# Patient Record
Sex: Female | Born: 1945 | Race: White | Hispanic: No | Smoking: Never smoker
Health system: Southern US, Community
[De-identification: ages and names within clinical notes are randomized; demographics above are authoritative.]

## PROBLEM LIST (undated history)

## (undated) DIAGNOSIS — F419 Anxiety disorder, unspecified: Secondary | ICD-10-CM

## (undated) DIAGNOSIS — L989 Disorder of the skin and subcutaneous tissue, unspecified: Secondary | ICD-10-CM

## (undated) DIAGNOSIS — E785 Hyperlipidemia, unspecified: Secondary | ICD-10-CM

## (undated) DIAGNOSIS — I10 Essential (primary) hypertension: Secondary | ICD-10-CM

## (undated) DIAGNOSIS — Z803 Family history of malignant neoplasm of breast: Secondary | ICD-10-CM

## (undated) DIAGNOSIS — H269 Unspecified cataract: Secondary | ICD-10-CM

## (undated) DIAGNOSIS — T7840XA Allergy, unspecified, initial encounter: Secondary | ICD-10-CM

## (undated) DIAGNOSIS — M1990 Unspecified osteoarthritis, unspecified site: Secondary | ICD-10-CM

## (undated) DIAGNOSIS — Z8481 Family history of carrier of genetic disease: Secondary | ICD-10-CM

## (undated) DIAGNOSIS — M5134 Other intervertebral disc degeneration, thoracic region: Secondary | ICD-10-CM

## (undated) DIAGNOSIS — K219 Gastro-esophageal reflux disease without esophagitis: Secondary | ICD-10-CM

## (undated) DIAGNOSIS — E039 Hypothyroidism, unspecified: Secondary | ICD-10-CM

## (undated) DIAGNOSIS — J683 Other acute and subacute respiratory conditions due to chemicals, gases, fumes and vapors: Secondary | ICD-10-CM

## (undated) HISTORY — DX: Disorder of the skin and subcutaneous tissue, unspecified: L98.9

## (undated) HISTORY — DX: Other acute and subacute respiratory conditions due to chemicals, gases, fumes and vapors: J68.3

## (undated) HISTORY — DX: Unspecified osteoarthritis, unspecified site: M19.90

## (undated) HISTORY — PX: BREAST BIOPSY: SHX20

## (undated) HISTORY — DX: Anxiety disorder, unspecified: F41.9

## (undated) HISTORY — DX: Allergy, unspecified, initial encounter: T78.40XA

## (undated) HISTORY — DX: Hypothyroidism, unspecified: E03.9

## (undated) HISTORY — PX: OTHER SURGICAL HISTORY: SHX169

## (undated) HISTORY — DX: Family history of malignant neoplasm of breast: Z80.3

## (undated) HISTORY — PX: ABDOMINAL HYSTERECTOMY: SHX81

## (undated) HISTORY — DX: Gastro-esophageal reflux disease without esophagitis: K21.9

## (undated) HISTORY — DX: Family history of carrier of genetic disease: Z84.81

## (undated) HISTORY — DX: Unspecified cataract: H26.9

## (undated) HISTORY — DX: Essential (primary) hypertension: I10

## (undated) HISTORY — DX: Other intervertebral disc degeneration, thoracic region: M51.34

## (undated) HISTORY — DX: Hyperlipidemia, unspecified: E78.5

## (undated) HISTORY — PX: TONSILLECTOMY: SUR1361

---

## 1998-01-02 ENCOUNTER — Other Ambulatory Visit: Admission: RE | Admit: 1998-01-02 | Discharge: 1998-01-02 | Payer: Self-pay | Admitting: Obstetrics and Gynecology

## 1998-01-20 ENCOUNTER — Encounter: Payer: Self-pay | Admitting: Obstetrics and Gynecology

## 1998-01-22 ENCOUNTER — Observation Stay (HOSPITAL_COMMUNITY): Admission: RE | Admit: 1998-01-22 | Discharge: 1998-01-23 | Payer: Self-pay | Admitting: Obstetrics and Gynecology

## 1999-01-31 ENCOUNTER — Other Ambulatory Visit: Admission: RE | Admit: 1999-01-31 | Discharge: 1999-01-31 | Payer: Self-pay | Admitting: Obstetrics and Gynecology

## 2000-02-23 ENCOUNTER — Other Ambulatory Visit: Admission: RE | Admit: 2000-02-23 | Discharge: 2000-02-23 | Payer: Self-pay | Admitting: Obstetrics and Gynecology

## 2001-04-24 ENCOUNTER — Other Ambulatory Visit: Admission: RE | Admit: 2001-04-24 | Discharge: 2001-04-24 | Payer: Self-pay | Admitting: Internal Medicine

## 2003-05-18 ENCOUNTER — Encounter: Admission: RE | Admit: 2003-05-18 | Discharge: 2003-05-18 | Payer: Self-pay | Admitting: Obstetrics and Gynecology

## 2003-07-13 ENCOUNTER — Other Ambulatory Visit: Admission: RE | Admit: 2003-07-13 | Discharge: 2003-07-13 | Payer: Self-pay | Admitting: Obstetrics and Gynecology

## 2003-12-07 ENCOUNTER — Ambulatory Visit: Payer: Self-pay | Admitting: Family Medicine

## 2003-12-21 ENCOUNTER — Ambulatory Visit: Payer: Self-pay | Admitting: Family Medicine

## 2004-07-14 ENCOUNTER — Ambulatory Visit: Payer: Self-pay | Admitting: Family Medicine

## 2004-08-04 ENCOUNTER — Encounter: Admission: RE | Admit: 2004-08-04 | Discharge: 2004-08-04 | Payer: Self-pay | Admitting: Family Medicine

## 2004-08-16 ENCOUNTER — Ambulatory Visit: Payer: Self-pay | Admitting: Family Medicine

## 2004-11-16 ENCOUNTER — Ambulatory Visit: Payer: Self-pay | Admitting: Family Medicine

## 2004-12-13 ENCOUNTER — Ambulatory Visit: Payer: Self-pay | Admitting: Family Medicine

## 2005-03-14 ENCOUNTER — Ambulatory Visit: Payer: Self-pay | Admitting: Family Medicine

## 2005-05-12 ENCOUNTER — Ambulatory Visit: Payer: Self-pay | Admitting: Family Medicine

## 2005-05-26 ENCOUNTER — Ambulatory Visit: Payer: Self-pay | Admitting: Family Medicine

## 2005-11-23 ENCOUNTER — Ambulatory Visit: Payer: Self-pay | Admitting: Family Medicine

## 2005-12-05 ENCOUNTER — Encounter: Admission: RE | Admit: 2005-12-05 | Discharge: 2005-12-05 | Payer: Self-pay | Admitting: Obstetrics and Gynecology

## 2006-04-13 ENCOUNTER — Ambulatory Visit: Payer: Self-pay | Admitting: Family Medicine

## 2006-11-30 ENCOUNTER — Ambulatory Visit: Payer: Self-pay | Admitting: Family Medicine

## 2006-12-18 ENCOUNTER — Encounter: Admission: RE | Admit: 2006-12-18 | Discharge: 2006-12-18 | Payer: Self-pay | Admitting: Obstetrics and Gynecology

## 2007-02-08 ENCOUNTER — Encounter: Payer: Self-pay | Admitting: Family Medicine

## 2007-07-16 ENCOUNTER — Encounter (INDEPENDENT_AMBULATORY_CARE_PROVIDER_SITE_OTHER): Payer: Self-pay | Admitting: *Deleted

## 2007-10-10 ENCOUNTER — Telehealth (INDEPENDENT_AMBULATORY_CARE_PROVIDER_SITE_OTHER): Payer: Self-pay | Admitting: *Deleted

## 2007-11-11 ENCOUNTER — Telehealth: Payer: Self-pay | Admitting: Family Medicine

## 2007-12-19 ENCOUNTER — Encounter: Admission: RE | Admit: 2007-12-19 | Discharge: 2007-12-19 | Payer: Self-pay | Admitting: Obstetrics and Gynecology

## 2007-12-31 ENCOUNTER — Encounter: Admission: RE | Admit: 2007-12-31 | Discharge: 2007-12-31 | Payer: Self-pay | Admitting: Obstetrics and Gynecology

## 2008-01-07 ENCOUNTER — Ambulatory Visit: Payer: Self-pay | Admitting: Family Medicine

## 2008-01-07 DIAGNOSIS — I10 Essential (primary) hypertension: Secondary | ICD-10-CM | POA: Insufficient documentation

## 2008-01-07 DIAGNOSIS — E039 Hypothyroidism, unspecified: Secondary | ICD-10-CM

## 2008-01-07 DIAGNOSIS — E669 Obesity, unspecified: Secondary | ICD-10-CM

## 2008-01-07 DIAGNOSIS — M159 Polyosteoarthritis, unspecified: Secondary | ICD-10-CM

## 2008-01-08 LAB — CONVERTED CEMR LAB
AST: 19 units/L (ref 0–37)
Albumin: 3.7 g/dL (ref 3.5–5.2)
Alkaline Phosphatase: 99 units/L (ref 39–117)
Calcium: 9.6 mg/dL (ref 8.4–10.5)
Chloride: 103 meq/L (ref 96–112)
GFR calc Af Amer: 93 mL/min
HDL: 38.7 mg/dL — ABNORMAL LOW (ref >39.0)
LDL Cholesterol: 113 mg/dL — ABNORMAL HIGH (ref 0–99)
Phosphorus: 4 mg/dL (ref 2.3–4.6)
Total Bilirubin: 0.7 mg/dL (ref 0.3–1.2)
Total CHOL/HDL Ratio: 4.8
VLDL: 35 mg/dL (ref 0–40)

## 2008-02-13 ENCOUNTER — Telehealth: Payer: Self-pay | Admitting: Family Medicine

## 2008-02-25 ENCOUNTER — Encounter: Payer: Self-pay | Admitting: Family Medicine

## 2008-04-13 ENCOUNTER — Ambulatory Visit: Payer: Self-pay | Admitting: Family Medicine

## 2008-04-13 DIAGNOSIS — E785 Hyperlipidemia, unspecified: Secondary | ICD-10-CM | POA: Insufficient documentation

## 2008-07-30 ENCOUNTER — Encounter: Payer: Self-pay | Admitting: Family Medicine

## 2008-10-28 ENCOUNTER — Ambulatory Visit: Payer: Self-pay | Admitting: Family Medicine

## 2008-11-06 ENCOUNTER — Telehealth: Payer: Self-pay | Admitting: Family Medicine

## 2008-11-30 ENCOUNTER — Ambulatory Visit: Payer: Self-pay | Admitting: Family Medicine

## 2008-11-30 DIAGNOSIS — F411 Generalized anxiety disorder: Secondary | ICD-10-CM | POA: Insufficient documentation

## 2009-01-04 ENCOUNTER — Ambulatory Visit: Payer: Self-pay | Admitting: Family Medicine

## 2009-01-05 ENCOUNTER — Telehealth: Payer: Self-pay | Admitting: Family Medicine

## 2009-01-05 LAB — CONVERTED CEMR LAB
ALT: 24 U/L
AST: 20 U/L
Albumin: 3.5 g/dL
BUN: 14 mg/dL
CO2: 32 meq/L
Calcium: 9.3 mg/dL
Chloride: 101 meq/L
Cholesterol: 176 mg/dL
Creatinine, Ser: 0.9 mg/dL
GFR calc non Af Amer: 67 mL/min
Glucose, Bld: 107 mg/dL — ABNORMAL HIGH
HDL: 39.2 mg/dL
LDL Cholesterol: 105 mg/dL — ABNORMAL HIGH
Phosphorus: 2.9 mg/dL
Potassium: 3.2 meq/L — ABNORMAL LOW
Sodium: 140 meq/L
Total CHOL/HDL Ratio: 4
Triglycerides: 158 mg/dL — ABNORMAL HIGH
VLDL: 31.6 mg/dL

## 2009-01-11 ENCOUNTER — Telehealth: Payer: Self-pay | Admitting: Family Medicine

## 2009-01-25 ENCOUNTER — Ambulatory Visit: Payer: Self-pay | Admitting: Family Medicine

## 2009-01-25 LAB — CONVERTED CEMR LAB: Potassium: 3.9 meq/L (ref 3.5–5.1)

## 2009-02-04 ENCOUNTER — Telehealth: Payer: Self-pay | Admitting: Family Medicine

## 2009-02-26 ENCOUNTER — Encounter: Payer: Self-pay | Admitting: Family Medicine

## 2009-03-17 ENCOUNTER — Ambulatory Visit: Payer: Self-pay | Admitting: Family Medicine

## 2009-05-03 ENCOUNTER — Telehealth: Payer: Self-pay | Admitting: Family Medicine

## 2009-05-10 ENCOUNTER — Telehealth (INDEPENDENT_AMBULATORY_CARE_PROVIDER_SITE_OTHER): Payer: Self-pay

## 2009-05-10 ENCOUNTER — Ambulatory Visit: Payer: Self-pay | Admitting: Orthopedic Surgery

## 2009-05-11 ENCOUNTER — Ambulatory Visit: Payer: Self-pay | Admitting: Orthopedic Surgery

## 2009-05-16 HISTORY — PX: JOINT REPLACEMENT: SHX530

## 2009-05-18 ENCOUNTER — Inpatient Hospital Stay: Payer: Self-pay | Admitting: Orthopedic Surgery

## 2009-06-02 ENCOUNTER — Telehealth: Payer: Self-pay | Admitting: Family Medicine

## 2009-06-28 ENCOUNTER — Telehealth: Payer: Self-pay | Admitting: Family Medicine

## 2009-07-13 ENCOUNTER — Ambulatory Visit: Payer: Self-pay | Admitting: Family Medicine

## 2009-07-16 LAB — CONVERTED CEMR LAB
Albumin: 3.9 g/dL (ref 3.5–5.2)
BUN: 21 mg/dL (ref 6–23)
Calcium: 9.3 mg/dL (ref 8.4–10.5)
Chloride: 106 meq/L (ref 96–112)
Glucose, Bld: 160 mg/dL — ABNORMAL HIGH (ref 70–99)
Sodium: 142 meq/L (ref 135–145)

## 2009-07-29 ENCOUNTER — Telehealth: Payer: Self-pay | Admitting: Family Medicine

## 2009-07-30 ENCOUNTER — Ambulatory Visit: Payer: Self-pay | Admitting: Family Medicine

## 2009-07-30 DIAGNOSIS — R739 Hyperglycemia, unspecified: Secondary | ICD-10-CM

## 2009-08-02 LAB — CONVERTED CEMR LAB: Hgb A1c MFr Bld: 5.8 % (ref 4.6–6.5)

## 2009-09-06 ENCOUNTER — Encounter: Admission: RE | Admit: 2009-09-06 | Discharge: 2009-09-06 | Payer: Self-pay | Admitting: Obstetrics and Gynecology

## 2009-10-27 ENCOUNTER — Ambulatory Visit: Payer: Self-pay | Admitting: Family Medicine

## 2010-01-28 ENCOUNTER — Telehealth: Payer: Self-pay | Admitting: Family Medicine

## 2010-01-28 DIAGNOSIS — J309 Allergic rhinitis, unspecified: Secondary | ICD-10-CM | POA: Insufficient documentation

## 2010-02-15 NOTE — Assessment & Plan Note (Signed)
Summary: SURG CLEARANCE... CYD   Vital Signs:  Patient profile:   65 year old female Height:      67.75 inches Weight:      241.50 pounds BMI:     37.13 Temp:     97.6 degrees F oral Pulse rate:   92 / minute Pulse rhythm:   regular BP sitting:   130 / 82  (left arm) Cuff size:   large  Vitals Entered By: Delilah Shan CMA Duncan Dull) (March 17, 2009 11:12 AM) CC: Surgical clearance   History of Present Illness: here to get medical surgical clearance   getting ready for total knee repl in may  is worn out with arthritis overall   has had mult surge in past  no anethesia problems  no medicine allergies that she knows of   bp is stable - at 130/82 - no change in medicines   has hx of reactive airways -- as a child had asthma that resolved for the most part  now she only wheezes when she is sick  very rarely uses albuterol inhaler   no heart problems that she knows of   no bleeding problems or clotting disorders  gave blood in january -- no problems with that   ? if will get lab work at the hospital   no uri in past few weeks   recent thyroid check in jan was all ok - no changes      Allergies: No Known Drug Allergies  Past History:  Past Medical History: Last updated: 04/13/2008 hypothyroid (nodules and goiter) hyperlipidemia HTN OA GERD all rhinitis reactive airways  anxiety squamous cell skin lesions basal cell skin lesions mild hyperlipidemia   [facility]N [facility] - [facility]  GI- [facility]- [facility]   Past Surgical History: Last updated: 01/07/2008 tsa 1990s hyst with oophrectomy for B9 lesion/ cyst and prolapse recocele/cystocele repain 06US thyriod- multinodular goiter 7/06 colonosc neg (re check 5 year recommended) 06 heel dexa screen nl   Family History: Last updated: 01/07/2008 father - heart problem (not CAD) mother CVA, alz, HTN, depression sister HTN   Social History: Last updated: 01/07/2008 widowed (lost  husband to esophageal cancer) 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 Eyes:  Denies blurring and eye pain. CV:  Denies chest pain or discomfort and palpitations. Resp:  Denies cough and wheezing. GI:  Denies abdominal pain, bloody stools, and change in bowel habits. GU:  Denies dysuria and urinary frequency. MS:  Complains of joint pain and stiffness; denies joint redness, joint swelling, cramps, and muscle weakness. Derm:  Denies lesion(s), poor wound healing, and rash. Neuro:  Denies numbness and tingling. Psych:  Denies anxiety and depression.  Physical Exam  General:  overweight but generally well appearing  Head:  normocephalic and atraumatic.   Eyes:  vision grossly intact, pupils equal, pupils round, and pupils reactive to light.  no conjunctival pallor, injection or icterus  Mouth:  pharynx pink and moist.   Neck:  supple with full rom and no masses or thyromegally, no JVD or carotid bruit  Chest Wall:  No deformities, masses, or tenderness noted. Lungs:  Normal respiratory effort, chest expands symmetrically. Lungs are clear to auscultation, no crackles or wheezes. Heart:  Normal rate and regular rhythm. S1 and S2 normal without gallop, murmur, click, rub or other extra sounds. no wheeze even on forced  expiration  Abdomen:  Bowel sounds positive,abdomen soft and non-tender without masses, organomegaly or hernias noted. no renal bruits  Msk:  poor rom R knee with limp  no other acute joint changes  nl rom spine gait is unassisted  Pulses:  R and L carotid,radial,femoral,dorsalis pedis and posterior tibial pulses are full and equal bilaterally Extremities:  No clubbing, cyanosis, edema, or deformity noted with normal full range of motion of all joints.   Neurologic:  sensation intact to light touch, gait normal, and DTRs symmetrical and normal.   Skin:  Intact without  suspicious lesions or rashes Cervical Nodes:  No lymphadenopathy noted Psych:  normal affect, talkative and pleasant    Impression & Recommendations:  Problem # 1:  PRE-OPERATIVE CARDIOVASCULAR EXAMINATION (ICD-V72.81) Assessment New  no restrictions anticipated for surgery HTN is well controlled and no CV problems  remote hx of reactive airways - now only with illness (has albuterol mdi needs infrequ) no problems with anethesia or clotting and no med allergies  no acute change on EKG   Orders: EKG w/ Interpretation (93000)  Complete Medication List: 1)  Ny-tannic 9-25 Mg Tabs (Chlorphen tan-phenyleph tan) .... One by mouth two times a day 2)  Nexium 40 Mg Cpdr (Esomeprazole magnesium) .... One by mouth daily 3)  Triamterene-hctz 37.5-25 Mg Tabs (Triamterene-hctz) .... 2 tabs by mouth daily 4)  Tylenol Extra Strength 500 Mg Tabs (Acetaminophen) .... As needed 5)  Albuterol 90 Mcg/act Aers (Albuterol) .... 2 puffs q 4 hrs prn 6)  Flunisolide 0.025 % Soln (Flunisolide) .... One spray each nostril 1-2 times a day 7)  Calan Sr 240 Mg Tbcr (Verapamil hcl) .... Take one by mouth twice a day 8)  Synthroid 88 Mcg Tabs (Levothyroxine sodium) .... One tab by mouth daily 9)  Vivelle Dot Patch  .... Change patch 2 times weekly 10)  Glucosamine-chondroitin 750-600 Mg Tabs (Glucosamine-chondroitin) .... One by mouth two times a day 11)  Celebrex 200 Mg Caps (Celecoxib) .Marland Kitchen.. 1 by mouth once daily with food as needed pain 12)  Soma 350 Mg Tabs (Carisoprodol) .Marland Kitchen.. 1 by mouth three times a day as needed for muscle spasm  Other Orders: Prescription Created Electronically [phone])  Patient Instructions: 1)  no restrictions for upcoming surgery 2)  I will send notes to Dr Kennith Center  3)  good luck with everything  Prescriptions: FLUNISOLIDE 0.025 %  SOLN (FLUNISOLIDE) one spray each nostril 1-2 times a day  #25 [place] x 12   Entered by:   Delilah Shan CMA (AAMA)   Authorized by:   Judith Part  MD   Signed by:   Delilah Shan CMA (AAMA) on 03/17/2009   Method used:   [facility]        [facility] [facility]ail)       [address]       [place], [place]  [address]       Ph: [phone] or [phone]       Fax: [phone]   RxID:   [id number] ALBUTEROL 90 MCG/ACT  AERS (ALBUTEROL) 2 puffs q 4 hrs prn  #17 Gram x 1   Entered by:   Delilah Shan CMA (AAMA)   Authorized by:   Judith Part MD   Signed by:   Delilah Shan CMA (AAMA) on 03/17/2009   Method used:   Electronically [facility]        [facility] [phone] (retail)       [phone]       [place], [place]  [address]       Ph: [phone] or [phone]       Fax: [phone]   RxID:   [id number]   Current Allergies (reviewed today): No known allergies    EKG  Procedure date:  03/17/2009  Findings:      NSR with rate of 61 and no acute changes   computer read as poss anterior infarct-- I do not see this   Appended Document: SURG CLEARANCE... CYD Office visit note and EKG faxed to [facility] at [phone], attention Joni.

## 2010-02-15 NOTE — Progress Notes (Signed)
Summary: Rx R-Tanna  Phone Note Refill Request Call back at [phone] Message from:  [facility] on February 04, 2009 3:09 PM  Refills Requested: Medication #1:  NY-TANNIC 9-25 MG TABS one by mouth two times a day Received faxed refill request, please advise   Method Requested: Electronic Initial call taken by: Linde Gillis CMA Duncan Dull),  February 04, 2009 3:10 PM  Follow-up for Phone Call        px written on EMR for call in  Follow-up by: Judith Part MD,  February 04, 2009 3:15 PM  Additional Follow-up for Phone Call Additional follow up Details #1::        [facility] Additional Follow-up by: Lowella Petties CMA,  February 04, 2009 3:27 PM    New/Updated Medications: NY-TANNIC 9-25 MG TABS (CHLORPHEN TAN-PHENYLEPH TAN) one by mouth two times a day Prescriptions: NY-TANNIC 9-25 MG TABS (CHLORPHEN TAN-PHENYLEPH TAN) one by mouth two times a day  #60 x 5   Entered and Authorized by:   Judith Part MD   Signed by:   Judith Part MD on 02/04/2009   Method used:   Telephoned to ...       [facility] (retail)       [address]       [place], [place]  [address]       Ph: [phone] or [phone]       Fax: [phone]   RxID:   [id number]

## 2010-02-15 NOTE — Assessment & Plan Note (Signed)
Summary: FLU SHOT/CLE  Nurse Visit   Allergies: 1)  ! * Sporins(Neoporin, and Polysporin) 2)  ! * ? Tape (Not Sure What Type)  Orders Added: 1)  Admin 1st Vaccine [90471] 2)  Flu Vaccine 54yrs + [84132]  Flu Vaccine Consent Questions     Do you have a history of severe allergic reactions to this vaccine? no    Any prior history of allergic reactions to egg and/or gelatin? no    Do you have a sensitivity to the preservative Thimersol? no    Do you have a past history of Guillan-Barre Syndrome? no    Do you currently have an acute febrile illness? no    Have you ever had a severe reaction to latex? no    Vaccine information given and explained to patient? yes    Are you currently pregnant? no    Lot Number:[id number]   Exp Date:07/16/2010   Site Given  Left Deltoid IM

## 2010-02-15 NOTE — Progress Notes (Signed)
Summary: potassium   Phone Note Call from Patient Call back at Home Phone [phone]   Caller: Patient Call For: Judith Part MD Summary of Call: Patient says that she was given potassium suppliment after having her knee surgery. Dr. Kennith Center prescribed it to her, but will not refill it. He told her that she would have to go to PCP for refills. She is asking for a rx for potassium be sent [name]. She has an appt at end of June to see you, but does not want to wait until then, she sayst that she has leg cramps when she doesn't take it. Please advise.  Initial call taken by: Melody Comas,  Jun 02, 2009 1:23 PM  Follow-up for Phone Call        that is no problem- but I need to know name of med and dose so I can px   can give her #90 with 1 refil thanks  Follow-up by: Judith Part MD,  Jun 02, 2009 1:45 PM  Additional Follow-up for Phone Call Additional follow up Details #1::        Patient notified as instructed by telephone. Pt takes Klor Con M33meq taking one tablet daily. Pt wants sent to pharmacy today because she is completely out.Lewanda Rife LPN  Jun 02, 2009 4:47 PM   px written on EMR for call in  Additional Follow-up by: Judith Part MD,  Jun 02, 2009 5:05 PM    Additional Follow-up for Phone Call Additional follow up Details #2::    Patient notified as instructed by telephone. [facility] as instructed. Lewanda Rife LPN  Jun 02, 2009 5:15 PM   New/Updated Medications: KLOR-CON M20 20 MEQ CR-TABS (POTASSIUM CHLORIDE CRYS CR) 1 by mouth once daily Prescriptions: KLOR-CON M20 20 MEQ CR-TABS (POTASSIUM CHLORIDE CRYS CR) 1 by mouth once daily  #90 x 1   Entered and Authorized by:   Judith Part MD   Signed by:   Lewanda Rife LPN on 66/44/0347   Method used:   Telephoned to ...       [facility] (retail)       [address]       [place], [place]  [address]       Ph: [phone] or [phone]       Fax:  [phone]   RxID:   [id number]

## 2010-02-15 NOTE — Progress Notes (Signed)
Summary: Needs clearance  Phone Note From Other Clinic Call back at [phone]   Caller: Joni//Dr. Kennith Center' office Call For: Dr. Milinda Antis Summary of Call: Needs medical clearance faxed to their office fax # is [phone] Initial call taken by: Sydell Axon LPN,  May 10, 2009 2:38 PM  Follow-up for Phone Call        Spoke with Delaney Meigs at [facility] and she has Dr Royden Purl office note for surgical clearance.Lewanda Rife LPN  May 10, 2009 2:58 PM

## 2010-02-15 NOTE — Letter (Signed)
Summary: CMN for [facility]  CMN for [facility]   Imported By: Lanelle Bal 08/04/2009 13:26:16  _____________________________________________________________________  External Attachment:    Type:   Image     Comment:   External Document

## 2010-02-15 NOTE — Progress Notes (Signed)
Summary: Celebrex  Phone Note Refill Request Message from:  [facility] on June 28, 2009 12:17 PM  Refills Requested: Medication #1:  CELEBREX 200 MG CAPS 1 by mouth once daily with food as needed pain [facility]   Last Fill Date:  No date sent   Pharmacy Phone:  [phone]   Method Requested: Electronic Initial call taken by: Delilah Shan CMA Duncan Dull),  June 28, 2009 12:17 PM  Follow-up for Phone Call        px written on EMR for call in  Follow-up by: Judith Part MD,  June 28, 2009 1:19 PM    Prescriptions: CELEBREX 200 MG CAPS (CELECOXIB) 1 by mouth once daily with food as needed pain  #30 x 11   Entered by:   Delilah Shan CMA (AAMA)   Authorized by:   Judith Part MD   Signed by:   Delilah Shan CMA (AAMA) on 06/28/2009   Method used:   Electronically to        [facility] (retail)       [address]       [place], [place]  [address]       Ph: [phone] or [phone]       Fax: [phone]   RxID:   [id number]

## 2010-02-15 NOTE — Assessment & Plan Note (Signed)
Summary: JAN FOLLOW UP/[facility]   Vital Signs:  Patient profile:   65 year old female Weight:      241 pounds Temp:     97.8 degrees F oral Pulse rate:   76 / minute BP supine:   150 / 82 BP sitting:   128 / 80  Serial Vital Signs/Assessments:  Time      Position  BP       Pulse  Resp  Temp     By                     128/80                         Judith Part MD   History of Present Illness: here for f/u of mult health problems finally got back pain better  hip hurts now-- had injection -- needs another one for bursitis   is still walking -- putting off knee repl until abs necessary   lipids good wit trig 158/ HDL 39 and LDL 105 in dec  K was low at 3.2  started on kcl daily and this needs re check -- decided to start eating bananas   anxiety-- was better -- took buspar for a while and not now -- keeps it as needed   bp is up today 150/82-- does not check it at home  thinks eating out too much and too much salt  too much christmas eating   Allergies: No Known Drug Allergies  Past History:  Past Medical History: Last updated: 04/13/2008 hypothyroid (nodules and goiter) hyperlipidemia HTN OA GERD all rhinitis reactive airways  anxiety squamous cell skin lesions basal cell skin lesions mild hyperlipidemia   [facility]N [facility] - [facility]  GI- [facility]- [facility]   Past Surgical History: Last updated: 01/07/2008 tsa 1990s hyst with oophrectomy for B9 lesion/ cyst and prolapse recocele/cystocele repain 06US thyriod- multinodular goiter 7/06 colonosc neg (re check 5 year recommended) 06 heel dexa screen nl   Family History: Last updated: 01/07/2008 father - heart problem (not CAD) mother CVA, alz, HTN, depression sister HTN   Social History: Last updated: 01/07/2008 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 Eyes:  Denies blurring and eye pain. CV:  Denies chest pain or discomfort, lightheadness, palpitations, and shortness of breath with exertion. Resp:  Denies cough and wheezing. GI:  Denies abdominal pain, bloody stools, and change in bowel habits. MS:  Complains of joint pain and stiffness; denies muscle aches. Derm:  Denies itching, lesion(s), poor wound healing, and rash. Neuro:  Denies numbness and tingling. Psych:  mood is overall better- less anxioius. Endo:  Denies cold intolerance, excessive thirst, excessive urination, and heat intolerance. Heme:  Denies abnormal bruising and bleeding.  Physical Exam  General:  overweight but generally well appearing  Head:  normocephalic, atraumatic, and no abnormalities observed.   Eyes:  vision grossly intact, pupils equal, pupils round, and pupils reactive to light.  no conjunctival pallor, injection or icterus  Mouth:  pharynx pink and moist.   Neck:  No deformities, masses, or tenderness noted. nl rom- no bony tenderness some bilat trap tenderness  Lungs:  Normal respiratory effort, chest expands symmetrically. Lungs are clear to auscultation, no crackles or wheezes. Heart:  Normal rate  and regular rhythm. S1 and S2 normal without gallop, murmur, click, rub or other extra sounds. Msk:  No deformity or scoliosis noted of thoracic or lumbar spine.   Pulses:  R and L carotid,radial,femoral,dorsalis pedis and posterior tibial pulses are full and equal bilaterally Extremities:  No clubbing, cyanosis, edema, or deformity noted with normal full range of motion of all joints.   Neurologic:  sensation intact to light touch, gait normal, and DTRs symmetrical and normal.   Skin:  Intact without suspicious lesions or rashes Cervical Nodes:  No lymphadenopathy noted Psych:  normal affect, talkative and pleasant    Impression & Recommendations:  Problem # 1:  HYPOKALEMIA (ICD-276.8) Assessment New pt eating more K in  diet -- will check this and update with whether she will need px  no cramps or other symptoms Orders: Venipuncture (40981) TLB-Potassium (K+) (84132-K)  Problem # 2:  HYPERTENSION, BENIGN (ICD-401.1) Assessment: Unchanged  bp much better on second check with large cuff today disc lifestyle change in detail -- will work on wt loss  lab today f/u 6 mo  Her updated medication list for this problem includes:    Triamterene-hctz 37.5-25 Mg Tabs (Triamterene-hctz) .Marland Kitchen... 2 tabs by mouth daily    Calan Sr 240 Mg Tbcr (Verapamil hcl) .Marland Kitchen... Take one by mouth twice a day  BP today: 128/80 Prior BP: 142/86 (11/30/2008)  Labs Reviewed: K+: 3.2 (01/04/2009) Creat: : 0.9 (01/04/2009)   Chol: 176 (01/04/2009)   HDL: 39.20 (01/04/2009)   LDL: 105 (01/04/2009)   TG: 158.0 (01/04/2009)  Problem # 3:  HYPERLIPIDEMIA (ICD-272.4) Assessment: Unchanged fairly well controlled with diet - will continue to monitor  Labs Reviewed: SGOT: 20 (01/04/2009)   SGPT: 24 (01/04/2009)   HDL:39.20 (01/04/2009), 38.7 (01/07/2008)  LDL:105 (01/04/2009), 113 (01/07/2008)  Chol:176 (01/04/2009), 187 (01/07/2008)  Trig:158.0 (01/04/2009), 175 (01/07/2008)  Complete Medication List: 1)  Ny-tannic 9-25 Mg Tabs (Chlorphen tan-phenyleph tan) .... One by mouth two times a day 2)  Nexium 40 Mg Cpdr (Esomeprazole magnesium) .... One by mouth daily 3)  Triamterene-hctz 37.5-25 Mg Tabs (Triamterene-hctz) .... 2 tabs by mouth daily 4)  Tylenol Extra Strength 500 Mg Tabs (Acetaminophen) .... As needed 5)  Albuterol 90 Mcg/act Aers (Albuterol) .... 2 puffs q 4 hrs prn 6)  Flunisolide 0.025 % Soln (Flunisolide) .... One spray each nostril 1-2 times a day 7)  Calan Sr 240 Mg Tbcr (Verapamil hcl) .... Take one by mouth twice a day 8)  Synthroid 88 Mcg Tabs (Levothyroxine sodium) .... One tab by mouth daily 9)  Vivelle Dot Patch  .... Change patch 2 times weekly 10)  Glucosamine-chondroitin 750-600 Mg Tabs (Glucosamine-chondroitin)  .... One by mouth two times a day 11)  Celebrex 200 Mg Caps (Celecoxib) .Marland Kitchen.. 1 by mouth once daily with food as needed pain 12)  Buspirone Hcl 15 Mg Tabs (Buspirone hcl) .... 1/2 by mouth up to two times a day for anxiety 13)  Soma 350 Mg Tabs (Carisoprodol) .Marland Kitchen.. 1 by mouth three times a day as needed for muscle spasm  Patient Instructions: 1)  checking potassium today -- will advise you whether you need to start px potassium or not 2)  really work on healthy diet and exercise for weight loss  3)  watch salt in diet as well  4)  schedule follow up in  6months   Prevention & Chronic Care Immunizations   Influenza vaccine: Fluvax 3+  (10/28/2008)    Tetanus booster: Not documented  Pneumococcal vaccine: Not documented    H. zoster vaccine: Not documented  Colorectal Screening   Hemoccult: Not documented    Colonoscopy: Not documented  Other Screening   Pap smear: Not documented    Mammogram: Not documented    DXA bone density scan: Not documented   Smoking status: Not documented  Lipids   Total Cholesterol: 176  (01/04/2009)   LDL: 105  (01/04/2009)   LDL Direct: Not documented   HDL: 39.20  (01/04/2009)   Triglycerides: 158.0  (01/04/2009)    SGOT (AST): 20  (01/04/2009)   SGPT (ALT): 24  (01/04/2009)   Alkaline phosphatase: 99  (01/07/2008)   Total bilirubin: 0.7  (01/07/2008)  Hypertension   Last Blood Pressure: 128 / 80  (01/25/2009)   Serum creatinine: 0.9  (01/04/2009)   Serum potassium 3.2  (01/04/2009)  Self-Management Support :    Hypertension self-management support: Not documented    Lipid self-management support: Not documented

## 2010-02-15 NOTE — Progress Notes (Signed)
Summary: Celebrex  Phone Note Refill Request Message from:  [facility] on May 03, 2009 9:20 AM  Refills Requested: Medication #1:  CELEBREX 200 MG CAPS 1 by mouth once daily with food as needed pain [facility] [facility]   Last Fill Date:  No date sent   Pharmacy Phone:  [phone]  Patient was in for surgical clearance recently.......? RF.   Method Requested: Electronic Initial call taken by: Delilah Shan CMA Duncan Dull),  May 03, 2009 9:21 AM    Prescriptions: CELEBREX 200 MG CAPS (CELECOXIB) 1 by mouth once daily with food as needed pain  #30 x 0   Entered and Authorized by:   Shaune Leeks MD   Signed by:   Shaune Leeks MD on 05/03/2009   Method used:   Electronically to        [facility]  [place] (retail)       [address]       [place], [place]  [address]       Ph: [phone] or [phone]       Fax: [phone]   RxID:   [id number]

## 2010-02-15 NOTE — Progress Notes (Signed)
Summary: [facility] written confirmation verbal order for walker  Phone Note Other Incoming Call back at fax # [phone]   Caller: [facility] Summary of Call: [facility] sent written confirmation form for verbal orders for walker with wheels. Form is on your shelf in the in box. Initial call taken by: Lewanda Rife LPN,  July 29, 2009 2:48 PM  Follow-up for Phone Call        form done and in nurse in box  Follow-up by: Judith Part MD,  July 30, 2009 8:10 AM  Additional Follow-up for Phone Call Additional follow up Details #1::        completed form faxed to [phone] as instructed and then sent form to be scanned.Lewanda Rife LPN  July 30, 2009 8:19 AM

## 2010-02-17 NOTE — Progress Notes (Signed)
Summary: prior Berkley Harvey is needed for R-tanna/ denied  Phone Note From Pharmacy   Caller: [facility] [place] [facility] Summary of Call: Prior Berkley Harvey is needed for R-tanna, form is on your shelf. Initial call taken by: Lowella Petties CMA, AAMA,  January 28, 2010 9:09 AM  Follow-up for Phone Call        form done and in nurse in box  Follow-up by: Judith Part MD,  January 31, 2010 8:17 AM  Additional Follow-up for Phone Call Additional follow up Details #1::        Form faxed.           Lowella Petties CMA, AAMA  January 31, 2010 9:19 AM  Prior auth denied for [employer], letter is on your shelf.  please let pt know that her R tanna was denied for coverage since it is not "part D eligable" does she want to pay out of pocket and have me keep px it ?  she can check pricing with her pharmacy-thanks --MT Additional Follow-up by: Lowella Petties CMA, AAMA,  January 31, 2010 4:55 PM  New Problems: ALLERGIC RHINITIS (ICD-477.9)   Additional Follow-up for Phone Call Additional follow up Details #2::    Patient says she has had to pay for it out of pocket. She doesn't want to get back to where she was before she started it because it has done a great job. She asked if there was any way to get it approved and I advised that since it was not on her formulary-probably not. She said she may wait until her follow up with you and then try the prior auth again. I advised that it may or may not work. We would just have to see. She verbalized understanding. For now she asks that it still be prescribed.  ok- thanks -- she has refils until june I think Follow-up by: Janee Morn CMA Duncan Dull),  February 01, 2010 9:10 AM  New Problems: ALLERGIC RHINITIS (ICD-477.9)

## 2010-02-17 NOTE — Assessment & Plan Note (Signed)
Summary: 6 MONTH FOLLOWUPR/BH   Vital Signs:  Patient profile:   65 year old female Height:      67.75 inches Weight:      228.75 pounds BMI:     35.17 Temp:     98.4 degrees F oral Pulse rate:   84 / minute Pulse rhythm:   regular BP sitting:   138 / 82  (left arm) Cuff size:   large  Vitals Entered By: Lewanda Rife LPN (July 13, 2009 9:27 AM)  Serial Vital Signs/Assessments:  Time      Position  BP       Pulse  Resp  Temp     By                     132/80                         Judith Part MD  CC: six month f/u   History of Present Illness: here for f/u of HTN and lipids and hypothyroid   wt isdown 13 lb  got so sick she could not eat much -- now appetite coming back  gets full faster and eating less  also eating out less   has not been fully released to exercise- but doing some walking and finished therapy - after her knee replacement in may   needs K check  was having some cramps  was on K in the hospital -- and in pre op   bp 138/82 first check today  Last Lipid ProfileCholesterol: 176 (01/04/2009 9:28:19 AM)HDL:  39.20 (01/04/2009 9:28:19 AM)LDL:  105 (01/04/2009 9:28:19 AM)Triglycerides:  Last Liver profileSGOT:  20 (01/04/2009 9:28:19 AM)SPGT:  24 (01/04/2009 9:28:19 AM)T. Bili:  0.7 (01/07/2008 11:22:00 AM)Alk Phos:  99 (01/07/2008 11:22:00 AM)  that was with diet alone   Allergies (verified): 1)  ! * Sporins(Neoporin, and Polysporin) 2)  ! * ? Tape (Not Sure What Type)  Past History:  Past Medical History: Last updated: 06/03/2009 hypothyroid (nodules and goiter) hyperlipidemia HTN OA-- knee replacement  GERD all rhinitis reactive airways  anxiety squamous cell skin lesions basal cell skin lesions mild hyperlipidemia   [facility]N [facility] - [facility]  GI- [facility]- [facility]   Past Surgical History: Last updated: 06/03/2009 tsa 1990s hyst with oophrectomy for B9 lesion/ cyst and prolapse recocele/cystocele  repain 06US thyriod- multinodular goiter 7/06 colonosc neg (re check 5 year recommended) 06 heel dexa screen nl  5/11 L total knee replacement  Family History: Last updated: 01/07/2008 father - heart problem (not CAD) mother CVA, alz, HTN, depression sister HTN   Social History: Last updated: 01/07/2008 widowed (lost husband to esophageal cancer) 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 Eyes:  Denies blurring and eye irritation. CV:  Denies chest pain or discomfort, lightheadness, and palpitations. Resp:  Denies cough and wheezing. GI:  Denies abdominal pain, change in bowel habits, and indigestion. MS:  Complains of joint pain and stiffness; denies joint redness, joint swelling, and muscle aches. Derm:  Denies itching, lesion(s), poor wound healing, and rash. Neuro:  Denies numbness and tingling. Psych:  Denies anxiety and depression. Endo:  Denies excessive thirst and excessive urination. Heme:  Denies abnormal bruising and bleeding.  Physical Exam  General:  overweight but generally well appearing wt loss noted  Head:  normocephalic,  atraumatic, and no abnormalities observed.   Eyes:  vision grossly intact, pupils equal, pupils round, and pupils reactive to light.  no conjunctival pallor, injection or icterus  Mouth:  pharynx pink and moist.   Neck:  supple with full rom and no masses or thyromegally, no JVD or carotid bruit  Lungs:  Normal respiratory effort, chest expands symmetrically. Lungs are clear to auscultation, no crackles or wheezes. Heart:  Normal rate and regular rhythm. S1 and S2 normal without gallop, murmur, click, rub or other extra sounds. no wheeze even on forced expiration  Abdomen:  no renal  bruits  Msk:  healing incision L knee- with good rom   Pulses:  R and L carotid,radial,femoral,dorsalis pedis and posterior tibial pulses are full and equal  bilaterally Extremities:  No clubbing, cyanosis, edema, or deformity noted with normal full range of motion of all joints.   Neurologic:  sensation intact to light touch, gait normal, and DTRs symmetrical and normal.   Skin:  Intact without suspicious lesions or rashes Cervical Nodes:  No lymphadenopathy noted Psych:  normal affect, talkative and pleasant    Impression & Recommendations:  Problem # 1:  HYPERTENSION, BENIGN (ICD-401.1) Assessment Improved  good control - better with wt loss no change in meds meds refilled lab today f/u 6 mo  enc further wt loss  Her updated medication list for this problem includes:    Triamterene-hctz 37.5-25 Mg Tabs (Triamterene-hctz) .Marland Kitchen... 2 tabs by mouth daily    Calan Sr 240 Mg Tbcr (Verapamil hcl) .Marland Kitchen... Take one by mouth twice a day  Orders: Venipuncture ([address]) TLB-Renal Function Panel (80069-RENAL)  BP today: 138/82-- re check 130/80 Prior BP: 130/82 (03/17/2009)  Labs Reviewed: K+: 3.9 (01/25/2009) Creat: : 0.9 (01/04/2009)   Chol: 176 (01/04/2009)   HDL: 39.20 (01/04/2009)   LDL: 105 (01/04/2009)   TG: 158.0 (01/04/2009)  Problem # 2:  HYPOTHYROIDISM (ICD-244.9) Assessment: Unchanged per pt stable at endo this winter - no clinical changes  Her updated medication list for this problem includes:    Synthroid 88 Mcg Tabs (Levothyroxine sodium) ..... One tab by mouth daily  Complete Medication List: 1)  Ny-tannic 9-25 Mg Tabs (Chlorphen tan-phenyleph tan) .... One by mouth two times a day 2)  Nexium 40 Mg Cpdr (Esomeprazole magnesium) .... One by mouth daily 3)  Triamterene-hctz 37.5-25 Mg Tabs (Triamterene-hctz) .... 2 tabs by mouth daily 4)  Tylenol Extra Strength 500 Mg Tabs (Acetaminophen) .... As needed 5)  Albuterol 90 Mcg/act Aers (Albuterol) .... 2 puffs every 4 hrs as needed 6)  Calan Sr 240 Mg Tbcr (Verapamil hcl) .... Take one by mouth twice a day 7)  Synthroid 88 Mcg Tabs (Levothyroxine sodium) .... One tab by mouth  daily 8)  Vivelle Dot Patch  .... Change patch 2 times weekly 9)  Glucosamine-chondroitin 750-600 Mg Tabs (Glucosamine-chondroitin) .... One by mouth two times a day 10)  Celebrex 200 Mg Caps (Celecoxib) .Marland Kitchen.. 1 by mouth once daily with food as needed pain 11)  Soma 350 Mg Tabs (Carisoprodol) .Marland Kitchen.. 1 by mouth three times a day as needed for muscle spasm 12)  Klor-con M20 20 Meq Cr-tabs (Potassium chloride crys cr) .Marland Kitchen.. 1 by mouth once daily 13)  Fluticasone Propionate 50 Mcg/act Susp (Fluticasone propionate) .... One spray each nostril twice a day 14)  Multivitamins Tabs (Multiple vitamin) .... Take 1 tablet by mouth once a day  Patient Instructions: 1)  blood pressure is good  2)  work on continued weight  loss  3)  labs today  Prescriptions: NY-TANNIC 9-25 MG TABS (CHLORPHEN TAN-PHENYLEPH TAN) one by mouth two times a day  #60 x 11   Entered and Authorized by:   Judith Part MD   Signed by:   Judith Part MD on 07/13/2009   Method used:   [facility]        [facility]       [address]       [place], [place]  [address]       Ph: [phone] or [phone]       Fax: [phone]   RxID:   [id number] CALAN SR 240 MG  TBCR (VERAPAMIL HCL) take one by mouth twice a day  #60 x 11   Entered and Authorized by:   Judith Part MD   Signed by:   Judith Part MD on 07/13/2009   Method used:   [facility]        [facility] (retail)       [address]       [place], [place]  [address]       Ph: [phone] or [phone]       Fax: [phone]   RxID:   [id number] SOMA 350 MG TABS (CARISOPRODOL) 1 by mouth three times a day as needed for muscle spasm  #30 x 1   Entered and Authorized by:   Judith Part MD   Signed by:   Judith Part MD on 07/13/2009   Method used:   Print then Give to Patient   RxID:   [id number]   Current Allergies (reviewed today): ! * SPORINS(NEOPORIN, AND POLYSPORIN) ! *  ? TAPE (NOT SURE WHAT TYPE)

## 2010-03-11 ENCOUNTER — Encounter: Payer: Self-pay | Admitting: Family Medicine

## 2010-03-21 ENCOUNTER — Telehealth: Payer: Self-pay | Admitting: Family Medicine

## 2010-03-24 NOTE — Letter (Signed)
Summary: Dr.Sam Morayati Office Note  Dr.Sam Morayati Office Note   Imported By: Beau Fanny 03/15/2010 11:07:47  _____________________________________________________________________  External Attachment:    Type:   Image     Comment:   External Document

## 2010-03-29 NOTE — Progress Notes (Signed)
Summary: Fluticasone prop spray  Phone Note Refill Request Call back at [phone] Message from:  [name] on March 21, 2010 11:51 AM  Refills Requested: Medication #1:  FLUTICASONE PROPIONATE 50 MCG/ACT SUSP one spray each nostril twice a day   Last Refilled: 02/19/2010 [name] request refill on Fluticasone Prop spray Use 1 spary in each nostril 1/2 times daily Last refilled 02/19/10 but according to med list cannot find where Dr Milinda Antis has filled.Please advise.    Method Requested: Electronic Initial call taken by: Lewanda Rife LPN,  March 21, 2010 11:52 AM  Follow-up for Phone Call        px written on EMR for call in  Follow-up by: Judith Part MD,  March 21, 2010 12:43 PM  Additional Follow-up for Phone Call Additional follow up Details #1::        Med sent [facility] to [facility] as instructed.Lewanda Rife LPN  March 20, 1189 3:59 PM     Prescriptions: FLUTICASONE PROPIONATE 50 MCG/ACT SUSP (FLUTICASONE PROPIONATE) one spray each nostril twice a day  #1 mdi x 11   Entered by:   Lewanda Rife LPN   Authorized by:   Judith Part MD   Signed by:   Lewanda Rife LPN on 47/82/9562   Method used:   [facility]ronically to        [facility] (retail)       [address]       [place], [place]  [address]       Ph: [phone] or [phone]       Fax: [phone]   RxID:   [id number] FLUTICASONE PROPIONATE 50 MCG/ACT SUSP (FLUTICASONE PROPIONATE) one spray each nostril twice a day  #1 mdi x 11   Entered and Authorized by:   Judith Part MD   Signed by:   Judith Part MD on 03/21/2010   Method used:   Telephoned to ...       [facility] (retail)       [address]       [place], [place]  [address]       Ph: [phone] or [phone]       Fax: [phone]   RxID:   [id number]

## 2010-05-18 ENCOUNTER — Other Ambulatory Visit: Payer: Self-pay | Admitting: *Deleted

## 2010-05-18 MED ORDER — ESOMEPRAZOLE MAGNESIUM 40 MG PO CPDR: 40.0000 mg | DELAYED_RELEASE_CAPSULE | Freq: Every day | ORAL | Status: DC

## 2010-05-30 ENCOUNTER — Other Ambulatory Visit: Payer: Self-pay | Admitting: *Deleted

## 2010-05-30 MED ORDER — POTASSIUM CHLORIDE CRYS ER 20 MEQ PO TBCR: 20.0000 meq | EXTENDED_RELEASE_TABLET | Freq: Every day | ORAL | Status: DC

## 2010-06-17 ENCOUNTER — Other Ambulatory Visit: Payer: Self-pay

## 2010-06-17 MED ORDER — TRIAMTERENE-HCTZ 37.5-25 MG PO CAPS: 2.0000 | ORAL_CAPSULE | Freq: Every day | ORAL | Status: DC

## 2010-06-17 NOTE — Telephone Encounter (Signed)
Received fax request from [facility] for Celebrex 200mg . Pt last seen 07/13/09.Please advise.

## 2010-06-19 NOTE — Telephone Encounter (Signed)
See prev note

## 2010-06-19 NOTE — Telephone Encounter (Signed)
Please ask her if she is still taking this --- daily or prn?  If she is can have 30 with 5 ref  thanks

## 2010-06-20 ENCOUNTER — Other Ambulatory Visit: Payer: Self-pay | Admitting: *Deleted

## 2010-06-20 MED ORDER — CELECOXIB 200 MG PO CAPS: ORAL_CAPSULE | ORAL | Status: DC

## 2010-06-20 MED ORDER — CELECOXIB 200 MG PO CAPS: 200.0000 mg | ORAL_CAPSULE | Freq: Every day | ORAL | Status: DC | PRN

## 2010-06-20 MED ORDER — TRIAMTERENE-HCTZ 37.5-25 MG PO CAPS: 2.0000 | ORAL_CAPSULE | Freq: Every day | ORAL | Status: DC

## 2010-06-20 NOTE — Telephone Encounter (Signed)
Pt stated she is taking daily for pain. Dr Milinda Antis said can refill with #30 and 5 add' refills sent to [name] as instructed.

## 2010-06-20 NOTE — Telephone Encounter (Signed)
She does need appt with me if she does not already have one  Px written for call in

## 2010-06-24 ENCOUNTER — Telehealth: Payer: Self-pay | Admitting: *Deleted

## 2010-06-24 NOTE — Telephone Encounter (Signed)
Please let her know those are options for her

## 2010-06-24 NOTE — Telephone Encounter (Signed)
Patient notified as instructed by telephone. 

## 2010-06-24 NOTE — Telephone Encounter (Signed)
Pharmacist said OTC allegra,claritin,zyrtec or hydroxyzine.

## 2010-06-24 NOTE — Telephone Encounter (Signed)
Ask pharmacist what they recommend that they stock-thanks

## 2010-06-24 NOTE — Telephone Encounter (Signed)
Pharmacy is asking if there is something comparible to R-tanna that you can prescribe.  R-tanna is no longer covered by pt's insurance.

## 2010-06-27 ENCOUNTER — Encounter: Payer: Self-pay | Admitting: Family Medicine

## 2010-06-28 ENCOUNTER — Encounter: Payer: Self-pay | Admitting: Family Medicine

## 2010-06-28 ENCOUNTER — Ambulatory Visit (INDEPENDENT_AMBULATORY_CARE_PROVIDER_SITE_OTHER): Payer: No Typology Code available for payment source | Admitting: Family Medicine

## 2010-06-28 DIAGNOSIS — I10 Essential (primary) hypertension: Secondary | ICD-10-CM

## 2010-06-28 DIAGNOSIS — H9209 Otalgia, unspecified ear: Secondary | ICD-10-CM

## 2010-06-28 DIAGNOSIS — D649 Anemia, unspecified: Secondary | ICD-10-CM

## 2010-06-28 DIAGNOSIS — E039 Hypothyroidism, unspecified: Secondary | ICD-10-CM

## 2010-06-28 MED ORDER — TRIAMTERENE-HCTZ 37.5-25 MG PO CAPS: 2.0000 | ORAL_CAPSULE | Freq: Every day | ORAL | Status: DC

## 2010-06-28 NOTE — Assessment & Plan Note (Signed)
R ear discomfort and also throbbing sound - ongoing-- nl exam  ? tinnitis Ref to ENT

## 2010-06-28 NOTE — Assessment & Plan Note (Signed)
Per pt - endo f/u -- all is ok and no changes or symptoms

## 2010-06-28 NOTE — Assessment & Plan Note (Signed)
Good control refil maxzide Disc lifestyle  Did cut her K  Lab today

## 2010-06-28 NOTE — Assessment & Plan Note (Signed)
Found last few times don blood Check today Pt will sched own colonosc - is due for for fam hx

## 2010-06-28 NOTE — Patient Instructions (Signed)
Go ahead and make your colonoscopy appt since you are anemic  Keep tick bite clean with antibacterial soap and water  If increased redness or joint pain or fever/rash/ sore throat - call me asap  We will do ENT referral at check out  Labs today

## 2010-06-28 NOTE — Progress Notes (Signed)
Subjective:    Patient ID: Amber Miles, female    DOB: September 29, 1945, 65 y.o.   MRN: [id number]  HPI Foot was itching on sat night  R Was tick in between 3rd/ 4th toes  A little red- mostly skin colored   (brought tick - looks to be size of dog tick) ? If got it all out  Left a knot on top of foot - base of toe has gone down a bit  Pain over top of foot for a while  Is sore to touch  Also her R hip hurts  No fever  No rash  No sore throat  No headache  No other tick bites   Her dog got lyme disease 3-4 y ago   Still has throbbing sound in her R ear - wants ENT ref  ? Tinnitus or other    Cut her K in 1/2 -- this week - needs level checked Feels fine No cramps   Needs refil of maxzide - bp is good  No cramping No cp or ha or palpitations  Denied blood donation due to anemia 11.5, 11.7 -- usually gives twice per year  Last donation was 7/11 Is due for colonoscopy for fam hx- will call [facility] office when she gets home  Patient Active Problem List  Diagnoses  . HYPOTHYROIDISM  . HYPERLIPIDEMIA  . OBESITY  . ANXIETY  . HYPERTENSION, BENIGN  . OSTEOARTHRITIS, GENERALIZED  . HYPERGLYCEMIA  . ALLERGIC RHINITIS  . Ear pain  . Anemia   Past Medical History  Diagnosis Date  . Hypothyroid   . Hyperlipidemia   . Hypertension   . Arthritis     OA knee replacement  . GERD (gastroesophageal reflux disease)   . Allergy     allergic rhinitis  . Reactive airways dysfunction syndrome   . Anxiety   . Skin lesions, generalized     squamous cell skin lesions and basal cell skin lesions   Past Surgical History  Procedure Date  . Tsa   . Abdominal hysterectomy 1990s    hyst with oophrectomy for B9lesion/cyst and prolapse  . Recocele     and cystocele repair  . Joint replacement 05/2009    left total knee replacement   History  Substance Use Topics  . Smoking status: Never Smoker   . Smokeless tobacco: Not on file  . Alcohol Use: No   Family History  Problem  Relation Age of Onset  . Hypertension Mother   . Stroke Mother   . Depression Mother   . Alzheimer's disease Mother   . Heart disease Father   . Hypertension Sister    Allergies  Allergen Reactions  . Neosporin (Neomycin-Polymyx-Gramicid) Other (See Comments)    blisters   Current Outpatient Prescriptions on File Prior to Visit  Medication Sig Dispense Refill  . celecoxib (CELEBREX) 200 MG capsule Take one capsule by mouth every day as needed for pain with food.  30 capsule  1  . esomeprazole (NEXIUM) 40 MG capsule Take 1 capsule (40 mg total) by mouth daily before breakfast.  30 capsule  3  . Estradiol (VIVELLE-DOT TD) Change patch 2 times weekly       . fluticasone (FLONASE) 50 MCG/ACT nasal spray 2 sprays each nostril in AM and 1 spray each nostril in PM      . Glucosamine-Chondroitin 750-600 MG TABS Take 1 tablet by mouth 2 (two) times daily.        Marland Kitchen levothyroxine (SYNTHROID, LEVOTHROID)  88 MCG tablet Take 88 mcg by mouth daily.        . Multiple Vitamin (MULTIVITAMIN) tablet Take 1 tablet by mouth daily.        . verapamil (CALAN-SR) 240 MG CR tablet Take 240 mg by mouth 2 (two) times daily.        Marland Kitchen acetaminophen (TYLENOL) 500 MG tablet Take 500 mg by mouth every 6 (six) hours as needed.        Marland Kitchen albuterol (PROVENTIL,VENTOLIN) 90 MCG/ACT inhaler Inhale 2 puffs into the lungs every 4 (four) hours as needed.        . carisoprodol (SOMA) 350 MG tablet Take 350 mg by mouth 3 (three) times daily as needed.        . chlorpheniramine-phenylephrine (NY-TANNIC) 9-25 MG TABS Take 1 tablet by mouth 2 (two) times daily.                 Review of Systems Review of Systems  Constitutional: Negative for fever, appetite change, fatigue and unexpected weight change.  Eyes: Negative for pain and visual disturbance.  Respiratory: Negative for cough and shortness of breath.   Cardiovascular: Negative. For cp / palp  Gastrointestinal: Negative for nausea, diarrhea and constipation. and abd  pain and blood in stool Genitourinary: Negative for urgency and frequency.  Skin: Negative for pallor. pos for insect bite and itching  Neurological: Negative for weakness, light-headedness, numbness and headaches.  Hematological: Negative for adenopathy. Does not bruise/bleed easily.  Psychiatric/Behavioral: Negative for dysphoric mood. The patient is not nervous/anxious.          Objective:   Physical Exam  Constitutional: She appears well-developed and well-nourished. No distress.       Obese and well appearing   HENT:  Head: Normocephalic and atraumatic.  Right Ear: External ear normal.  Left Ear: External ear normal.  Nose: Nose normal.  Mouth/Throat: Oropharynx is clear and moist.  Eyes: Conjunctivae and EOM are normal. Pupils are equal, round, and reactive to light.  Neck: Normal range of motion. Neck supple. No JVD present. Carotid bruit is not present. No thyromegaly present.  Cardiovascular: Normal rate, regular rhythm and normal heart sounds.   Pulmonary/Chest: Effort normal and breath sounds normal. No respiratory distress. She has no wheezes.  Abdominal: Soft. Bowel sounds are normal. She exhibits no distension, no abdominal bruit and no mass. There is no tenderness.  Musculoskeletal: Normal range of motion. She exhibits no tenderness.       Mild edema on top of R foot - nt  Lymphadenopathy:    She has no cervical adenopathy.  Neurological: She is alert. She has normal reflexes. Coordination normal.  Skin: Skin is warm and dry. There is erythema. No pallor.       R foot - small 3 mm blister between 3rd and 4th toes Sterilely drained with scalpel/ dressed with bactroban No tick parts No redness No joint swelling  Mild soft tissue swelling on top of foot   Psychiatric: She has a normal mood and affect.          Assessment & Plan:

## 2010-06-29 LAB — COMPREHENSIVE METABOLIC PANEL
AST: 21 U/L (ref 0–37)
BUN: 25 mg/dL — ABNORMAL HIGH (ref 6–23)
Calcium: 9.4 mg/dL (ref 8.4–10.5)
Chloride: 105 mEq/L (ref 96–112)
GFR: 48.78 mL/min — ABNORMAL LOW (ref >60.00)

## 2010-06-29 LAB — CBC WITH DIFFERENTIAL/PLATELET
Basophils Absolute: 0.1 10*3/uL (ref 0.0–0.1)
Eosinophils Absolute: 0.3 10*3/uL (ref 0.0–0.7)
Eosinophils Relative: 4.2 % (ref 0.0–5.0)
Hemoglobin: 11.5 g/dL — ABNORMAL LOW (ref 12.0–15.0)
Lymphocytes Relative: 28.8 % (ref 12.0–46.0)
Monocytes Relative: 10.9 % (ref 3.0–12.0)
Neutro Abs: 4.5 10*3/uL (ref 1.4–7.7)
WBC: 8.3 10*3/uL (ref 4.5–10.5)

## 2010-06-30 ENCOUNTER — Telehealth: Payer: Self-pay

## 2010-06-30 NOTE — Telephone Encounter (Signed)
Dr Milinda Antis is this documentation satisfactory for you? Pt would not accept help getting colonoscopy appt and would not call back the date and time of appt after pt talks with Dr Kinnie Scales. Pt did say she would have Dr Kinnie Scales send results when colonoscopy done.Please advise.

## 2010-06-30 NOTE — Telephone Encounter (Signed)
That is fine with me-thanks  Please send his office copy of labs-thanks

## 2010-06-30 NOTE — Telephone Encounter (Signed)
Copy of labs and this note faxed to Dr Kinnie Scales [phone] as instructed.

## 2010-06-30 NOTE — Telephone Encounter (Signed)
Patient notified as instructed by telephone. Pt said she will contact Dr [facility] office and set up colonoscopy appt. I asked pt if she needed any help making appt and she said no she would do it. I asked pt if she would call me back to let Dr Milinda Antis know when the colonoscopy was scheduled. Pt said she would ask Dr Kinnie Scales to send results of colonscopy to Dr Milinda Antis.

## 2010-06-30 NOTE — Telephone Encounter (Signed)
Message copied by Patience Musca on Thu Jun 30, 2010  2:28 PM ------      Message from: Roxy Manns A      Created: Thu Jun 30, 2010 11:49 AM       Very mild anemia as expected with hb 11.5       Sugar slt high      Watch sugar in diet and work on wt loss       Make appt for your colonosc to eval the anemia -- if she needs ref please call

## 2010-07-05 ENCOUNTER — Ambulatory Visit: Payer: Self-pay | Admitting: Otolaryngology

## 2010-07-14 ENCOUNTER — Other Ambulatory Visit: Payer: Self-pay | Admitting: Family Medicine

## 2010-07-15 ENCOUNTER — Other Ambulatory Visit: Payer: Self-pay | Admitting: *Deleted

## 2010-07-25 ENCOUNTER — Ambulatory Visit: Payer: Self-pay | Admitting: Otolaryngology

## 2010-08-10 ENCOUNTER — Other Ambulatory Visit: Payer: Self-pay | Admitting: Family Medicine

## 2010-08-10 NOTE — Telephone Encounter (Signed)
[name] request refill on Celebrex 200mg . Please advise.

## 2010-08-10 NOTE — Telephone Encounter (Signed)
Will send electronically.

## 2010-08-13 ENCOUNTER — Other Ambulatory Visit: Payer: Self-pay | Admitting: Family Medicine

## 2010-08-16 ENCOUNTER — Other Ambulatory Visit: Payer: Self-pay | Admitting: Family Medicine

## 2010-08-19 ENCOUNTER — Ambulatory Visit: Payer: Self-pay | Admitting: Otolaryngology

## 2010-09-08 ENCOUNTER — Other Ambulatory Visit: Payer: Self-pay | Admitting: *Deleted

## 2010-09-08 MED ORDER — ESOMEPRAZOLE MAGNESIUM 40 MG PO CPDR: 40.0000 mg | DELAYED_RELEASE_CAPSULE | Freq: Every day | ORAL | Status: DC

## 2010-09-11 ENCOUNTER — Other Ambulatory Visit: Payer: Self-pay | Admitting: Family Medicine

## 2010-09-20 ENCOUNTER — Other Ambulatory Visit: Payer: Self-pay | Admitting: Obstetrics and Gynecology

## 2010-09-20 DIAGNOSIS — Z1231 Encounter for screening mammogram for malignant neoplasm of breast: Secondary | ICD-10-CM

## 2010-09-28 ENCOUNTER — Ambulatory Visit: Payer: Medicare Other

## 2010-10-05 ENCOUNTER — Ambulatory Visit (INDEPENDENT_AMBULATORY_CARE_PROVIDER_SITE_OTHER): Payer: No Typology Code available for payment source | Admitting: *Deleted

## 2010-10-05 DIAGNOSIS — Z23 Encounter for immunization: Secondary | ICD-10-CM

## 2010-10-05 DIAGNOSIS — Z2911 Encounter for prophylactic immunotherapy for respiratory syncytial virus (RSV): Secondary | ICD-10-CM

## 2010-10-06 ENCOUNTER — Other Ambulatory Visit: Payer: Self-pay | Admitting: Family Medicine

## 2010-10-06 MED ORDER — ESOMEPRAZOLE MAGNESIUM 40 MG PO CPDR: 40.0000 mg | DELAYED_RELEASE_CAPSULE | Freq: Every day | ORAL | Status: DC

## 2010-10-18 ENCOUNTER — Ambulatory Visit (INDEPENDENT_AMBULATORY_CARE_PROVIDER_SITE_OTHER): Payer: No Typology Code available for payment source | Admitting: Family Medicine

## 2010-10-18 ENCOUNTER — Encounter: Payer: Self-pay | Admitting: Family Medicine

## 2010-10-18 VITALS — BP 140/84 | HR 84 | Temp 98.0°F | Ht 67.75 in | Wt 238.8 lb

## 2010-10-18 DIAGNOSIS — H93A9 Pulsatile tinnitus, unspecified ear: Secondary | ICD-10-CM | POA: Insufficient documentation

## 2010-10-18 DIAGNOSIS — E039 Hypothyroidism, unspecified: Secondary | ICD-10-CM

## 2010-10-18 DIAGNOSIS — I10 Essential (primary) hypertension: Secondary | ICD-10-CM

## 2010-10-18 DIAGNOSIS — E785 Hyperlipidemia, unspecified: Secondary | ICD-10-CM

## 2010-10-18 DIAGNOSIS — Z23 Encounter for immunization: Secondary | ICD-10-CM

## 2010-10-18 DIAGNOSIS — H9319 Tinnitus, unspecified ear: Secondary | ICD-10-CM

## 2010-10-18 DIAGNOSIS — D649 Anemia, unspecified: Secondary | ICD-10-CM

## 2010-10-18 DIAGNOSIS — R7309 Other abnormal glucose: Secondary | ICD-10-CM

## 2010-10-18 LAB — CBC WITH DIFFERENTIAL/PLATELET
Basophils Relative: 0.9 % (ref 0.0–3.0)
Eosinophils Absolute: 0.2 10*3/uL (ref 0.0–0.7)
Eosinophils Relative: 2.9 % (ref 0.0–5.0)
Lymphocytes Relative: 30 % (ref 12.0–46.0)
Neutrophils Relative %: 57.8 % (ref 43.0–77.0)
Platelets: 338 10*3/uL (ref 150.0–400.0)
RBC: 5.02 Mil/uL (ref 3.87–5.11)
WBC: 5.3 10*3/uL (ref 4.5–10.5)

## 2010-10-18 LAB — COMPREHENSIVE METABOLIC PANEL
ALT: 23 U/L (ref 0–35)
AST: 26 U/L (ref 0–37)
Albumin: 3.9 g/dL (ref 3.5–5.2)
Alkaline Phosphatase: 83 U/L (ref 39–117)
Potassium: 4 mEq/L (ref 3.5–5.1)
Sodium: 142 mEq/L (ref 135–145)
Total Protein: 7.5 g/dL (ref 6.0–8.3)

## 2010-10-18 LAB — LIPID PANEL
HDL: 41.5 mg/dL (ref >39.00)
LDL Cholesterol: 99 mg/dL (ref 0–99)
VLDL: 37 mg/dL (ref 0.0–40.0)

## 2010-10-18 NOTE — Patient Instructions (Signed)
Labs today  Keep working on healthy diet and exercise  Flu shot today  Avoid red meat/ fried foods/ egg yolks/ fatty breakfast meats/ butter, cheese and high fat dairy/ and shellfish   Try to exercise 5 days per week  Schedule 30 minute check up in 6 months with labs prior

## 2010-10-18 NOTE — Assessment & Plan Note (Signed)
Check lipids today Disc low sat fat diet- was worse recently on vacation Disc goals for LDL - under 100  Exercise is good

## 2010-10-18 NOTE — Assessment & Plan Note (Signed)
Per pt has had full w/u from ENt and vascular- cannot find a cause Feels ok

## 2010-10-18 NOTE — Assessment & Plan Note (Signed)
Disc need for low glycemic diet and wt loss Did well until vacation and getting back on track now a1c today and adv F/u in 6 mo for check up

## 2010-10-18 NOTE — Assessment & Plan Note (Signed)
No med today- bp up a bit  Has been well controlled at home Some edema from her meds- tolerates that - suggested supp hose when traveling  Flu shot today Lab today F/u 6 mo

## 2010-10-18 NOTE — Progress Notes (Signed)
Subjective:    Patient ID: Amber Miles, female    DOB: 11-Jan-1946, 65 y.o.   MRN: [id number]  HPI Here for f/u of HTN and hypothyroid and lipids and hyperglycemia   HTN 140/84 today on dyazide and calan (has not taken any med this am )  No ha or palpitatoins or cp  Wt is down 2 lb with bmi of 36 Diet- was good but just on vacation (had prev lost about 6 lbs)   Is walking for exercise and also walking stairs   Hyperglycemia - last gluc 111 fasting in June Does not check this  Diet poor on vacation but finally getting back to normal   Hypothyroid-- no problems  No change in hair or skin   Last lipids in dec were well controlled   ? About flu shot  ? All to latex   Still hears heartbeat in her ears  Dr Andee Poles - found a meningioma in head - and watching that  Is driving her crazy - and not sure when she is following up with him again -- everything is back  Also saw vascular doc with carotids - normal   Patient Active Problem List  Diagnoses  . HYPOTHYROIDISM  . HYPERLIPIDEMIA  . OBESITY  . ANXIETY  . HYPERTENSION, BENIGN  . OSTEOARTHRITIS, GENERALIZED  . HYPERGLYCEMIA  . ALLERGIC RHINITIS  . Ear pain  . Anemia  . Pulsatile tinnitus   Past Medical History  Diagnosis Date  . Hypothyroid   . Hyperlipidemia   . Hypertension   . Arthritis     OA knee replacement  . GERD (gastroesophageal reflux disease)   . Allergy     allergic rhinitis  . Reactive airways dysfunction syndrome   . Anxiety   . Skin lesions, generalized     squamous cell skin lesions and basal cell skin lesions   Past Surgical History  Procedure Date  . Tsa   . Abdominal hysterectomy 1990s    hyst with oophrectomy for B9lesion/cyst and prolapse  . Recocele     and cystocele repair  . Joint replacement 05/2009    left total knee replacement   History  Substance Use Topics  . Smoking status: Never Smoker   . Smokeless tobacco: Not on file  . Alcohol Use: No   Family History  Problem  Relation Age of Onset  . Hypertension Mother   . Stroke Mother   . Depression Mother   . Alzheimer's disease Mother   . Heart disease Father   . Hypertension Sister    Allergies  Allergen Reactions  . Neosporin (Neomycin-Polymyx-Gramicid) Other (See Comments)    blisters  . Tape Swelling    Itching and redness Pt said severe reaction to surgical tape.? latex   Current Outpatient Prescriptions on File Prior to Visit  Medication Sig Dispense Refill  . CELEBREX 200 MG capsule TAKE ONE CAPSULE BY MOUTH EVERY DAY AS NEEDED FOR PAIN WITH FOOD.  30 capsule  5  . esomeprazole (NEXIUM) 40 MG capsule Take 1 capsule (40 mg total) by mouth daily before breakfast.  30 capsule  0  . Estradiol (VIVELLE-DOT TD) Change patch 2 times weekly       . fluticasone (FLONASE) 50 MCG/ACT nasal spray 2 sprays each nostril in AM and 1 spray each nostril in PM      . Glucosamine-Chondroitin 750-600 MG TABS Take 1 tablet by mouth 2 (two) times daily.        Marland Kitchen KLOR-CON M20  20 MEQ tablet TAKE 1 TABLET (20 MEQ TOTAL) BY MOUTH DAILY.  30 tablet  0  . levothyroxine (SYNTHROID, LEVOTHROID) 88 MCG tablet Take 88 mcg by mouth daily.        . Multiple Vitamin (MULTIVITAMIN) tablet Take 1 tablet by mouth daily.        . potassium chloride SA (K-DUR,KLOR-CON) 20 MEQ tablet Take 10 mEq by mouth daily.        Marland Kitchen triamterene-hydrochlorothiazide (DYAZIDE) 37.5-25 MG per capsule Take 2 capsules by mouth daily.  60 capsule  11  . verapamil (CALAN-SR) 240 MG CR tablet TAKE ONE BY MOUTH TWICE A DAY  90 tablet  0  . acetaminophen (TYLENOL) 500 MG tablet Take 500 mg by mouth every 6 (six) hours as needed.        Marland Kitchen albuterol (PROVENTIL,VENTOLIN) 90 MCG/ACT inhaler Inhale 2 puffs into the lungs every 4 (four) hours as needed.        . carisoprodol (SOMA) 350 MG tablet Take 350 mg by mouth 3 (three) times daily as needed.        . chlorpheniramine-phenylephrine (NY-TANNIC) 9-25 MG TABS Take 1 tablet by mouth 2 (two) times daily.                Review of Systems Review of Systems  Constitutional: Negative for fever, appetite change, fatigue and unexpected weight change.  Eyes: Negative for pain and visual disturbance.  ENT pos for tinnitus  Respiratory: Negative for cough and shortness of breath.   Cardiovascular: Negative for cp or palpitations    Gastrointestinal: Negative for nausea, diarrhea and constipation.  Genitourinary: Negative for urgency and frequency.  Skin: Negative for pallor or rash   Neurological: Negative for weakness, light-headedness, numbness and headaches.  Hematological: Negative for adenopathy. Does not bruise/bleed easily.  Psychiatric/Behavioral: Negative for dysphoric mood. The patient is not nervous/anxious.          Objective:   Physical Exam  Constitutional: She appears well-developed and well-nourished. No distress.       overwt and well appearing   HENT:  Head: Normocephalic and atraumatic.  Right Ear: External ear normal.  Left Ear: External ear normal.  Eyes: Conjunctivae and EOM are normal. Pupils are equal, round, and reactive to light.  Neck: Normal range of motion. Neck supple. No JVD present. Carotid bruit is not present. No thyromegaly present.  Cardiovascular: Normal rate, regular rhythm, normal heart sounds and intact distal pulses.  Exam reveals no gallop.   Pulmonary/Chest: Effort normal and breath sounds normal. No respiratory distress. She has no wheezes. She exhibits no tenderness.  Abdominal: Soft. Bowel sounds are normal. She exhibits no distension, no abdominal bruit and no mass. There is no tenderness.  Musculoskeletal: Normal range of motion. She exhibits no edema and no tenderness.  Lymphadenopathy:    She has no cervical adenopathy.  Neurological: She is alert. She has normal reflexes. No cranial nerve deficit. Coordination normal.  Skin: Skin is warm and dry. No rash noted. No erythema. No pallor.  Psychiatric: She has a normal mood and affect.           Assessment & Plan:

## 2010-10-18 NOTE — Assessment & Plan Note (Signed)
With goiter- cared for by Dr Ferdinand Lango Per pt doing very well

## 2010-10-18 NOTE — Assessment & Plan Note (Signed)
In past from blood donation Has had colonosc- reassuring  Cbc today

## 2010-10-20 ENCOUNTER — Ambulatory Visit: Payer: Medicare Other

## 2010-10-20 ENCOUNTER — Ambulatory Visit
Admission: RE | Admit: 2010-10-20 | Discharge: 2010-10-20 | Disposition: A | Discharge status: Home / Self Care | Payer: Medicare Other | Source: Ambulatory Visit | Attending: Obstetrics and Gynecology | Admitting: Obstetrics and Gynecology

## 2010-10-20 DIAGNOSIS — Z1231 Encounter for screening mammogram for malignant neoplasm of breast: Secondary | ICD-10-CM

## 2010-10-27 ENCOUNTER — Other Ambulatory Visit: Payer: Self-pay

## 2010-10-27 MED ORDER — CARISOPRODOL 350 MG PO TABS: 350.0000 mg | ORAL_TABLET | Freq: Three times a day (TID) | ORAL | Status: DC | PRN

## 2010-10-27 NOTE — Telephone Encounter (Signed)
Px written for call in   

## 2010-10-27 NOTE — Telephone Encounter (Signed)
Medication phoned to [facility] pharmacy as instructed.Patient notified as instructed by telephone.

## 2010-10-27 NOTE — Telephone Encounter (Signed)
[name] faxed refill request Carisoprodol 350 mg. Last filled 02/14/10. I spoke with pt and she said she could wait until Mon to get rx because she likes to have them on hand in case she needs them and pt will be going out of town on [place]. Pt will ck with pharmacy late Monday for refill and if rx not there she will call back on Tues morning as a reminder. Thank you.

## 2010-11-08 ENCOUNTER — Other Ambulatory Visit: Payer: Self-pay | Admitting: Family Medicine

## 2010-11-08 MED ORDER — POTASSIUM CHLORIDE CRYS ER 20 MEQ PO TBCR: EXTENDED_RELEASE_TABLET | ORAL | Status: DC

## 2010-11-08 MED ORDER — ESOMEPRAZOLE MAGNESIUM 40 MG PO CPDR: 40.0000 mg | DELAYED_RELEASE_CAPSULE | Freq: Every day | ORAL | Status: DC

## 2010-11-08 NOTE — Telephone Encounter (Signed)
[name] request refill on Maxzide 37.5-25 mg. Already refilled 06/2010

## 2010-11-10 ENCOUNTER — Other Ambulatory Visit: Payer: Self-pay | Admitting: Family Medicine

## 2010-11-11 ENCOUNTER — Other Ambulatory Visit: Payer: Self-pay | Admitting: *Deleted

## 2010-12-12 ENCOUNTER — Other Ambulatory Visit: Payer: Self-pay | Admitting: Family Medicine

## 2010-12-12 NOTE — Telephone Encounter (Signed)
Px written for call in  F/u if not improved  

## 2010-12-12 NOTE — Telephone Encounter (Signed)
[name] request refill on Proair Hfa.Pt request Albuterol inhaler. Pt said she has no fever and no sorethroat but does have laryngitis and has an asthmatic non productive cough that the albuterol is helping. Pt said she had wheezing and tightness in chest before using Albuterol.Please advise./

## 2010-12-13 ENCOUNTER — Other Ambulatory Visit: Payer: Self-pay | Admitting: Family Medicine

## 2010-12-13 NOTE — Telephone Encounter (Signed)
Rx called in as directed and patient notified to follow up if no improvement. She verbalized understanding.

## 2011-01-25 ENCOUNTER — Ambulatory Visit: Payer: Self-pay | Admitting: Neurosurgery

## 2011-01-25 DIAGNOSIS — D321 Benign neoplasm of spinal meninges: Secondary | ICD-10-CM | POA: Diagnosis not present

## 2011-01-25 DIAGNOSIS — D320 Benign neoplasm of cerebral meninges: Secondary | ICD-10-CM | POA: Diagnosis not present

## 2011-01-25 DIAGNOSIS — Z1389 Encounter for screening for other disorder: Secondary | ICD-10-CM | POA: Diagnosis not present

## 2011-01-25 LAB — CREATININE, SERUM
Creatinine: 0.96 mg/dL (ref 0.60–1.30)
EGFR (Non-African Amer.): >60

## 2011-02-07 ENCOUNTER — Other Ambulatory Visit: Payer: Self-pay | Admitting: *Deleted

## 2011-02-07 MED ORDER — CELECOXIB 200 MG PO CAPS: 200.0000 mg | ORAL_CAPSULE | Freq: Every day | ORAL | Status: DC

## 2011-02-07 NOTE — Telephone Encounter (Signed)
Will refill electronically  

## 2011-03-03 DIAGNOSIS — E042 Nontoxic multinodular goiter: Secondary | ICD-10-CM | POA: Diagnosis not present

## 2011-03-03 DIAGNOSIS — J45909 Unspecified asthma, uncomplicated: Secondary | ICD-10-CM | POA: Diagnosis not present

## 2011-03-03 DIAGNOSIS — E890 Postprocedural hypothyroidism: Secondary | ICD-10-CM | POA: Diagnosis not present

## 2011-03-03 DIAGNOSIS — I10 Essential (primary) hypertension: Secondary | ICD-10-CM | POA: Diagnosis not present

## 2011-03-03 DIAGNOSIS — K219 Gastro-esophageal reflux disease without esophagitis: Secondary | ICD-10-CM | POA: Diagnosis not present

## 2011-03-03 DIAGNOSIS — E041 Nontoxic single thyroid nodule: Secondary | ICD-10-CM | POA: Diagnosis not present

## 2011-03-03 DIAGNOSIS — E039 Hypothyroidism, unspecified: Secondary | ICD-10-CM | POA: Diagnosis not present

## 2011-03-03 DIAGNOSIS — D34 Benign neoplasm of thyroid gland: Secondary | ICD-10-CM | POA: Diagnosis not present

## 2011-03-03 DIAGNOSIS — T382X5A Adverse effect of antithyroid drugs, initial encounter: Secondary | ICD-10-CM | POA: Diagnosis not present

## 2011-03-07 DIAGNOSIS — E039 Hypothyroidism, unspecified: Secondary | ICD-10-CM | POA: Diagnosis not present

## 2011-03-07 DIAGNOSIS — J45909 Unspecified asthma, uncomplicated: Secondary | ICD-10-CM | POA: Diagnosis not present

## 2011-03-07 DIAGNOSIS — T382X5A Adverse effect of antithyroid drugs, initial encounter: Secondary | ICD-10-CM | POA: Diagnosis not present

## 2011-03-07 DIAGNOSIS — D34 Benign neoplasm of thyroid gland: Secondary | ICD-10-CM | POA: Diagnosis not present

## 2011-03-07 DIAGNOSIS — E041 Nontoxic single thyroid nodule: Secondary | ICD-10-CM | POA: Diagnosis not present

## 2011-03-07 DIAGNOSIS — N959 Unspecified menopausal and perimenopausal disorder: Secondary | ICD-10-CM | POA: Diagnosis not present

## 2011-03-07 DIAGNOSIS — I10 Essential (primary) hypertension: Secondary | ICD-10-CM | POA: Diagnosis not present

## 2011-03-07 DIAGNOSIS — E890 Postprocedural hypothyroidism: Secondary | ICD-10-CM | POA: Diagnosis not present

## 2011-03-07 DIAGNOSIS — E0520 Thyrotoxicosis with toxic multinodular goiter without thyrotoxic crisis or storm: Secondary | ICD-10-CM | POA: Diagnosis not present

## 2011-03-07 DIAGNOSIS — K219 Gastro-esophageal reflux disease without esophagitis: Secondary | ICD-10-CM | POA: Diagnosis not present

## 2011-03-07 DIAGNOSIS — E042 Nontoxic multinodular goiter: Secondary | ICD-10-CM | POA: Diagnosis not present

## 2011-03-13 ENCOUNTER — Other Ambulatory Visit: Payer: Self-pay | Admitting: Family Medicine

## 2011-04-07 ENCOUNTER — Other Ambulatory Visit: Payer: Self-pay | Admitting: *Deleted

## 2011-04-07 NOTE — Telephone Encounter (Signed)
Faxed request from [facility], last filled date not given.

## 2011-04-09 MED ORDER — CARISOPRODOL 350 MG PO TABS: 350.0000 mg | ORAL_TABLET | Freq: Three times a day (TID) | ORAL | Status: DC | PRN

## 2011-04-09 NOTE — Telephone Encounter (Signed)
Sent!

## 2011-04-10 DIAGNOSIS — M9981 Other biomechanical lesions of cervical region: Secondary | ICD-10-CM | POA: Diagnosis not present

## 2011-04-10 DIAGNOSIS — M542 Cervicalgia: Secondary | ICD-10-CM | POA: Diagnosis not present

## 2011-04-10 DIAGNOSIS — M999 Biomechanical lesion, unspecified: Secondary | ICD-10-CM | POA: Diagnosis not present

## 2011-04-13 DIAGNOSIS — M9981 Other biomechanical lesions of cervical region: Secondary | ICD-10-CM | POA: Diagnosis not present

## 2011-04-13 DIAGNOSIS — M999 Biomechanical lesion, unspecified: Secondary | ICD-10-CM | POA: Diagnosis not present

## 2011-04-13 DIAGNOSIS — M542 Cervicalgia: Secondary | ICD-10-CM | POA: Diagnosis not present

## 2011-04-18 ENCOUNTER — Ambulatory Visit: Payer: Medicare Other | Admitting: Family Medicine

## 2011-04-24 DIAGNOSIS — M722 Plantar fascial fibromatosis: Secondary | ICD-10-CM | POA: Diagnosis not present

## 2011-05-02 DIAGNOSIS — M546 Pain in thoracic spine: Secondary | ICD-10-CM | POA: Diagnosis not present

## 2011-05-02 DIAGNOSIS — M999 Biomechanical lesion, unspecified: Secondary | ICD-10-CM | POA: Diagnosis not present

## 2011-05-04 DIAGNOSIS — H40019 Open angle with borderline findings, low risk, unspecified eye: Secondary | ICD-10-CM | POA: Diagnosis not present

## 2011-05-04 DIAGNOSIS — Z0100 Encounter for examination of eyes and vision without abnormal findings: Secondary | ICD-10-CM | POA: Diagnosis not present

## 2011-05-23 ENCOUNTER — Telehealth: Payer: Self-pay | Admitting: Family Medicine

## 2011-05-23 NOTE — Telephone Encounter (Signed)
Patient received a no show charge for date of service 04/18/11.  Patient said when the teleminder called to remind her of the appointment, she cancelled the appointment.

## 2011-05-29 DIAGNOSIS — B351 Tinea unguium: Secondary | ICD-10-CM | POA: Diagnosis not present

## 2011-05-29 DIAGNOSIS — L608 Other nail disorders: Secondary | ICD-10-CM | POA: Diagnosis not present

## 2011-05-29 DIAGNOSIS — D485 Neoplasm of uncertain behavior of skin: Secondary | ICD-10-CM | POA: Diagnosis not present

## 2011-05-29 DIAGNOSIS — M722 Plantar fascial fibromatosis: Secondary | ICD-10-CM | POA: Diagnosis not present

## 2011-05-31 ENCOUNTER — Encounter: Payer: Self-pay | Admitting: Family Medicine

## 2011-05-31 ENCOUNTER — Ambulatory Visit (INDEPENDENT_AMBULATORY_CARE_PROVIDER_SITE_OTHER): Payer: Medicare Other | Admitting: Family Medicine

## 2011-05-31 VITALS — BP 138/80 | HR 60 | Temp 97.6°F | Ht 67.75 in | Wt 246.5 lb

## 2011-05-31 DIAGNOSIS — I10 Essential (primary) hypertension: Secondary | ICD-10-CM | POA: Diagnosis not present

## 2011-05-31 DIAGNOSIS — R7309 Other abnormal glucose: Secondary | ICD-10-CM | POA: Diagnosis not present

## 2011-05-31 DIAGNOSIS — E669 Obesity, unspecified: Secondary | ICD-10-CM

## 2011-05-31 MED ORDER — FLUTICASONE PROPIONATE 50 MCG/ACT NA SUSP: 2.0000 | Freq: Every day | NASAL | Status: DC

## 2011-05-31 NOTE — Patient Instructions (Addendum)
No change in medicines Start working on lower sugar diet  Eat more fresh fruits and vegetables and lean protein  Work on weight loss -- try to make a plan for exercise indoors or out - work up to 5 days per week  Blood pressure is ok  Follow up in 6 months for annual exam with labs prior

## 2011-05-31 NOTE — Assessment & Plan Note (Signed)
With last a1c under 6.5  Rev diet and need for wt loss in detail  Given 2 handouts on low glycemic diet  Not motivated to exercise- but disc opt for indoor and outdoor exercise also  F/u 6 mo  a1c today

## 2011-05-31 NOTE — Assessment & Plan Note (Addendum)
Discussed how this problem influences overall health and the risks it imposes  Reviewed plan for weight loss with lower calorie diet (via better food choices and also portion control or program like weight watchers) and exercise building up to or more than 30 minutes 5 days per week including some aerobic activity    Pt is overall not very motivated towards this, but I tried to stress importance to overall health

## 2011-05-31 NOTE — Assessment & Plan Note (Signed)
bp in fair control at this time  No changes needed  Disc lifstyle change with low sodium diet and exercise   Rev last labs  Disc exercise F/u 6 mo

## 2011-05-31 NOTE — Progress Notes (Signed)
Subjective:    Patient ID: Amber Miles, female    DOB: Aug 11, 1945, 66 y.o.   MRN: [id number]  HPI Here for f/u of chronic health conditions Is doing ok overall   Had a bad bout of plantar fasciitis - had a shot and boot to sleep in  Is doing better with that  Also knot of arthritis on top of her foot   Has meningioma in her brain -- has not grown and just being watched at this time (goes to [place])  Does not give her any symptoms  Was incidental finding  Still has pulsitle tinnitus  Sees ENT    bp is 138/80 today BP Readings from Last 3 Encounters:  05/31/11 138/80  10/18/10 140/84  06/28/10 130/72   bp is stable     Today No cp or palpitations or headaches or edema  No side effects to medicines    Wt is up 8 lb with bmi of 37 Diet- is fair   Exercise - not as much as she was  Due to wet weather , and also plantar fasciitis    Hyperglycemia Lab Results  Component Value Date   HGBA1C 6.1 10/18/2010   does drink sweet tea occasionally  She eats sweets now and then  Does eat some carbs    Hypothyroid - is about the same  Dr Judie Petit may radiate her again - feels about the same  Same dose of thyroid replacement     Chemistry      Component Value Date/Time   NA 142 10/18/2010 1018   K 4.0 10/18/2010 1018   CL 104 10/18/2010 1018   CO2 31 10/18/2010 1018   BUN 15 10/18/2010 1018   CREATININE 1.0 10/18/2010 1018      Component Value Date/Time   CALCIUM 9.3 10/18/2010 1018   ALKPHOS 83 10/18/2010 1018   AST 26 10/18/2010 1018   ALT 23 10/18/2010 1018   BILITOT 0.6 10/18/2010 1018      Lab Results  Component Value Date   CHOL 177 10/18/2010   HDL 41.50 10/18/2010   LDLCALC 99 10/18/2010   TRIG 185.0* 10/18/2010   CHOLHDL 4 10/18/2010    Patient Active Problem List  Diagnoses  . HYPOTHYROIDISM  . HYPERLIPIDEMIA  . OBESITY  . ANXIETY  . HYPERTENSION, BENIGN  . OSTEOARTHRITIS, GENERALIZED  . HYPERGLYCEMIA  . ALLERGIC RHINITIS  . Anemia  . Pulsatile tinnitus    Past Medical History  Diagnosis Date  . Hypothyroid   . Hyperlipidemia   . Hypertension   . Arthritis     OA knee replacement  . GERD (gastroesophageal reflux disease)   . Allergy     allergic rhinitis  . Reactive airways dysfunction syndrome   . Anxiety   . Skin lesions, generalized     squamous cell skin lesions and basal cell skin lesions   Past Surgical History  Procedure Date  . Tsa   . Abdominal hysterectomy 1990s    hyst with oophrectomy for B9lesion/cyst and prolapse  . Recocele     and cystocele repair  . Joint replacement 05/2009    left total knee replacement   History  Substance Use Topics  . Smoking status: Never Smoker   . Smokeless tobacco: Not on file  . Alcohol Use: No   Family History  Problem Relation Age of Onset  . Hypertension Mother   . Stroke Mother   . Depression Mother   . Alzheimer's disease Mother   .  Heart disease Father   . Hypertension Sister    Allergies  Allergen Reactions  . Neosporin (Neomycin-Polymyxin-Gramicidin) Other (See Comments)    blisters  . Tape Swelling    Itching and redness Pt said severe reaction to surgical tape.? latex   Current Outpatient Prescriptions on File Prior to Visit  Medication Sig Dispense Refill  . acetaminophen (TYLENOL) 500 MG tablet Take 500 mg by mouth every 6 (six) hours as needed.        Marland Kitchen albuterol (PROVENTIL,VENTOLIN) 90 MCG/ACT inhaler Inhale 2 puffs into the lungs every 4 (four) hours as needed.        . carisoprodol (SOMA) 350 MG tablet Take 1 tablet (350 mg total) by mouth 3 (three) times daily as needed.  90 tablet  0  . celecoxib (CELEBREX) 200 MG capsule Take 1 capsule (200 mg total) by mouth daily. With food as needed  30 capsule  5  . esomeprazole (NEXIUM) 40 MG capsule Take 1 capsule (40 mg total) by mouth daily before breakfast.  30 capsule  11  . Estradiol (VIVELLE-DOT TD) Change patch 2 times weekly       . fluticasone (FLONASE) 50 MCG/ACT nasal spray Place 2 sprays into the  nose daily.  16 g  11  . Glucosamine-Chondroitin 750-600 MG TABS Take 1 tablet by mouth 2 (two) times daily.        Marland Kitchen levothyroxine (SYNTHROID, LEVOTHROID) 88 MCG tablet Take 88 mcg by mouth daily.        . Multiple Vitamin (MULTIVITAMIN) tablet Take 1 tablet by mouth daily.        . potassium chloride SA (KLOR-CON M20) 20 MEQ tablet Take one tablet by mouth daily.  30 tablet  11  . PROAIR HFA 108 (90 BASE) MCG/ACT inhaler USE 2 PUFFS EVERY 4 HOURS AS NEEDED  1 Inhaler  1  . triamterene-hydrochlorothiazide (MAXZIDE-25) 37.5-25 MG per tablet TAKE 2 TABLETS BY MOUTH ONCE A DAY  60 tablet  11  . verapamil (CALAN-SR) 240 MG CR tablet TAKE ONE BY MOUTH TWICE A DAY  90 tablet  0  . chlorpheniramine-phenylephrine (NY-TANNIC) 9-25 MG TABS Take 1 tablet by mouth 2 (two) times daily.            Review of Systems Review of Systems  Constitutional: Negative for fever, appetite change,and unexpected weight change. pos for fatigue  Eyes: Negative for pain and visual disturbance.  Respiratory: Negative for cough and shortness of breath.   Cardiovascular: Negative for cp or palpitations    Gastrointestinal: Negative for nausea, diarrhea and constipation.  Genitourinary: Negative for urgency and frequency.  Skin: Negative for pallor or rash   MSK pos for chronic joint pain from arthritis  Neurological: Negative for weakness, light-headedness, numbness and headaches.  Hematological: Negative for adenopathy. Does not bruise/bleed easily.  Psychiatric/Behavioral: Negative for dysphoric mood. The patient is not nervous/anxious.         Objective:   Physical Exam  Constitutional: She appears well-developed and well-nourished. No distress.       Obese and well appearing  HENT:  Head: Normocephalic and atraumatic.  Mouth/Throat: Oropharynx is clear and moist.  Eyes: Conjunctivae and EOM are normal. Pupils are equal, round, and reactive to light. No scleral icterus.  Neck: Normal range of motion. Neck  supple. No JVD present. Carotid bruit is not present. Erythema present. No thyromegaly present.  Cardiovascular: Normal rate, regular rhythm, normal heart sounds and intact distal pulses.  Exam reveals no gallop.  Pulmonary/Chest: Effort normal and breath sounds normal. No respiratory distress. She has no wheezes. She exhibits no tenderness.  Abdominal: Soft. Bowel sounds are normal. She exhibits no distension, no abdominal bruit and no mass. There is no tenderness.  Musculoskeletal: She exhibits no edema.  Lymphadenopathy:    She has no cervical adenopathy.  Neurological: She is alert. She has normal reflexes. No cranial nerve deficit. She exhibits normal muscle tone. Coordination normal.  Skin: Skin is warm and dry. No erythema. No pallor.  Psychiatric: She has a normal mood and affect.          Assessment & Plan:

## 2011-06-01 ENCOUNTER — Encounter: Payer: Self-pay | Admitting: *Deleted

## 2011-06-15 DIAGNOSIS — Z79899 Other long term (current) drug therapy: Secondary | ICD-10-CM | POA: Diagnosis not present

## 2011-06-16 DIAGNOSIS — Z79899 Other long term (current) drug therapy: Secondary | ICD-10-CM | POA: Diagnosis not present

## 2011-06-16 DIAGNOSIS — B351 Tinea unguium: Secondary | ICD-10-CM | POA: Diagnosis not present

## 2011-06-23 NOTE — Telephone Encounter (Signed)
Please send to charge correction/pro fee to remove no show charge.  Maybe need to remind staff these can go directly to you and you have ability to send these.  Thanks

## 2011-07-24 ENCOUNTER — Other Ambulatory Visit: Payer: Self-pay

## 2011-07-24 DIAGNOSIS — B351 Tinea unguium: Secondary | ICD-10-CM | POA: Diagnosis not present

## 2011-07-24 DIAGNOSIS — Z79899 Other long term (current) drug therapy: Secondary | ICD-10-CM | POA: Diagnosis not present

## 2011-07-24 MED ORDER — CELECOXIB 200 MG PO CAPS: 200.0000 mg | ORAL_CAPSULE | Freq: Every day | ORAL | Status: DC

## 2011-07-24 NOTE — Telephone Encounter (Signed)
Will refill electronically  

## 2011-07-24 NOTE — Telephone Encounter (Signed)
Ok to refill? Last OV was 05/31/11,

## 2011-08-11 DIAGNOSIS — M542 Cervicalgia: Secondary | ICD-10-CM | POA: Diagnosis not present

## 2011-08-11 DIAGNOSIS — M9981 Other biomechanical lesions of cervical region: Secondary | ICD-10-CM | POA: Diagnosis not present

## 2011-08-11 DIAGNOSIS — M546 Pain in thoracic spine: Secondary | ICD-10-CM | POA: Diagnosis not present

## 2011-08-11 DIAGNOSIS — M999 Biomechanical lesion, unspecified: Secondary | ICD-10-CM | POA: Diagnosis not present

## 2011-08-28 ENCOUNTER — Other Ambulatory Visit: Payer: Self-pay | Admitting: *Deleted

## 2011-08-28 ENCOUNTER — Ambulatory Visit: Payer: Self-pay | Admitting: Neurosurgery

## 2011-08-28 DIAGNOSIS — D332 Benign neoplasm of brain, unspecified: Secondary | ICD-10-CM | POA: Diagnosis not present

## 2011-08-28 DIAGNOSIS — D321 Benign neoplasm of spinal meninges: Secondary | ICD-10-CM | POA: Diagnosis not present

## 2011-08-28 LAB — CREATININE, SERUM
Creatinine: 1.03 mg/dL (ref 0.60–1.30)
EGFR (African American): >60
EGFR (Non-African Amer.): 57 — ABNORMAL LOW

## 2011-08-28 MED ORDER — CELECOXIB 200 MG PO CAPS: 200.0000 mg | ORAL_CAPSULE | Freq: Every day | ORAL | Status: DC

## 2011-09-04 DIAGNOSIS — D321 Benign neoplasm of spinal meninges: Secondary | ICD-10-CM | POA: Diagnosis not present

## 2011-09-22 ENCOUNTER — Other Ambulatory Visit: Payer: Self-pay

## 2011-09-22 MED ORDER — ALBUTEROL SULFATE HFA 108 (90 BASE) MCG/ACT IN AERS: 2.0000 | INHALATION_SPRAY | RESPIRATORY_TRACT | Status: DC | PRN

## 2011-09-22 NOTE — Telephone Encounter (Signed)
Refill request for Proair HFA 90 MCG. Last filled 01/12/11. Last OV 05/31/11. Ok to refill?

## 2011-09-22 NOTE — Telephone Encounter (Signed)
That is fine with 5 refils thanks

## 2011-09-25 ENCOUNTER — Other Ambulatory Visit: Payer: Self-pay | Admitting: Obstetrics and Gynecology

## 2011-09-25 DIAGNOSIS — Z1231 Encounter for screening mammogram for malignant neoplasm of breast: Secondary | ICD-10-CM

## 2011-10-23 ENCOUNTER — Ambulatory Visit
Admission: RE | Admit: 2011-10-23 | Discharge: 2011-10-23 | Disposition: A | Discharge status: Home / Self Care | Payer: Medicare Other | Source: Ambulatory Visit | Attending: Obstetrics and Gynecology | Admitting: Obstetrics and Gynecology

## 2011-10-23 DIAGNOSIS — Z1231 Encounter for screening mammogram for malignant neoplasm of breast: Secondary | ICD-10-CM | POA: Diagnosis not present

## 2011-10-24 ENCOUNTER — Other Ambulatory Visit: Payer: Self-pay | Admitting: *Deleted

## 2011-10-24 ENCOUNTER — Other Ambulatory Visit: Payer: Self-pay | Admitting: Family Medicine

## 2011-10-24 MED ORDER — ESOMEPRAZOLE MAGNESIUM 40 MG PO CPDR: 40.0000 mg | DELAYED_RELEASE_CAPSULE | Freq: Every day | ORAL | Status: DC

## 2011-10-24 MED ORDER — POTASSIUM CHLORIDE CRYS ER 20 MEQ PO TBCR: EXTENDED_RELEASE_TABLET | ORAL | Status: DC

## 2011-11-07 DIAGNOSIS — D485 Neoplasm of uncertain behavior of skin: Secondary | ICD-10-CM | POA: Diagnosis not present

## 2011-11-07 DIAGNOSIS — L851 Acquired keratosis [keratoderma] palmaris et plantaris: Secondary | ICD-10-CM | POA: Diagnosis not present

## 2011-11-07 DIAGNOSIS — Z85828 Personal history of other malignant neoplasm of skin: Secondary | ICD-10-CM | POA: Diagnosis not present

## 2011-11-07 DIAGNOSIS — L821 Other seborrheic keratosis: Secondary | ICD-10-CM | POA: Diagnosis not present

## 2011-11-07 DIAGNOSIS — L570 Actinic keratosis: Secondary | ICD-10-CM | POA: Diagnosis not present

## 2011-11-07 DIAGNOSIS — D239 Other benign neoplasm of skin, unspecified: Secondary | ICD-10-CM | POA: Diagnosis not present

## 2011-11-07 DIAGNOSIS — L738 Other specified follicular disorders: Secondary | ICD-10-CM | POA: Diagnosis not present

## 2011-11-07 DIAGNOSIS — L723 Sebaceous cyst: Secondary | ICD-10-CM | POA: Diagnosis not present

## 2011-11-13 DIAGNOSIS — M545 Low back pain: Secondary | ICD-10-CM | POA: Diagnosis not present

## 2011-11-13 DIAGNOSIS — M999 Biomechanical lesion, unspecified: Secondary | ICD-10-CM | POA: Diagnosis not present

## 2011-11-14 DIAGNOSIS — M545 Low back pain: Secondary | ICD-10-CM | POA: Diagnosis not present

## 2011-11-14 DIAGNOSIS — M999 Biomechanical lesion, unspecified: Secondary | ICD-10-CM | POA: Diagnosis not present

## 2011-11-16 DIAGNOSIS — M545 Low back pain: Secondary | ICD-10-CM | POA: Diagnosis not present

## 2011-11-16 DIAGNOSIS — M999 Biomechanical lesion, unspecified: Secondary | ICD-10-CM | POA: Diagnosis not present

## 2011-11-20 DIAGNOSIS — M999 Biomechanical lesion, unspecified: Secondary | ICD-10-CM | POA: Diagnosis not present

## 2011-11-20 DIAGNOSIS — M545 Low back pain: Secondary | ICD-10-CM | POA: Diagnosis not present

## 2011-11-22 DIAGNOSIS — M545 Low back pain: Secondary | ICD-10-CM | POA: Diagnosis not present

## 2011-11-22 DIAGNOSIS — M542 Cervicalgia: Secondary | ICD-10-CM | POA: Diagnosis not present

## 2011-11-22 DIAGNOSIS — M9981 Other biomechanical lesions of cervical region: Secondary | ICD-10-CM | POA: Diagnosis not present

## 2011-11-22 DIAGNOSIS — Z79899 Other long term (current) drug therapy: Secondary | ICD-10-CM | POA: Diagnosis not present

## 2011-11-22 DIAGNOSIS — M999 Biomechanical lesion, unspecified: Secondary | ICD-10-CM | POA: Diagnosis not present

## 2011-11-23 ENCOUNTER — Ambulatory Visit: Payer: Self-pay | Admitting: Allergy

## 2011-11-23 ENCOUNTER — Telehealth: Payer: Self-pay | Admitting: Family Medicine

## 2011-11-23 DIAGNOSIS — J45909 Unspecified asthma, uncomplicated: Secondary | ICD-10-CM | POA: Diagnosis not present

## 2011-11-23 DIAGNOSIS — D649 Anemia, unspecified: Secondary | ICD-10-CM

## 2011-11-23 DIAGNOSIS — I10 Essential (primary) hypertension: Secondary | ICD-10-CM

## 2011-11-23 DIAGNOSIS — J309 Allergic rhinitis, unspecified: Secondary | ICD-10-CM | POA: Diagnosis not present

## 2011-11-23 DIAGNOSIS — E039 Hypothyroidism, unspecified: Secondary | ICD-10-CM

## 2011-11-23 DIAGNOSIS — H1045 Other chronic allergic conjunctivitis: Secondary | ICD-10-CM | POA: Diagnosis not present

## 2011-11-23 DIAGNOSIS — R7309 Other abnormal glucose: Secondary | ICD-10-CM

## 2011-11-23 DIAGNOSIS — E785 Hyperlipidemia, unspecified: Secondary | ICD-10-CM

## 2011-11-23 NOTE — Telephone Encounter (Signed)
Message copied by Judy Pimple on Thu Nov 23, 2011  7:30 PM ------      Message from: Alvina Chou      Created: Thu Nov 23, 2011 11:35 AM      Regarding: lab orders for Friday, 11.8.13       Patient is scheduled for CPX labs, please order future labs, Thanks , Camelia Eng

## 2011-11-23 NOTE — Telephone Encounter (Signed)
Message copied by Judy Pimple on Thu Nov 23, 2011  7:25 PM ------      Message from: Baldomero Lamy      Created: Thu Nov 16, 2011  2:06 PM      Regarding: cpx labs Fri 11/24/11       Please order  future cpx labs for pt's upcomming lab appt.      Thanks      Rodney Booze

## 2011-11-24 ENCOUNTER — Other Ambulatory Visit (INDEPENDENT_AMBULATORY_CARE_PROVIDER_SITE_OTHER): Payer: Medicare Other

## 2011-11-24 ENCOUNTER — Other Ambulatory Visit: Payer: Medicare Other

## 2011-11-24 DIAGNOSIS — R7309 Other abnormal glucose: Secondary | ICD-10-CM | POA: Diagnosis not present

## 2011-11-24 DIAGNOSIS — E785 Hyperlipidemia, unspecified: Secondary | ICD-10-CM

## 2011-11-24 DIAGNOSIS — M999 Biomechanical lesion, unspecified: Secondary | ICD-10-CM | POA: Diagnosis not present

## 2011-11-24 DIAGNOSIS — Z23 Encounter for immunization: Secondary | ICD-10-CM | POA: Diagnosis not present

## 2011-11-24 DIAGNOSIS — I10 Essential (primary) hypertension: Secondary | ICD-10-CM

## 2011-11-24 DIAGNOSIS — D649 Anemia, unspecified: Secondary | ICD-10-CM | POA: Diagnosis not present

## 2011-11-24 DIAGNOSIS — E039 Hypothyroidism, unspecified: Secondary | ICD-10-CM

## 2011-11-24 DIAGNOSIS — M545 Low back pain: Secondary | ICD-10-CM | POA: Diagnosis not present

## 2011-11-24 DIAGNOSIS — M9981 Other biomechanical lesions of cervical region: Secondary | ICD-10-CM | POA: Diagnosis not present

## 2011-11-24 DIAGNOSIS — M542 Cervicalgia: Secondary | ICD-10-CM | POA: Diagnosis not present

## 2011-11-24 LAB — CBC WITH DIFFERENTIAL/PLATELET
Basophils Relative: 1 % (ref 0.0–3.0)
Eosinophils Relative: 2 % (ref 0.0–5.0)
Lymphocytes Relative: 17.5 % (ref 12.0–46.0)
MCV: 90.2 fl (ref 78.0–100.0)
Monocytes Relative: 6.8 % (ref 3.0–12.0)
Neutrophils Relative %: 72.7 % (ref 43.0–77.0)
RBC: 5.08 Mil/uL (ref 3.87–5.11)
WBC: 8.5 10*3/uL (ref 4.5–10.5)

## 2011-11-24 LAB — LIPID PANEL
HDL: 45.5 mg/dL (ref >39.00)
LDL Cholesterol: 114 mg/dL — ABNORMAL HIGH (ref 0–99)
Total CHOL/HDL Ratio: 4
VLDL: 27.8 mg/dL (ref 0.0–40.0)

## 2011-11-24 LAB — COMPREHENSIVE METABOLIC PANEL
Albumin: 3.9 g/dL (ref 3.5–5.2)
BUN: 23 mg/dL (ref 6–23)
CO2: 29 mEq/L (ref 19–32)
Calcium: 9.7 mg/dL (ref 8.4–10.5)
Chloride: 101 mEq/L (ref 96–112)
Glucose, Bld: 109 mg/dL — ABNORMAL HIGH (ref 70–99)
Potassium: 3.9 mEq/L (ref 3.5–5.1)

## 2011-11-24 LAB — HEMOGLOBIN A1C: Hgb A1c MFr Bld: 5.9 % (ref 4.6–6.5)

## 2011-12-04 ENCOUNTER — Ambulatory Visit (INDEPENDENT_AMBULATORY_CARE_PROVIDER_SITE_OTHER): Payer: Medicare Other | Admitting: Family Medicine

## 2011-12-04 ENCOUNTER — Encounter: Payer: Self-pay | Admitting: Family Medicine

## 2011-12-04 VITALS — BP 158/74 | HR 60 | Temp 97.6°F | Ht 68.0 in | Wt 245.8 lb

## 2011-12-04 DIAGNOSIS — E785 Hyperlipidemia, unspecified: Secondary | ICD-10-CM | POA: Diagnosis not present

## 2011-12-04 DIAGNOSIS — M542 Cervicalgia: Secondary | ICD-10-CM | POA: Diagnosis not present

## 2011-12-04 DIAGNOSIS — Z23 Encounter for immunization: Secondary | ICD-10-CM | POA: Diagnosis not present

## 2011-12-04 DIAGNOSIS — E039 Hypothyroidism, unspecified: Secondary | ICD-10-CM | POA: Diagnosis not present

## 2011-12-04 DIAGNOSIS — M999 Biomechanical lesion, unspecified: Secondary | ICD-10-CM | POA: Diagnosis not present

## 2011-12-04 DIAGNOSIS — M545 Low back pain: Secondary | ICD-10-CM | POA: Diagnosis not present

## 2011-12-04 DIAGNOSIS — I10 Essential (primary) hypertension: Secondary | ICD-10-CM | POA: Diagnosis not present

## 2011-12-04 DIAGNOSIS — E669 Obesity, unspecified: Secondary | ICD-10-CM

## 2011-12-04 DIAGNOSIS — R7309 Other abnormal glucose: Secondary | ICD-10-CM

## 2011-12-04 DIAGNOSIS — M9981 Other biomechanical lesions of cervical region: Secondary | ICD-10-CM | POA: Diagnosis not present

## 2011-12-04 MED ORDER — VERAPAMIL HCL ER 240 MG PO TBCR: 240.0000 mg | EXTENDED_RELEASE_TABLET | Freq: Two times a day (BID) | ORAL | Status: DC

## 2011-12-04 MED ORDER — ESOMEPRAZOLE MAGNESIUM 40 MG PO CPDR: 40.0000 mg | DELAYED_RELEASE_CAPSULE | Freq: Every day | ORAL | Status: DC

## 2011-12-04 MED ORDER — POTASSIUM CHLORIDE CRYS ER 20 MEQ PO TBCR: EXTENDED_RELEASE_TABLET | ORAL | Status: DC

## 2011-12-04 MED ORDER — CELECOXIB 200 MG PO CAPS: 200.0000 mg | ORAL_CAPSULE | Freq: Every day | ORAL | Status: DC

## 2011-12-04 MED ORDER — TRIAMTERENE-HCTZ 37.5-25 MG PO TABS: 2.0000 | ORAL_TABLET | Freq: Every day | ORAL | Status: DC

## 2011-12-04 NOTE — Assessment & Plan Note (Signed)
tsh is high Copy to Dr Judie Petit

## 2011-12-04 NOTE — Assessment & Plan Note (Signed)
Cholesterol up a bit Disc goals for lipids and reasons to control them Rev labs with pt Rev low sat fat diet in detail

## 2011-12-04 NOTE — Progress Notes (Signed)
Subjective:    Patient ID: Amber Miles, female    DOB: Oct 28, 1945, 66 y.o.   MRN: [id number]  HPI Here for check up of chronic medical conditions and to review health mt list   Doing well - and feeling pretty good  Just got back from a trip to [place] - really enjoyed it  A lot of tours and shows   Her back is doing better after seeing a chiropractor - also hip is doing better   Wt is down 1 lb with bmi of 37 Diet - is really trying to eat healthy Exercise- is able to walk some steps now , and doing some walking   bp is up today  No cp or palpitations or headaches or edema  No side effects to medicines  BP Readings from Last 3 Encounters:  12/04/11 158/74  05/31/11 138/80  10/18/10 140/84   ate a bit more salt than usual on vacation At home is in 130s80s   Hypothyroid Lab Results  Component Value Date   TSH 6.41* 11/24/2011   this is high- no missed doses  Will see Dr Patrecia Pace in feb  Is not feeling tired No skin or hair changes   singulair really helps her allergies   Hyperlipidemia Lab Results  Component Value Date   CHOL 187 11/24/2011   CHOL 177 10/18/2010   CHOL 176 01/04/2009   Lab Results  Component Value Date   HDL 45.50 11/24/2011   HDL 56.21 10/18/2010   HDL 30.86 01/04/2009   Lab Results  Component Value Date   LDLCALC 114* 11/24/2011   LDLCALC 99 10/18/2010   LDLCALC 105* 01/04/2009   Lab Results  Component Value Date   TRIG 139.0 11/24/2011   TRIG 185.0* 10/18/2010   TRIG 158.0* 01/04/2009   Lab Results  Component Value Date   CHOLHDL 4 11/24/2011   CHOLHDL 4 10/18/2010   CHOLHDL 4 01/04/2009   No results found for this basename: LDLDIRECT   diet - too much sat fat , her LDL is up a bit  No red meat and not a lot of fried stuff , but eats too much cheese   a1c is 5.9  -no DM but watching it  Not a big sweet eater Some sweet tea    Td- will get today  Pneumonia vaccine- will get today  Flu vaccine - had it this month   mammo  10/13 Self exam- no lumps or changes   Has had hyst  Still has go to to gyn ever 2 years- goes dec 30th   colonosc 7/12 -is up to date with a 5 year recall   Patient Active Problem List  Diagnosis  . HYPOTHYROIDISM  . HYPERLIPIDEMIA  . OBESITY  . ANXIETY  . HYPERTENSION, BENIGN  . OSTEOARTHRITIS, GENERALIZED  . HYPERGLYCEMIA  . ALLERGIC RHINITIS  . Pulsatile tinnitus   Past Medical History  Diagnosis Date  . Hypothyroid   . Hyperlipidemia   . Hypertension   . Arthritis     OA knee replacement  . GERD (gastroesophageal reflux disease)   . Allergy     allergic rhinitis  . Reactive airways dysfunction syndrome   . Anxiety   . Skin lesions, generalized     squamous cell skin lesions and basal cell skin lesions   Past Surgical History  Procedure Date  . Tsa   . Abdominal hysterectomy 1990s    hyst with oophrectomy for B9lesion/cyst and prolapse  . Recocele  and cystocele repair  . Joint replacement 05/2009    left total knee replacement   History  Substance Use Topics  . Smoking status: Never Smoker   . Smokeless tobacco: Not on file  . Alcohol Use: No   Family History  Problem Relation Age of Onset  . Hypertension Mother   . Stroke Mother   . Depression Mother   . Alzheimer's disease Mother   . Heart disease Father   . Hypertension Sister    Allergies  Allergen Reactions  . Neosporin (Neomycin-Polymyxin-Gramicidin) Other (See Comments)    blisters  . Tape Swelling    Itching and redness Pt said severe reaction to surgical tape.? latex   Current Outpatient Prescriptions on File Prior to Visit  Medication Sig Dispense Refill  . albuterol (PROAIR HFA) 108 (90 BASE) MCG/ACT inhaler Inhale 2 puffs into the lungs every 4 (four) hours as needed for wheezing.  1 Inhaler  5  . carisoprodol (SOMA) 350 MG tablet Take 1 tablet (350 mg total) by mouth 3 (three) times daily as needed.  90 tablet  0  . esomeprazole (NEXIUM) 40 MG capsule Take 1 capsule (40 mg  total) by mouth daily before breakfast.  30 capsule  11  . Estradiol (VIVELLE-DOT TD) Change patch 2 times weekly       . fluticasone (FLONASE) 50 MCG/ACT nasal spray Place 2 sprays into the nose daily.  16 g  11  . Glucosamine-Chondroitin 750-600 MG TABS Take 1 tablet by mouth 2 (two) times daily.        Marland Kitchen levothyroxine (SYNTHROID, LEVOTHROID) 88 MCG tablet Take 88 mcg by mouth daily.        . montelukast (SINGULAIR) 10 MG tablet Take 10 mg by mouth daily.       . Multiple Vitamin (MULTIVITAMIN) tablet Take 1 tablet by mouth daily.        . potassium chloride SA (KLOR-CON M20) 20 MEQ tablet Take one tablet by mouth daily.  30 tablet  11  . triamterene-hydrochlorothiazide (MAXZIDE-25) 37.5-25 MG per tablet Take 2 each (2 tablets total) by mouth daily.  60 tablet  11  . verapamil (CALAN-SR) 240 MG CR tablet Take 1 tablet (240 mg total) by mouth 2 (two) times daily.  60 tablet  11  . QVAR 80 MCG/ACT inhaler daily.           Review of Systems    Review of Systems  Constitutional: Negative for fever, appetite change, fatigue and unexpected weight change.  Eyes: Negative for pain and visual disturbance.  Respiratory: Negative for cough and shortness of breath.   Cardiovascular: Negative for cp or palpitations    Gastrointestinal: Negative for nausea, diarrhea and constipation.  Genitourinary: Negative for urgency and frequency.  Skin: Negative for pallor or rash   Neurological: Negative for weakness, light-headedness, numbness and headaches.  Hematological: Negative for adenopathy. Does not bruise/bleed easily.  Psychiatric/Behavioral: Negative for dysphoric mood. The patient is not nervous/anxious.      Objective:   Physical Exam  Constitutional: She appears well-developed and well-nourished. No distress.       obese and well appearing   HENT:  Head: Normocephalic and atraumatic.  Right Ear: External ear normal.  Left Ear: External ear normal.  Nose: Nose normal.  Mouth/Throat:  Oropharynx is clear and moist. No oropharyngeal exudate.  Eyes: Conjunctivae normal and EOM are normal. Pupils are equal, round, and reactive to light. Right eye exhibits no discharge. Left eye exhibits no discharge. No  scleral icterus.  Neck: Normal range of motion. Neck supple. No JVD present. Carotid bruit is not present. No thyromegaly present.  Cardiovascular: Normal rate, regular rhythm, normal heart sounds and intact distal pulses.  Exam reveals no gallop.   Pulmonary/Chest: Effort normal. No respiratory distress. She has no wheezes.  Abdominal: Soft. Bowel sounds are normal. She exhibits no distension, no abdominal bruit and no mass. There is no tenderness.  Musculoskeletal: Normal range of motion. She exhibits no edema and no tenderness.  Lymphadenopathy:    She has no cervical adenopathy.  Neurological: She is alert. She has normal reflexes. No cranial nerve deficit. She exhibits normal muscle tone. Coordination normal.  Skin: Skin is warm and dry. No rash noted. No erythema. No pallor.  Psychiatric: She has a normal mood and affect.          Assessment & Plan:

## 2011-12-04 NOTE — Patient Instructions (Signed)
Tetanus shot and pneumonia shot today  Your tsh is a little high- I will send this to Dr Judie Petit Avoid red meat/ fried foods/ egg yolks/ fatty breakfast meats/ butter, cheese and high fat dairy/ and shellfish  = your cholesterol was up a bit For blood sugar - give up sugar in your tea and also work on weight loss Take care of yourself

## 2011-12-04 NOTE — Assessment & Plan Note (Signed)
Stable and mild  Enc to stop sugar in tea Also wt loss need stressed

## 2011-12-04 NOTE — Assessment & Plan Note (Signed)
Discussed how this problem influences overall health and the risks it imposes  Reviewed plan for weight loss with lower calorie diet (via better food choices and also portion control or program like weight watchers) and exercise building up to or more than 30 minutes 5 days per week including some aerobic activity    

## 2011-12-04 NOTE — Assessment & Plan Note (Signed)
bp up a bit- per pt much lower at home Rev labs Will continue to follow

## 2011-12-11 DIAGNOSIS — M542 Cervicalgia: Secondary | ICD-10-CM | POA: Diagnosis not present

## 2011-12-11 DIAGNOSIS — M545 Low back pain: Secondary | ICD-10-CM | POA: Diagnosis not present

## 2011-12-11 DIAGNOSIS — M999 Biomechanical lesion, unspecified: Secondary | ICD-10-CM | POA: Diagnosis not present

## 2011-12-11 DIAGNOSIS — M9981 Other biomechanical lesions of cervical region: Secondary | ICD-10-CM | POA: Diagnosis not present

## 2011-12-17 ENCOUNTER — Other Ambulatory Visit: Payer: Self-pay | Admitting: Family Medicine

## 2011-12-25 DIAGNOSIS — M9981 Other biomechanical lesions of cervical region: Secondary | ICD-10-CM | POA: Diagnosis not present

## 2011-12-25 DIAGNOSIS — M999 Biomechanical lesion, unspecified: Secondary | ICD-10-CM | POA: Diagnosis not present

## 2011-12-25 DIAGNOSIS — M545 Low back pain: Secondary | ICD-10-CM | POA: Diagnosis not present

## 2011-12-25 DIAGNOSIS — M542 Cervicalgia: Secondary | ICD-10-CM | POA: Diagnosis not present

## 2012-01-12 ENCOUNTER — Other Ambulatory Visit: Payer: Self-pay | Admitting: Family Medicine

## 2012-01-12 NOTE — Telephone Encounter (Signed)
Ok to refill 

## 2012-01-12 NOTE — Telephone Encounter (Signed)
Px written for call in   

## 2012-01-12 NOTE — Telephone Encounter (Signed)
Rx called in as prescribed 

## 2012-01-15 ENCOUNTER — Other Ambulatory Visit: Payer: Self-pay | Admitting: *Deleted

## 2012-01-15 DIAGNOSIS — M545 Low back pain: Secondary | ICD-10-CM | POA: Diagnosis not present

## 2012-01-15 DIAGNOSIS — M542 Cervicalgia: Secondary | ICD-10-CM | POA: Diagnosis not present

## 2012-01-15 DIAGNOSIS — M999 Biomechanical lesion, unspecified: Secondary | ICD-10-CM | POA: Diagnosis not present

## 2012-01-15 DIAGNOSIS — M9981 Other biomechanical lesions of cervical region: Secondary | ICD-10-CM | POA: Diagnosis not present

## 2012-02-26 DIAGNOSIS — Z79899 Other long term (current) drug therapy: Secondary | ICD-10-CM | POA: Diagnosis not present

## 2012-04-03 DIAGNOSIS — I10 Essential (primary) hypertension: Secondary | ICD-10-CM | POA: Diagnosis not present

## 2012-04-03 DIAGNOSIS — E890 Postprocedural hypothyroidism: Secondary | ICD-10-CM | POA: Diagnosis not present

## 2012-04-03 DIAGNOSIS — D34 Benign neoplasm of thyroid gland: Secondary | ICD-10-CM | POA: Diagnosis not present

## 2012-04-03 DIAGNOSIS — E0520 Thyrotoxicosis with toxic multinodular goiter without thyrotoxic crisis or storm: Secondary | ICD-10-CM | POA: Diagnosis not present

## 2012-04-08 DIAGNOSIS — M999 Biomechanical lesion, unspecified: Secondary | ICD-10-CM | POA: Diagnosis not present

## 2012-04-08 DIAGNOSIS — M546 Pain in thoracic spine: Secondary | ICD-10-CM | POA: Diagnosis not present

## 2012-04-10 DIAGNOSIS — E0520 Thyrotoxicosis with toxic multinodular goiter without thyrotoxic crisis or storm: Secondary | ICD-10-CM | POA: Diagnosis not present

## 2012-04-10 DIAGNOSIS — M546 Pain in thoracic spine: Secondary | ICD-10-CM | POA: Diagnosis not present

## 2012-04-10 DIAGNOSIS — T382X5A Adverse effect of antithyroid drugs, initial encounter: Secondary | ICD-10-CM | POA: Diagnosis not present

## 2012-04-10 DIAGNOSIS — E890 Postprocedural hypothyroidism: Secondary | ICD-10-CM | POA: Diagnosis not present

## 2012-04-10 DIAGNOSIS — D34 Benign neoplasm of thyroid gland: Secondary | ICD-10-CM | POA: Diagnosis not present

## 2012-04-10 DIAGNOSIS — M999 Biomechanical lesion, unspecified: Secondary | ICD-10-CM | POA: Diagnosis not present

## 2012-04-18 ENCOUNTER — Other Ambulatory Visit: Payer: Self-pay | Admitting: *Deleted

## 2012-04-18 MED ORDER — CARISOPRODOL 350 MG PO TABS: ORAL_TABLET | ORAL | Status: DC

## 2012-04-18 NOTE — Telephone Encounter (Signed)
Rx called in as prescribed 

## 2012-04-18 NOTE — Telephone Encounter (Signed)
Last filled 01/12/2012

## 2012-04-18 NOTE — Telephone Encounter (Signed)
Px written for call in   

## 2012-05-06 DIAGNOSIS — H40059 Ocular hypertension, unspecified eye: Secondary | ICD-10-CM | POA: Diagnosis not present

## 2012-05-27 DIAGNOSIS — Z79899 Other long term (current) drug therapy: Secondary | ICD-10-CM | POA: Diagnosis not present

## 2012-05-31 ENCOUNTER — Other Ambulatory Visit: Payer: Self-pay | Admitting: *Deleted

## 2012-05-31 MED ORDER — FLUTICASONE PROPIONATE 50 MCG/ACT NA SUSP: 2.0000 | Freq: Every day | NASAL | Status: DC

## 2012-06-05 DIAGNOSIS — M999 Biomechanical lesion, unspecified: Secondary | ICD-10-CM | POA: Diagnosis not present

## 2012-06-05 DIAGNOSIS — M546 Pain in thoracic spine: Secondary | ICD-10-CM | POA: Diagnosis not present

## 2012-06-05 DIAGNOSIS — M9981 Other biomechanical lesions of cervical region: Secondary | ICD-10-CM | POA: Diagnosis not present

## 2012-06-05 DIAGNOSIS — M545 Low back pain: Secondary | ICD-10-CM | POA: Diagnosis not present

## 2012-06-05 DIAGNOSIS — M542 Cervicalgia: Secondary | ICD-10-CM | POA: Diagnosis not present

## 2012-06-06 DIAGNOSIS — M542 Cervicalgia: Secondary | ICD-10-CM | POA: Diagnosis not present

## 2012-06-06 DIAGNOSIS — M999 Biomechanical lesion, unspecified: Secondary | ICD-10-CM | POA: Diagnosis not present

## 2012-06-06 DIAGNOSIS — M546 Pain in thoracic spine: Secondary | ICD-10-CM | POA: Diagnosis not present

## 2012-06-06 DIAGNOSIS — M9981 Other biomechanical lesions of cervical region: Secondary | ICD-10-CM | POA: Diagnosis not present

## 2012-06-06 DIAGNOSIS — M545 Low back pain: Secondary | ICD-10-CM | POA: Diagnosis not present

## 2012-06-07 ENCOUNTER — Other Ambulatory Visit: Payer: Self-pay | Admitting: Family Medicine

## 2012-06-07 NOTE — Telephone Encounter (Signed)
Please schedule f/u for fall and refill until then, thanks

## 2012-06-07 NOTE — Telephone Encounter (Signed)
Meds refilled, follow up appt scheduled for 11/24.

## 2012-06-07 NOTE — Telephone Encounter (Signed)
No recent appt and no future appt, ok to refill? 

## 2012-07-29 ENCOUNTER — Other Ambulatory Visit: Payer: Self-pay | Admitting: *Deleted

## 2012-07-29 DIAGNOSIS — M79609 Pain in unspecified limb: Secondary | ICD-10-CM | POA: Diagnosis not present

## 2012-07-29 DIAGNOSIS — B351 Tinea unguium: Secondary | ICD-10-CM | POA: Diagnosis not present

## 2012-07-29 MED ORDER — CARISOPRODOL 350 MG PO TABS: ORAL_TABLET | ORAL | Status: DC

## 2012-07-29 NOTE — Telephone Encounter (Signed)
Received faxed refill request from pharmacy. Last office 12/04/11. Is it okay to refill medication?

## 2012-07-29 NOTE — Telephone Encounter (Signed)
Px written for call in   

## 2012-07-29 NOTE — Telephone Encounter (Signed)
Rx called in as prescribed 

## 2012-07-30 DIAGNOSIS — M545 Low back pain: Secondary | ICD-10-CM | POA: Diagnosis not present

## 2012-07-30 DIAGNOSIS — M546 Pain in thoracic spine: Secondary | ICD-10-CM | POA: Diagnosis not present

## 2012-07-30 DIAGNOSIS — M542 Cervicalgia: Secondary | ICD-10-CM | POA: Diagnosis not present

## 2012-07-30 DIAGNOSIS — M9981 Other biomechanical lesions of cervical region: Secondary | ICD-10-CM | POA: Diagnosis not present

## 2012-07-30 DIAGNOSIS — M999 Biomechanical lesion, unspecified: Secondary | ICD-10-CM | POA: Diagnosis not present

## 2012-08-01 DIAGNOSIS — M999 Biomechanical lesion, unspecified: Secondary | ICD-10-CM | POA: Diagnosis not present

## 2012-08-01 DIAGNOSIS — M545 Low back pain: Secondary | ICD-10-CM | POA: Diagnosis not present

## 2012-08-01 DIAGNOSIS — M9981 Other biomechanical lesions of cervical region: Secondary | ICD-10-CM | POA: Diagnosis not present

## 2012-08-01 DIAGNOSIS — M542 Cervicalgia: Secondary | ICD-10-CM | POA: Diagnosis not present

## 2012-08-01 DIAGNOSIS — M546 Pain in thoracic spine: Secondary | ICD-10-CM | POA: Diagnosis not present

## 2012-08-05 DIAGNOSIS — M546 Pain in thoracic spine: Secondary | ICD-10-CM | POA: Diagnosis not present

## 2012-08-05 DIAGNOSIS — M545 Low back pain: Secondary | ICD-10-CM | POA: Diagnosis not present

## 2012-08-05 DIAGNOSIS — M9981 Other biomechanical lesions of cervical region: Secondary | ICD-10-CM | POA: Diagnosis not present

## 2012-08-05 DIAGNOSIS — M999 Biomechanical lesion, unspecified: Secondary | ICD-10-CM | POA: Diagnosis not present

## 2012-08-05 DIAGNOSIS — M542 Cervicalgia: Secondary | ICD-10-CM | POA: Diagnosis not present

## 2012-08-22 DIAGNOSIS — M545 Low back pain: Secondary | ICD-10-CM | POA: Diagnosis not present

## 2012-08-22 DIAGNOSIS — M999 Biomechanical lesion, unspecified: Secondary | ICD-10-CM | POA: Diagnosis not present

## 2012-08-26 ENCOUNTER — Ambulatory Visit: Payer: Self-pay | Admitting: Neurosurgery

## 2012-08-26 DIAGNOSIS — D320 Benign neoplasm of cerebral meninges: Secondary | ICD-10-CM | POA: Diagnosis not present

## 2012-08-26 DIAGNOSIS — M545 Low back pain: Secondary | ICD-10-CM | POA: Diagnosis not present

## 2012-08-26 DIAGNOSIS — M999 Biomechanical lesion, unspecified: Secondary | ICD-10-CM | POA: Diagnosis not present

## 2012-08-26 DIAGNOSIS — D321 Benign neoplasm of spinal meninges: Secondary | ICD-10-CM | POA: Diagnosis not present

## 2012-08-26 LAB — CREATININE, SERUM
EGFR (African American): >60
EGFR (Non-African Amer.): 55 — ABNORMAL LOW

## 2012-09-02 DIAGNOSIS — D320 Benign neoplasm of cerebral meninges: Secondary | ICD-10-CM | POA: Diagnosis not present

## 2012-09-22 ENCOUNTER — Other Ambulatory Visit: Payer: Self-pay | Admitting: Family Medicine

## 2012-09-24 ENCOUNTER — Other Ambulatory Visit: Payer: Self-pay

## 2012-09-24 DIAGNOSIS — Z1231 Encounter for screening mammogram for malignant neoplasm of breast: Secondary | ICD-10-CM

## 2012-09-26 DIAGNOSIS — J309 Allergic rhinitis, unspecified: Secondary | ICD-10-CM | POA: Diagnosis not present

## 2012-09-26 DIAGNOSIS — H1045 Other chronic allergic conjunctivitis: Secondary | ICD-10-CM | POA: Diagnosis not present

## 2012-09-26 DIAGNOSIS — J45909 Unspecified asthma, uncomplicated: Secondary | ICD-10-CM | POA: Diagnosis not present

## 2012-09-30 ENCOUNTER — Other Ambulatory Visit: Payer: Self-pay | Admitting: *Deleted

## 2012-09-30 MED ORDER — CARISOPRODOL 350 MG PO TABS: ORAL_TABLET | ORAL | Status: DC

## 2012-09-30 NOTE — Telephone Encounter (Signed)
Received faxed refill request from pharmacy. Patient has an appointment scheduled for 12/09/12. Is it okay to refill medication?

## 2012-09-30 NOTE — Telephone Encounter (Signed)
Rx called in as prescribed 

## 2012-09-30 NOTE — Telephone Encounter (Signed)
Px written for call in   Thanks  

## 2012-10-04 DIAGNOSIS — Z23 Encounter for immunization: Secondary | ICD-10-CM | POA: Diagnosis not present

## 2012-10-09 DIAGNOSIS — B351 Tinea unguium: Secondary | ICD-10-CM | POA: Diagnosis not present

## 2012-10-09 DIAGNOSIS — IMO0002 Reserved for concepts with insufficient information to code with codable children: Secondary | ICD-10-CM | POA: Diagnosis not present

## 2012-10-09 DIAGNOSIS — M545 Low back pain: Secondary | ICD-10-CM | POA: Diagnosis not present

## 2012-10-09 DIAGNOSIS — M79609 Pain in unspecified limb: Secondary | ICD-10-CM | POA: Diagnosis not present

## 2012-10-09 DIAGNOSIS — M999 Biomechanical lesion, unspecified: Secondary | ICD-10-CM | POA: Diagnosis not present

## 2012-10-10 DIAGNOSIS — M999 Biomechanical lesion, unspecified: Secondary | ICD-10-CM | POA: Diagnosis not present

## 2012-10-10 DIAGNOSIS — IMO0002 Reserved for concepts with insufficient information to code with codable children: Secondary | ICD-10-CM | POA: Diagnosis not present

## 2012-10-10 DIAGNOSIS — M545 Low back pain: Secondary | ICD-10-CM | POA: Diagnosis not present

## 2012-10-11 ENCOUNTER — Encounter: Payer: Self-pay | Admitting: Family Medicine

## 2012-10-11 ENCOUNTER — Ambulatory Visit (INDEPENDENT_AMBULATORY_CARE_PROVIDER_SITE_OTHER)
Admission: RE | Admit: 2012-10-11 | Discharge: 2012-10-11 | Disposition: A | Discharge status: Home / Self Care | Payer: Medicare Other | Source: Ambulatory Visit | Attending: Family Medicine | Admitting: Family Medicine

## 2012-10-11 ENCOUNTER — Ambulatory Visit (INDEPENDENT_AMBULATORY_CARE_PROVIDER_SITE_OTHER): Payer: Medicare Other | Admitting: Family Medicine

## 2012-10-11 VITALS — BP 142/86 | HR 94 | Temp 98.0°F | Wt 236.0 lb

## 2012-10-11 DIAGNOSIS — M545 Low back pain: Secondary | ICD-10-CM | POA: Diagnosis not present

## 2012-10-11 DIAGNOSIS — M47817 Spondylosis without myelopathy or radiculopathy, lumbosacral region: Secondary | ICD-10-CM | POA: Diagnosis not present

## 2012-10-11 MED ORDER — CARISOPRODOL 350 MG PO TABS: ORAL_TABLET | ORAL | Status: DC

## 2012-10-11 MED ORDER — TRAMADOL HCL 50 MG PO TABS: 50.0000 mg | ORAL_TABLET | Freq: Three times a day (TID) | ORAL | Status: DC | PRN

## 2012-10-11 NOTE — Patient Instructions (Addendum)
Xray now If symptoms worsen- let me know  We will call you with results  Use tramadol for pain with caution- it can sedate Use soma with caution of muscle spasm-also use caution

## 2012-10-11 NOTE — Progress Notes (Signed)
Subjective:    Patient ID: Amber Miles, female    DOB: 05-Oct-1945, 67 y.o.   MRN: [id number]  HPI Here with pain in hip   L buttock  started to hurt a bit  Then the pain worked its way down her leg  Cannot sleep Hurts to walk and stand more  More comfortable to sit   The pain is burning pain  Foot feels a bit numb and tingling L foot feels a little harder to lift toe (while walking) No bowel or bladder change  800 mg ibupr and arth tylenol no help Tried heat   She has a trip planned to [place] on [place] 5   Patient Active Problem List   Diagnosis Date Noted  . 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 ALLERGIC RHINITIS 01/28/2010  . HYPERGLYCEMIA 07/30/2009  . ANXIETY 11/30/2008  . HYPERLIPIDEMIA 04/13/2008  . HYPOTHYROIDISM 01/07/2008  . OBESITY 01/07/2008  . HYPERTENSION, BENIGN 01/07/2008  . 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 Hypothyroid   . Hyperlipidemia   . Hypertension   . Arthritis     OA knee replacement  . GERD (gastroesophageal reflux disease)   . Allergy     allergic rhinitis  . Reactive airways dysfunction syndrome   . Anxiety   . Skin lesions, generalized     squamous cell skin lesions and basal cell skin lesions   Past Surgical History  Procedure Laterality Date  . Tsa    . Abdominal hysterectomy  1990s    hyst with oophrectomy for B9lesion/cyst and prolapse  . Recocele      and cystocele repair  . 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 Smoking status: Never Smoker   . Smokeless tobacco: Not on file  . Alcohol Use: No   Family History  Problem Relation Age of Onset  . Hypertension Mother   . Stroke Mother   . Depression Mother   . Alzheimer's disease Mother   . Heart disease Father   . Hypertension Sister    Allergies  Allergen Reactions  . Neosporin [Neomycin-Polymyxin-Gramicidin] Other (See Comments)    blisters  . Tape Swelling     Itching and redness Pt said severe reaction to surgical tape.? latex   Current Outpatient Prescriptions on File Prior to Visit  Medication Sig Dispense Refill  . albuterol (PROAIR HFA) 108 (90 BASE) MCG/ACT inhaler Inhale 2 puffs into the lungs every 4 (four) hours as needed for wheezing.  1 Inhaler  5  . carisoprodol (SOMA) 350 MG tablet TAKE 1 TABLET BY MOUTH 3 TIMES DAILY AS NEEDED  30 tablet  0  . celecoxib (CELEBREX) 200 MG capsule Take 1 capsule (200 mg total) by mouth daily. With food as needed  30 capsule  11  . esomeprazole (NEXIUM) 40 MG capsule Take 1 capsule (40 mg total) by mouth daily before breakfast.  30 capsule  11  . Estradiol (VIVELLE-DOT TD) Change patch 2 times weekly       . fluticasone (FLONASE) 50 MCG/ACT nasal spray USE 2 SPRAYS IN EACH NOSTRIL ONCE A DAY  16 g  3  . Glucosamine-Chondroitin 750-600 MG TABS Take 1 tablet by mouth 2 (two) times daily.        Marland Kitchen levothyroxine (SYNTHROID, LEVOTHROID) 88 MCG tablet Take 88 mcg by mouth daily.        . montelukast (SINGULAIR) 10 MG tablet  Take 10 mg by mouth daily.       . Multiple Vitamin (MULTIVITAMIN) tablet Take 1 tablet by mouth daily.        Marland Kitchen NEXIUM 40 MG capsule TAKE ONE CAPSULE BY MOUTH EVERY MORNING BEFORE BREAKFAST  30 capsule  5  . PATADAY 0.2 % SOLN as needed.       . potassium chloride SA (KLOR-CON M20) 20 MEQ tablet TAKE ONE TABLET BY MOUTH DAILY.  30 tablet  5  . QVAR 80 MCG/ACT inhaler daily.      Marland Kitchen triamterene-hydrochlorothiazide (MAXZIDE-25) 37.5-25 MG per tablet TAKE 2 TABLETS BY MOUTH ONCE A DAY  60 tablet  5  . verapamil (CALAN-SR) 240 MG CR tablet Take 1 tablet (240 mg total) by mouth 2 (two) times daily.  60 tablet  11   No current facility-administered medications on file prior to visit.    Review of Systems Review of Systems  Constitutional: Negative for fever, appetite change, fatigue and unexpected weight change.  Eyes: Negative for pain and visual disturbance.  Respiratory: Negative for  cough and shortness of breath.   Cardiovascular: Negative for cp or palpitations    Gastrointestinal: Negative for nausea, diarrhea and constipation.  Genitourinary: Negative for urgency and frequency.  Skin: Negative for pallor or rash   MSK pos for L buttock/ low back and leg pain  Neurological: Negative for, light-headedness, numbness and headaches. pos for tingling in foot  Hematological: Negative for adenopathy. Does not bruise/bleed easily.  Psychiatric/Behavioral: Negative for dysphoric mood. The patient is not nervous/anxious.         Objective:   Physical Exam  Constitutional: She appears well-developed and well-nourished. No distress.  obese and well appearing   HENT:  Head: Normocephalic and atraumatic.  Mouth/Throat: Oropharynx is clear and moist.  Eyes: Conjunctivae and EOM are normal. Pupils are equal, round, and reactive to light.  Neck: Normal range of motion. Neck supple.  Cardiovascular: Regular rhythm.   Pulmonary/Chest: Effort normal and breath sounds normal.  Musculoskeletal: She exhibits tenderness. She exhibits no edema.  Tender over lower LS -bony tenderness Tender over L buttock musculature  Pos SLR on L  Nl rom hip  Nl gait- but slow due to pain   Neurological: She is alert. She has normal reflexes. She displays no atrophy. No sensory deficit. She exhibits normal muscle tone. She displays a negative Romberg sign. Gait normal.  Decreased L foot/ toe dorsiflexion (4/5) Otherwise normal    Skin: Skin is warm and dry. No rash noted.  Psychiatric: She has a normal mood and affect.          Assessment & Plan:

## 2012-10-13 NOTE — Assessment & Plan Note (Signed)
strongly suspect radiculopathy from LS on L - some dec strength to dorsflex L great toe and foot  subj sensation changes  Tenderness on spine Xray LS today  Tramadol for pain  Disc red flags to call for - bowel or bladder change

## 2012-10-14 ENCOUNTER — Telehealth: Payer: Self-pay | Admitting: Family Medicine

## 2012-10-14 DIAGNOSIS — M545 Low back pain: Secondary | ICD-10-CM

## 2012-10-14 NOTE — Telephone Encounter (Signed)
Putting in urgent referral now  She can increase tramadol to 2 pills every 8 hours if needed but watch closely for sedation

## 2012-10-14 NOTE — Telephone Encounter (Signed)
[facility] Triage Call Report Triage Record Num: [id number] Operator: Donna Bernard Patient Name: Amber Miles Call Date & Time: 10/12/2012 1:26:32PM Patient Phone: [phone] PCP: Audrie Gallus. [place] Patient Gender: Female PCP Fax : Patient DOB: February 26, 1945 Practice Name: [facility] Reason for Call: Caller: Aailyah/Patient; PCP: Roxy Manns [facility]); CB#: [phone]; Call regarding Pain going through leg; onset 10/08/2012 left hip pain and radiates to toes, rate 7/10 when ambulating, 2/10 at rest; saw Dr. Milinda Antis in office 10/11/2012 and prescribed Tramadol and Soma; emergent sxs r/o per "Leg Non-Injury" protocol except "All other situations" positive with disposition of "Provide Home/Self Care"; home care advice given, advised to take Tylenol per label instructions between doses of Tramadol. Protocol(s) Used: Leg Non-Injury Recommended Outcome per Protocol: Provide Home/Self Care Reason for Outcome: All other situations Care Advice: Wear comfortable, well-fitting shoes. Avoid wearing shoes with narrow toes or high heels. A good shoe choice is a well-fitted, padded walking shoe with wide toe box and arch supports. ~ ~ See provider if symptoms continue for 7 days with home care. ~ SYMPTOM / CONDITION MANAGEMENT ~ CAUTIONS Analgesic/Antipyretic Advice - Acetaminophen: Consider acetaminophen as directed on label or by pharmacist/provider for pain or fever PRECAUTIONS: - Use if there is no history of liver disease, alcoholism, or intake of three or more alcohol drinks per day - Only if approved by provider during pregnancy or when breastfeeding - During pregnancy, acetaminophen should not be taken more than 3 consecutive days without telling provider - Do not exceed recommended dose or frequency ~ MUSCLE CRAMP CARE: * Stretch the affected muscle (e.g., gently straighten the leg and pull the foot upward). Compress and massage the muscle to relax it. * Apply a cold pack  to reduce the muscle spasm and relax a tense muscle. Use a heating pad, hot water bottle or soak in a warm bath to relieve soreness. * PREVENT muscle cramps: - Do stretching exercises before and after workouts. - Do not over fatigue muscles. - Replace fluids and electrolytes lost during strenuous activity by drinking water or commercial sports drinks before and after exercising. ~ 10/12/2012 1:56:52PM Page 1 of 1 [id number]

## 2012-10-14 NOTE — Telephone Encounter (Signed)
Pt notified referral done and she can increase Tramadol to 2 tab q8hr prn (sedation caution)

## 2012-10-14 NOTE — Telephone Encounter (Signed)
I think she is going to need ortho eval since symptoms are so bad - let me know if agreeable (I had inquired how she felt in commends under xray)

## 2012-10-14 NOTE — Telephone Encounter (Signed)
Pt agrees with referral, sh said she is in a lot of pain and could you put the referral in as urgent, also pt wants to know if she is able to double up on any of her pain meds until she is seen because she said she isn't able to do anything because she is in so much pain, please advise

## 2012-10-15 DIAGNOSIS — IMO0002 Reserved for concepts with insufficient information to code with codable children: Secondary | ICD-10-CM | POA: Diagnosis not present

## 2012-10-16 ENCOUNTER — Other Ambulatory Visit: Payer: Self-pay

## 2012-10-16 MED ORDER — CARISOPRODOL 350 MG PO TABS: ORAL_TABLET | ORAL | Status: DC

## 2012-10-16 MED ORDER — TRAMADOL HCL 50 MG PO TABS: 50.0000 mg | ORAL_TABLET | Freq: Four times a day (QID) | ORAL | Status: DC | PRN

## 2012-10-16 NOTE — Telephone Encounter (Signed)
Please send to ortho note when it is ready  Px written for call in

## 2012-10-16 NOTE — Telephone Encounter (Signed)
Rx called in as prescribed, pt notified 

## 2012-10-16 NOTE — Telephone Encounter (Signed)
Pt is going out of town on 10/20/12 and returning on 10/28/12. Pt has enough Tramadol to last until 10/17/12. Pt request refill tramadol with increased quantity to last until returns on 10/28/12. Orthopedic dr told pt to take Tramadol 6 pills a day or  1 tabs q 4 hrs until returns but pt said Dr Milinda Antis had agreed to fill Tramadol so did not get rx from orthopedic. Pt also request Soma refilled with quantity to last until pt returns. Pt request call back when refilled. [name].

## 2012-10-28 ENCOUNTER — Ambulatory Visit
Admission: RE | Admit: 2012-10-28 | Discharge: 2012-10-28 | Disposition: A | Discharge status: Home / Self Care | Payer: Medicare Other | Source: Ambulatory Visit

## 2012-10-28 DIAGNOSIS — Z1231 Encounter for screening mammogram for malignant neoplasm of breast: Secondary | ICD-10-CM

## 2012-10-29 DIAGNOSIS — IMO0002 Reserved for concepts with insufficient information to code with codable children: Secondary | ICD-10-CM | POA: Diagnosis not present

## 2012-10-30 ENCOUNTER — Other Ambulatory Visit: Payer: Self-pay | Admitting: Family Medicine

## 2012-10-30 DIAGNOSIS — R928 Other abnormal and inconclusive findings on diagnostic imaging of breast: Secondary | ICD-10-CM

## 2012-11-07 DIAGNOSIS — M47817 Spondylosis without myelopathy or radiculopathy, lumbosacral region: Secondary | ICD-10-CM | POA: Diagnosis not present

## 2012-11-08 ENCOUNTER — Other Ambulatory Visit: Payer: Self-pay | Admitting: Family Medicine

## 2012-11-08 MED ORDER — TRAMADOL HCL 50 MG PO TABS: 100.0000 mg | ORAL_TABLET | Freq: Three times a day (TID) | ORAL | Status: DC | PRN

## 2012-11-08 NOTE — Telephone Encounter (Signed)
I think she takes 2 pills every 8 hours - and she needs 14 day supply to get her through  That would be 84 pills- will round it out to 90 I will write px for call in - I hope she feels better soon

## 2012-11-08 NOTE — Telephone Encounter (Addendum)
Rx called in as prescribed and left voicemail for pt letting her know Rx sent to pharmacy

## 2012-11-08 NOTE — Telephone Encounter (Signed)
Pt left vm stating that she had MRI yesterday and has an appt to see specialist on 11/6.  She is requesting enough tramadol to last until her appt.

## 2012-11-11 DIAGNOSIS — D485 Neoplasm of uncertain behavior of skin: Secondary | ICD-10-CM | POA: Diagnosis not present

## 2012-11-11 DIAGNOSIS — C44611 Basal cell carcinoma of skin of unspecified upper limb, including shoulder: Secondary | ICD-10-CM | POA: Diagnosis not present

## 2012-11-11 DIAGNOSIS — L820 Inflamed seborrheic keratosis: Secondary | ICD-10-CM | POA: Diagnosis not present

## 2012-11-11 DIAGNOSIS — Z85828 Personal history of other malignant neoplasm of skin: Secondary | ICD-10-CM | POA: Diagnosis not present

## 2012-11-11 DIAGNOSIS — L821 Other seborrheic keratosis: Secondary | ICD-10-CM | POA: Diagnosis not present

## 2012-11-11 DIAGNOSIS — D239 Other benign neoplasm of skin, unspecified: Secondary | ICD-10-CM | POA: Diagnosis not present

## 2012-11-11 DIAGNOSIS — L570 Actinic keratosis: Secondary | ICD-10-CM | POA: Diagnosis not present

## 2012-11-12 ENCOUNTER — Other Ambulatory Visit: Payer: Self-pay | Admitting: Family Medicine

## 2012-11-12 ENCOUNTER — Ambulatory Visit
Admission: RE | Admit: 2012-11-12 | Discharge: 2012-11-12 | Disposition: A | Discharge status: Home / Self Care | Payer: Medicare Other | Source: Ambulatory Visit | Attending: Family Medicine | Admitting: Family Medicine

## 2012-11-12 DIAGNOSIS — R928 Other abnormal and inconclusive findings on diagnostic imaging of breast: Secondary | ICD-10-CM

## 2012-11-15 ENCOUNTER — Other Ambulatory Visit: Payer: Self-pay | Admitting: Family Medicine

## 2012-11-15 ENCOUNTER — Ambulatory Visit: Admission: RE | Admit: 2012-11-15 | Payer: Medicare Other | Source: Ambulatory Visit

## 2012-11-15 ENCOUNTER — Ambulatory Visit
Admission: RE | Admit: 2012-11-15 | Discharge: 2012-11-15 | Disposition: A | Discharge status: Home / Self Care | Payer: Medicare Other | Source: Ambulatory Visit | Attending: Family Medicine | Admitting: Family Medicine

## 2012-11-15 DIAGNOSIS — N6019 Diffuse cystic mastopathy of unspecified breast: Secondary | ICD-10-CM | POA: Diagnosis not present

## 2012-11-15 DIAGNOSIS — R921 Mammographic calcification found on diagnostic imaging of breast: Secondary | ICD-10-CM

## 2012-11-15 DIAGNOSIS — R928 Other abnormal and inconclusive findings on diagnostic imaging of breast: Secondary | ICD-10-CM | POA: Diagnosis not present

## 2012-11-21 DIAGNOSIS — IMO0002 Reserved for concepts with insufficient information to code with codable children: Secondary | ICD-10-CM | POA: Diagnosis not present

## 2012-11-21 DIAGNOSIS — M5126 Other intervertebral disc displacement, lumbar region: Secondary | ICD-10-CM | POA: Diagnosis not present

## 2012-11-25 DIAGNOSIS — IMO0002 Reserved for concepts with insufficient information to code with codable children: Secondary | ICD-10-CM | POA: Diagnosis not present

## 2012-11-25 DIAGNOSIS — M5126 Other intervertebral disc displacement, lumbar region: Secondary | ICD-10-CM | POA: Diagnosis not present

## 2012-12-06 ENCOUNTER — Encounter: Payer: Self-pay | Admitting: Radiology

## 2012-12-09 ENCOUNTER — Ambulatory Visit (INDEPENDENT_AMBULATORY_CARE_PROVIDER_SITE_OTHER): Payer: Medicare Other | Admitting: Podiatry

## 2012-12-09 ENCOUNTER — Encounter: Payer: Self-pay | Admitting: Family Medicine

## 2012-12-09 ENCOUNTER — Encounter: Payer: Self-pay | Admitting: Podiatry

## 2012-12-09 ENCOUNTER — Ambulatory Visit (INDEPENDENT_AMBULATORY_CARE_PROVIDER_SITE_OTHER): Payer: Medicare Other | Admitting: Family Medicine

## 2012-12-09 VITALS — BP 134/80 | HR 84 | Temp 98.3°F | Ht 68.0 in | Wt 229.2 lb

## 2012-12-09 VITALS — BP 158/80 | HR 78 | Resp 16 | Ht 68.0 in | Wt 229.0 lb

## 2012-12-09 DIAGNOSIS — E785 Hyperlipidemia, unspecified: Secondary | ICD-10-CM

## 2012-12-09 DIAGNOSIS — E669 Obesity, unspecified: Secondary | ICD-10-CM

## 2012-12-09 DIAGNOSIS — R7309 Other abnormal glucose: Secondary | ICD-10-CM | POA: Diagnosis not present

## 2012-12-09 DIAGNOSIS — M79609 Pain in unspecified limb: Secondary | ICD-10-CM | POA: Diagnosis not present

## 2012-12-09 DIAGNOSIS — Z79899 Other long term (current) drug therapy: Secondary | ICD-10-CM | POA: Diagnosis not present

## 2012-12-09 DIAGNOSIS — I10 Essential (primary) hypertension: Secondary | ICD-10-CM

## 2012-12-09 DIAGNOSIS — B351 Tinea unguium: Secondary | ICD-10-CM | POA: Diagnosis not present

## 2012-12-09 LAB — CBC WITH DIFFERENTIAL/PLATELET
Basophils Relative: 0.8 % (ref 0.0–3.0)
Eosinophils Relative: 0.5 % (ref 0.0–5.0)
Hemoglobin: 15.7 g/dL — ABNORMAL HIGH (ref 12.0–15.0)
MCHC: 33.7 g/dL (ref 30.0–36.0)
MCV: 88.2 fl (ref 78.0–100.0)
Monocytes Absolute: 0.8 10*3/uL (ref 0.1–1.0)
Neutro Abs: 6.6 10*3/uL (ref 1.4–7.7)
Neutrophils Relative %: 70.6 % (ref 43.0–77.0)
RBC: 5.28 Mil/uL — ABNORMAL HIGH (ref 3.87–5.11)
WBC: 9.3 10*3/uL (ref 4.5–10.5)

## 2012-12-09 LAB — TSH: TSH: 6.45 u[IU]/mL — ABNORMAL HIGH (ref 0.35–5.50)

## 2012-12-09 LAB — COMPREHENSIVE METABOLIC PANEL
Albumin: 4.1 g/dL (ref 3.5–5.2)
Alkaline Phosphatase: 85 U/L (ref 39–117)
BUN: 20 mg/dL (ref 6–23)
CO2: 32 mEq/L (ref 19–32)
Calcium: 9.8 mg/dL (ref 8.4–10.5)
Chloride: 100 mEq/L (ref 96–112)
GFR: 52.51 mL/min — ABNORMAL LOW (ref >60.00)
Glucose, Bld: 86 mg/dL (ref 70–99)
Potassium: 3.6 mEq/L (ref 3.5–5.1)
Sodium: 137 mEq/L (ref 135–145)
Total Protein: 7.9 g/dL (ref 6.0–8.3)

## 2012-12-09 LAB — HEMOGLOBIN A1C: Hgb A1c MFr Bld: 5.8 % (ref 4.6–6.5)

## 2012-12-09 MED ORDER — ESTRADIOL 0.0375 MG/24HR TD PTTW: 1.0000 | MEDICATED_PATCH | TRANSDERMAL | Status: DC

## 2012-12-09 NOTE — Progress Notes (Signed)
Pre-visit discussion using our clinic review tool. No additional management support is needed unless otherwise documented below in the visit note.  

## 2012-12-09 NOTE — Assessment & Plan Note (Signed)
A1C today  Rev low glycemic diet and need for wt loss

## 2012-12-09 NOTE — Assessment & Plan Note (Signed)
Lipid labs today  Disc goals for lipids and reasons to control them Rev low sat fat diet in detail

## 2012-12-09 NOTE — Progress Notes (Signed)
Jacqueleen presents today stating that her back is doing much better but she can't cut her toenails.  Objective: Nails are thick yellow dystrophic onychomycotic and elongated painful on palpation as well as debridement.   Assessment: Pain in limb secondary to onychomycosis 1 through 5.  Plan: Debridement nails 1 through 5 bilateral covered service.

## 2012-12-09 NOTE — Progress Notes (Signed)
Subjective:    Patient ID: Amber Miles, female    DOB: 12/04/1945, 67 y.o.   MRN: [id number]  HPI Here for f/u of chronic health problems  Wt is down 7lb with bmi of 34 Habits- not been hungry / her appetite is down - and it could be med  She can feel it in her clothing   Back pain- had a shot that helped / and another one planned for December  She has been able to cut back on tramadol and soma  Not talking about surgery fortunately  Controlled substance  agreement-due for testing today= tramadol and soma (she is using less of it)  bp is stable today  No cp or palpitations or headaches or edema  No side effects to medicines  BP Readings from Last 3 Encounters:  12/09/12 134/80  10/11/12 142/86  12/04/11 158/74     Hyperlipidemia  Due for labs Lab Results  Component Value Date   CHOL 187 11/24/2011   HDL 45.50 11/24/2011   LDLCALC 114* 11/24/2011   TRIG 139.0 11/24/2011   CHOLHDL 4 11/24/2011   drank milk today-that is all she had     Hyperglycemia Drinking more water and less sweet tea  Due for lab  No symptoms Has lost wt   Sees Dr Judie Petit for thyroid -no problems and she will see him in Feb   Patient Active Problem List   Diagnosis Date Noted  . Low back pain potentially associated with radiculopathy 10/11/2012  . 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 ALLERGIC RHINITIS 01/28/2010  . HYPERGLYCEMIA 07/30/2009  . ANXIETY 11/30/2008  . HYPERLIPIDEMIA 04/13/2008  . HYPOTHYROIDISM 01/07/2008  . OBESITY 01/07/2008  . HYPERTENSION, BENIGN 01/07/2008  . 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 Hypothyroid   . Hyperlipidemia   . Hypertension   . Arthritis     OA knee replacement  . GERD (gastroesophageal reflux disease)   . Allergy     allergic rhinitis  . Reactive airways dysfunction syndrome   . Anxiety   . Skin lesions, generalized     squamous cell skin lesions and basal cell skin lesions   Past Surgical History   Procedure Laterality Date  . Tsa    . Abdominal hysterectomy  1990s    hyst with oophrectomy for B9lesion/cyst and prolapse  . Recocele      and cystocele repair  . 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 Smoking status: Never Smoker   . Smokeless tobacco: Not on file  . Alcohol Use: No   Family History  Problem Relation Age of Onset  . Hypertension Mother   . Stroke Mother   . Depression Mother   . Alzheimer's disease Mother   . Heart disease Father   . Hypertension Sister    Allergies  Allergen Reactions  . Neosporin [Neomycin-Polymyxin-Gramicidin] Other (See Comments)    blisters  . Tape Swelling    Itching and redness Pt said severe reaction to surgical tape.? latex   Current Outpatient Prescriptions on File Prior to Visit  Medication Sig Dispense Refill  . albuterol (PROAIR HFA) 108 (90 BASE) MCG/ACT inhaler Inhale 2 puffs into the lungs every 4 (four) hours as needed for wheezing.  1 Inhaler  5  . carisoprodol (SOMA) 350 MG tablet TAKE 1 TABLET BY MOUTH 3 TIMES DAILY AS NEEDED  90 tablet  0  . celecoxib (CELEBREX)  200 MG capsule Take 1 capsule (200 mg total) by mouth daily. With food as needed  30 capsule  11  . Estradiol (VIVELLE-DOT TD) Change patch 2 times weekly       . Glucosamine-Chondroitin 750-600 MG TABS Take 1 tablet by mouth 2 (two) times daily.        Marland Kitchen levothyroxine (SYNTHROID, LEVOTHROID) 88 MCG tablet Take 88 mcg by mouth daily.        . montelukast (SINGULAIR) 10 MG tablet Take 10 mg by mouth daily.       . Multiple Vitamin (MULTIVITAMIN) tablet Take 1 tablet by mouth daily.        Marland Kitchen NEXIUM 40 MG capsule TAKE ONE CAPSULE BY MOUTH EVERY MORNING BEFORE BREAKFAST  30 capsule  0  . potassium chloride SA (K-DUR,KLOR-CON) 20 MEQ tablet TAKE ONE TABLET BY MOUTH DAILY.  30 tablet  0  . QVAR 80 MCG/ACT inhaler daily.      . traMADol (ULTRAM) 50 MG tablet Take 2 tablets (100 mg total) by mouth every 8  (eight) hours as needed for pain.  90 tablet  0  . triamterene-hydrochlorothiazide (MAXZIDE-25) 37.5-25 MG per tablet TAKE 2 TABLETS BY MOUTH ONCE A DAY  60 tablet  0  . verapamil (CALAN-SR) 240 MG CR tablet TAKE 1 TABLET (240 MG TOTAL) BY MOUTH 2 (TWO) TIMES DAILY.  60 tablet  0   No current facility-administered medications on file prior to visit.     Review of Systems Review of Systems  Constitutional: Negative for fever, appetite change, fatigue and unexpected weight change.  Eyes: Negative for pain and visual disturbance.  Respiratory: Negative for cough and shortness of breath.   Cardiovascular: Negative for cp or palpitations    Gastrointestinal: Negative for nausea, diarrhea and constipation.  Genitourinary: Negative for urgency and frequency.  Skin: Negative for pallor or rash   MSK pos for back pain that rad to leg  Neurological: Negative for weakness, light-headedness, numbness and headaches.  Hematological: Negative for adenopathy. Does not bruise/bleed easily.  Psychiatric/Behavioral: Negative for dysphoric mood. The patient is not nervous/anxious.         Objective:   Physical Exam  Constitutional: She appears well-developed and well-nourished. No distress.  HENT:  Head: Normocephalic and atraumatic.  Right Ear: External ear normal.  Left Ear: External ear normal.  Nose: Nose normal.  Mouth/Throat: Oropharynx is clear and moist.  Eyes: Conjunctivae and EOM are normal. Pupils are equal, round, and reactive to light. Right eye exhibits no discharge. Left eye exhibits no discharge. No scleral icterus.  Neck: Normal range of motion. Neck supple. No JVD present. Carotid bruit is not present. No thyromegaly present.  Cardiovascular: Normal rate, regular rhythm, normal heart sounds and intact distal pulses.  Exam reveals no gallop.   Pulmonary/Chest: Effort normal and breath sounds normal. No respiratory distress. She has no wheezes. She has no rales.  Abdominal: Soft.  Bowel sounds are normal.  Musculoskeletal: She exhibits no edema.  Lymphadenopathy:    She has no cervical adenopathy.  Neurological: She is alert. She has normal reflexes. No cranial nerve deficit. She exhibits normal muscle tone. Coordination normal.  Skin: Skin is warm and dry. No rash noted. No erythema. No pallor.  Psychiatric: She has a normal mood and affect.          Assessment & Plan:

## 2012-12-09 NOTE — Patient Instructions (Signed)
Lab today  Follow up in feb as planned  Try to stay off the sweet tea and keep loosing weight  I refilled your vivelle

## 2012-12-09 NOTE — Assessment & Plan Note (Signed)
Discussed how this problem influences overall health and the risks it imposes  Reviewed plan for weight loss with lower calorie diet (via better food choices and also portion control or program like weight watchers) and exercise building up to or more than 30 minutes 5 days per week including some aerobic activity   Wt loss would also help back pain

## 2012-12-09 NOTE — Assessment & Plan Note (Signed)
BP: 134/80 mmHg  bp in fair control at this time  No changes needed Disc lifstyle change with low sodium diet and exercise   Lab today

## 2012-12-10 ENCOUNTER — Other Ambulatory Visit: Payer: Self-pay | Admitting: Family Medicine

## 2012-12-10 ENCOUNTER — Encounter: Payer: Self-pay | Admitting: *Deleted

## 2012-12-10 NOTE — Telephone Encounter (Signed)
Please refill for 6 mo 

## 2012-12-10 NOTE — Telephone Encounter (Signed)
Electronic refill request, please advise  

## 2012-12-11 NOTE — Telephone Encounter (Signed)
done

## 2012-12-13 ENCOUNTER — Other Ambulatory Visit: Payer: Self-pay | Admitting: *Deleted

## 2012-12-13 MED ORDER — VERAPAMIL HCL ER 240 MG PO TBCR: EXTENDED_RELEASE_TABLET | ORAL | Status: DC

## 2012-12-13 MED ORDER — ESOMEPRAZOLE MAGNESIUM 40 MG PO CPDR: DELAYED_RELEASE_CAPSULE | ORAL | Status: DC

## 2012-12-13 MED ORDER — TRIAMTERENE-HCTZ 37.5-25 MG PO TABS: ORAL_TABLET | ORAL | Status: DC

## 2012-12-13 MED ORDER — POTASSIUM CHLORIDE CRYS ER 20 MEQ PO TBCR: EXTENDED_RELEASE_TABLET | ORAL | Status: DC

## 2012-12-27 ENCOUNTER — Encounter: Payer: Self-pay | Admitting: Family Medicine

## 2012-12-29 ENCOUNTER — Other Ambulatory Visit: Payer: Self-pay | Admitting: Family Medicine

## 2013-01-03 DIAGNOSIS — IMO0002 Reserved for concepts with insufficient information to code with codable children: Secondary | ICD-10-CM | POA: Diagnosis not present

## 2013-01-03 DIAGNOSIS — M5126 Other intervertebral disc displacement, lumbar region: Secondary | ICD-10-CM | POA: Diagnosis not present

## 2013-01-27 DIAGNOSIS — IMO0002 Reserved for concepts with insufficient information to code with codable children: Secondary | ICD-10-CM | POA: Diagnosis not present

## 2013-01-27 DIAGNOSIS — M5126 Other intervertebral disc displacement, lumbar region: Secondary | ICD-10-CM | POA: Diagnosis not present

## 2013-02-05 ENCOUNTER — Other Ambulatory Visit: Payer: Self-pay | Admitting: Family Medicine

## 2013-02-10 ENCOUNTER — Ambulatory Visit (INDEPENDENT_AMBULATORY_CARE_PROVIDER_SITE_OTHER): Payer: Medicare Other | Admitting: Podiatry

## 2013-02-10 ENCOUNTER — Encounter: Payer: Self-pay | Admitting: Podiatry

## 2013-02-10 VITALS — BP 151/76 | HR 67 | Resp 16 | Ht 70.0 in | Wt 223.0 lb

## 2013-02-10 DIAGNOSIS — B351 Tinea unguium: Secondary | ICD-10-CM | POA: Diagnosis not present

## 2013-02-10 DIAGNOSIS — M79609 Pain in unspecified limb: Secondary | ICD-10-CM

## 2013-02-10 NOTE — Progress Notes (Signed)
Amber Miles presents today with a chief complaint of painful E. elongated toenails and what appears to be another fungal infection.  Objective: Vital signs are stable she is alert and oriented x3 her nails are thick dystrophic clinically mycotic painful palpation.  Assessment: Painfully elongated sharply incurvated nails bilateral. Nails are thick and mycotic.  Plan: Debridement of nails 1 through 5 bilateral.

## 2013-02-18 ENCOUNTER — Other Ambulatory Visit: Payer: Self-pay | Admitting: Family Medicine

## 2013-02-21 ENCOUNTER — Ambulatory Visit (INDEPENDENT_AMBULATORY_CARE_PROVIDER_SITE_OTHER): Payer: Medicare Other | Admitting: Family Medicine

## 2013-02-21 ENCOUNTER — Encounter: Payer: Self-pay | Admitting: Family Medicine

## 2013-02-21 VITALS — BP 158/84 | HR 67 | Temp 97.5°F | Ht 67.5 in | Wt 227.0 lb

## 2013-02-21 DIAGNOSIS — E669 Obesity, unspecified: Secondary | ICD-10-CM | POA: Diagnosis not present

## 2013-02-21 DIAGNOSIS — E039 Hypothyroidism, unspecified: Secondary | ICD-10-CM | POA: Diagnosis not present

## 2013-02-21 DIAGNOSIS — Z0000 Encounter for general adult medical examination without abnormal findings: Secondary | ICD-10-CM | POA: Insufficient documentation

## 2013-02-21 DIAGNOSIS — E2839 Other primary ovarian failure: Secondary | ICD-10-CM | POA: Diagnosis not present

## 2013-02-21 DIAGNOSIS — E785 Hyperlipidemia, unspecified: Secondary | ICD-10-CM

## 2013-02-21 DIAGNOSIS — I10 Essential (primary) hypertension: Secondary | ICD-10-CM

## 2013-02-21 NOTE — Progress Notes (Signed)
Subjective:    Patient ID: Amber Miles, female    DOB: October 22, 1945, 68 y.o.   MRN: [id number]  HPI I have personally reviewed the Medicare Annual Wellness questionnaire and have noted 1. The patient's medical and social history 2. Their use of alcohol, tobacco or illicit drugs 3. Their current medications and supplements 4. The patient's functional ability including ADL's, fall risks, home safety risks and hearing or visual             impairment. 5. Diet and physical activities 6. Evidence for depression or mood disorders  The patients weight, height, BMI have been recorded in the chart and visual acuity is per eye clinic.  I have made referrals, counseling and provided education to the patient based review of the above and I have provided the pt with a written personalized care plan for preventive services.    See scanned forms.  Routine anticipatory guidance given to patient.  See health maintenance. Flu 9/14 vaccine  Shingles 9/12 vaccine  PNA 11/13 vaccine  Tetanus vaccine 1/13 Colonoscopy 7/12  - gets that every 5 years  Breast cancer screening- had mammogram 10/14 with bx (normal -fibrocystic)  Self exam - no lumps / still red at bx site  Advance directive - has a living will  Cognitive function addressed- see scanned forms- and if abnormal then additional documentation follows. - no major concerns with memory   PMH and SH reviewed  Meds, vitals, and allergies reviewed.   ROS: See HPI.  Otherwise negative.    HTN BP Readings from Last 3 Encounters:  02/21/13 158/84  02/10/13 151/76  12/09/12 158/80  at home -her bp runs usually 130s/ 80s - will bring her cuff to next visit   Wt is up 4 lb Obese- working on diet / she likes vegetables - making more soups  occ junk food however  Has back and shoulder problems - keep her from exercising  She needs to get into ortho about her shoulder    Hypothyroid No symptoms  Lab Results  Component Value Date   TSH 6.45*  12/09/2012  she sees Dr Karel Jarvis in March- she has thyroid US frequently for nodules    dexa screen heel 06 normal  Has not had one since then  She takes mvi  Drinks a lot of milk  No ca or D extra   Dr Carren Rang said she needs to be on HRT for lifetime due to all of her bladder tach and incontinence issues  Insurance will no longer cover She will pay out of pocket   She takes celebrex - long time  Ins will no longer cover it  Has not tried aleve otc or other nsaid  No side effect  She wants to pay out of pocket  Aware of pot side eff   Lab Results  Component Value Date   CHOL 190 12/09/2012   CHOL 187 11/24/2011   CHOL 177 10/18/2010   Lab Results  Component Value Date   HDL 48.00 12/09/2012   HDL 45.50 11/24/2011   HDL 41.50 10/18/2010   Lab Results  Component Value Date   LDLCALC 112* 12/09/2012   LDLCALC 114* 11/24/2011   LDLCALC 99 10/18/2010   Lab Results  Component Value Date   TRIG 152.0* 12/09/2012   TRIG 139.0 11/24/2011   TRIG 185.0* 10/18/2010   Lab Results  Component Value Date   CHOLHDL 4 12/09/2012   CHOLHDL 4 11/24/2011   CHOLHDL 4 10/18/2010  No results found for this basename: LDLDIRECT    Lab Results  Component Value Date   WBC 9.3 12/09/2012   HGB 15.7* 12/09/2012   HCT 46.5* 12/09/2012   MCV 88.2 12/09/2012   PLT 289.0 12/09/2012      Chemistry      Component Value Date/Time   NA 137 12/09/2012 1156   K 3.6 12/09/2012 1156   CL 100 12/09/2012 1156   CO2 32 12/09/2012 1156   BUN 20 12/09/2012 1156   CREATININE 1.1 12/09/2012 1156      Component Value Date/Time   CALCIUM 9.8 12/09/2012 1156   ALKPHOS 85 12/09/2012 1156   AST 21 12/09/2012 1156   ALT 19 12/09/2012 1156   BILITOT 0.5 12/09/2012 1156        Patient Active Problem List   Diagnosis Date Noted  . Low back pain potentially associated with radiculopathy 10/11/2012  . 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 ALLERGIC RHINITIS 01/28/2010  . HYPERGLYCEMIA 07/30/2009  .  ANXIETY 11/30/2008  . HYPERLIPIDEMIA 04/13/2008  . HYPOTHYROIDISM 01/07/2008  . OBESITY 01/07/2008  . HYPERTENSION, BENIGN 01/07/2008  . 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 Hypothyroid   . Hyperlipidemia   . Hypertension   . Arthritis     OA knee replacement  . GERD (gastroesophageal reflux disease)   . Allergy     allergic rhinitis  . Reactive airways dysfunction syndrome   . Anxiety   . Skin lesions, generalized     squamous cell skin lesions and basal cell skin lesions   Past Surgical History  Procedure Laterality Date  . Tsa    . Abdominal hysterectomy  1990s    hyst with oophrectomy for B9lesion/cyst and prolapse  . Recocele      and cystocele repair  . 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 Smoking status: Never Smoker   . Smokeless tobacco: Never Used  . Alcohol Use: No   Family History  Problem Relation Age of Onset  . Hypertension Mother   . Stroke Mother   . Depression Mother   . Alzheimer's disease Mother   . Heart disease Father   . Hypertension Sister    Allergies  Allergen Reactions  . Neosporin [Neomycin-Polymyxin-Gramicidin] Other (See Comments)    blisters  . Tape Swelling    Itching and redness Pt said severe reaction to surgical tape.? latex   Current Outpatient Prescriptions on File Prior to Visit  Medication Sig Dispense Refill  . carisoprodol (SOMA) 350 MG tablet TAKE 1 TABLET BY MOUTH 3 TIMES DAILY AS NEEDED  90 tablet  0  . CELEBREX 200 MG capsule TAKE 1 CAPSULE (200 MG TOTAL) BY MOUTH DAILY. WITH FOOD AS NEEDED  30 capsule  5  . esomeprazole (NEXIUM) 40 MG capsule TAKE ONE CAPSULE BY MOUTH EVERY MORNING BEFORE BREAKFAST  30 capsule  6  . estradiol (VIVELLE-DOT) 0.0375 MG/24HR Place 1 patch onto the skin 2 (two) times a week.  8 patch  11  . Glucosamine-Chondroitin 750-600 MG TABS Take 1 tablet by mouth 2 (two) times daily.        Marland Kitchen  levothyroxine (SYNTHROID, LEVOTHROID) 88 MCG tablet Take 88 mcg by mouth daily.        . montelukast (SINGULAIR) 10 MG tablet Take 10 mg by mouth daily.       . Multiple Vitamin (MULTIVITAMIN) tablet Take 1 tablet  by mouth daily.        . Olopatadine HCl (PATANASE NA) Place 2 sprays into the nose 2 (two) times daily.      . potassium chloride SA (K-DUR,KLOR-CON) 20 MEQ tablet TAKE ONE TABLET BY MOUTH DAILY.  30 tablet  6  . PROAIR HFA 108 (90 BASE) MCG/ACT inhaler USE 2 PUFFS EVERY 4 HOURS AS NEEDED FOR WHEEZING  8.5 each  0  . QVAR 80 MCG/ACT inhaler daily.      . traMADol (ULTRAM) 50 MG tablet Take 2 tablets (100 mg total) by mouth every 8 (eight) hours as needed for pain.  90 tablet  0  . triamterene-hydrochlorothiazide (MAXZIDE-25) 37.5-25 MG per tablet TAKE 2 TABLETS BY MOUTH ONCE A DAY  60 tablet  6  . verapamil (CALAN-SR) 240 MG CR tablet TAKE 1 TABLET (240 MG TOTAL) BY MOUTH 2 (TWO) TIMES DAILY.  60 tablet  6   No current facility-administered medications on file prior to visit.    Review of Systems Review of Systems  Constitutional: Negative for fever, appetite change, fatigue and unexpected weight change.  Eyes: Negative for pain and visual disturbance.  Respiratory: Negative for cough and shortness of breath.   Cardiovascular: Negative for cp or palpitations    Gastrointestinal: Negative for nausea, diarrhea and constipation.  Genitourinary: Negative for urgency and frequency.  Skin: Negative for pallor or rash   MSK pos for shoulder pain  Neurological: Negative for weakness, light-headedness, numbness and headaches.  Hematological: Negative for adenopathy. Does not bruise/bleed easily.  Psychiatric/Behavioral: Negative for dysphoric mood. The patient is not nervous/anxious.         Objective:   Physical Exam  Constitutional: She appears well-developed and well-nourished. No distress.  obese and well appearing   HENT:  Head: Normocephalic and atraumatic.  Right Ear:  External ear normal.  Left Ear: External ear normal.  Mouth/Throat: Oropharynx is clear and moist.  Eyes: Conjunctivae and EOM are normal. Pupils are equal, round, and reactive to light. No scleral icterus.  Neck: Normal range of motion. Neck supple. No JVD present. Carotid bruit is not present. No thyromegaly present.  Cardiovascular: Normal rate, regular rhythm, normal heart sounds and intact distal pulses.  Exam reveals no gallop.   Pulmonary/Chest: Effort normal and breath sounds normal. No respiratory distress. She has no wheezes. She exhibits no tenderness.  Abdominal: Soft. Bowel sounds are normal. She exhibits no distension, no abdominal bruit and no mass. There is no tenderness.  Genitourinary: No breast swelling, tenderness, discharge or bleeding.  Breast exam: No mass, nodules, thickening, tenderness, bulging, retraction, inflamation, nipple discharge or skin changes noted.  No axillary or clavicular LA.   Musculoskeletal: She exhibits no edema and no tenderness.  Poor rom shoulders  Lymphadenopathy:    She has no cervical adenopathy.  Neurological: She is alert. She has normal reflexes. No cranial nerve deficit. She exhibits normal muscle tone. Coordination normal.  Skin: Skin is warm and dry. No rash noted. No erythema. No pallor.  Lentigo diff  Psychiatric: She has a normal mood and affect.          Assessment & Plan:

## 2013-02-21 NOTE — Progress Notes (Signed)
Pre-visit discussion using our clinic review tool. No additional management support is needed unless otherwise documented below in the visit note.  

## 2013-02-21 NOTE — Patient Instructions (Addendum)
Bring you blood pressure cuff to the next visit so we can see how accurate it is  Try to get 1200-1500 mg of calcium per day with at least 1000 iu of vitamin D - for bone health  Get back to orthopedics to check out your shoulder (so you can start exercising)  We will refer you for a bone density test at check out  We will send labs to Dr Karel Jarvis

## 2013-02-23 NOTE — Assessment & Plan Note (Signed)
Discussed how this problem influences overall health and the risks it imposes  Reviewed plan for weight loss with lower calorie diet (via better food choices and also portion control or program like weight watchers) and exercise building up to or more than 30 minutes 5 days per week including some aerobic activity   Pt will go back to ortho for shoulder issues so she can get pain under control- get back to exercise

## 2013-02-23 NOTE — Assessment & Plan Note (Signed)
BP: 158/84 mmHg  bp in fair control at this time at home - asked her to bring her cuff to the next visit  Pt thinks she has whitecoat syndrome  No changes needed Disc lifstyle change with low sodium diet and exercise   Wt loss encourages

## 2013-02-23 NOTE — Assessment & Plan Note (Signed)
Disc goals for lipids and reasons to control them Rev labs with pt Rev low sat fat diet in detail   

## 2013-02-23 NOTE — Assessment & Plan Note (Signed)
Reviewed health habits including diet and exercise and skin cancer prevention Reviewed appropriate screening tests for age  Also reviewed health mt list, fam hx and immunization status , as well as social and family history   See HPI Labs reviewed  

## 2013-02-23 NOTE — Assessment & Plan Note (Signed)
tsh is high Will f/u with Dr Jerilynn Mages Lab Results  Component Value Date   TSH 6.45* 12/09/2012

## 2013-02-23 NOTE — Assessment & Plan Note (Signed)
Ref for dexa  Pt has GU issues / incontinence/ atrophic change due to this - per pt her gyn said she would need to be on lifelong hrt  Rev pros/cons/risks in detail with pt  She is aware ins no longer covers it and is willing to pay out of pocket

## 2013-02-24 ENCOUNTER — Telehealth: Payer: Self-pay | Admitting: Family Medicine

## 2013-02-24 NOTE — Telephone Encounter (Signed)
Relevant patient education mailed to patient.  

## 2013-02-28 DIAGNOSIS — M7530 Calcific tendinitis of unspecified shoulder: Secondary | ICD-10-CM | POA: Diagnosis not present

## 2013-03-05 ENCOUNTER — Other Ambulatory Visit: Payer: Medicare Other

## 2013-03-14 ENCOUNTER — Other Ambulatory Visit: Payer: Medicare Other

## 2013-03-31 ENCOUNTER — Ambulatory Visit
Admission: RE | Admit: 2013-03-31 | Discharge: 2013-03-31 | Disposition: A | Discharge status: Home / Self Care | Payer: Medicare Other | Source: Ambulatory Visit | Attending: Family Medicine | Admitting: Family Medicine

## 2013-03-31 DIAGNOSIS — Z780 Asymptomatic menopausal state: Secondary | ICD-10-CM | POA: Diagnosis not present

## 2013-03-31 DIAGNOSIS — E2839 Other primary ovarian failure: Secondary | ICD-10-CM

## 2013-03-31 LAB — HM DEXA SCAN: HM Dexa Scan: NORMAL

## 2013-04-07 ENCOUNTER — Encounter: Payer: Self-pay | Admitting: *Deleted

## 2013-04-07 ENCOUNTER — Encounter: Payer: Self-pay | Admitting: Family Medicine

## 2013-04-10 DIAGNOSIS — E0520 Thyrotoxicosis with toxic multinodular goiter without thyrotoxic crisis or storm: Secondary | ICD-10-CM | POA: Diagnosis not present

## 2013-04-10 DIAGNOSIS — E890 Postprocedural hypothyroidism: Secondary | ICD-10-CM | POA: Diagnosis not present

## 2013-04-10 DIAGNOSIS — N959 Unspecified menopausal and perimenopausal disorder: Secondary | ICD-10-CM | POA: Diagnosis not present

## 2013-04-10 DIAGNOSIS — D34 Benign neoplasm of thyroid gland: Secondary | ICD-10-CM | POA: Diagnosis not present

## 2013-04-10 DIAGNOSIS — J45909 Unspecified asthma, uncomplicated: Secondary | ICD-10-CM | POA: Diagnosis not present

## 2013-04-10 DIAGNOSIS — I10 Essential (primary) hypertension: Secondary | ICD-10-CM | POA: Diagnosis not present

## 2013-04-10 DIAGNOSIS — K219 Gastro-esophageal reflux disease without esophagitis: Secondary | ICD-10-CM | POA: Diagnosis not present

## 2013-04-10 DIAGNOSIS — E042 Nontoxic multinodular goiter: Secondary | ICD-10-CM | POA: Diagnosis not present

## 2013-04-14 DIAGNOSIS — J45909 Unspecified asthma, uncomplicated: Secondary | ICD-10-CM | POA: Diagnosis not present

## 2013-04-14 DIAGNOSIS — N959 Unspecified menopausal and perimenopausal disorder: Secondary | ICD-10-CM | POA: Diagnosis not present

## 2013-04-14 DIAGNOSIS — E042 Nontoxic multinodular goiter: Secondary | ICD-10-CM | POA: Diagnosis not present

## 2013-04-14 DIAGNOSIS — K219 Gastro-esophageal reflux disease without esophagitis: Secondary | ICD-10-CM | POA: Diagnosis not present

## 2013-04-14 DIAGNOSIS — E0520 Thyrotoxicosis with toxic multinodular goiter without thyrotoxic crisis or storm: Secondary | ICD-10-CM | POA: Diagnosis not present

## 2013-04-14 DIAGNOSIS — E890 Postprocedural hypothyroidism: Secondary | ICD-10-CM | POA: Diagnosis not present

## 2013-04-14 DIAGNOSIS — D34 Benign neoplasm of thyroid gland: Secondary | ICD-10-CM | POA: Diagnosis not present

## 2013-04-14 DIAGNOSIS — I10 Essential (primary) hypertension: Secondary | ICD-10-CM | POA: Diagnosis not present

## 2013-05-05 ENCOUNTER — Ambulatory Visit (INDEPENDENT_AMBULATORY_CARE_PROVIDER_SITE_OTHER): Payer: Medicare Other | Admitting: Podiatry

## 2013-05-05 VITALS — Resp 16 | Ht 67.0 in | Wt 220.0 lb

## 2013-05-05 DIAGNOSIS — B351 Tinea unguium: Secondary | ICD-10-CM | POA: Diagnosis not present

## 2013-05-05 DIAGNOSIS — M79609 Pain in unspecified limb: Secondary | ICD-10-CM | POA: Diagnosis not present

## 2013-05-05 DIAGNOSIS — M2040 Other hammer toe(s) (acquired), unspecified foot: Secondary | ICD-10-CM

## 2013-05-05 NOTE — Progress Notes (Signed)
She presents today for routine nail debridement. She's also complaining of painful third toe on the left foot.  Objective: Vital signs are stable she is alert and oriented x3 pulses are palpable dorsal aspect of the bilateral foot she also has a ganglion cyst dorsal aspect of the right foot. Mild mallet toe deformity third left. Nails are thick yellow dystrophic with mycotic painful palpation with sharp incurvated nail margins.  Assessment: Pain in limb secondary to onychomycosis 1 through 5 bilateral.  Plan: Debridement of nails 1 through 5 bilateral. Dispensed a crest pad for the left foot. We will followup with her in 3 months

## 2013-05-06 DIAGNOSIS — H40059 Ocular hypertension, unspecified eye: Secondary | ICD-10-CM | POA: Diagnosis not present

## 2013-05-28 ENCOUNTER — Other Ambulatory Visit: Payer: Self-pay | Admitting: Family Medicine

## 2013-05-28 NOTE — Telephone Encounter (Signed)
Electronic refill request, please advise  

## 2013-05-28 NOTE — Telephone Encounter (Signed)
Please refill for 6 mo 

## 2013-06-24 ENCOUNTER — Other Ambulatory Visit: Payer: Self-pay | Admitting: *Deleted

## 2013-06-24 ENCOUNTER — Other Ambulatory Visit: Payer: Self-pay | Admitting: Family Medicine

## 2013-06-24 MED ORDER — VERAPAMIL HCL ER 240 MG PO TBCR: EXTENDED_RELEASE_TABLET | ORAL | Status: DC

## 2013-08-04 ENCOUNTER — Encounter: Payer: Self-pay | Admitting: Podiatry

## 2013-08-04 ENCOUNTER — Ambulatory Visit (INDEPENDENT_AMBULATORY_CARE_PROVIDER_SITE_OTHER): Payer: Medicare Other | Admitting: Podiatry

## 2013-08-04 DIAGNOSIS — B351 Tinea unguium: Secondary | ICD-10-CM | POA: Diagnosis not present

## 2013-08-04 DIAGNOSIS — M79609 Pain in unspecified limb: Secondary | ICD-10-CM | POA: Diagnosis not present

## 2013-08-04 DIAGNOSIS — M79676 Pain in unspecified toe(s): Secondary | ICD-10-CM

## 2013-08-04 NOTE — Progress Notes (Signed)
She presents today chief complaint of painful elongated toenails one through 5 bilateral.  Objective: Nails are thick yellow dystrophic with mycotic and painful palpation.  Assessment: Pain in limb secondary to onychomycosis 1 through 5 bilateral.  Plan: Debridement of nails 1 through 5 bilateral.

## 2013-08-12 ENCOUNTER — Other Ambulatory Visit: Payer: Self-pay | Admitting: Family Medicine

## 2013-08-29 ENCOUNTER — Ambulatory Visit: Payer: Self-pay | Admitting: Neurosurgery

## 2013-08-29 DIAGNOSIS — D320 Benign neoplasm of cerebral meninges: Secondary | ICD-10-CM | POA: Diagnosis not present

## 2013-08-31 ENCOUNTER — Other Ambulatory Visit: Payer: Self-pay | Admitting: Family Medicine

## 2013-09-01 DIAGNOSIS — Z6833 Body mass index (BMI) 33.0-33.9, adult: Secondary | ICD-10-CM | POA: Diagnosis not present

## 2013-09-01 DIAGNOSIS — I10 Essential (primary) hypertension: Secondary | ICD-10-CM | POA: Diagnosis not present

## 2013-09-01 DIAGNOSIS — D320 Benign neoplasm of cerebral meninges: Secondary | ICD-10-CM | POA: Diagnosis not present

## 2013-10-02 ENCOUNTER — Other Ambulatory Visit: Payer: Self-pay | Admitting: Family Medicine

## 2013-10-02 DIAGNOSIS — H1045 Other chronic allergic conjunctivitis: Secondary | ICD-10-CM | POA: Diagnosis not present

## 2013-10-02 DIAGNOSIS — J309 Allergic rhinitis, unspecified: Secondary | ICD-10-CM | POA: Diagnosis not present

## 2013-10-02 DIAGNOSIS — J45909 Unspecified asthma, uncomplicated: Secondary | ICD-10-CM | POA: Diagnosis not present

## 2013-10-13 ENCOUNTER — Other Ambulatory Visit: Payer: Self-pay

## 2013-10-13 DIAGNOSIS — Z1231 Encounter for screening mammogram for malignant neoplasm of breast: Secondary | ICD-10-CM

## 2013-10-30 ENCOUNTER — Ambulatory Visit
Admission: RE | Admit: 2013-10-30 | Discharge: 2013-10-30 | Disposition: A | Discharge status: Home / Self Care | Payer: Medicare Other | Source: Ambulatory Visit

## 2013-10-30 DIAGNOSIS — Z1231 Encounter for screening mammogram for malignant neoplasm of breast: Secondary | ICD-10-CM

## 2013-10-31 ENCOUNTER — Other Ambulatory Visit: Payer: Self-pay | Admitting: Family Medicine

## 2013-11-01 ENCOUNTER — Other Ambulatory Visit: Payer: Self-pay | Admitting: Family Medicine

## 2013-11-03 ENCOUNTER — Ambulatory Visit (INDEPENDENT_AMBULATORY_CARE_PROVIDER_SITE_OTHER): Payer: Medicare Other | Admitting: Podiatry

## 2013-11-03 ENCOUNTER — Encounter: Payer: Self-pay | Admitting: *Deleted

## 2013-11-03 DIAGNOSIS — B351 Tinea unguium: Secondary | ICD-10-CM

## 2013-11-03 DIAGNOSIS — M79676 Pain in unspecified toe(s): Secondary | ICD-10-CM | POA: Diagnosis not present

## 2013-11-03 NOTE — Telephone Encounter (Signed)
Please refill for 6 mo 

## 2013-11-03 NOTE — Telephone Encounter (Signed)
Electronic refill request, no recent/future appt., please advise  

## 2013-11-03 NOTE — Telephone Encounter (Signed)
done

## 2013-11-03 NOTE — Progress Notes (Signed)
Presents today chief complaint of painful elongated toenails.  Objective: Pulses are palpable bilateral nails are thick, yellow dystrophic onychomycosis and painful palpation.   Assessment: Onychomycosis with pain in limb.  Plan: Treatment of nails in thickness and length as covered service secondary to pain.  

## 2013-11-11 DIAGNOSIS — D2371 Other benign neoplasm of skin of right lower limb, including hip: Secondary | ICD-10-CM | POA: Diagnosis not present

## 2013-11-11 DIAGNOSIS — L570 Actinic keratosis: Secondary | ICD-10-CM | POA: Diagnosis not present

## 2013-11-11 DIAGNOSIS — Z85828 Personal history of other malignant neoplasm of skin: Secondary | ICD-10-CM | POA: Diagnosis not present

## 2013-11-11 DIAGNOSIS — L821 Other seborrheic keratosis: Secondary | ICD-10-CM | POA: Diagnosis not present

## 2013-11-11 DIAGNOSIS — D225 Melanocytic nevi of trunk: Secondary | ICD-10-CM | POA: Diagnosis not present

## 2013-11-21 DIAGNOSIS — Z23 Encounter for immunization: Secondary | ICD-10-CM | POA: Diagnosis not present

## 2013-12-08 ENCOUNTER — Ambulatory Visit (INDEPENDENT_AMBULATORY_CARE_PROVIDER_SITE_OTHER): Payer: Medicare Other

## 2013-12-08 ENCOUNTER — Ambulatory Visit (INDEPENDENT_AMBULATORY_CARE_PROVIDER_SITE_OTHER): Payer: Medicare Other | Admitting: Podiatry

## 2013-12-08 DIAGNOSIS — M722 Plantar fascial fibromatosis: Secondary | ICD-10-CM

## 2013-12-08 DIAGNOSIS — L600 Ingrowing nail: Secondary | ICD-10-CM | POA: Diagnosis not present

## 2013-12-08 MED ORDER — CIPROFLOXACIN-HYDROCORTISONE 0.2-1 % OT SUSP: OTIC | Status: DC

## 2013-12-08 MED ORDER — NEOMYCIN-POLYMYXIN-HC 3.5-10000-1 OT SOLN: OTIC | Status: DC

## 2013-12-08 NOTE — Progress Notes (Signed)
She presents today complaining of left arch pain with swelling to her left leg. She's also complaining of ingrown toenail third digit of the left foot lateral border.  Objective: Vital signs are stable she's alert and oriented 3 pulses are palpable left. pain. No reproducible pain about the left ankle. There she states that hurts right in here as she points to the posterior tibial tendon. She does have a history of plantar fasciitis. Third toe left foot does demonstrate erythema and edema along the fibular border which is sharply incurvated third digit left foot.  Assessment: Edema of the left leg with posterior tibial tendinitis mostly associated with plantar fasciitis left. Ingrown nail third toe left foot.  Assessment: Placed her in a Tri-Lock brace for the painful ankle. Performed a chemical matrixectomy fibular border third digit left foot. She tolerated procedure well we'll start soaking twice a day Betadine in warm water and I will follow-up with her in a little more than a week.

## 2013-12-08 NOTE — Patient Instructions (Signed)

## 2013-12-18 ENCOUNTER — Ambulatory Visit (INDEPENDENT_AMBULATORY_CARE_PROVIDER_SITE_OTHER): Payer: Medicare Other | Admitting: Podiatry

## 2013-12-18 VITALS — BP 132/71 | HR 64 | Resp 16

## 2013-12-18 DIAGNOSIS — L600 Ingrowing nail: Secondary | ICD-10-CM | POA: Diagnosis not present

## 2013-12-18 DIAGNOSIS — M79676 Pain in unspecified toe(s): Secondary | ICD-10-CM

## 2013-12-18 MED ORDER — MUPIROCIN 2 % EX OINT: 1.0000 "application " | TOPICAL_OINTMENT | Freq: Every day | CUTANEOUS | Status: DC

## 2013-12-18 NOTE — Patient Instructions (Signed)
Continue soaking in epsom salts twice a day followed by antibiotic ointment and a band-aid. Can leave uncovered at night.  Monitor for any signs/symptoms of infection. Call the office immediately if any occur or go directly to the emergency room. Call with any questions/concerns.

## 2013-12-18 NOTE — Progress Notes (Signed)
Patient ID: Amber Miles, female   DOB: 1945-05-03, 68 y.o.   MRN: [id number]  Subjective: 68 year old female returns the office they follow-up evaluation status post left third digit partial nail avulsion with chemical matricectomy. She states that she has started soaking the foot twice a day Epson salts covering with Corticosporin drops and a Band-Aid. She states the Corticosporin drops did not stay within the area. She denies any recent redness or drainage from around the nail site. There is no pain associated with the nail. She does state that since starting soaking the foot for the third toe the fifth digit nail had some bleeding around the area. She denies any redness or any swelling to the toe. Also mild incurvation on the left leg toenail without any swelling erythema or drainage. Slight discomfort on the distal aspect of the nail. No other complaints at this time. Denies any systemic complaints such as fevers, chills, nausea, vomiting.  Objective: AAO x3, NAD DP/PT pulses palpable bilaterally, CRT less than 3 seconds Protective sensation intact with Simms Weinstein monofilament, vibratory sensation intact, Achilles tendon reflex intact Procedure site on the lateral border of the left third digit is healing well for this timeframe. There is small amount of dried blood within the nail border. Is no swelling erythema, edema, ascending saline disc, purulence. No signs of infection. On the distal aspect of the fifth digit there is small amount of dried blood underneath the distal aspect the nail. 5th digit nail is hypertrophic, dystrophic and elongated. Upon debridement of the nail is small amount of skin with attached the nail causing some bleeding. Mild incurvation of both the medial and lateral nail borders on the distal aspect of the hallux. There is no swelling erythema, edema, drainage. No signs of infection to the nail sites. No pain with calf compression, swelling, warmth, erythema.    Assessment: 68 year old female follow up evaluation of left third digit partial nail avulsion with chemical matricectomy with new areas of bleeding without any discomfort of the fifth digit nail distally; ingrown toenail left hallux  Plan: -Treatment options were discussed including alternatives, risks, complications. -Left third digit nail healing well for this timeframe about any signs of infection. Fifth digit toenail was sharply debrided. Underlying the distal aspect of the nail was a small monitor bleeding. Continue to soak the foot twice a day Epson salts covering with Bactroban ointment and a Band-Aid. Today, silvadene and a band-aid was applied. Continue to monitor for any signs or symptoms of infection directed to call the office if any are to occur or go to the emergency room. -Left hallux nail sharply debrided to remove the symptomatic portion of the ingrown toenail without any complications. No signs of infection.  -Follow up in 2 weeks to recheck the 3rd and 5th digit nails or sooner if any problems arise. In the meantime, call the office with any questions, concerns, change in symptoms.

## 2013-12-27 ENCOUNTER — Telehealth: Payer: Self-pay | Admitting: Family Medicine

## 2013-12-30 NOTE — Telephone Encounter (Signed)
Electronic refill request, no recent/future appt., please advise  

## 2013-12-30 NOTE — Telephone Encounter (Signed)
Please schedule annual exam (spring or summer) and refill until then

## 2013-12-30 NOTE — Telephone Encounter (Signed)
Please see prev note. Please schedule CPE, once it's scheduled I will refill meds

## 2013-12-31 NOTE — Telephone Encounter (Signed)
Patient called back and scheduled lab and physical in May.

## 2013-12-31 NOTE — Telephone Encounter (Signed)
Med refilled.

## 2013-12-31 NOTE — Telephone Encounter (Signed)
LVM for pt to c/b and schedule cpe in order to get med refill

## 2014-01-01 ENCOUNTER — Ambulatory Visit (INDEPENDENT_AMBULATORY_CARE_PROVIDER_SITE_OTHER): Payer: Medicare Other | Admitting: Podiatry

## 2014-01-01 DIAGNOSIS — M722 Plantar fascial fibromatosis: Secondary | ICD-10-CM

## 2014-01-01 DIAGNOSIS — L600 Ingrowing nail: Secondary | ICD-10-CM

## 2014-01-01 NOTE — Patient Instructions (Signed)
Continue soaking in epsom salts and covering during the day and can leave uncovered at night.  Monitor for any signs/symptoms of infection. Call the office immediately if any occur or go directly to the emergency room. Call with any questions/concerns.

## 2014-01-01 NOTE — Progress Notes (Signed)
Patient ID: Amber Miles, female   DOB: 1945/05/10, 68 y.o.   MRN: [id number]  Subjective: 68 year old female returns the office for follow up evaluation of third digit partial nail avulsion with chemical matricectomy. She states that she's been continuing to soak the foot in Epson salts followed by interbody ointment and a Band-Aid. She is also been doing his of the fifth digit as there was some bleeding underneath this nail at the last appointment. She states of the fifth digit toenail is doing well and is healed. She denies any pain associated with the procedure site or any drainage/purulence. No surrounding erythema or streaking. The patient previously was seen for ankle pain/plantar fasciitis which she states is doing well. She is continuing to wear the ankle brace and her symptoms have decreased. She states that she ongoing without the brace at night and walk about it and she states that she has some discomfort at that time, although improved. No recent injury or trauma to the area. Denies any systemic complaints as fevers, chills, nausea, vomiting. No other complaints at this time.  Objective: AAO 3, NAD DP/PT pulses palpable bilaterally, CRT less than 3 seconds Protective sensation intact with Simms Weinstein monofilament Lateral border of the left third digit is healing well without any clinical signs of infection. There is a small scab overlying the procedure site. There is no surrounding erythema, ascending cellulitis, drainage/purulence, malodor. There is no evidence of bleeding or drainage or other signs of infection along the fifth digit nail. Remaining nails are also without discomfort or any signs of infection. There is no pain on the course of the posterior tibial tendon or along its insertion into the navicular. There is no discomfort upon palpation to the plantar medial tubercle of the calcaneus at the insertion of the plantar fascia. No pain along the course of the plantar fascia within  the arch of the foot in the ligament appears to be intact. There is no pain with lateral compression of the calcaneus or pain with vibratory sensation. No pain on the posterior aspect of the calcaneus or along the course/insertion of the Achilles tendon. There is no overlying edema, erythema, increase in warmth to the foot/ankle. No open lesions or pre-ulcerative lesions. No pain with calf compression, swelling, warmth, erythema.  Assessment: 68 year old female with left third digit partial nail avulsion with chemical matricectomy, heeling well with no signs of infection.   Plan: Treatment options were discussed the patient including all alternatives, risks, complications -At this time the procedure site appears to be healing well without any signs of infection. Discussed with the patient that she can to use antibacterial soap after she soaks with Epsom salts  to help remove some of the scab which has formed. The site is healed and there is no drainage. She can continue to cover with Band-Aid if needed. Continue to monitor for any clinical signs or symptoms of infection or any recurrence. -Continue with the brace as needed. Discussed various stretching exercises as well as ice to the area. Discussed the patient as her symptoms resolve to slowly decrease wearing the brace as tolerated. -Follow-up as needed. In the meantime, call the office with any questions, concerns, change in symptoms.

## 2014-01-06 ENCOUNTER — Other Ambulatory Visit: Payer: Self-pay | Admitting: Family Medicine

## 2014-01-14 ENCOUNTER — Other Ambulatory Visit: Payer: Self-pay

## 2014-01-14 MED ORDER — CARISOPRODOL 350 MG PO TABS: ORAL_TABLET | ORAL | Status: DC

## 2014-01-14 NOTE — Telephone Encounter (Signed)
Rx called in as prescribed 

## 2014-01-14 NOTE — Telephone Encounter (Signed)
Px written for call in   

## 2014-01-14 NOTE — Telephone Encounter (Signed)
Pt left v/m requesting refill carisoprodol to [name]. Pt has CPX scheduled 05/27/2014. Please advise.

## 2014-01-27 ENCOUNTER — Other Ambulatory Visit: Payer: Self-pay | Admitting: Family Medicine

## 2014-02-02 ENCOUNTER — Ambulatory Visit: Payer: Medicare Other

## 2014-02-02 ENCOUNTER — Ambulatory Visit: Payer: Medicare Other | Admitting: Podiatry

## 2014-02-09 ENCOUNTER — Ambulatory Visit (INDEPENDENT_AMBULATORY_CARE_PROVIDER_SITE_OTHER): Payer: Medicare Other | Admitting: Podiatry

## 2014-02-09 DIAGNOSIS — M79676 Pain in unspecified toe(s): Secondary | ICD-10-CM | POA: Diagnosis not present

## 2014-02-09 DIAGNOSIS — M47817 Spondylosis without myelopathy or radiculopathy, lumbosacral region: Secondary | ICD-10-CM | POA: Diagnosis not present

## 2014-02-09 DIAGNOSIS — M5416 Radiculopathy, lumbar region: Secondary | ICD-10-CM | POA: Diagnosis not present

## 2014-02-09 DIAGNOSIS — B351 Tinea unguium: Secondary | ICD-10-CM

## 2014-02-09 NOTE — Progress Notes (Signed)
She presents today chief complaint of painful elongated toenails one through 5 bilateral.  Objective: Nails are thick yellow dystrophic with mycotic and painful palpation.  Assessment: Pain in limb secondary to onychomycosis 1 through 5 bilateral.  Plan: Debridement of nails 1 through 5 bilateral. L 3rd nail appears to be  All healed.

## 2014-02-26 DIAGNOSIS — M5416 Radiculopathy, lumbar region: Secondary | ICD-10-CM | POA: Diagnosis not present

## 2014-02-26 DIAGNOSIS — M47817 Spondylosis without myelopathy or radiculopathy, lumbosacral region: Secondary | ICD-10-CM | POA: Diagnosis not present

## 2014-03-05 ENCOUNTER — Other Ambulatory Visit: Payer: Self-pay | Admitting: Family Medicine

## 2014-03-10 ENCOUNTER — Telehealth: Payer: Self-pay | Admitting: *Deleted

## 2014-03-10 NOTE — Telephone Encounter (Signed)
Received a prior auth request from pharmacy for estradiol patches. I obtained forms and completed the. Form is in your inbox forapproval before sending to insurance company.

## 2014-03-10 NOTE — Telephone Encounter (Signed)
Done and in IN box 

## 2014-03-11 NOTE — Telephone Encounter (Signed)
Forms faxed, and placed in Shapale's inbox to wait response.

## 2014-03-17 NOTE — Telephone Encounter (Signed)
Received fax from [facility]  Patient has been denial for estradiol on 03/12/14.

## 2014-03-30 DIAGNOSIS — M47817 Spondylosis without myelopathy or radiculopathy, lumbosacral region: Secondary | ICD-10-CM | POA: Diagnosis not present

## 2014-03-30 DIAGNOSIS — M5416 Radiculopathy, lumbar region: Secondary | ICD-10-CM | POA: Diagnosis not present

## 2014-04-09 DIAGNOSIS — D34 Benign neoplasm of thyroid gland: Secondary | ICD-10-CM | POA: Diagnosis not present

## 2014-04-09 DIAGNOSIS — J4520 Mild intermittent asthma, uncomplicated: Secondary | ICD-10-CM | POA: Diagnosis not present

## 2014-04-09 DIAGNOSIS — N958 Other specified menopausal and perimenopausal disorders: Secondary | ICD-10-CM | POA: Diagnosis not present

## 2014-04-09 DIAGNOSIS — K229 Disease of esophagus, unspecified: Secondary | ICD-10-CM | POA: Diagnosis not present

## 2014-04-09 DIAGNOSIS — E042 Nontoxic multinodular goiter: Secondary | ICD-10-CM | POA: Diagnosis not present

## 2014-04-09 DIAGNOSIS — E0520 Thyrotoxicosis with toxic multinodular goiter without thyrotoxic crisis or storm: Secondary | ICD-10-CM | POA: Diagnosis not present

## 2014-04-09 DIAGNOSIS — E890 Postprocedural hypothyroidism: Secondary | ICD-10-CM | POA: Diagnosis not present

## 2014-04-09 DIAGNOSIS — I10 Essential (primary) hypertension: Secondary | ICD-10-CM | POA: Diagnosis not present

## 2014-04-09 DIAGNOSIS — E0521 Thyrotoxicosis with toxic multinodular goiter with thyrotoxic crisis or storm: Secondary | ICD-10-CM | POA: Diagnosis not present

## 2014-04-16 DIAGNOSIS — E890 Postprocedural hypothyroidism: Secondary | ICD-10-CM | POA: Diagnosis not present

## 2014-04-16 DIAGNOSIS — I10 Essential (primary) hypertension: Secondary | ICD-10-CM | POA: Diagnosis not present

## 2014-04-16 DIAGNOSIS — N958 Other specified menopausal and perimenopausal disorders: Secondary | ICD-10-CM | POA: Diagnosis not present

## 2014-04-16 DIAGNOSIS — J4520 Mild intermittent asthma, uncomplicated: Secondary | ICD-10-CM | POA: Diagnosis not present

## 2014-04-16 DIAGNOSIS — E042 Nontoxic multinodular goiter: Secondary | ICD-10-CM | POA: Diagnosis not present

## 2014-04-16 DIAGNOSIS — K229 Disease of esophagus, unspecified: Secondary | ICD-10-CM | POA: Diagnosis not present

## 2014-04-16 DIAGNOSIS — E0521 Thyrotoxicosis with toxic multinodular goiter with thyrotoxic crisis or storm: Secondary | ICD-10-CM | POA: Diagnosis not present

## 2014-04-16 DIAGNOSIS — D34 Benign neoplasm of thyroid gland: Secondary | ICD-10-CM | POA: Diagnosis not present

## 2014-05-11 ENCOUNTER — Ambulatory Visit (INDEPENDENT_AMBULATORY_CARE_PROVIDER_SITE_OTHER): Payer: Medicare Other | Admitting: Podiatry

## 2014-05-11 DIAGNOSIS — M79676 Pain in unspecified toe(s): Secondary | ICD-10-CM | POA: Diagnosis not present

## 2014-05-11 DIAGNOSIS — B351 Tinea unguium: Secondary | ICD-10-CM

## 2014-05-11 DIAGNOSIS — H40053 Ocular hypertension, bilateral: Secondary | ICD-10-CM | POA: Diagnosis not present

## 2014-05-11 NOTE — Progress Notes (Signed)
She presents today chief complaint of painful elongated toenails one through 5 bilateral.  Objective: Nails are thick yellow dystrophic with mycotic and painful palpation.  Assessment: Pain in limb secondary to onychomycosis 1 through 5 bilateral.  Plan: Debridement of nails 1 through 5 bilateral. L 3rd nail appears to be  All healed.

## 2014-05-14 ENCOUNTER — Telehealth: Payer: Self-pay | Admitting: Family Medicine

## 2014-05-14 DIAGNOSIS — R739 Hyperglycemia, unspecified: Secondary | ICD-10-CM

## 2014-05-14 DIAGNOSIS — I10 Essential (primary) hypertension: Secondary | ICD-10-CM

## 2014-05-14 DIAGNOSIS — E785 Hyperlipidemia, unspecified: Secondary | ICD-10-CM

## 2014-05-14 DIAGNOSIS — E039 Hypothyroidism, unspecified: Secondary | ICD-10-CM

## 2014-05-14 NOTE — Telephone Encounter (Signed)
-----   Message from Ellamae Sia sent at 05/11/2014  4:26 PM EDT ----- Regarding: Lab orders for Friday, 4.29.16 Patient is scheduled for CPX labs, please order future labs, Thanks , Karna Christmas

## 2014-05-15 ENCOUNTER — Other Ambulatory Visit (INDEPENDENT_AMBULATORY_CARE_PROVIDER_SITE_OTHER): Payer: Medicare Other

## 2014-05-15 DIAGNOSIS — I10 Essential (primary) hypertension: Secondary | ICD-10-CM

## 2014-05-15 DIAGNOSIS — E785 Hyperlipidemia, unspecified: Secondary | ICD-10-CM

## 2014-05-15 DIAGNOSIS — R739 Hyperglycemia, unspecified: Secondary | ICD-10-CM | POA: Diagnosis not present

## 2014-05-15 DIAGNOSIS — E039 Hypothyroidism, unspecified: Secondary | ICD-10-CM | POA: Diagnosis not present

## 2014-05-15 LAB — COMPREHENSIVE METABOLIC PANEL
ALK PHOS: 89 U/L (ref 39–117)
ALT: 18 U/L (ref 0–35)
AST: 18 U/L (ref 0–37)
Albumin: 3.5 g/dL (ref 3.5–5.2)
BILIRUBIN TOTAL: 0.6 mg/dL (ref 0.2–1.2)
BUN: 18 mg/dL (ref 6–23)
CO2: 32 mEq/L (ref 19–32)
CREATININE: 0.92 mg/dL (ref 0.40–1.20)
Calcium: 9.6 mg/dL (ref 8.4–10.5)
Chloride: 102 mEq/L (ref 96–112)
GFR: 64.26 mL/min (ref >60.00)
GLUCOSE: 95 mg/dL (ref 70–99)
POTASSIUM: 3.7 meq/L (ref 3.5–5.1)
SODIUM: 140 meq/L (ref 135–145)
Total Protein: 6.7 g/dL (ref 6.0–8.3)

## 2014-05-15 LAB — CBC WITH DIFFERENTIAL/PLATELET
BASOS ABS: 0 10*3/uL (ref 0.0–0.1)
Basophils Relative: 0.7 % (ref 0.0–3.0)
EOS ABS: 0.2 10*3/uL (ref 0.0–0.7)
Eosinophils Relative: 2.9 % (ref 0.0–5.0)
HEMATOCRIT: 42.7 % (ref 36.0–46.0)
Hemoglobin: 14.7 g/dL (ref 12.0–15.0)
Lymphocytes Relative: 18.9 % (ref 12.0–46.0)
Lymphs Abs: 1.2 10*3/uL (ref 0.7–4.0)
MCHC: 34.5 g/dL (ref 30.0–36.0)
MCV: 85.4 fl (ref 78.0–100.0)
MONOS PCT: 10.6 % (ref 3.0–12.0)
Monocytes Absolute: 0.7 10*3/uL (ref 0.1–1.0)
NEUTROS ABS: 4.2 10*3/uL (ref 1.4–7.7)
Neutrophils Relative %: 66.9 % (ref 43.0–77.0)
Platelets: 279 10*3/uL (ref 150.0–400.0)
RBC: 4.99 Mil/uL (ref 3.87–5.11)
RDW: 15.1 % (ref 11.5–15.5)
WBC: 6.3 10*3/uL (ref 4.0–10.5)

## 2014-05-15 LAB — LIPID PANEL
CHOL/HDL RATIO: 4
Cholesterol: 170 mg/dL (ref 0–200)
HDL: 44 mg/dL (ref >39.00)
LDL Cholesterol: 103 mg/dL — ABNORMAL HIGH (ref 0–99)
NonHDL: 126
TRIGLYCERIDES: 113 mg/dL (ref 0.0–149.0)
VLDL: 22.6 mg/dL (ref 0.0–40.0)

## 2014-05-15 LAB — HEMOGLOBIN A1C: HEMOGLOBIN A1C: 5.6 % (ref 4.6–6.5)

## 2014-05-15 LAB — TSH: TSH: 4.85 u[IU]/mL — ABNORMAL HIGH (ref 0.35–4.50)

## 2014-05-19 ENCOUNTER — Other Ambulatory Visit: Payer: Medicare Other

## 2014-05-27 ENCOUNTER — Ambulatory Visit (INDEPENDENT_AMBULATORY_CARE_PROVIDER_SITE_OTHER): Payer: Medicare Other | Admitting: Family Medicine

## 2014-05-27 ENCOUNTER — Encounter: Payer: Self-pay | Admitting: Family Medicine

## 2014-05-27 VITALS — BP 136/82 | HR 58 | Temp 97.9°F | Ht 67.75 in | Wt 243.0 lb

## 2014-05-27 DIAGNOSIS — Z0000 Encounter for general adult medical examination without abnormal findings: Secondary | ICD-10-CM | POA: Diagnosis not present

## 2014-05-27 DIAGNOSIS — R600 Localized edema: Secondary | ICD-10-CM

## 2014-05-27 DIAGNOSIS — E2839 Other primary ovarian failure: Secondary | ICD-10-CM | POA: Diagnosis not present

## 2014-05-27 DIAGNOSIS — R739 Hyperglycemia, unspecified: Secondary | ICD-10-CM | POA: Diagnosis not present

## 2014-05-27 DIAGNOSIS — Z23 Encounter for immunization: Secondary | ICD-10-CM | POA: Diagnosis not present

## 2014-05-27 DIAGNOSIS — I10 Essential (primary) hypertension: Secondary | ICD-10-CM | POA: Diagnosis not present

## 2014-05-27 DIAGNOSIS — E669 Obesity, unspecified: Secondary | ICD-10-CM

## 2014-05-27 DIAGNOSIS — E039 Hypothyroidism, unspecified: Secondary | ICD-10-CM

## 2014-05-27 NOTE — Progress Notes (Signed)
Subjective:    Patient ID: Amber Miles, female    DOB: 1945-02-07, 69 y.o.   MRN: [id number]  HPI Here for annual medicare wellness visit as well as chronic/acute medical problems   I have personally reviewed the Medicare Annual Wellness questionnaire and have noted 1. The patient's medical and social history 2. Their use of alcohol, tobacco or illicit drugs 3. Their current medications and supplements 4. The patient's functional ability including ADL's, fall risks, home safety risks and hearing or visual             impairment. 5. Diet and physical activities 6. Evidence for depression or mood disorders  The patients weight, height, BMI have been recorded in the chart and visual acuity is per eye clinic.  I have made referrals, counseling and provided education to the patient based review of the above and I have provided the pt with a written personalized care plan for preventive services. Reviewed and updated provider list, see scanned forms.  Has been pretty swollen lately - all winter  Is dependent  Worse with traveling and sitting  Does not wear support hose - does not know if she could get them on  Very busy schedule   Trying to take care of herself - trying to eat right  Ate poorly on her trip however   Just went on a trip to [place]- enjoyed it    See scanned forms.  Routine anticipatory guidance given to patient.  See health maintenance. Colon cancer screening 7/12 - 5 year recall  Breast cancer screening 10/15  Self breast exam -no lumps or changes  Flu vaccine 11/15  Tetanus vaccine 11/13  Pneumovax 11/13- has not had prevnar  Zoster vaccine 9/12  dexa 3/15  Advance directive has a living will and power of attorney  Cognitive function addressed- see scanned forms- and if abnormal then additional documentation follows.  No concerns   PMH and SH reviewed  Meds, vitals, and allergies reviewed.   ROS: See HPI.  Otherwise negative.    Hypothyroid -sees Dr  Ronnald Collum Lab Results  Component Value Date   TSH 4.85* 05/15/2014   was fine at his office -no change in dose 88 mcg    bp is stable today  No cp or palpitations or headaches or edema  No side effects to medicines  BP Readings from Last 3 Encounters:  05/27/14 136/82  12/18/13 132/71  02/21/13 158/84      Hyperlipidemia Lab Results  Component Value Date   CHOL 170 05/15/2014   CHOL 190 12/09/2012   CHOL 187 11/24/2011   Lab Results  Component Value Date   HDL 44.00 05/15/2014   HDL 48.00 12/09/2012   HDL 45.50 11/24/2011   Lab Results  Component Value Date   LDLCALC 103* 05/15/2014   LDLCALC 112* 12/09/2012   LDLCALC 114* 11/24/2011   Lab Results  Component Value Date   TRIG 113.0 05/15/2014   TRIG 152.0* 12/09/2012   TRIG 139.0 11/24/2011   Lab Results  Component Value Date   CHOLHDL 4 05/15/2014   CHOLHDL 4 12/09/2012   CHOLHDL 4 11/24/2011   No results found for: LDLDIRECT  Overall stable and fairly controlled    Hyperglycemia  Lab Results  Component Value Date   HGBA1C 5.6 05/15/2014    Patient Active Problem List   Diagnosis Date Noted  . Estrogen deficiency 02/21/2013  . Encounter for Medicare annual wellness exam 02/21/2013  . Low back pain potentially  associated with radiculopathy 10/11/2012  . 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 ALLERGIC RHINITIS 01/28/2010  . Hyperglycemia 07/30/2009  . ANXIETY 11/30/2008  . Hyperlipidemia 04/13/2008  . Hypothyroidism 01/07/2008  . Obesity 01/07/2008  . HYPERTENSION, BENIGN 01/07/2008  . 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 Hypothyroid   . Hyperlipidemia   . Hypertension   . Arthritis     OA knee replacement  . GERD (gastroesophageal reflux disease)   . Allergy     allergic rhinitis  . Reactive airways dysfunction syndrome   . Anxiety   . Skin lesions, generalized     squamous cell skin lesions and basal cell skin lesions   Past Surgical History    Procedure Laterality Date  . Tsa    . Abdominal hysterectomy  1990s    hyst with oophrectomy for B9lesion/cyst and prolapse  . Recocele      and cystocele repair  . 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 Smoking status: Never Smoker   . Smokeless tobacco: Never Used  . Alcohol Use: No   Family History  Problem Relation Age of Onset  . Hypertension Mother   . Stroke Mother   . Depression Mother   . Alzheimer's disease Mother   . Heart disease Father   . Hypertension Sister    Allergies  Allergen Reactions  . Neosporin [Neomycin-Polymyxin-Gramicidin] Other (See Comments)    blisters  . Tape Swelling    Itching and redness Pt said severe reaction to surgical tape.? latex   Current Outpatient Prescriptions on File Prior to Visit  Medication Sig Dispense Refill  . carisoprodol (SOMA) 350 MG tablet TAKE 1 TABLET BY MOUTH 3 TIMES DAILY AS NEEDED 90 tablet 0  . celecoxib (CELEBREX) 200 MG capsule TAKE 1 CAPSULE (200 MG TOTAL) BY MOUTH DAILY. WITH FOOD AS NEEDED 30 capsule 3  . esomeprazole (NEXIUM) 40 MG capsule TAKE ONE CAPSULE BY MOUTH EVERY MORNING BEFORE BREAKFAST 30 capsule 3  . Glucosamine-Chondroitin 750-600 MG TABS Take 1 tablet by mouth 2 (two) times daily.      Marland Kitchen KLOR-CON M20 20 MEQ tablet TAKE 1 TABLET (20 MEQ TOTAL) BY MOUTH ONCE. 30 tablet 3  . levothyroxine (SYNTHROID, LEVOTHROID) 88 MCG tablet Take 88 mcg by mouth daily.      . montelukast (SINGULAIR) 10 MG tablet Take 10 mg by mouth daily.     . Multiple Vitamin (MULTIVITAMIN) tablet Take 1 tablet by mouth daily.      . mupirocin ointment (BACTROBAN) 2 % Apply 1 application topically daily. 22 g 0  . Olopatadine HCl (PATANASE NA) Place 2 sprays into the nose 2 (two) times daily.    Marland Kitchen PROAIR HFA 108 (90 BASE) MCG/ACT inhaler USE 2 PUFFS EVERY 4 HOURS AS NEEDED FOR WHEEZING 8.5 each 4  . QVAR 80 MCG/ACT inhaler daily.    . traMADol (ULTRAM) 50 MG tablet Take 2  tablets (100 mg total) by mouth every 8 (eight) hours as needed for pain. 90 tablet 0  . triamterene-hydrochlorothiazide (MAXZIDE-25) 37.5-25 MG per tablet TAKE 2 TABLETS BY MOUTH ONCE A DAY 60 tablet 4  . verapamil (CALAN-SR) 240 MG CR tablet TAKE 1 TABLET (240 MG TOTAL) BY MOUTH 2 (TWO) TIMES DAILY. 60 tablet 3  . VIVELLE-DOT 0.0375 MG/24HR PLACE 1 PATCH ONTO THE SKIN 2 (TWO) TIMES A WEEK. 8 patch 5   No current facility-administered medications  on file prior to visit.       Review of Systems Review of Systems  Constitutional: Negative for fever, appetite change, fatigue and unexpected weight change.  Eyes: Negative for pain and visual disturbance.  Respiratory: Negative for cough and shortness of breath.   Cardiovascular: Negative for cp or palpitations   neg for PND or orthopnea, pos for pedal edema  Gastrointestinal: Negative for nausea, diarrhea and constipation.  Genitourinary: Negative for urgency and frequency.  Skin: Negative for pallor or rash   Neurological: Negative for weakness, light-headedness, numbness and headaches.  Hematological: Negative for adenopathy. Does not bruise/bleed easily.  Psychiatric/Behavioral: Negative for dysphoric mood. The patient is not nervous/anxious.         Objective:   Physical Exam  Constitutional: She appears well-developed and well-nourished. No distress.  obese and well appearing   HENT:  Head: Normocephalic and atraumatic.  Right Ear: External ear normal.  Left Ear: External ear normal.  Mouth/Throat: Oropharynx is clear and moist.  Eyes: Conjunctivae and EOM are normal. Pupils are equal, round, and reactive to light. No scleral icterus.  Neck: Normal range of motion. Neck supple. No JVD present. Carotid bruit is not present. No thyromegaly present.  Cardiovascular: Normal rate, regular rhythm, normal heart sounds and intact distal pulses.  Exam reveals no gallop.   Pulmonary/Chest: Effort normal and breath sounds normal. No  respiratory distress. She has no wheezes. She exhibits no tenderness.  Abdominal: Soft. Bowel sounds are normal. She exhibits no distension, no abdominal bruit and no mass. There is no tenderness.  Genitourinary: No breast swelling, tenderness, discharge or bleeding.  Breast exam: No mass, nodules, thickening, tenderness, bulging, retraction, inflamation, nipple discharge or skin changes noted.  No axillary or clavicular LA.      Musculoskeletal: Normal range of motion. She exhibits edema. She exhibits no tenderness.  Trace to one plus pedal edema 1/2 way to the knee  Lymphadenopathy:    She has no cervical adenopathy.  Neurological: She is alert. She has normal reflexes. No cranial nerve deficit. She exhibits normal muscle tone. Coordination normal.  Skin: Skin is warm and dry. No rash noted. No erythema. No pallor.  Psychiatric: She has a normal mood and affect.          Assessment & Plan:   Problem List Items Addressed This Visit    Encounter for Medicare annual wellness exam    Reviewed health habits including diet and exercise and skin cancer prevention Reviewed appropriate screening tests for age  Also reviewed health mt list, fam hx and immunization status , as well as social and family history    See HPI Labs rev prevnar today         Estrogen deficiency    Continues HRT via her gyn - for vaginal atrophy       Hyperglycemia    Lab Results  Component Value Date   HGBA1C 5.6 05/15/2014   This is well controlled with diet Enc wt loss to prevent DM      HYPERTENSION, BENIGN - Primary    bp in fair control at this time  BP Readings from Last 1 Encounters:  05/27/14 136/82   No changes needed Disc lifstyle change with low sodium diet and exercise  Pt has pedal edema - wants to cut Calan SR to once daily -will watch bp  Also hold celebrex if able - can inc edema and bp   F/u 2-3 wk Lab rev  Hypothyroidism    Sees Dr Jerilynn Mages Recent nl tsh there  Lab Results    Component Value Date   TSH 4.85* 05/15/2014    On 88 mcg No clinical changes       Obesity    Discussed how this problem influences overall health and the risks it imposes  Reviewed plan for weight loss with lower calorie diet (via better food choices and also portion control or program like weight watchers) and exercise building up to or more than 30 minutes 5 days per week including some aerobic activity   ? If some of recent wt gain is from fluid retention       Pedal edema    Dependent Worse with age  Would like to avoid supp hose in summer  Will cut Calan sr to qd from bid  Also try to get off celebrex Disc sodium and water intake  Elevate feet when sitting F/u 2-3 wk and re check this and bp       Other Visit Diagnoses    Need for vaccination with 13-polyvalent pneumococcal conjugate vaccine        Relevant Orders    Pneumococcal conjugate vaccine 13-valent (Completed)

## 2014-05-27 NOTE — Assessment & Plan Note (Signed)
Dependent Worse with age  Would like to avoid supp hose in summer  Will cut Calan sr to qd from bid  Also try to get off celebrex Disc sodium and water intake  Elevate feet when sitting F/u 2-3 wk and re check this and bp

## 2014-05-27 NOTE — Assessment & Plan Note (Signed)
Lab Results  Component Value Date   HGBA1C 5.6 05/15/2014   This is well controlled with diet Enc wt loss to prevent DM

## 2014-05-27 NOTE — Progress Notes (Signed)
Pre visit review using our clinic review tool, if applicable. No additional management support is needed unless otherwise documented below in the visit note. 

## 2014-05-27 NOTE — Assessment & Plan Note (Signed)
Discussed how this problem influences overall health and the risks it imposes  Reviewed plan for weight loss with lower calorie diet (via better food choices and also portion control or program like weight watchers) and exercise building up to or more than 30 minutes 5 days per week including some aerobic activity   ? If some of recent wt gain is from fluid retention

## 2014-05-27 NOTE — Assessment & Plan Note (Signed)
Continues HRT via her gyn - for vaginal atrophy

## 2014-05-27 NOTE — Assessment & Plan Note (Signed)
Reviewed health habits including diet and exercise and skin cancer prevention Reviewed appropriate screening tests for age  Also reviewed health mt list, fam hx and immunization status , as well as social and family history    See HPI Labs rev prevnar today

## 2014-05-27 NOTE — Assessment & Plan Note (Signed)
bp in fair control at this time  BP Readings from Last 1 Encounters:  05/27/14 136/82   No changes needed Disc lifstyle change with low sodium diet and exercise  Pt has pedal edema - wants to cut Calan SR to once daily -will watch bp  Also hold celebrex if able - can inc edema and bp   F/u 2-3 wk Lab rev

## 2014-05-27 NOTE — Patient Instructions (Signed)
prevnar vaccine today  Take your Calan SR once daily instead of twice  Also try to see how you do without celebrex and take only if you really need it Avoid sodium and drink lots of water  If sitting- elevate feet  Follow up in 2-3 weeks

## 2014-05-27 NOTE — Assessment & Plan Note (Signed)
Sees Dr Jerilynn Mages Recent nl tsh there  Lab Results  Component Value Date   TSH 4.85* 05/15/2014    On 88 mcg No clinical changes

## 2014-06-17 ENCOUNTER — Encounter: Payer: Self-pay | Admitting: Family Medicine

## 2014-06-17 ENCOUNTER — Ambulatory Visit (INDEPENDENT_AMBULATORY_CARE_PROVIDER_SITE_OTHER): Payer: Medicare Other | Admitting: Family Medicine

## 2014-06-17 VITALS — BP 138/70 | HR 63 | Temp 98.2°F | Ht 67.75 in | Wt 237.8 lb

## 2014-06-17 DIAGNOSIS — I10 Essential (primary) hypertension: Secondary | ICD-10-CM

## 2014-06-17 DIAGNOSIS — R600 Localized edema: Secondary | ICD-10-CM

## 2014-06-17 DIAGNOSIS — E669 Obesity, unspecified: Secondary | ICD-10-CM | POA: Diagnosis not present

## 2014-06-17 NOTE — Progress Notes (Signed)
Pre visit review using our clinic review tool, if applicable. No additional management support is needed unless otherwise documented below in the visit note. 

## 2014-06-17 NOTE — Assessment & Plan Note (Signed)
Improved after cutting calan SR and lower sodium diet  Also stopping celebrex  Disc imp of good water intake / DASH diet and leg elevation F/u 6 mo  Update if symptoms worsen

## 2014-06-17 NOTE — Progress Notes (Signed)
Subjective:    Patient ID: Amber Miles, female    DOB: [birth year], 69 y.o.   MRN: [id number]  HPI Here for f/u of HTN and edema  She cut out celebrex  Cut dose of Calan SR  Swelling is not nearly as bad and wt is down 6 lb  bmi of 36   Is watching processed foods - most of the time Drinking water   BP Readings from Last 3 Encounters:  06/17/14 140/76  05/27/14 136/82  12/18/13 132/71     Checked at home only initially - 130s / 70s for the most part   Patient Active Problem List   Diagnosis Date Noted  . Pedal edema 05/27/2014  . Estrogen deficiency 02/21/2013  . Encounter for Medicare annual wellness exam 02/21/2013  . Low back pain potentially associated with radiculopathy 10/11/2012  . 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 ALLERGIC RHINITIS 01/28/2010  . Hyperglycemia 07/30/2009  . ANXIETY 11/30/2008  . Hyperlipidemia 04/13/2008  . Hypothyroidism 01/07/2008  . Obesity 01/07/2008  . HYPERTENSION, BENIGN 01/07/2008  . 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 Hypothyroid   . Hyperlipidemia   . Hypertension   . Arthritis     OA knee replacement  . GERD (gastroesophageal reflux disease)   . Allergy     allergic rhinitis  . Reactive airways dysfunction syndrome   . Anxiety   . Skin lesions, generalized     squamous cell skin lesions and basal cell skin lesions   Past Surgical History  Procedure Laterality Date  . Tsa    . Abdominal hysterectomy  1990s    hyst with oophrectomy for B9lesion/cyst and prolapse  . Recocele      and cystocele repair  . 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 Smoking status: Never Smoker   . Smokeless tobacco: Never Used  . Alcohol Use: No   Family History  Problem Relation Age of Onset  . Hypertension Mother   . Stroke Mother   . Depression Mother   . Alzheimer's disease Mother   . Heart disease Father   . Hypertension  Sister    Allergies  Allergen Reactions  . Neosporin [Neomycin-Polymyxin-Gramicidin] Other (See Comments)    blisters  . Tape Swelling    Itching and redness Pt said severe reaction to surgical tape.? latex   Current Outpatient Prescriptions on File Prior to Visit  Medication Sig Dispense Refill  . acyclovir (ZOVIRAX) 800 MG tablet TAKE 1 TABLET BY MOUTH 3 TIMES DAILY FOR 3 DAYS FLARES  2  . carisoprodol (SOMA) 350 MG tablet TAKE 1 TABLET BY MOUTH 3 TIMES DAILY AS NEEDED 90 tablet 0  . esomeprazole (NEXIUM) 40 MG capsule TAKE ONE CAPSULE BY MOUTH EVERY MORNING BEFORE BREAKFAST 30 capsule 3  . Glucosamine-Chondroitin 750-600 MG TABS Take 1 tablet by mouth 2 (two) times daily.      Marland Kitchen KLOR-CON M20 20 MEQ tablet TAKE 1 TABLET (20 MEQ TOTAL) BY MOUTH ONCE. 30 tablet 3  . levothyroxine (SYNTHROID, LEVOTHROID) 88 MCG tablet Take 88 mcg by mouth daily.      . montelukast (SINGULAIR) 10 MG tablet Take 10 mg by mouth daily.     . Multiple Vitamin (MULTIVITAMIN) tablet Take 1 tablet by mouth daily.      . mupirocin ointment (BACTROBAN) 2 % Apply 1 application topically daily. 22 g 0  . Olopatadine HCl (  PATANASE NA) Place 2 sprays into the nose 2 (two) times daily.    Marland Kitchen PROAIR HFA 108 (90 BASE) MCG/ACT inhaler USE 2 PUFFS EVERY 4 HOURS AS NEEDED FOR WHEEZING 8.5 each 4  . QVAR 80 MCG/ACT inhaler daily.    . traMADol (ULTRAM) 50 MG tablet Take 2 tablets (100 mg total) by mouth every 8 (eight) hours as needed for pain. 90 tablet 0  . triamterene-hydrochlorothiazide (MAXZIDE-25) 37.5-25 MG per tablet TAKE 2 TABLETS BY MOUTH ONCE A DAY 60 tablet 4  . verapamil (CALAN-SR) 240 MG CR tablet TAKE 1 TABLET (240 MG TOTAL) BY MOUTH 2 (TWO) TIMES DAILY. (Patient taking differently: TAKE 1 TABLET (240 MG TOTAL) BY MOUTH ONCE DAILY) 60 tablet 3  . VIVELLE-DOT 0.0375 MG/24HR PLACE 1 PATCH ONTO THE SKIN 2 (TWO) TIMES A WEEK. 8 patch 5  . celecoxib (CELEBREX) 200 MG capsule TAKE 1 CAPSULE (200 MG TOTAL) BY MOUTH  DAILY. WITH FOOD AS NEEDED (Patient not taking: Reported on 06/17/2014) 30 capsule 3   No current facility-administered medications on file prior to visit.      Review of Systems Review of Systems  Constitutional: Negative for fever, appetite change, fatigue and unexpected weight change.  Eyes: Negative for pain and visual disturbance.  Respiratory: Negative for cough and shortness of breath.   Cardiovascular: Negative for cp or palpitations   pos for pedal edema that is improved  Gastrointestinal: Negative for nausea, diarrhea and constipation.  Genitourinary: Negative for urgency and frequency.  Skin: Negative for pallor or rash   Neurological: Negative for weakness, light-headedness, numbness and headaches.  Hematological: Negative for adenopathy. Does not bruise/bleed easily.  Psychiatric/Behavioral: Negative for dysphoric mood. The patient is not nervous/anxious.         Objective:   Physical Exam  Constitutional: She appears well-developed and well-nourished. No distress.  obese and well appearing   HENT:  Head: Normocephalic and atraumatic.  Mouth/Throat: Oropharynx is clear and moist.  Eyes: Conjunctivae and EOM are normal. Pupils are equal, round, and reactive to light.  Neck: Normal range of motion. Neck supple. No JVD present. Carotid bruit is not present. No thyromegaly present.  Cardiovascular: Normal rate, regular rhythm, normal heart sounds and intact distal pulses.  Exam reveals no gallop.   Pulmonary/Chest: Effort normal and breath sounds normal. No respiratory distress. She has no wheezes. She has no rales.  No crackles  Abdominal: Soft. Bowel sounds are normal. She exhibits no distension, no abdominal bruit and no mass. There is no tenderness.  Musculoskeletal: She exhibits edema.  Improved pedal edema - trace pitting with some baseline lymphedema   Lymphadenopathy:    She has no cervical adenopathy.  Neurological: She is alert. She has normal reflexes.  Skin:  Skin is warm and dry. No rash noted.  Psychiatric: She has a normal mood and affect.          Assessment & Plan:   Problem List Items Addressed This Visit    HYPERTENSION, BENIGN - Primary    This is fairly stable with less Calan SR and no celebrex Will continue to watch Disc need for wt loss/ cutting portions/ exercise Also low sodium diet/DASH  Inc water intake  Doing well so far- 6 lb off  F/u 73mo       Obesity    Discussed how this problem influences overall health and the risks it imposes  Reviewed plan for weight loss with lower calorie diet (via better food choices and also portion  control or program like weight watchers) and exercise building up to or more than 30 minutes 5 days per week including some aerobic activity   6 lb down - commended on effort so far      Pedal edema    Improved after cutting calan SR and lower sodium diet  Also stopping celebrex  Disc imp of good water intake / DASH diet and leg elevation F/u 6 mo  Update if symptoms worsen

## 2014-06-17 NOTE — Patient Instructions (Signed)
See the chiropractor as planned  Blood pressure is ok - do not want it any higher  Keep working on low sodium diet and drink lots of water Elevate feet when able

## 2014-06-17 NOTE — Assessment & Plan Note (Signed)
This is fairly stable with less Calan SR and no celebrex Will continue to watch Disc need for wt loss/ cutting portions/ exercise Also low sodium diet/DASH  Inc water intake  Doing well so far- 6 lb off  F/u 88mo

## 2014-06-17 NOTE — Assessment & Plan Note (Signed)
Discussed how this problem influences overall health and the risks it imposes  Reviewed plan for weight loss with lower calorie diet (via better food choices and also portion control or program like weight watchers) and exercise building up to or more than 30 minutes 5 days per week including some aerobic activity   6 lb down - commended on effort so far

## 2014-06-18 DIAGNOSIS — M546 Pain in thoracic spine: Secondary | ICD-10-CM | POA: Diagnosis not present

## 2014-06-18 DIAGNOSIS — M9902 Segmental and somatic dysfunction of thoracic region: Secondary | ICD-10-CM | POA: Diagnosis not present

## 2014-06-19 ENCOUNTER — Other Ambulatory Visit: Payer: Self-pay | Admitting: Family Medicine

## 2014-06-29 ENCOUNTER — Other Ambulatory Visit: Payer: Self-pay | Admitting: Family Medicine

## 2014-06-29 NOTE — Telephone Encounter (Signed)
Refill sent to pharmacy as instructed. 

## 2014-06-29 NOTE — Telephone Encounter (Signed)
Received refill request from pharmacy See drug warning between Klor-con and Triamterene/ HCTZ. Is it okay to refill medication?

## 2014-06-29 NOTE — Telephone Encounter (Signed)
Please refill for a year  

## 2014-07-26 ENCOUNTER — Other Ambulatory Visit: Payer: Self-pay | Admitting: Family Medicine

## 2014-07-27 MED ORDER — VERAPAMIL HCL ER 240 MG PO TBCR: EXTENDED_RELEASE_TABLET | ORAL | Status: DC

## 2014-07-27 NOTE — Addendum Note (Signed)
Addended by: Tammi Sou on: 07/27/2014 04:00 PM   Modules accepted: Orders

## 2014-07-29 ENCOUNTER — Telehealth: Payer: Self-pay

## 2014-07-29 NOTE — Telephone Encounter (Signed)
Left voicemail requesting pt to call office back 

## 2014-07-29 NOTE — Telephone Encounter (Signed)
Pt said her swelling hasn't been to bad and she is going to start taking it BID again. Pt said if her swelling gets to bad she will call us back and let us know but she is going to go back to taking it twice a day

## 2014-07-29 NOTE — Telephone Encounter (Signed)
That sounds fine - please change back to bid on med list  Keep me posted

## 2014-07-29 NOTE — Telephone Encounter (Signed)
Pt left v/m requesting cb [phone].

## 2014-07-29 NOTE — Telephone Encounter (Signed)
This may bring back her swelling -is the swelling something she can tolerate?

## 2014-07-29 NOTE — Telephone Encounter (Signed)
Pt left v/m; Calan SR 240 mg dose was decreased from taking 1 tab bid to taking 1 tab daily; pt said BP has gone up; averaging 148-161/99-103. Pt wants to know if should go back to taking Calan SR 240 mg one tab bid. Pt request cb.

## 2014-07-30 NOTE — Telephone Encounter (Signed)
Med list updated

## 2014-08-24 ENCOUNTER — Ambulatory Visit (INDEPENDENT_AMBULATORY_CARE_PROVIDER_SITE_OTHER): Payer: Medicare Other | Admitting: Podiatry

## 2014-08-24 DIAGNOSIS — B351 Tinea unguium: Secondary | ICD-10-CM

## 2014-08-24 DIAGNOSIS — M79676 Pain in unspecified toe(s): Secondary | ICD-10-CM

## 2014-08-24 NOTE — Progress Notes (Signed)
She presents today chief complaint of painful elongated toenails one through 5 bilateral.  Objective: Nails are thick yellow dystrophic with mycotic and painful palpation. Ganglion cyst dorsal aspect right foot without complications or pain.  Assessment: Pain in limb secondary to onychomycosis 1 through 5 bilateral. Ganglion cyst right foot.  Plan: Debridement of nails 1 through 5 bilateral.  3 mo. discussed the possible need for surgical excision daily insisted necessary.

## 2014-10-05 ENCOUNTER — Other Ambulatory Visit: Payer: Self-pay

## 2014-10-05 DIAGNOSIS — Z1231 Encounter for screening mammogram for malignant neoplasm of breast: Secondary | ICD-10-CM

## 2014-10-08 DIAGNOSIS — M546 Pain in thoracic spine: Secondary | ICD-10-CM | POA: Diagnosis not present

## 2014-10-08 DIAGNOSIS — M545 Low back pain: Secondary | ICD-10-CM | POA: Diagnosis not present

## 2014-10-08 DIAGNOSIS — J4530 Mild persistent asthma, uncomplicated: Secondary | ICD-10-CM | POA: Diagnosis not present

## 2014-10-08 DIAGNOSIS — H1045 Other chronic allergic conjunctivitis: Secondary | ICD-10-CM | POA: Diagnosis not present

## 2014-10-08 DIAGNOSIS — M9902 Segmental and somatic dysfunction of thoracic region: Secondary | ICD-10-CM | POA: Diagnosis not present

## 2014-10-08 DIAGNOSIS — M9903 Segmental and somatic dysfunction of lumbar region: Secondary | ICD-10-CM | POA: Diagnosis not present

## 2014-10-08 DIAGNOSIS — J300 Vasomotor rhinitis: Secondary | ICD-10-CM | POA: Diagnosis not present

## 2014-10-12 DIAGNOSIS — M9902 Segmental and somatic dysfunction of thoracic region: Secondary | ICD-10-CM | POA: Diagnosis not present

## 2014-10-12 DIAGNOSIS — M545 Low back pain: Secondary | ICD-10-CM | POA: Diagnosis not present

## 2014-10-12 DIAGNOSIS — M9903 Segmental and somatic dysfunction of lumbar region: Secondary | ICD-10-CM | POA: Diagnosis not present

## 2014-10-12 DIAGNOSIS — M546 Pain in thoracic spine: Secondary | ICD-10-CM | POA: Diagnosis not present

## 2014-10-28 ENCOUNTER — Ambulatory Visit: Payer: Medicare Other

## 2014-10-28 ENCOUNTER — Encounter: Payer: Self-pay | Admitting: Podiatry

## 2014-10-28 ENCOUNTER — Ambulatory Visit (INDEPENDENT_AMBULATORY_CARE_PROVIDER_SITE_OTHER): Payer: Medicare Other | Admitting: Podiatry

## 2014-10-28 DIAGNOSIS — M79676 Pain in unspecified toe(s): Secondary | ICD-10-CM

## 2014-10-28 DIAGNOSIS — Z23 Encounter for immunization: Secondary | ICD-10-CM | POA: Diagnosis not present

## 2014-10-28 DIAGNOSIS — B351 Tinea unguium: Secondary | ICD-10-CM | POA: Diagnosis not present

## 2014-10-29 NOTE — Progress Notes (Signed)
She presents today chief complaint of painful elongated toenails she states is now approximately 2 months.  Objective: Vital signs are stable she is alert and oriented 3. Toenails are thick yellow dystrophic likely mycotic and painful on palpation. Pulses remain palpable. No open lesions or wounds. Neurologic sensorium is intact.  Assessment: Pain in limb secondary onychomycosis.  Plan: Debridement of nails 1 through 5 bilateral covered service secondary to pain and onychomycosis.  Roselind Messier DPM

## 2014-11-05 ENCOUNTER — Ambulatory Visit
Admission: RE | Admit: 2014-11-05 | Discharge: 2014-11-05 | Disposition: A | Discharge status: Home / Self Care | Payer: Medicare Other | Source: Ambulatory Visit

## 2014-11-05 DIAGNOSIS — Z1231 Encounter for screening mammogram for malignant neoplasm of breast: Secondary | ICD-10-CM

## 2014-11-05 LAB — HM MAMMOGRAPHY: HM Mammogram: NORMAL

## 2014-11-09 ENCOUNTER — Other Ambulatory Visit: Payer: Self-pay | Admitting: Family Medicine

## 2014-11-09 DIAGNOSIS — R928 Other abnormal and inconclusive findings on diagnostic imaging of breast: Secondary | ICD-10-CM

## 2014-11-10 ENCOUNTER — Ambulatory Visit
Admission: RE | Admit: 2014-11-10 | Discharge: 2014-11-10 | Disposition: A | Discharge status: Home / Self Care | Payer: Medicare Other | Source: Ambulatory Visit | Attending: Family Medicine | Admitting: Family Medicine

## 2014-11-10 DIAGNOSIS — R928 Other abnormal and inconclusive findings on diagnostic imaging of breast: Secondary | ICD-10-CM

## 2014-11-10 LAB — HM MAMMOGRAPHY: HM Mammogram: NORMAL

## 2014-11-11 ENCOUNTER — Encounter: Payer: Self-pay | Admitting: Family Medicine

## 2014-11-16 DIAGNOSIS — L820 Inflamed seborrheic keratosis: Secondary | ICD-10-CM | POA: Diagnosis not present

## 2014-11-16 DIAGNOSIS — Z85828 Personal history of other malignant neoplasm of skin: Secondary | ICD-10-CM | POA: Diagnosis not present

## 2014-11-16 DIAGNOSIS — L565 Disseminated superficial actinic porokeratosis (DSAP): Secondary | ICD-10-CM | POA: Diagnosis not present

## 2014-11-16 DIAGNOSIS — L821 Other seborrheic keratosis: Secondary | ICD-10-CM | POA: Diagnosis not present

## 2014-11-16 DIAGNOSIS — L570 Actinic keratosis: Secondary | ICD-10-CM | POA: Diagnosis not present

## 2014-11-16 DIAGNOSIS — D225 Melanocytic nevi of trunk: Secondary | ICD-10-CM | POA: Diagnosis not present

## 2014-11-16 DIAGNOSIS — D692 Other nonthrombocytopenic purpura: Secondary | ICD-10-CM | POA: Diagnosis not present

## 2014-11-16 DIAGNOSIS — D2372 Other benign neoplasm of skin of left lower limb, including hip: Secondary | ICD-10-CM | POA: Diagnosis not present

## 2014-11-21 ENCOUNTER — Other Ambulatory Visit: Payer: Self-pay | Admitting: Family Medicine

## 2014-12-14 ENCOUNTER — Other Ambulatory Visit: Payer: Self-pay | Admitting: Family Medicine

## 2014-12-14 NOTE — Telephone Encounter (Signed)
It is ok  Please refill for a year  

## 2014-12-14 NOTE — Telephone Encounter (Signed)
Received refill request electronically See drug warning with Klor-con Is it okay to refill?

## 2014-12-14 NOTE — Telephone Encounter (Signed)
done

## 2014-12-18 ENCOUNTER — Ambulatory Visit (INDEPENDENT_AMBULATORY_CARE_PROVIDER_SITE_OTHER): Payer: Medicare Other | Admitting: Family Medicine

## 2014-12-18 ENCOUNTER — Encounter: Payer: Self-pay | Admitting: Family Medicine

## 2014-12-18 VITALS — BP 136/84 | HR 70 | Temp 98.5°F | Ht 67.75 in | Wt 236.8 lb

## 2014-12-18 DIAGNOSIS — E669 Obesity, unspecified: Secondary | ICD-10-CM

## 2014-12-18 DIAGNOSIS — I10 Essential (primary) hypertension: Secondary | ICD-10-CM

## 2014-12-18 DIAGNOSIS — R600 Localized edema: Secondary | ICD-10-CM | POA: Diagnosis not present

## 2014-12-18 NOTE — Progress Notes (Signed)
Pre visit review using our clinic review tool, if applicable. No additional management support is needed unless otherwise documented below in the visit note. 

## 2014-12-18 NOTE — Patient Instructions (Signed)
Take care of yourself  Keep working on weight loss = keep eating lots of greens and veggies  Stay active also - try to get regular exercise   Follow up in 6 months for annual exam with lab prior

## 2014-12-18 NOTE — Progress Notes (Signed)
Subjective:    Patient ID: Amber Miles, female    DOB: Mar 07, 1945, 69 y.o.   MRN: [id number]  HPI Here for f/u of chronic health problems  bp is stable today  No cp or palpitations or headaches or edema  No side effects to medicines  BP Readings from Last 3 Encounters:  12/18/14 136/84  06/17/14 138/70  05/27/14 136/82     Had cut dose of  calan SR for edema in the past  On triam-hctz as well as K  Swelling is under control   Wt is down another 1 lb (even in winter clothes) bmi of 36- obese   Has been cooking and freezing greens from daughter's garden  Shoulders really bothered her after that   Needs refill of tramadol - does not take often (last refill was a year ago)   Trying to eat healthy  More fruit and vegetables  Lots of greens Amber Miles  Amber Miles is full now   Patient Active Problem List   Diagnosis Date Noted  . Pedal edema 05/27/2014  . Estrogen deficiency 02/21/2013  . Encounter for Medicare annual wellness exam 02/21/2013  . Low back pain potentially associated with radiculopathy 10/11/2012  . 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 ALLERGIC RHINITIS 01/28/2010  . Hyperglycemia 07/30/2009  . ANXIETY 11/30/2008  . Hyperlipidemia 04/13/2008  . Hypothyroidism 01/07/2008  . Obesity 01/07/2008  . HYPERTENSION, BENIGN 01/07/2008  . 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 Hypothyroid   . Hyperlipidemia   . Hypertension   . Arthritis     OA knee replacement  . GERD (gastroesophageal reflux disease)   . Allergy     allergic rhinitis  . Reactive airways dysfunction syndrome   . Anxiety   . Skin lesions, generalized     squamous cell skin lesions and basal cell skin lesions   Past Surgical History  Procedure Laterality Date  . Tsa    . Abdominal hysterectomy  1990s    hyst with oophrectomy for B9lesion/cyst and prolapse  . Recocele      and cystocele repair  . Joint replacement  05/2009    left total knee  replacement   Social History  Substance Use Topics  . Smoking status: Never Smoker   . Smokeless tobacco: Never Used  . Alcohol Use: No   Family History  Problem Relation Age of Onset  . Hypertension Mother   . Stroke Mother   . Depression Mother   . Alzheimer's disease Mother   . Heart disease Father   . Hypertension Sister    Allergies  Allergen Reactions  . Neosporin [Neomycin-Polymyxin-Gramicidin] Other (See Comments)    blisters  . Tape Swelling    Itching and redness Pt said severe reaction to surgical tape.? latex   Current Outpatient Prescriptions on File Prior to Visit  Medication Sig Dispense Refill  . acyclovir (ZOVIRAX) 800 MG tablet TAKE 1 TABLET BY MOUTH 3 TIMES DAILY FOR 3 DAYS FLARES  2  . carisoprodol (SOMA) 350 MG tablet TAKE 1 TABLET BY MOUTH 3 TIMES DAILY AS NEEDED 90 tablet 0  . esomeprazole (NEXIUM) 40 MG capsule TAKE ONE CAPSULE BY MOUTH EVERY MORNING BEFORE BREAKFAST 30 capsule 5  . estradiol (VIVELLE-DOT) 0.0375 MG/24HR PLACE 1 PATCH ONTO THE SKIN 2 (TWO) TIMES A WEEK. 8 patch 0  . Glucosamine-Chondroitin 750-600 MG TABS Take 1 tablet by mouth 2 (two) times daily.      Marland Kitchen KLOR-CON M20 20 MEQ  tablet TAKE 1 TABLET (20 MEQ TOTAL) BY MOUTH ONCE. 30 tablet 11  . levothyroxine (SYNTHROID, LEVOTHROID) 88 MCG tablet Take 88 mcg by mouth daily.      . montelukast (SINGULAIR) 10 MG tablet Take 10 mg by mouth daily.     . Multiple Vitamin (MULTIVITAMIN) tablet Take 1 tablet by mouth daily.      . mupirocin ointment (BACTROBAN) 2 % Apply 1 application topically daily. (Patient taking differently: Place 1 application into both nostrils daily. ) 22 g 0  . Olopatadine HCl (PATANASE NA) Place 2 sprays into the nose 2 (two) times daily.    Marland Kitchen PROAIR HFA 108 (90 BASE) MCG/ACT inhaler USE 2 PUFFS EVERY 4 HOURS AS NEEDED FOR WHEEZING 8.5 each 4  . QVAR 80 MCG/ACT inhaler Inhale 2 puffs into the lungs 2 (two) times daily.     . traMADol (ULTRAM) 50 MG tablet Take 2 tablets  (100 mg total) by mouth every 8 (eight) hours as needed for pain. 90 tablet 0  . triamterene-hydrochlorothiazide (MAXZIDE-25) 37.5-25 MG tablet TAKE 2 TABLETS BY MOUTH ONCE A DAY 60 tablet 11  . verapamil (CALAN-SR) 240 MG CR tablet TAKE 1 TABLET (240 MG TOTAL) BY MOUTH 2 (TWO) TIMES DAILY. 60 tablet 4   No current facility-administered medications on file prior to visit.     Review of Systems    Review of Systems  Constitutional: Negative for fever, appetite change, fatigue and unexpected weight change.  Eyes: Negative for pain and visual disturbance.  Respiratory: Negative for cough and shortness of breath.   Cardiovascular: Negative for cp or palpitations    Gastrointestinal: Negative for nausea, diarrhea and constipation.  Genitourinary: Negative for urgency and frequency.  Skin: Negative for pallor or rash   Neurological: Negative for weakness, light-headedness, numbness and headaches.  Hematological: Negative for adenopathy. Does not bruise/bleed easily.  Psychiatric/Behavioral: Negative for dysphoric mood. The patient is not nervous/anxious.      Objective:   Physical Exam  Constitutional: She appears well-developed and well-nourished. No distress.  obese and well appearing   HENT:  Head: Normocephalic and atraumatic.  Mouth/Throat: Oropharynx is clear and moist.  Eyes: Conjunctivae and EOM are normal. Pupils are equal, round, and reactive to light.  Neck: Normal range of motion. Neck supple. No JVD present. Carotid bruit is not present. No thyromegaly present.  Cardiovascular: Normal rate, regular rhythm, normal heart sounds and intact distal pulses.  Exam reveals no gallop.   Pulmonary/Chest: Effort normal and breath sounds normal. No respiratory distress. She has no wheezes. She has no rales.  No crackles  Abdominal: Soft. Bowel sounds are normal. She exhibits no distension, no abdominal bruit and no mass. There is no tenderness.  Musculoskeletal: She exhibits no edema or  tenderness.  Lymphadenopathy:    She has no cervical adenopathy.  Neurological: She is alert. She has normal reflexes.  Skin: Skin is warm and dry. No rash noted.  Psychiatric: She has a normal mood and affect.          Assessment & Plan:   Problem List Items Addressed This Visit      Cardiovascular and Mediastinum   HYPERTENSION, BENIGN - Primary    Still stable with reduced Calan dose -continues diuretic  No more troublesome edema BP: 136/84 mmHg   Enc wt loss , DASH diet and exercise         Other   Obesity    Discussed how this problem influences overall health and the  risks it imposes  Reviewed plan for weight loss with lower calorie diet (via better food choices and also portion control or program like weight watchers) and exercise building up to or more than 30 minutes 5 days per week including some aerobic activity         Pedal edema    Improved with less Calan and also diuretic

## 2014-12-20 ENCOUNTER — Encounter: Payer: Self-pay | Admitting: Family Medicine

## 2014-12-20 NOTE — Assessment & Plan Note (Signed)
Improved with less Calan and also diuretic

## 2014-12-20 NOTE — Assessment & Plan Note (Signed)
Discussed how this problem influences overall health and the risks it imposes  Reviewed plan for weight loss with lower calorie diet (via better food choices and also portion control or program like weight watchers) and exercise building up to or more than 30 minutes 5 days per week including some aerobic activity    

## 2014-12-20 NOTE — Assessment & Plan Note (Signed)
Still stable with reduced Calan dose -continues diuretic  No more troublesome edema BP: 136/84 mmHg   Enc wt loss , DASH diet and exercise

## 2014-12-23 ENCOUNTER — Other Ambulatory Visit: Payer: Self-pay | Admitting: *Deleted

## 2014-12-23 MED ORDER — ESOMEPRAZOLE MAGNESIUM 40 MG PO CPDR: DELAYED_RELEASE_CAPSULE | ORAL | Status: DC

## 2014-12-23 MED ORDER — POTASSIUM CHLORIDE CRYS ER 20 MEQ PO TBCR: EXTENDED_RELEASE_TABLET | ORAL | Status: DC

## 2014-12-23 MED ORDER — VERAPAMIL HCL ER 240 MG PO TBCR: EXTENDED_RELEASE_TABLET | ORAL | Status: DC

## 2014-12-30 ENCOUNTER — Encounter: Payer: Self-pay | Admitting: Podiatry

## 2014-12-30 ENCOUNTER — Ambulatory Visit (INDEPENDENT_AMBULATORY_CARE_PROVIDER_SITE_OTHER): Payer: Medicare Other | Admitting: Podiatry

## 2014-12-30 DIAGNOSIS — B351 Tinea unguium: Secondary | ICD-10-CM

## 2014-12-30 DIAGNOSIS — M79676 Pain in unspecified toe(s): Secondary | ICD-10-CM

## 2014-12-30 NOTE — Progress Notes (Signed)
She presents today with a chief complaint of painful elongated toenails.  Objective: Vital signs are stable she is alert and oriented 3. Pulses are strongly palpable. All hammertoe deformities bilateral. Her toenails are thick yellow dystrophic onychomycotic sharp incurvated nail margins.  Assessment: Pain in limb secondary to onychomycosis 1 through 5 bilateral.  Plan: Discussed etiology pathology conservative versus surgical therapies. Debrided her nails 1 through 5 bilateral is a covered service secondary to pain.

## 2015-03-10 ENCOUNTER — Ambulatory Visit (INDEPENDENT_AMBULATORY_CARE_PROVIDER_SITE_OTHER): Payer: Medicare Other | Admitting: Podiatry

## 2015-03-10 ENCOUNTER — Encounter: Payer: Self-pay | Admitting: Podiatry

## 2015-03-10 DIAGNOSIS — B351 Tinea unguium: Secondary | ICD-10-CM | POA: Diagnosis not present

## 2015-03-10 DIAGNOSIS — M79676 Pain in unspecified toe(s): Secondary | ICD-10-CM | POA: Diagnosis not present

## 2015-03-10 NOTE — Progress Notes (Signed)
She presents today for follow-up of painful elongated toenails.  Objective: Well signs are stable she is alert and oriented 3. Pulses are palpable. Sharp rated now margins with the [facility] dystrophic onychomycotic nails.  Assessment: Diabetes mellitus with diabetic peripheral neuropathy and pain in limb secondary to onychomycosis.  Plan: Debridement of toenails bilateral.

## 2015-03-12 ENCOUNTER — Other Ambulatory Visit: Payer: Self-pay | Admitting: *Deleted

## 2015-03-12 MED ORDER — TRIAMTERENE-HCTZ 37.5-25 MG PO TABS: 2.0000 | ORAL_TABLET | Freq: Every day | ORAL | Status: DC

## 2015-03-12 NOTE — Telephone Encounter (Signed)
Received fax requesting Rx be changed to a 90 day supply, done 

## 2015-03-17 ENCOUNTER — Telehealth: Payer: Self-pay | Admitting: *Deleted

## 2015-03-17 NOTE — Telephone Encounter (Signed)
Received prior Auth form on pt's estradiol, please complete 2nd page, in Dr. Marliss Coots inbox

## 2015-03-19 NOTE — Telephone Encounter (Signed)
This med is for vaginal atrophy causing urinary symptoms- it was originally px from her gyn and then she asked me to take it over - so I do not know more about what alternate therapies she has tried before  She may be able to tell you or perhaps this will have to go back to her gyn?   Please check in with her  It seems to be the  form/brand they have a problem with    I will put this back in the IN box

## 2015-03-23 NOTE — Telephone Encounter (Signed)
PA faxed

## 2015-03-23 NOTE — Telephone Encounter (Signed)
Done and in IN box  Thanks  

## 2015-03-23 NOTE — Telephone Encounter (Signed)
Pt said that she was on the pill and her gyn advise her it was "to strong" and that the patch she could get in a lower dose, pt said her gyn told her she would need this her whole life time, pt said he advise her that her uterine wall was thin and she needs to be on the Rx for that as well as atrophy to help with her urinary sxs. Pt not sure what other meds she has tried besides the higher dose of estrodiol in tablet forms  PA form placed back in your inbox if you wanted to try to do PA you would need to complete the last page and sign it. Pt is requesting that we try to do the PA

## 2015-03-26 NOTE — Telephone Encounter (Signed)
PA was approved, left voicemail letting pharmacy and pt know, letter placed in Dr. Marliss Coots inbox for signing

## 2015-04-01 DIAGNOSIS — K229 Disease of esophagus, unspecified: Secondary | ICD-10-CM | POA: Diagnosis not present

## 2015-04-01 DIAGNOSIS — D34 Benign neoplasm of thyroid gland: Secondary | ICD-10-CM | POA: Diagnosis not present

## 2015-04-01 DIAGNOSIS — N958 Other specified menopausal and perimenopausal disorders: Secondary | ICD-10-CM | POA: Diagnosis not present

## 2015-04-01 DIAGNOSIS — I10 Essential (primary) hypertension: Secondary | ICD-10-CM | POA: Diagnosis not present

## 2015-04-01 DIAGNOSIS — J4520 Mild intermittent asthma, uncomplicated: Secondary | ICD-10-CM | POA: Diagnosis not present

## 2015-04-01 DIAGNOSIS — E890 Postprocedural hypothyroidism: Secondary | ICD-10-CM | POA: Diagnosis not present

## 2015-04-01 DIAGNOSIS — E0521 Thyrotoxicosis with toxic multinodular goiter with thyrotoxic crisis or storm: Secondary | ICD-10-CM | POA: Diagnosis not present

## 2015-04-01 DIAGNOSIS — E042 Nontoxic multinodular goiter: Secondary | ICD-10-CM | POA: Diagnosis not present

## 2015-04-08 DIAGNOSIS — D34 Benign neoplasm of thyroid gland: Secondary | ICD-10-CM | POA: Diagnosis not present

## 2015-04-08 DIAGNOSIS — K229 Disease of esophagus, unspecified: Secondary | ICD-10-CM | POA: Diagnosis not present

## 2015-04-08 DIAGNOSIS — I10 Essential (primary) hypertension: Secondary | ICD-10-CM | POA: Diagnosis not present

## 2015-04-08 DIAGNOSIS — J4520 Mild intermittent asthma, uncomplicated: Secondary | ICD-10-CM | POA: Diagnosis not present

## 2015-04-08 DIAGNOSIS — E890 Postprocedural hypothyroidism: Secondary | ICD-10-CM | POA: Diagnosis not present

## 2015-04-08 DIAGNOSIS — E0521 Thyrotoxicosis with toxic multinodular goiter with thyrotoxic crisis or storm: Secondary | ICD-10-CM | POA: Diagnosis not present

## 2015-04-08 DIAGNOSIS — E042 Nontoxic multinodular goiter: Secondary | ICD-10-CM | POA: Diagnosis not present

## 2015-04-08 DIAGNOSIS — N958 Other specified menopausal and perimenopausal disorders: Secondary | ICD-10-CM | POA: Diagnosis not present

## 2015-04-22 ENCOUNTER — Other Ambulatory Visit: Payer: Self-pay | Admitting: Family Medicine

## 2015-05-10 ENCOUNTER — Ambulatory Visit (INDEPENDENT_AMBULATORY_CARE_PROVIDER_SITE_OTHER): Payer: Medicare Other | Admitting: Podiatry

## 2015-05-10 ENCOUNTER — Encounter: Payer: Self-pay | Admitting: Podiatry

## 2015-05-10 DIAGNOSIS — B351 Tinea unguium: Secondary | ICD-10-CM

## 2015-05-10 DIAGNOSIS — M79676 Pain in unspecified toe(s): Secondary | ICD-10-CM

## 2015-05-11 NOTE — Progress Notes (Signed)
She presents today with a chief complaint of painful elongated nails.  Objective: Vital signs stable alert and oriented 3. Pulses are palpable. Neurologic sensorium is intact per Semmes-Weinstein monofilament. Deep tendon reflexes are intact. Toenails are thick yellow dystrophic onychomycotic and painful.  Assessment: Pain in limb secondary to onychomycosis.  Plan: Debridement of toenails 1 through 5 bilateral covered service secondary to pain. Remember to asked how her mother-in-law is doing.

## 2015-05-19 DIAGNOSIS — H40013 Open angle with borderline findings, low risk, bilateral: Secondary | ICD-10-CM | POA: Diagnosis not present

## 2015-05-19 DIAGNOSIS — Z0100 Encounter for examination of eyes and vision without abnormal findings: Secondary | ICD-10-CM | POA: Diagnosis not present

## 2015-06-06 ENCOUNTER — Telehealth: Payer: Self-pay | Admitting: Family Medicine

## 2015-06-06 DIAGNOSIS — E039 Hypothyroidism, unspecified: Secondary | ICD-10-CM

## 2015-06-06 DIAGNOSIS — I10 Essential (primary) hypertension: Secondary | ICD-10-CM

## 2015-06-06 DIAGNOSIS — R739 Hyperglycemia, unspecified: Secondary | ICD-10-CM

## 2015-06-06 DIAGNOSIS — E785 Hyperlipidemia, unspecified: Secondary | ICD-10-CM

## 2015-06-06 NOTE — Telephone Encounter (Signed)
-----   Message from Ellamae Sia sent at 06/04/2015 11:57 AM EDT ----- Regarding: Lab orders for Tuesday, 5.30.17 Patient is scheduled for CPX labs, please order future labs, Thanks , Karna Christmas

## 2015-06-15 ENCOUNTER — Other Ambulatory Visit (INDEPENDENT_AMBULATORY_CARE_PROVIDER_SITE_OTHER): Payer: Medicare Other

## 2015-06-15 ENCOUNTER — Ambulatory Visit (INDEPENDENT_AMBULATORY_CARE_PROVIDER_SITE_OTHER): Payer: Medicare Other | Admitting: Primary Care

## 2015-06-15 ENCOUNTER — Encounter: Payer: Self-pay | Admitting: Primary Care

## 2015-06-15 ENCOUNTER — Telehealth: Payer: Self-pay

## 2015-06-15 ENCOUNTER — Other Ambulatory Visit: Payer: Self-pay | Admitting: Family Medicine

## 2015-06-15 VITALS — BP 138/84 | HR 71 | Temp 98.0°F | Ht 67.75 in | Wt 246.8 lb

## 2015-06-15 DIAGNOSIS — R739 Hyperglycemia, unspecified: Secondary | ICD-10-CM

## 2015-06-15 DIAGNOSIS — W57XXXA Bitten or stung by nonvenomous insect and other nonvenomous arthropods, initial encounter: Secondary | ICD-10-CM | POA: Diagnosis not present

## 2015-06-15 DIAGNOSIS — E039 Hypothyroidism, unspecified: Secondary | ICD-10-CM | POA: Diagnosis not present

## 2015-06-15 DIAGNOSIS — E785 Hyperlipidemia, unspecified: Secondary | ICD-10-CM | POA: Diagnosis not present

## 2015-06-15 DIAGNOSIS — I10 Essential (primary) hypertension: Secondary | ICD-10-CM | POA: Diagnosis not present

## 2015-06-15 DIAGNOSIS — T148 Other injury of unspecified body region: Secondary | ICD-10-CM | POA: Diagnosis not present

## 2015-06-15 LAB — CBC WITH DIFFERENTIAL/PLATELET
BASOS ABS: 0 10*3/uL (ref 0.0–0.1)
Basophils Relative: 0.8 % (ref 0.0–3.0)
EOS ABS: 0.2 10*3/uL (ref 0.0–0.7)
EOS PCT: 3.7 % (ref 0.0–5.0)
HCT: 40.3 % (ref 36.0–46.0)
HEMOGLOBIN: 13.1 g/dL (ref 12.0–15.0)
Lymphocytes Relative: 27.4 % (ref 12.0–46.0)
Lymphs Abs: 1.7 10*3/uL (ref 0.7–4.0)
MCHC: 32.7 g/dL (ref 30.0–36.0)
MCV: 82.6 fl (ref 78.0–100.0)
MONO ABS: 0.6 10*3/uL (ref 0.1–1.0)
Monocytes Relative: 10.4 % (ref 3.0–12.0)
Neutro Abs: 3.5 10*3/uL (ref 1.4–7.7)
Neutrophils Relative %: 57.7 % (ref 43.0–77.0)
Platelets: 338 10*3/uL (ref 150.0–400.0)
RBC: 4.88 Mil/uL (ref 3.87–5.11)
RDW: 16.5 % — ABNORMAL HIGH (ref 11.5–15.5)
WBC: 6.1 10*3/uL (ref 4.0–10.5)

## 2015-06-15 LAB — COMPREHENSIVE METABOLIC PANEL
ALBUMIN: 3.8 g/dL (ref 3.5–5.2)
ALK PHOS: 75 U/L (ref 39–117)
ALT: 23 U/L (ref 0–35)
AST: 22 U/L (ref 0–37)
BUN: 18 mg/dL (ref 6–23)
CO2: 30 mEq/L (ref 19–32)
CREATININE: 0.88 mg/dL (ref 0.40–1.20)
Calcium: 9.7 mg/dL (ref 8.4–10.5)
Chloride: 103 mEq/L (ref 96–112)
GFR: 67.43 mL/min (ref >60.00)
Glucose, Bld: 92 mg/dL (ref 70–99)
Potassium: 3.7 mEq/L (ref 3.5–5.1)
SODIUM: 140 meq/L (ref 135–145)
TOTAL PROTEIN: 6.9 g/dL (ref 6.0–8.3)
Total Bilirubin: 0.4 mg/dL (ref 0.2–1.2)

## 2015-06-15 LAB — TSH: TSH: 6.71 u[IU]/mL — AB (ref 0.35–4.50)

## 2015-06-15 LAB — LIPID PANEL
CHOL/HDL RATIO: 4
CHOLESTEROL: 182 mg/dL (ref 0–200)
HDL: 41.6 mg/dL (ref >39.00)
NonHDL: 140.6
Triglycerides: 209 mg/dL — ABNORMAL HIGH (ref 0.0–149.0)
VLDL: 41.8 mg/dL — ABNORMAL HIGH (ref 0.0–40.0)

## 2015-06-15 LAB — LDL CHOLESTEROL, DIRECT: LDL DIRECT: 105 mg/dL

## 2015-06-15 LAB — HEMOGLOBIN A1C: HEMOGLOBIN A1C: 6.1 % (ref 4.6–6.5)

## 2015-06-15 NOTE — Telephone Encounter (Signed)
Pt removed tick from rt hip area on 06/12/15; pt has cleansed area with soap and water but has not applied abx ointment due to allergy to neosporin. Area is red about size larger than dime. Pt does not have any problem moving neck and no h/a. Pt concerned because this is 1st time area has reddened after removing a tick. Pt has appt to see Allie Bossier NP on 06/15/15 at 11:30.

## 2015-06-15 NOTE — Patient Instructions (Signed)
Your tick bite does not appear suspicious for Lyme Disease or [facility] Spotted Fever.  Please continue to monitor the rash and notify myself or Dr. Glori Bickers if your rash enlarges and/or takes the shape of a bull's eye target. Please also notify us if you develop fevers, fatigue, headaches that are persistent.  Please have Dr. Glori Bickers take a look at the bite on Friday this week.  You may apply cortisone cream twice daily to help with itching.  It was a pleasure meeting you!

## 2015-06-15 NOTE — Progress Notes (Signed)
Pre visit review using our clinic review tool, if applicable. No additional management support is needed unless otherwise documented below in the visit note. 

## 2015-06-15 NOTE — Progress Notes (Signed)
Subjective:    Patient ID: Amber Miles, female    DOB: 16-Jan-1946, 70 y.o.   MRN: [id number]  HPI  [name] is a 70 year old female who presents today with a chief complaint of tick bite. She noticed a tick to her right hip Saturday this past weekend. She removed the tick herself and brought it in today.  She's developed a rash that she first noticed Sunday this week. Her rash is itchy in nature. Denies pain, fevers, chills, fatigue, headaches, nausea. She's applied alcohol to her rash, but nothing else for her itching. Her rash is not growing in size, it's about the same size as it was Sunday.   Review of Systems  Constitutional: Negative for fever, chills and fatigue.  Gastrointestinal: Negative for nausea.  Musculoskeletal: Negative for myalgias.  Skin: Positive for rash.  Neurological: Negative for headaches.       Past Medical History  Diagnosis Date  . Hypothyroid   . Hyperlipidemia   . Hypertension   . Arthritis     OA knee replacement  . GERD (gastroesophageal reflux disease)   . Allergy     allergic rhinitis  . Reactive airways dysfunction syndrome   . Anxiety   . Skin lesions, generalized     squamous cell skin lesions and basal cell skin lesions     Social History   Social History  . Marital Status: Married    Spouse Name: N/A  . Number of Children: N/A  . Years of Education: N/A   Occupational History  . Not on file.   Social History Main Topics  . Smoking status: Never Smoker   . Smokeless tobacco: Never Used  . Alcohol Use: No  . Drug Use: No  . Sexual Activity: Not on file   Other Topics Concern  . Not on file   Social History Narrative    Past Surgical History  Procedure Laterality Date  . Tsa    . Abdominal hysterectomy  1990s    hyst with oophrectomy for B9lesion/cyst and prolapse  . Recocele      and cystocele repair  . Joint replacement  05/2009    left total knee replacement    Family History  Problem Relation Age of Onset   . Hypertension Mother   . Stroke Mother   . Depression Mother   . Alzheimer's disease Mother   . Heart disease Father   . Hypertension Sister     Allergies  Allergen Reactions  . Neosporin [Neomycin-Polymyxin-Gramicidin] Other (See Comments)    blisters  . Tape Swelling    Itching and redness Pt said severe reaction to surgical tape.? latex    Current Outpatient Prescriptions on File Prior to Visit  Medication Sig Dispense Refill  . acyclovir (ZOVIRAX) 800 MG tablet TAKE 1 TABLET BY MOUTH 3 TIMES DAILY FOR 3 DAYS FLARES  2  . carisoprodol (SOMA) 350 MG tablet TAKE 1 TABLET BY MOUTH 3 TIMES DAILY AS NEEDED 90 tablet 0  . esomeprazole (NEXIUM) 40 MG capsule TAKE ONE CAPSULE BY MOUTH EVERY MORNING BEFORE BREAKFAST 90 capsule 1  . estradiol (VIVELLE-DOT) 0.0375 MG/24HR PLACE 1 PATCH ONTO THE SKIN 2 (TWO) TIMES A WEEK. 8 patch 1  . Glucosamine-Chondroitin 750-600 MG TABS Take 1 tablet by mouth 2 (two) times daily.      . montelukast (SINGULAIR) 10 MG tablet Take 10 mg by mouth daily.     . Multiple Vitamin (MULTIVITAMIN) tablet Take 1 tablet by mouth  daily.      . mupirocin ointment (BACTROBAN) 2 % Apply 1 application topically daily. (Patient taking differently: Place 1 application into both nostrils daily. ) 22 g 0  . Olopatadine HCl (PATANASE NA) Place 2 sprays into the nose 2 (two) times daily.    . potassium chloride SA (KLOR-CON M20) 20 MEQ tablet TAKE 1 TABLET (20 MEQ TOTAL) BY MOUTH ONCE. 90 tablet 1  . PROAIR HFA 108 (90 BASE) MCG/ACT inhaler USE 2 PUFFS EVERY 4 HOURS AS NEEDED FOR WHEEZING 8.5 each 4  . QVAR 80 MCG/ACT inhaler Inhale 2 puffs into the lungs 2 (two) times daily.     . traMADol (ULTRAM) 50 MG tablet Take 2 tablets (100 mg total) by mouth every 8 (eight) hours as needed for pain. 90 tablet 0  . triamterene-hydrochlorothiazide (MAXZIDE-25) 37.5-25 MG tablet Take 2 tablets by mouth daily. 180 tablet 2  . verapamil (CALAN-SR) 240 MG CR tablet TAKE 1 TABLET (240 MG  TOTAL) BY MOUTH 2 (TWO) TIMES DAILY. (Patient taking differently: Take 240 mg by mouth daily. TAKE 1 TABLET (240 MG TOTAL) BY MOUTH 2 (TWO) TIMES DAILY.) 180 tablet 1   No current facility-administered medications on file prior to visit.    BP 138/84 mmHg  Pulse 71  Temp(Src) 98 F (36.7 C) (Oral)  Ht 5' 7.75" (1.721 m)  Wt 246 lb 12.8 oz (111.948 kg)  BMI 37.80 kg/m2  SpO2 98%    Objective:   Physical Exam  Constitutional: She appears well-nourished.  Cardiovascular: Normal rate and regular rhythm.   Pulmonary/Chest: Effort normal and breath sounds normal.  Skin: Skin is warm and dry.  2 cm diameter reddened rash without central clearing to right hip. Mildly raised bump at the side of attachment. Intact.          Assessment & Plan:  Tick Bite:  Found tick Saturday night, pulled off. Rash to right hip since Sunday. No s/s associated with Lyme Disease or RMSF. Rash does not appear suspicious. She is to see PCP this Friday, will have PCP evaluate rash as well later this week. Discussed to apply cortisone cream BID for itching. Return precautions provided.

## 2015-06-15 NOTE — Telephone Encounter (Signed)
Noted and addressed.

## 2015-06-18 ENCOUNTER — Ambulatory Visit (INDEPENDENT_AMBULATORY_CARE_PROVIDER_SITE_OTHER): Payer: Medicare Other | Admitting: Family Medicine

## 2015-06-18 ENCOUNTER — Encounter: Payer: Self-pay | Admitting: Family Medicine

## 2015-06-18 VITALS — BP 132/80 | HR 75 | Temp 98.5°F | Ht 67.75 in | Wt 246.5 lb

## 2015-06-18 DIAGNOSIS — I10 Essential (primary) hypertension: Secondary | ICD-10-CM

## 2015-06-18 DIAGNOSIS — E785 Hyperlipidemia, unspecified: Secondary | ICD-10-CM | POA: Diagnosis not present

## 2015-06-18 DIAGNOSIS — E669 Obesity, unspecified: Secondary | ICD-10-CM

## 2015-06-18 DIAGNOSIS — R739 Hyperglycemia, unspecified: Secondary | ICD-10-CM

## 2015-06-18 DIAGNOSIS — E039 Hypothyroidism, unspecified: Secondary | ICD-10-CM

## 2015-06-18 MED ORDER — MUPIROCIN 2 % EX OINT: 1.0000 "application " | TOPICAL_OINTMENT | Freq: Every day | CUTANEOUS | Status: DC | PRN

## 2015-06-18 MED ORDER — TRAMADOL HCL 50 MG PO TABS: 100.0000 mg | ORAL_TABLET | Freq: Three times a day (TID) | ORAL | Status: DC | PRN

## 2015-06-18 MED ORDER — TIZANIDINE HCL 4 MG PO TABS: 4.0000 mg | ORAL_TABLET | Freq: Four times a day (QID) | ORAL | Status: DC | PRN

## 2015-06-18 NOTE — Progress Notes (Signed)
Pre visit review using our clinic review tool, if applicable. No additional management support is needed unless otherwise documented below in the visit note. 

## 2015-06-18 NOTE — Patient Instructions (Signed)
Stop up front to schedule your medicare visit with our nurse Iva Boop will be due for a colonoscopy in 7/17 with Dr Allyn Kenner - let us know if you need a referral  Mammogram is not due until October  Your TSH is up a bit - I know Dr Jerilynn Mages adjusted your levothyroxine- he will want to keep an eye on that Eat a lower sugar/carbohydrate diet to prevent diabetes  Stay active and control weight

## 2015-06-18 NOTE — Progress Notes (Signed)
Subjective:    Patient ID: Amber Miles, female    DOB: 1945/08/20, 70 y.o.   MRN: [id number]  HPI Here for annual f/u of chronic health problems   Feeling pretty good overall for her age   [place] is up 9 lb since last summer with bmi of 63 (obese) Trying to get exercise -likes to walk / gets lazy at times  Walks indoors for the most part  Does not feel steady on uneven surfaces   Has not had hep C screen  Colonoscopy was 7/12- with 5 year recall due to family hx of colon cancer  She plans to do that with Dr Earlean Shawl   Mammogram 10/16-normal after addn views  Self breast exam -no lumps  No gyn problems   utd imms   dexa 3/15 -in the normal range  No falls  No fractures in lifetime   bp is stable today  No cp or palpitations or headaches or edema  No side effects to medicines  BP Readings from Last 3 Encounters:  06/18/15 132/80  06/15/15 138/84  12/18/14 136/84     Hypothyroidism  Pt has no clinical changes No change in energy level/ hair or skin/ edema and no tremor Lab Results  Component Value Date   TSH 6.71* 06/15/2015   her levothyroxine was changed to 100 in march  May need increased dose  Will let Dr Carmela Rima know - he follows her thyroid   Hyperlipidemia Lab Results  Component Value Date   CHOL 182 06/15/2015   CHOL 170 05/15/2014   CHOL 190 12/09/2012   Lab Results  Component Value Date   HDL 41.60 06/15/2015   HDL 44.00 05/15/2014   HDL 48.00 12/09/2012   Lab Results  Component Value Date   LDLCALC 103* 05/15/2014   LDLCALC 112* 12/09/2012   LDLCALC 114* 11/24/2011   Lab Results  Component Value Date   TRIG 209.0* 06/15/2015   TRIG 113.0 05/15/2014   TRIG 152.0* 12/09/2012   Lab Results  Component Value Date   CHOLHDL 4 06/15/2015   CHOLHDL 4 05/15/2014   CHOLHDL 4 12/09/2012   Lab Results  Component Value Date   LDLDIRECT 105.0 06/15/2015  trig up - ? Due to sugar   Hyperglycemia Lab Results  Component Value Date   HGBA1C  6.1 06/15/2015  up from 5.6 Has eaten more sweets and carbohydrates  Has been cooking = sausage gravy and bread   Lab Results  Component Value Date   WBC 6.1 06/15/2015   HGB 13.1 06/15/2015   HCT 40.3 06/15/2015   MCV 82.6 06/15/2015   PLT 338.0 06/15/2015     Chemistry      Component Value Date/Time   NA 140 06/15/2015 0930   K 3.7 06/15/2015 0930   CL 103 06/15/2015 0930   CO2 30 06/15/2015 0930   BUN 18 06/15/2015 0930   CREATININE 0.88 06/15/2015 0930   CREATININE 1.05 08/26/2012 1134      Component Value Date/Time   CALCIUM 9.7 06/15/2015 0930   ALKPHOS 75 06/15/2015 0930   AST 22 06/15/2015 0930   ALT 23 06/15/2015 0930   BILITOT 0.4 06/15/2015 0930       Patient Active Problem List   Diagnosis Date Noted  . Pedal edema 05/27/2014  . Estrogen deficiency 02/21/2013  . Encounter for Medicare annual wellness exam 02/21/2013  . Low back pain potentially associated with radiculopathy 10/11/2012  . 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 ALLERGIC RHINITIS 01/28/2010  .  Hyperglycemia 07/30/2009  . ANXIETY 11/30/2008  . Hyperlipidemia 04/13/2008  . Hypothyroidism 01/07/2008  . Obesity 01/07/2008  . HYPERTENSION, BENIGN 01/07/2008  . 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 Hypothyroid   . Hyperlipidemia   . Hypertension   . Arthritis     OA knee replacement  . GERD (gastroesophageal reflux disease)   . Allergy     allergic rhinitis  . Reactive airways dysfunction syndrome   . Anxiety   . Skin lesions, generalized     squamous cell skin lesions and basal cell skin lesions   Past Surgical History  Procedure Laterality Date  . Tsa    . Abdominal hysterectomy  1990s    hyst with oophrectomy for B9lesion/cyst and prolapse  . Recocele      and cystocele repair  . Joint replacement  05/2009    left total knee replacement   Social History  Substance Use Topics  . Smoking status: Never Smoker   . Smokeless tobacco:  Never Used  . Alcohol Use: No   Family History  Problem Relation Age of Onset  . Hypertension Mother   . Stroke Mother   . Depression Mother   . Alzheimer's disease Mother   . Heart disease Father   . Hypertension Sister    Allergies  Allergen Reactions  . Neosporin [Neomycin-Polymyxin-Gramicidin] Other (See Comments)    blisters  . Tape Swelling    Itching and redness Pt said severe reaction to surgical tape.? latex   Current Outpatient Prescriptions on File Prior to Visit  Medication Sig Dispense Refill  . acyclovir (ZOVIRAX) 800 MG tablet TAKE 1 TABLET BY MOUTH 3 TIMES DAILY FOR 3 DAYS FLARES  2  . esomeprazole (NEXIUM) 40 MG capsule TAKE ONE CAPSULE BY MOUTH EVERY MORNING BEFORE BREAKFAST 90 capsule 1  . estradiol (VIVELLE-DOT) 0.0375 MG/24HR PLACE 1 PATCH ONTO THE SKIN 2 (TWO) TIMES A WEEK. 8 patch 1  . Glucosamine-Chondroitin 750-600 MG TABS Take 1 tablet by mouth 2 (two) times daily.      Marland Kitchen KLOR-CON M20 20 MEQ tablet TAKE 1 TABLET (20 MEQ TOTAL) BY MOUTH ONCE. 90 tablet 1  . montelukast (SINGULAIR) 10 MG tablet Take 10 mg by mouth daily.     . Multiple Vitamin (MULTIVITAMIN) tablet Take 1 tablet by mouth daily.      . Olopatadine HCl (PATANASE NA) Place 2 sprays into the nose 2 (two) times daily.    Marland Kitchen PROAIR HFA 108 (90 BASE) MCG/ACT inhaler USE 2 PUFFS EVERY 4 HOURS AS NEEDED FOR WHEEZING 8.5 each 4  . QVAR 80 MCG/ACT inhaler Inhale 2 puffs into the lungs 2 (two) times daily.     Marland Kitchen SYNTHROID 100 MCG tablet TAKE 1 TABLET EVERY DAY BY MOUTH  11  . triamterene-hydrochlorothiazide (MAXZIDE-25) 37.5-25 MG tablet Take 2 tablets by mouth daily. 180 tablet 2  . verapamil (CALAN-SR) 240 MG CR tablet TAKE 1 TABLET (240 MG TOTAL) BY MOUTH 2 (TWO) TIMES DAILY. (Patient taking differently: Take 240 mg by mouth daily. TAKE 1 TABLET (240 MG TOTAL) BY MOUTH 2 (TWO) TIMES DAILY.) 180 tablet 1   No current facility-administered medications on file prior to visit.    Review of  Systems Review of Systems  Constitutional: Negative for fever, appetite change, fatigue and unexpected weight change.  Eyes: Negative for pain and visual disturbance.  Respiratory: Negative for cough and shortness of breath.   Cardiovascular: Negative for cp or palpitations  Gastrointestinal: Negative for nausea, diarrhea and constipation.  Genitourinary: Negative for urgency and frequency.  Skin: Negative for pallor or rash   Neurological: Negative for weakness, light-headedness, numbness and headaches.  Hematological: Negative for adenopathy. Does not bruise/bleed easily.  Psychiatric/Behavioral: Negative for dysphoric mood. The patient is not nervous/anxious.         Objective:   Physical Exam  Constitutional: She appears well-developed and well-nourished. No distress.  obese and well appearing   HENT:  Head: Normocephalic and atraumatic.  Right Ear: External ear normal.  Left Ear: External ear normal.  Mouth/Throat: Oropharynx is clear and moist.  Eyes: Conjunctivae and EOM are normal. Pupils are equal, round, and reactive to light. No scleral icterus.  Neck: Normal range of motion. Neck supple. No JVD present. Carotid bruit is not present. No thyromegaly present.  Cardiovascular: Normal rate, regular rhythm, normal heart sounds and intact distal pulses.  Exam reveals no gallop.   Pulmonary/Chest: Effort normal and breath sounds normal. No respiratory distress. She has no wheezes. She exhibits no tenderness.  Abdominal: Soft. Bowel sounds are normal. She exhibits no distension, no abdominal bruit and no mass. There is no tenderness.  Genitourinary: No breast swelling, tenderness, discharge or bleeding.  Breast exam: No mass, nodules, thickening, tenderness, bulging, retraction, inflamation, nipple discharge or skin changes noted.  No axillary or clavicular LA.      Musculoskeletal: Normal range of motion. She exhibits no edema or tenderness.  Lymphadenopathy:    She has no  cervical adenopathy.  Neurological: She is alert. She has normal reflexes. No cranial nerve deficit. She exhibits normal muscle tone. Coordination normal.  Skin: Skin is warm and dry. No rash noted. No erythema. No pallor.  Solar lentigines and SK diffusely  Psychiatric: She has a normal mood and affect.          Assessment & Plan:   Problem List Items Addressed This Visit      Cardiovascular and Mediastinum   HYPERTENSION, BENIGN - Primary    bp in fair control at this time  BP Readings from Last 1 Encounters:  06/18/15 132/80   No changes needed Disc lifstyle change with low sodium diet and exercise  Labs reviewed         Endocrine   Hypothyroidism    Sees endocrinology  Recent dose change Lab Results  Component Value Date   TSH 6.71* 06/15/2015    Will forward to Dr Ronnald Collum        Other   Obesity    Discussed how this problem influences overall health and the risks it imposes  Reviewed plan for weight loss with lower calorie diet (via better food choices and also portion control or program like weight watchers) and exercise building up to or more than 30 minutes 5 days per week including some aerobic activity         Hyperlipidemia    Triglycerides are up - likely due to inc in blood glucose  Disc imp of low fat and low sugar diet Continue to follow  Disc goals for lipids and reasons to control them Rev labs with pt Rev low sat fat diet in detail       Hyperglycemia    A1C is up  Lab Results  Component Value Date   HGBA1C 6.1 06/15/2015   Disc imp of wt loss and low glycemic diet to prevent DM F/u 80mo

## 2015-06-20 ENCOUNTER — Other Ambulatory Visit: Payer: Self-pay | Admitting: Family Medicine

## 2015-06-20 NOTE — Assessment & Plan Note (Signed)
bp in fair control at this time  BP Readings from Last 1 Encounters:  06/18/15 132/80   No changes needed Disc lifstyle change with low sodium diet and exercise  Labs reviewed

## 2015-06-20 NOTE — Assessment & Plan Note (Signed)
A1C is up  Lab Results  Component Value Date   HGBA1C 6.1 06/15/2015   Disc imp of wt loss and low glycemic diet to prevent DM F/u 69mo

## 2015-06-20 NOTE — Assessment & Plan Note (Signed)
Discussed how this problem influences overall health and the risks it imposes  Reviewed plan for weight loss with lower calorie diet (via better food choices and also portion control or program like weight watchers) and exercise building up to or more than 30 minutes 5 days per week including some aerobic activity    

## 2015-06-20 NOTE — Assessment & Plan Note (Signed)
Triglycerides are up - likely due to inc in blood glucose  Disc imp of low fat and low sugar diet Continue to follow  Disc goals for lipids and reasons to control them Rev labs with pt Rev low sat fat diet in detail

## 2015-06-20 NOTE — Assessment & Plan Note (Signed)
Sees endocrinology  Recent dose change Lab Results  Component Value Date   TSH 6.71* 06/15/2015    Will forward to Dr Ronnald Collum

## 2015-06-22 ENCOUNTER — Ambulatory Visit (INDEPENDENT_AMBULATORY_CARE_PROVIDER_SITE_OTHER): Payer: Medicare Other

## 2015-06-22 VITALS — BP 160/86 | HR 65 | Temp 97.8°F | Ht 68.0 in | Wt 249.5 lb

## 2015-06-22 DIAGNOSIS — Z0000 Encounter for general adult medical examination without abnormal findings: Secondary | ICD-10-CM

## 2015-06-22 DIAGNOSIS — Z1159 Encounter for screening for other viral diseases: Secondary | ICD-10-CM | POA: Diagnosis not present

## 2015-06-22 NOTE — Progress Notes (Signed)
Subjective:   Amber Miles is a 70 y.o. female who presents for Medicare Annual (Subsequent) preventive examination.  Review of Systems:  N/A Cardiac Risk Factors include: advanced age (>23men, >46 women);obesity (BMI >30kg/m2);hypertension;dyslipidemia     Objective:     Vitals: BP 160/86 mmHg  Pulse 65  Temp(Src) 97.8 F (36.6 C) (Oral)  Ht 5\' 8"  (1.727 m)  Wt 249 lb 8 oz (113.172 kg)  BMI 37.94 kg/m2  SpO2 97%  Body mass index is 37.94 kg/(m^2).   Tobacco History  Smoking status  . Never Smoker   Smokeless tobacco  . Never Used     Counseling given: No   Past Medical History  Diagnosis Date  . Hypothyroid   . Hyperlipidemia   . Hypertension   . Arthritis     OA knee replacement  . GERD (gastroesophageal reflux disease)   . Allergy     allergic rhinitis  . Reactive airways dysfunction syndrome   . Anxiety   . Skin lesions, generalized     squamous cell skin lesions and basal cell skin lesions   Past Surgical History  Procedure Laterality Date  . Tsa    . Abdominal hysterectomy  1990s    hyst with oophrectomy for B9lesion/cyst and prolapse  . Recocele      and cystocele repair  . Joint replacement  05/2009    left total knee replacement   Family History  Problem Relation Age of Onset  . Hypertension Mother   . Stroke Mother   . Depression Mother   . Alzheimer's disease Mother   . Heart disease Father   . Hypertension Sister    History  Sexual Activity  . Sexual Activity: No    Outpatient Encounter Prescriptions as of 06/22/2015  Medication Sig  . acyclovir (ZOVIRAX) 800 MG tablet TAKE 1 TABLET BY MOUTH 3 TIMES DAILY FOR 3 DAYS FLARES  . esomeprazole (NEXIUM) 40 MG capsule TAKE ONE CAPSULE BY MOUTH EVERY MORNING BEFORE BREAKFAST  . estradiol (VIVELLE-DOT) 0.0375 MG/24HR PLACE 1 PATCH ONTO THE SKIN 2 (TWO) TIMES A WEEK.  . Glucosamine-Chondroitin 750-600 MG TABS Take 1 tablet by mouth 2 (two) times daily.    Marland Kitchen KLOR-CON M20 20 MEQ tablet  TAKE 1 TABLET (20 MEQ TOTAL) BY MOUTH ONCE.  . montelukast (SINGULAIR) 10 MG tablet Take 10 mg by mouth daily.   . Multiple Vitamin (MULTIVITAMIN) tablet Take 1 tablet by mouth daily.    . mupirocin ointment (BACTROBAN) 2 % Apply 1 application topically daily as needed.  . Olopatadine HCl (PATANASE NA) Place 2 sprays into the nose 2 (two) times daily.  Marland Kitchen PROAIR HFA 108 (90 BASE) MCG/ACT inhaler USE 2 PUFFS EVERY 4 HOURS AS NEEDED FOR WHEEZING  . QVAR 80 MCG/ACT inhaler Inhale 2 puffs into the lungs 2 (two) times daily.   Marland Kitchen SYNTHROID 100 MCG tablet TAKE 1 TABLET EVERY DAY BY MOUTH  . tiZANidine (ZANAFLEX) 4 MG tablet Take 1 tablet (4 mg total) by mouth every 6 (six) hours as needed.  . traMADol (ULTRAM) 50 MG tablet Take 2 tablets (100 mg total) by mouth every 8 (eight) hours as needed.  . triamterene-hydrochlorothiazide (MAXZIDE-25) 37.5-25 MG tablet Take 2 tablets by mouth daily.  . verapamil (CALAN-SR) 240 MG CR tablet TAKE 1 TABLET (240 MG TOTAL) BY MOUTH 2 (TWO) TIMES DAILY. (Patient taking differently: Take 240 mg by mouth daily. TAKE 1 TABLET (240 MG TOTAL) BY MOUTH 2 (TWO) TIMES DAILY.)   No  facility-administered encounter medications on file as of 06/22/2015.    Activities of Daily Living In your present state of health, do you have any difficulty performing the following activities: 06/22/2015  Hearing? Y  Vision? N  Difficulty concentrating or making decisions? N  Walking or climbing stairs? N  Dressing or bathing? N  Doing errands, shopping? N  Preparing Food and eating ? N  Using the Toilet? N  In the past six months, have you accidently leaked urine? N  Do you have problems with loss of bowel control? N  Managing your Medications? N  Managing your Finances? N  Housekeeping or managing your Housekeeping? N    Patient Care Team: [name], MD as PCP - General Ralene Bathe, MD as Consulting Physician (Ophthalmology)    Assessment:     Hearing Screening   125Hz   250Hz  500Hz  1000Hz  2000Hz  4000Hz  8000Hz   Right ear:   40 40 40 40   Left ear:   40 40 0 0   Vision Screening Comments: Last eye exam on May 19, 2015 with Dr. Claudean Kinds   Exercise Activities and Dietary recommendations Current Exercise Habits: Home exercise routine, Type of exercise: walking, Time (Minutes): 15, Frequency (Times/Week): 7, Weekly Exercise (Minutes/Week): 105, Intensity: Mild, Exercise limited by: None identified  Goals    . Increase physical activity     Starting 06/22/2015, I will continue to walk at least 15 min daily.       Fall Risk Fall Risk  06/22/2015 05/27/2014 02/21/2013  Falls in the past year? No No No   Depression Screen PHQ 2/9 Scores 06/22/2015 05/27/2014 02/21/2013  PHQ - 2 Score 0 0 0     Cognitive Testing MMSE - Mini Mental State Exam 06/22/2015  Orientation to time 5  Orientation to Place 5  Registration 3  Attention/ Calculation 0  Recall 3  Language- name 2 objects 0  Language- repeat 1  Language- follow 3 step command 3  Language- read & follow direction 0  Write a sentence 0  Copy design 0  Total score 20   PLEASE NOTE: A Mini-Cog screen was completed. Maximum score is 20. A value of 0 denotes this part of Folstein MMSE was not completed or the patient failed this part of the Mini-Cog screening.   Mini-Cog Screening Orientation to Time - Max 5 pts Orientation to Place - Max 5 pts Registration - Max 3 pts Recall - Max 3 pts Language Repeat - Max 1 pts Language Follow 3 Step Command - Max 3 pts  Immunization History  Administered Date(s) Administered  . Influenza Split 10/18/2010  . Influenza Whole 11/30/2006, 10/28/2008, 10/27/2009  . Influenza-Unspecified 10/07/2012, 08/30/2013, 10/01/2014  . Pneumococcal Conjugate-13 05/27/2014  . Pneumococcal Polysaccharide-23 12/04/2011  . Td 12/04/2011  . Zoster 10/05/2010   Screening Tests Health Maintenance  Topic Date Due  . COLONOSCOPY  08/08/2015  . INFLUENZA VACCINE  08/17/2015  .  MAMMOGRAM  11/10/2015  . TETANUS/TDAP  12/03/2021  . DEXA SCAN  Completed  . ZOSTAVAX  Completed  . Hepatitis C Screening  Completed  . PNA vac Low Risk Adult  Completed      Plan:     I have personally reviewed and addressed the Medicare Annual Wellness questionnaire and have noted the following in the patient's chart:  A. Medical and social history B. Use of alcohol, tobacco or illicit drugs  C. Current medications and supplements D. Functional ability and status E.  Nutritional status F.  Physical  activity G. Advance directives H. List of other physicians I.  Hospitalizations, surgeries, and ER visits in previous 12 months J.  [place] to include hearing, vision, cognitive, depression L. Referrals and appointments - none  In addition, I have reviewed and discussed with patient certain preventive protocols, quality metrics, and best practice recommendations. A written personalized care plan for preventive services as well as general preventive health recommendations were provided to patient.  See attached scanned questionnaire for additional information.   Signed,   Lindell Noe, MHA, BS, LPN Health Advisor

## 2015-06-22 NOTE — Progress Notes (Signed)
Pre visit review using our clinic review tool, if applicable. No additional management support is needed unless otherwise documented below in the visit note. 

## 2015-06-22 NOTE — Patient Instructions (Signed)
Amber Miles , Thank you for taking time to come for your Medicare Wellness Visit. I appreciate your ongoing commitment to your health goals. Please review the following plan we discussed and let me know if I can assist you in the future.   These are the goals we discussed: Goals    . Increase physical activity     Starting 06/22/2015, I will continue to walk at least 15 min daily.        This is a list of the screening recommended for you and due dates:  Health Maintenance  Topic Date Due  . Colon Cancer Screening  08/08/2015  . Flu Shot  08/17/2015  . Mammogram  11/10/2015  . Tetanus Vaccine  12/03/2021  . DEXA scan (bone density measurement)  Completed  . Shingles Vaccine  Completed  .  Hepatitis C: One time screening is recommended by Center for Disease Control  (CDC) for  adults born from [birth year] through 1966.   Completed  . Pneumonia vaccines  Completed    Preventive Care for Adults  A healthy lifestyle and preventive care can promote health and wellness. Preventive health guidelines for adults include the following key practices.  . A routine yearly physical is a good way to check with your health care provider about your health and preventive screening. It is a chance to share any concerns and updates on your health and to receive a thorough exam.  . Visit your dentist for a routine exam and preventive care every 6 months. Brush your teeth twice a day and floss once a day. Good oral hygiene prevents tooth decay and gum disease.  . The frequency of eye exams is based on your age, health, family medical history, use  of contact lenses, and other factors. Follow your health care provider's ecommendations for frequency of eye exams.  . Eat a healthy diet. Foods like vegetables, fruits, whole grains, low-fat dairy products, and lean protein foods contain the nutrients you need without too many calories. Decrease your intake of foods high in solid fats, added sugars, and salt. Eat the  right amount of calories for you. Get information about a proper diet from your health care provider, if necessary.  . Regular physical exercise is one of the most important things you can do for your health. Most adults should get at least 150 minutes of moderate-intensity exercise (any activity that increases your heart rate and causes you to sweat) each week. In addition, most adults need muscle-strengthening exercises on 2 or more days a week.  Silver Sneakers may be a benefit available to you. To determine eligibility, you may visit the website: www.silversneakers.com or contact program at (575)589-7005 Mon-Fri between 8AM-8PM.   . Maintain a healthy weight. The body mass index (BMI) is a screening tool to identify possible weight problems. It provides an estimate of body fat based on height and weight. Your health care provider can find your BMI and can help you achieve or maintain a healthy weight.   For adults 20 years and older: ? A BMI below 18.5 is considered underweight. ? A BMI of 18.5 to 24.9 is normal. ? A BMI of 25 to 29.9 is considered overweight. ? A BMI of 30 and above is considered obese.   . Maintain normal blood lipids and cholesterol levels by exercising and minimizing your intake of saturated fat. Eat a balanced diet with plenty of fruit and vegetables. Blood tests for lipids and cholesterol should begin at age 32 and  be repeated every 5 years. If your lipid or cholesterol levels are high, you are over 50, or you are at high risk for heart disease, you may need your cholesterol levels checked more frequently. Ongoing high lipid and cholesterol levels should be treated with medicines if diet and exercise are not working.  . If you smoke, find out from your health care provider how to quit. If you do not use tobacco, please do not start.  . If you choose to drink alcohol, please do not consume more than 2 drinks per day. One drink is considered to be 12 ounces (355 mL) of  beer, 5 ounces (148 mL) of wine, or 1.5 ounces (44 mL) of liquor.  . If you are 73-38 years old, ask your health care provider if you should take aspirin to prevent strokes.  . Use sunscreen. Apply sunscreen liberally and repeatedly throughout the day. You should seek shade when your shadow is shorter than you. Protect yourself by wearing Novacek sleeves, pants, a wide-brimmed hat, and sunglasses year round, whenever you are outdoors.  . Once a month, do a whole body skin exam, using a mirror to look at the skin on your back. Tell your health care provider of new moles, moles that have irregular borders, moles that are larger than a pencil eraser, or moles that have changed in shape or color.

## 2015-06-22 NOTE — Progress Notes (Signed)
PCP notes:  Health maintenance:   Hep C screening - completed  Abnormal screenings:  Hearing - failed.  Patient concerns: None  Nurse concerns: Pt's BP was elevated during encounter. PCP notified.   Next PCP appt: 06/26/2016  I reviewed health advisor's note, was available for consultation, and agree with documentation and plan.

## 2015-06-23 ENCOUNTER — Telehealth: Payer: Self-pay | Admitting: *Deleted

## 2015-06-23 LAB — HEPATITIS C ANTIBODY: HCV Ab: NEGATIVE

## 2015-06-23 NOTE — Telephone Encounter (Signed)
Pt returned your call.  

## 2015-06-23 NOTE — Telephone Encounter (Signed)
Left voicemail requesting pt to call office back 

## 2015-06-23 NOTE — Telephone Encounter (Signed)
-----   Message from [name], MD sent at 06/22/2015 10:25 PM EDT ----- Regarding: FW: Elevated BP If she has a bp cuff at home start checking it at different times when relaxed and keep a log F/u with me in 1-2 mo for bp visit Avoid sodium and drink water   ----- Message -----    From: Eustace Pen, LPN    Sent: [id number]   2:00 PM      To: [name], MD Subject: Elevated BP                                    Dr. Glori Bickers,  Pt was in for AWV today. BP was elevated approx. 11AM. 160/86. BP was taken on 3 different times in both arms. Pt stated she had taken her medication approx. 9AM.    Thanks, Katha Cabal

## 2015-06-24 NOTE — Telephone Encounter (Signed)
Pt notified of Dr. Marliss Coots comments and verbalized understanding. appt scheduled and pt will keep a BP log

## 2015-07-12 ENCOUNTER — Ambulatory Visit (INDEPENDENT_AMBULATORY_CARE_PROVIDER_SITE_OTHER): Payer: Medicare Other | Admitting: Podiatry

## 2015-07-12 DIAGNOSIS — B351 Tinea unguium: Secondary | ICD-10-CM | POA: Diagnosis not present

## 2015-07-12 DIAGNOSIS — M79676 Pain in unspecified toe(s): Secondary | ICD-10-CM

## 2015-07-12 NOTE — Progress Notes (Signed)
She presents today with chief complaint of painful elongated toenails bilateral.  Objective: Pulses are palpable. Toenails are thick yellow dystrophic with mycotic.  Assessment: Pain in limb secondary to onychomycosis.  Plan: Debridement of toenails 1 through 5 bilateral.

## 2015-08-03 DIAGNOSIS — Z8371 Family history of colonic polyps: Secondary | ICD-10-CM | POA: Diagnosis not present

## 2015-08-03 DIAGNOSIS — K635 Polyp of colon: Secondary | ICD-10-CM | POA: Diagnosis not present

## 2015-08-03 DIAGNOSIS — D123 Benign neoplasm of transverse colon: Secondary | ICD-10-CM | POA: Diagnosis not present

## 2015-08-03 DIAGNOSIS — D122 Benign neoplasm of ascending colon: Secondary | ICD-10-CM | POA: Diagnosis not present

## 2015-08-03 DIAGNOSIS — Z1211 Encounter for screening for malignant neoplasm of colon: Secondary | ICD-10-CM | POA: Diagnosis not present

## 2015-08-03 DIAGNOSIS — K5730 Diverticulosis of large intestine without perforation or abscess without bleeding: Secondary | ICD-10-CM | POA: Diagnosis not present

## 2015-08-03 DIAGNOSIS — D124 Benign neoplasm of descending colon: Secondary | ICD-10-CM | POA: Diagnosis not present

## 2015-08-05 ENCOUNTER — Other Ambulatory Visit: Payer: Self-pay | Admitting: Neurosurgery

## 2015-08-05 DIAGNOSIS — D329 Benign neoplasm of meninges, unspecified: Secondary | ICD-10-CM

## 2015-08-19 ENCOUNTER — Ambulatory Visit
Admission: RE | Admit: 2015-08-19 | Discharge: 2015-08-19 | Disposition: A | Discharge status: Home / Self Care | Payer: Medicare Other | Source: Ambulatory Visit | Attending: Neurosurgery | Admitting: Neurosurgery

## 2015-08-19 DIAGNOSIS — D329 Benign neoplasm of meninges, unspecified: Secondary | ICD-10-CM | POA: Diagnosis not present

## 2015-08-19 DIAGNOSIS — R609 Edema, unspecified: Secondary | ICD-10-CM | POA: Insufficient documentation

## 2015-08-19 LAB — POCT I-STAT CREATININE: CREATININE: 0.9 mg/dL (ref 0.44–1.00)

## 2015-08-19 MED ORDER — GADOBENATE DIMEGLUMINE 529 MG/ML IV SOLN
20.0000 mL | Freq: Once | INTRAVENOUS | Status: AC | PRN
  Administered 2015-08-19: 20 mL via INTRAVENOUS

## 2015-08-26 DIAGNOSIS — D320 Benign neoplasm of cerebral meninges: Secondary | ICD-10-CM | POA: Diagnosis not present

## 2015-08-27 ENCOUNTER — Ambulatory Visit (INDEPENDENT_AMBULATORY_CARE_PROVIDER_SITE_OTHER): Payer: Medicare Other | Admitting: Family Medicine

## 2015-08-27 ENCOUNTER — Encounter: Payer: Self-pay | Admitting: Family Medicine

## 2015-08-27 VITALS — BP 144/70 | HR 62 | Temp 98.4°F | Ht 67.75 in | Wt 242.5 lb

## 2015-08-27 DIAGNOSIS — I10 Essential (primary) hypertension: Secondary | ICD-10-CM | POA: Diagnosis not present

## 2015-08-27 DIAGNOSIS — E669 Obesity, unspecified: Secondary | ICD-10-CM

## 2015-08-27 MED ORDER — ESTRADIOL 0.0375 MG/24HR TD PTTW: MEDICATED_PATCH | TRANSDERMAL | 3 refills | Status: DC

## 2015-08-27 MED ORDER — LISINOPRIL 10 MG PO TABS: 10.0000 mg | ORAL_TABLET | Freq: Every day | ORAL | 11 refills | Status: DC

## 2015-08-27 NOTE — Progress Notes (Signed)
Subjective:    Patient ID: Amber Miles, female    DOB: 1945/05/27, 70 y.o.   MRN: [id number]  HPI  Here for f/u of BP  Wt Readings from Last 3 Encounters:  08/27/15 242 lb 8 oz (110 kg)  06/22/15 249 lb 8 oz (113.2 kg)  06/18/15 246 lb 8 oz (111.8 kg)  wt is down 7 lb - eating healthy and smaller portions  Tries to pick the right foods  Watches salt  Tries to exercise but has had some back problems (she carries everything on that side) bmi is 37.1  BP Readings from Last 3 Encounters:  08/27/15 140/86  06/22/15 (!) 160/86  06/18/15 132/80  at home bp has run 130s-160s/70s Have been gradually going up - she uses a wrist cuff Tries to relax when she takes it   Takes verapamil 240 bid- she inc it herself  maxzide 37.5-25 2 tabs daily   Lab Results  Component Value Date   TSH 6.71 (H) 06/15/2015     Dr Jerilynn Mages did adj dose     Chemistry      Component Value Date/Time   NA 140 06/15/2015 0930   K 3.7 06/15/2015 0930   CL 103 06/15/2015 0930   CO2 30 06/15/2015 0930   BUN 18 06/15/2015 0930   CREATININE 0.90 08/19/2015 1053   CREATININE 1.05 08/26/2012 1134      Component Value Date/Time   CALCIUM 9.7 06/15/2015 0930   ALKPHOS 75 06/15/2015 0930   AST 22 06/15/2015 0930   ALT 23 06/15/2015 0930   BILITOT 0.4 06/15/2015 0930     Lab Results  Component Value Date   WBC 6.1 06/15/2015   HGB 13.1 06/15/2015   HCT 40.3 06/15/2015   MCV 82.6 06/15/2015   PLT 338.0 06/15/2015   Patient Active Problem List   Diagnosis Date Noted  . Pedal edema 05/27/2014  . Estrogen deficiency 02/21/2013  . Encounter for Medicare annual wellness exam 02/21/2013  . Low back pain potentially associated with radiculopathy 10/11/2012  . 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 ALLERGIC RHINITIS 01/28/2010  . Hyperglycemia 07/30/2009  . ANXIETY 11/30/2008  . Hyperlipidemia 04/13/2008  . Hypothyroidism 01/07/2008  . Obesity 01/07/2008  . HYPERTENSION, BENIGN 01/07/2008  . OSTEOARTHRITIS,  GENERALIZED 01/07/2008   Past Medical History:  Diagnosis Date  . Allergy    allergic rhinitis  . Anxiety   . Arthritis    OA knee replacement  . GERD (gastroesophageal reflux disease)   . Hyperlipidemia   . Hypertension   . Hypothyroid   . Reactive airways dysfunction syndrome   . Skin lesions, generalized    squamous cell skin lesions and basal cell skin lesions   Past Surgical History:  Procedure Laterality Date  . ABDOMINAL HYSTERECTOMY  1990s   hyst with oophrectomy for B9lesion/cyst and prolapse  . JOINT REPLACEMENT  05/2009   left total knee replacement  . recocele     and cystocele repair  . tsa     Social History  Substance Use Topics  . Smoking status: Never Smoker  . Smokeless tobacco: Never Used  . Alcohol use No   Family History  Problem Relation Age of Onset  . Hypertension Mother   . Stroke Mother   . Depression Mother   . Alzheimer's disease Mother   . Heart disease Father   . Hypertension Sister    Allergies  Allergen Reactions  . Neosporin [Neomycin-Polymyxin-Gramicidin] Other (See Comments)    blisters  .  Tape Swelling    Itching and redness Pt said severe reaction to surgical tape.? latex   Current Outpatient Prescriptions on File Prior to Visit  Medication Sig Dispense Refill  . acyclovir (ZOVIRAX) 800 MG tablet TAKE 1 TABLET BY MOUTH 3 TIMES DAILY FOR 3 DAYS FLARES  2  . esomeprazole (NEXIUM) 40 MG capsule TAKE ONE CAPSULE BY MOUTH EVERY MORNING BEFORE BREAKFAST 90 capsule 1  . Glucosamine-Chondroitin 750-600 MG TABS Take 1 tablet by mouth 2 (two) times daily.      Marland Kitchen KLOR-CON M20 20 MEQ tablet TAKE 1 TABLET (20 MEQ TOTAL) BY MOUTH ONCE. 90 tablet 1  . montelukast (SINGULAIR) 10 MG tablet Take 10 mg by mouth daily.     . Multiple Vitamin (MULTIVITAMIN) tablet Take 1 tablet by mouth daily.      . mupirocin ointment (BACTROBAN) 2 % Apply 1 application topically daily as needed. 22 g 1  . Olopatadine HCl (PATANASE NA) Place 2 sprays into the  nose 2 (two) times daily.    Marland Kitchen PROAIR HFA 108 (90 BASE) MCG/ACT inhaler USE 2 PUFFS EVERY 4 HOURS AS NEEDED FOR WHEEZING 8.5 each 4  . QVAR 80 MCG/ACT inhaler Inhale 2 puffs into the lungs 2 (two) times daily.     Marland Kitchen SYNTHROID 100 MCG tablet TAKE 1 TABLET EVERY DAY BY MOUTH  11  . tiZANidine (ZANAFLEX) 4 MG tablet Take 1 tablet (4 mg total) by mouth every 6 (six) hours as needed. 60 tablet 1  . traMADol (ULTRAM) 50 MG tablet Take 2 tablets (100 mg total) by mouth every 8 (eight) hours as needed. 90 tablet 1  . triamterene-hydrochlorothiazide (MAXZIDE-25) 37.5-25 MG tablet Take 2 tablets by mouth daily. 180 tablet 2  . verapamil (CALAN-SR) 240 MG CR tablet TAKE 1 TABLET (240 MG TOTAL) BY MOUTH 2 (TWO) TIMES DAILY. (Patient taking differently: Take 240 mg by mouth daily. TAKE 1 TABLET (240 MG TOTAL) BY MOUTH 2 (TWO) TIMES DAILY.) 180 tablet 1   No current facility-administered medications on file prior to visit.     Review of Systems Review of Systems  Constitutional: Negative for fever, appetite change, fatigue and unexpected weight change.  Eyes: Negative for pain and visual disturbance.  Respiratory: Negative for cough and shortness of breath.   Cardiovascular: Negative for cp or palpitations    Gastrointestinal: Negative for nausea, diarrhea and constipation.  Genitourinary: Negative for urgency and frequency.  Skin: Negative for pallor or rash   Neurological: Negative for weakness, light-headedness, numbness and headaches.  Hematological: Negative for adenopathy. Does not bruise/bleed easily.  Psychiatric/Behavioral: Negative for dysphoric mood. The patient is not nervous/anxious.         Objective:   Physical Exam  Constitutional: She appears well-developed and well-nourished. No distress.  obese and well appearing   HENT:  Head: Normocephalic and atraumatic.  Mouth/Throat: Oropharynx is clear and moist.  Eyes: Conjunctivae and EOM are normal. Pupils are equal, round, and reactive  to light.  Neck: Normal range of motion. Neck supple. No JVD present. Carotid bruit is not present. No thyromegaly present.  Cardiovascular: Normal rate, regular rhythm, normal heart sounds and intact distal pulses.  Exam reveals no gallop.   Pulmonary/Chest: Effort normal and breath sounds normal. No respiratory distress. She has no wheezes. She has no rales.  No crackles  Abdominal: Soft. Bowel sounds are normal. She exhibits no distension, no abdominal bruit and no mass. There is no tenderness.  Musculoskeletal: She exhibits no edema.  Lymphadenopathy:  She has no cervical adenopathy.  Neurological: She is alert. She has normal reflexes.  Skin: Skin is warm and dry. No rash noted.  Psychiatric: She has a normal mood and affect.          Assessment & Plan:   Problem List Items Addressed This Visit      Cardiovascular and Mediastinum   HYPERTENSION, BENIGN - Primary    bp has been on the rise  Rev how to monitor at home and recommend new size large cuff for arm  Will add lisinopril 10 mg daily - if hypotensive will cut to 1/2 pill  Continue other medicines  Also rev DASH diet and need for wt loss- pt is doing well with that  F/u in about 1 month      Relevant Medications   lisinopril (PRINIVIL,ZESTRIL) 10 MG tablet     Other   Obesity    Discussed how this problem influences overall health and the risks it imposes  Reviewed plan for weight loss with lower calorie diet (via better food choices and also portion control or program like weight watchers) and exercise building up to or more than 30 minutes 5 days per week including some aerobic activity   Commended on wt loss so far        Other Visit Diagnoses   None.

## 2015-08-27 NOTE — Assessment & Plan Note (Signed)
Discussed how this problem influences overall health and the risks it imposes  Reviewed plan for weight loss with lower calorie diet (via better food choices and also portion control or program like weight watchers) and exercise building up to or more than 30 minutes 5 days per week including some aerobic activity   Commended on wt loss so far  

## 2015-08-27 NOTE — Progress Notes (Signed)
Pre visit review using our clinic review tool, if applicable. No additional management support is needed unless otherwise documented below in the visit note. 

## 2015-08-27 NOTE — Assessment & Plan Note (Signed)
bp has been on the rise  Rev how to monitor at home and recommend new size large cuff for arm  Will add lisinopril 10 mg daily - if hypotensive will cut to 1/2 pill  Continue other medicines  Also rev DASH diet and need for wt loss- pt is doing well with that  F/u in about 1 month

## 2015-08-27 NOTE — Patient Instructions (Signed)
For blood pressure- add lisinopril 10mg  each am  If your blood pressure gets lower than 90/50 or you get dizzy then cut to 1/2 pill daily  If you get a cough let us know  Get a blood pressure cuff for the arm (size large) and favorite brand OMRON   Follow up with me in approx 1 month to see how it is working   Watch sodium Work on [employer] loss Drink your water

## 2015-09-01 ENCOUNTER — Encounter: Payer: Self-pay | Admitting: Family Medicine

## 2015-09-01 ENCOUNTER — Telehealth: Payer: Self-pay

## 2015-09-01 DIAGNOSIS — N952 Postmenopausal atrophic vaginitis: Secondary | ICD-10-CM | POA: Insufficient documentation

## 2015-09-01 NOTE — Telephone Encounter (Signed)
Kayla with [name] left v/m requesting status of PA for Estradiol.Please advise.

## 2015-09-01 NOTE — Telephone Encounter (Signed)
PA form was just received, placed in Dr. Marliss Coots inbox to complete

## 2015-09-01 NOTE — Telephone Encounter (Signed)
Done and in IN box This px was transferred to me from her gyn - unsure what she may have tried before/etc.

## 2015-09-02 ENCOUNTER — Ambulatory Visit (INDEPENDENT_AMBULATORY_CARE_PROVIDER_SITE_OTHER)
Admission: RE | Admit: 2015-09-02 | Discharge: 2015-09-02 | Disposition: A | Discharge status: Home / Self Care | Payer: Medicare Other | Source: Ambulatory Visit | Attending: Family Medicine | Admitting: Family Medicine

## 2015-09-02 ENCOUNTER — Encounter: Payer: Self-pay | Admitting: Family Medicine

## 2015-09-02 ENCOUNTER — Ambulatory Visit (INDEPENDENT_AMBULATORY_CARE_PROVIDER_SITE_OTHER): Payer: Medicare Other | Admitting: Family Medicine

## 2015-09-02 VITALS — BP 132/77 | HR 60 | Temp 98.6°F | Ht 67.75 in | Wt 245.2 lb

## 2015-09-02 DIAGNOSIS — M7541 Impingement syndrome of right shoulder: Secondary | ICD-10-CM

## 2015-09-02 DIAGNOSIS — M25511 Pain in right shoulder: Secondary | ICD-10-CM

## 2015-09-02 DIAGNOSIS — M25551 Pain in right hip: Secondary | ICD-10-CM

## 2015-09-02 DIAGNOSIS — M6798 Unspecified disorder of synovium and tendon, other site: Secondary | ICD-10-CM | POA: Diagnosis not present

## 2015-09-02 DIAGNOSIS — M67951 Unspecified disorder of synovium and tendon, right thigh: Secondary | ICD-10-CM

## 2015-09-02 DIAGNOSIS — M19011 Primary osteoarthritis, right shoulder: Secondary | ICD-10-CM | POA: Diagnosis not present

## 2015-09-02 MED ORDER — METHYLPREDNISOLONE ACETATE 40 MG/ML IJ SUSP
80.0000 mg | Freq: Once | INTRAMUSCULAR | Status: AC
Start: 2015-09-02 — End: 2015-09-02
  Administered 2015-09-02: 80 mg via INTRA_ARTICULAR

## 2015-09-02 NOTE — Progress Notes (Signed)
Dr. Frederico Hamman T. Ranisha Allaire, MD, [facility] [place], [address] Phone: [phone] Fax: [phone]  09/02/2015  Patient: Amber Miles, MRN: [id number], DOB: [birth year], 70 y.o.  Primary Physician:  Loura Pardon, MD   Chief Complaint  Patient presents with  . Hip Pain    Right  . Shoulder Pain    Right   Subjective:   [name] is a 70 y.o. very pleasant female patient who presents with the following:  The patient noted above presents with shoulder pain that has been ongoing for several months.  there is no history of trauma or accident. The patient denies neck pain or radicular symptoms. Denies dislocation, subluxation, separation of the shoulder. The patient does not complain of pain in the overhead plane.  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   R  Hip pain. Hurts with sleeping on it. Down her back. Has been on celebrex and had some swelling. More in the posterior of the buttocks in a specific location.  Took 2 zanaflex.   Past Medical History, Surgical History, Social History, Family History, Problem List, Medications, and Allergies have been reviewed and updated if relevant.  Patient Active Problem List   Diagnosis Date Noted  . Atrophic vaginitis 09/01/2015  . Pedal edema 05/27/2014  . Estrogen deficiency 02/21/2013  . Encounter for Medicare annual wellness exam 02/21/2013  . Low back pain potentially associated with radiculopathy 10/11/2012  . 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 ALLERGIC RHINITIS 01/28/2010  . Hyperglycemia 07/30/2009  . ANXIETY 11/30/2008  . Hyperlipidemia 04/13/2008  . Hypothyroidism 01/07/2008  . Obesity 01/07/2008  . HYPERTENSION, BENIGN 01/07/2008  . OSTEOARTHRITIS, GENERALIZED 01/07/2008    Past Medical History:   Diagnosis Date  . Allergy    allergic rhinitis  . Anxiety   . Arthritis    OA knee replacement  . GERD (gastroesophageal reflux disease)   . Hyperlipidemia   . Hypertension   . Hypothyroid   . Reactive airways dysfunction syndrome   . Skin lesions, generalized    squamous cell skin lesions and basal cell skin lesions    Past Surgical History:  Procedure Laterality Date  . ABDOMINAL HYSTERECTOMY  1990s   hyst with oophrectomy for B9lesion/cyst and prolapse  . JOINT REPLACEMENT  05/2009   left total knee replacement  . recocele     and cystocele repair  . tsa      Social History   Social History  . Marital status: Widowed    Spouse name: N/A  . Number of children: N/A  . Years of education: N/A   Occupational History  . Not on file.   Social History Main Topics  . Smoking status: Never Smoker  . Smokeless tobacco: Never Used  . Alcohol use No  . Drug use: No  . Sexual activity: No   Other Topics Concern  . Not on file   Social History Narrative  . No narrative on file    Family History  Problem Relation Age of Onset  . Hypertension Mother   . Stroke Mother   . Depression Mother   . Alzheimer's disease Mother   . Heart disease Father   . Hypertension Sister     Allergies  Allergen Reactions  . Neosporin [Neomycin-Polymyxin-Gramicidin] Other (See Comments)  blisters  . Tape Swelling    Itching and redness Pt said severe reaction to surgical tape.? latex    Medication list reviewed and updated in full in [facility].  GEN: No fevers, chills. Nontoxic. Primarily MSK c/o today. MSK: Detailed in the HPI GI: tolerating PO intake without difficulty Neuro: No numbness, parasthesias, or tingling associated. Otherwise the pertinent positives of the ROS are noted above.   Objective:   BP 132/77   Pulse 60   Temp 98.6 F (37 C) (Oral)   Ht 5' 7.75" (1.721 m)   Wt 245 lb 4 oz (111.2 kg)   BMI 37.57 kg/m    GEN: WDWN, NAD, Non-toxic,  Alert & Oriented x 3 HEENT: Atraumatic, Normocephalic.  Ears and Nose: No external deformity. EXTR: No clubbing/cyanosis/edema NEURO: Normal gait.  PSYCH: Normally interactive. Conversant. Not depressed or anxious appearing.  Calm demeanor.   Shoulder: R Inspection: No muscle wasting or winging Ecchymosis/edema: neg  AC joint, scapula, clavicle: NT Cervical spine: NT, full ROM Spurling's: neg Abduction: full, 5/5 Flexion: full, 5/5 IR, full, lift-off: 5/5 ER at neutral: full, 5/5 AC crossover: neg Neer: pos Hawkins: pos Drop Test: neg Empty Can: pos Supraspinatus insertion: mild-mod T Bicipital groove: NT Speed's: neg Yergason's: neg Sulcus sign: neg Scapular dyskinesis: none C5-T1 intact  Neuro: Sensation intact Grip 5/5   HIP EXAM: SIDE: R ROM: Abduction, Flexion, Internal and External range of motion: full Pain with terminal IROM and EROM: mild GTB: NT SLR: NEG Knees: No effusion FABER: NT REVERSE FABER: NT, neg Piriformis: NT at direct palpation Str: flexion: 5/5 abduction: 5/5 adduction: 5/5 Strength testing non-tender Some pain in the posterior at insertion just above femur / glute medius region     Radiology: Dg Shoulder Right  Result Date: 09/03/2015 CLINICAL DATA:  Right shoulder pain. EXAM: RIGHT SHOULDER - 2+ VIEW COMPARISON:  No recent. FINDINGS: Acromioclavicular and glenohumeral degenerative change. No evidence of fracture or dislocation. No acute abnormality . IMPRESSION: Degenerative changes right shoulder.  No acute abnormality. Electronically Signed   By: Marcello Moores  Register   On: 09/03/2015 07:53   Dg Hip Unilat With Pelvis 2-3 Views Right  Result Date: 09/03/2015 CLINICAL DATA:  Right hip pain, no history of trauma EXAM: DG HIP (WITH OR WITHOUT PELVIS) 2-3V RIGHT COMPARISON:  Limited views of the right hip from a lumbar spine series of October 11, 2012 FINDINGS: The bony pelvis is subjectively adequately mineralized. There is no lytic or  blastic lesion. AP and lateral views of the right hip reveal preservation of the joint space. The articular surfaces of the femoral head and acetabulum remain smoothly rounded. The femoral neck, intertrochanteric, and subtrochanteric regions are normal. IMPRESSION: There is no acute or significant chronic bony abnormality of the right hip. Electronically Signed   By: David  Martinique M.D.   On: 09/03/2015 07:49    Assessment and Plan:   Impingement syndrome of right shoulder  Right hip pain - Plan: DG HIP UNILAT WITH PELVIS 2-3 VIEWS RIGHT  Right shoulder pain - Plan: DG Shoulder Right, methylPREDNISolone acetate (DEPO-MEDROL) injection 80 mg  Tendinopathy of right gluteus medius  A rehabilitation program from the [employer] was reviewed with the patient face to face for their condition.   Inj shoulder and rehab.  Rehab buttocks for now.  PT if needed  [facility] Injection Verbal consent was obtained from the patient. Risks (including rare infection), benefits, and alternatives were explained. Patient prepped with Chloraprep and  Ethyl Chloride used for anesthesia. The subacromial space was injected using the posterior approach. The patient tolerated the procedure well and had decreased pain post injection. No complications. Injection: 8 cc of Lidocaine 1% and 2 mL of Depo-Medrol 40 mg. Needle: 22 gauge   Follow-up: 8 week  Orders Placed This Encounter  Procedures  . DG HIP UNILAT WITH PELVIS 2-3 VIEWS RIGHT  . DG Shoulder Right    Signed,  Frederico Hamman T. Raymonde Hamblin, MD   Patient's Medications  New Prescriptions   No medications on file  Previous Medications   ACYCLOVIR (ZOVIRAX) 800 MG TABLET    TAKE 1 TABLET BY MOUTH 3 TIMES DAILY FOR 3 DAYS FLARES   ESOMEPRAZOLE (NEXIUM) 40 MG CAPSULE    TAKE ONE CAPSULE BY MOUTH EVERY MORNING BEFORE BREAKFAST   ESTRADIOL (VIVELLE-DOT) 0.0375 MG/24HR    PLACE 1 PATCH ONTO THE SKIN ONCE WEEKLY   GLUCOSAMINE-CHONDROITIN  750-600 MG TABS    Take 1 tablet by mouth 2 (two) times daily.     KLOR-CON M20 20 MEQ TABLET    TAKE 1 TABLET (20 MEQ TOTAL) BY MOUTH ONCE.   LISINOPRIL (PRINIVIL,ZESTRIL) 10 MG TABLET    Take 1 tablet (10 mg total) by mouth daily.   MONTELUKAST (SINGULAIR) 10 MG TABLET    Take 10 mg by mouth daily.    MULTIPLE VITAMIN (MULTIVITAMIN) TABLET    Take 1 tablet by mouth daily.     MUPIROCIN OINTMENT (BACTROBAN) 2 %    Apply 1 application topically daily as needed.   OLOPATADINE HCL (PATANASE NA)    Place 2 sprays into the nose 2 (two) times daily.   PROAIR HFA 108 (90 BASE) MCG/ACT INHALER    USE 2 PUFFS EVERY 4 HOURS AS NEEDED FOR WHEEZING   QVAR 80 MCG/ACT INHALER    Inhale 2 puffs into the lungs 2 (two) times daily.    SYNTHROID 100 MCG TABLET    TAKE 1 TABLET EVERY DAY BY MOUTH   TIZANIDINE (ZANAFLEX) 4 MG TABLET    Take 1 tablet (4 mg total) by mouth every 6 (six) hours as needed.   TRAMADOL (ULTRAM) 50 MG TABLET    Take 2 tablets (100 mg total) by mouth every 8 (eight) hours as needed.   TRIAMTERENE-HYDROCHLOROTHIAZIDE (MAXZIDE-25) 37.5-25 MG TABLET    Take 2 tablets by mouth daily.   VERAPAMIL (CALAN-SR) 240 MG CR TABLET    TAKE 1 TABLET (240 MG TOTAL) BY MOUTH 2 (TWO) TIMES DAILY.  Modified Medications   No medications on file  Discontinued Medications   No medications on file

## 2015-09-02 NOTE — Progress Notes (Signed)
Pre visit review using our clinic review tool, if applicable. No additional management support is needed unless otherwise documented below in the visit note. 

## 2015-09-02 NOTE — Telephone Encounter (Signed)
PA faxed

## 2015-09-10 NOTE — Telephone Encounter (Signed)
Received a fax saying this is a duplicate PA request and they have already approved another one, nothing further needs to be done

## 2015-09-13 ENCOUNTER — Ambulatory Visit (INDEPENDENT_AMBULATORY_CARE_PROVIDER_SITE_OTHER): Payer: Medicare Other | Admitting: Podiatry

## 2015-09-13 DIAGNOSIS — B351 Tinea unguium: Secondary | ICD-10-CM

## 2015-09-13 DIAGNOSIS — M79676 Pain in unspecified toe(s): Secondary | ICD-10-CM | POA: Diagnosis not present

## 2015-09-13 NOTE — Progress Notes (Signed)
She presents today with a chief complaint of painful elongated toenails.  Objective: Vital signs are stable she is alert and oriented 3. Pulses are strongly palpable. 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  Assessment: Pain limiting out the mycosis.  Plan: Toenails 1 through 5 bilateral.

## 2015-09-16 ENCOUNTER — Other Ambulatory Visit: Payer: Self-pay | Admitting: Family Medicine

## 2015-10-01 ENCOUNTER — Encounter: Payer: Self-pay | Admitting: Family Medicine

## 2015-10-01 ENCOUNTER — Ambulatory Visit (INDEPENDENT_AMBULATORY_CARE_PROVIDER_SITE_OTHER): Payer: Medicare Other | Admitting: Family Medicine

## 2015-10-01 VITALS — BP 121/65 | HR 57 | Temp 97.7°F | Ht 67.75 in | Wt 244.0 lb

## 2015-10-01 DIAGNOSIS — I10 Essential (primary) hypertension: Secondary | ICD-10-CM

## 2015-10-01 DIAGNOSIS — R739 Hyperglycemia, unspecified: Secondary | ICD-10-CM | POA: Diagnosis not present

## 2015-10-01 LAB — BASIC METABOLIC PANEL
BUN: 33 mg/dL — ABNORMAL HIGH (ref 6–23)
CALCIUM: 9.3 mg/dL (ref 8.4–10.5)
CO2: 31 mEq/L (ref 19–32)
Chloride: 102 mEq/L (ref 96–112)
Creatinine, Ser: 1.35 mg/dL — ABNORMAL HIGH (ref 0.40–1.20)
GFR: 41.12 mL/min — AB (ref >60.00)
GLUCOSE: 79 mg/dL (ref 70–99)
POTASSIUM: 5 meq/L (ref 3.5–5.1)
SODIUM: 138 meq/L (ref 135–145)

## 2015-10-01 LAB — HEMOGLOBIN A1C: HEMOGLOBIN A1C: 5.8 % (ref 4.6–6.5)

## 2015-10-01 NOTE — Progress Notes (Signed)
Pre visit review using our clinic review tool, if applicable. No additional management support is needed unless otherwise documented below in the visit note. 

## 2015-10-01 NOTE — Progress Notes (Signed)
Subjective:    Patient ID: Amber Miles, female    DOB: Jul 23, 1945, 70 y.o.   MRN: [id number]  HPI Here for f/u of HTN   Trying to be good  Wt Readings from Last 3 Encounters:  10/01/15 244 lb (110.7 kg)  09/02/15 245 lb 4 oz (111.2 kg)  08/27/15 242 lb 8 oz (110 kg)      bp is stable today  No cp or palpitations or headaches or edema  No side effects to medicines  BP Readings from Last 3 Encounters:  10/01/15 128/72  09/02/15 132/77  08/27/15 (!) 144/70     Last visit we added lisinopril  Taking 10 mg  Actually helping her swelling also  No dizziness or low bp   Checking bp at home- somewhat labile  Often in one teens to 120s /50s-80s  occ higher- 130s/70s      Chemistry      Component Value Date/Time   NA 140 06/15/2015 0930   K 3.7 06/15/2015 0930   CL 103 06/15/2015 0930   CO2 30 06/15/2015 0930   BUN 18 06/15/2015 0930   CREATININE 0.90 08/19/2015 1053   CREATININE 1.05 08/26/2012 1134      Component Value Date/Time   CALCIUM 9.7 06/15/2015 0930   ALKPHOS 75 06/15/2015 0930   AST 22 06/15/2015 0930   ALT 23 06/15/2015 0930   BILITOT 0.4 06/15/2015 0930      Lab Results  Component Value Date   CHOL 182 06/15/2015   HDL 41.60 06/15/2015   LDLCALC 103 (H) 05/15/2014   LDLDIRECT 105.0 06/15/2015   TRIG 209.0 (H) 06/15/2015   CHOLHDL 4 06/15/2015     Patient Active Problem List   Diagnosis Date Noted  . Atrophic vaginitis 09/01/2015  . Pedal edema 05/27/2014  . Estrogen deficiency 02/21/2013  . Encounter for Medicare annual wellness exam 02/21/2013  . Low back pain potentially associated with radiculopathy 10/11/2012  . 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 ALLERGIC RHINITIS 01/28/2010  . Hyperglycemia 07/30/2009  . ANXIETY 11/30/2008  . Hyperlipidemia 04/13/2008  . Hypothyroidism 01/07/2008  . Obesity 01/07/2008  . HYPERTENSION, BENIGN 01/07/2008  . 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  Allergy    allergic rhinitis  . Anxiety   . Arthritis    OA knee replacement  . GERD (gastroesophageal reflux disease)   . Hyperlipidemia   . Hypertension   . Hypothyroid   . Reactive airways dysfunction syndrome   . Skin lesions, generalized    squamous cell skin lesions and basal cell skin lesions   Past Surgical History:  Procedure Laterality Date  . ABDOMINAL HYSTERECTOMY  1990s   hyst with oophrectomy for B9lesion/cyst and prolapse  . JOINT REPLACEMENT  05/2009   left total knee replacement  . recocele     and cystocele repair  . tsa     Social History  Substance Use Topics  . Smoking status: Never Smoker  . Smokeless tobacco: Never Used  . Alcohol use No   Family History  Problem Relation Age of Onset  . Hypertension Mother   . Stroke Mother   . Depression Mother   . Alzheimer's disease Mother   . Heart disease Father   . Hypertension Sister    Allergies  Allergen Reactions  . Neosporin [Neomycin-Polymyxin-Gramicidin] Other (See Comments)    blisters  . Tape Swelling    Itching and redness Pt said severe reaction to surgical tape.? latex  Current Outpatient Prescriptions on File Prior to Visit  Medication Sig Dispense Refill  . acyclovir (ZOVIRAX) 800 MG tablet TAKE 1 TABLET BY MOUTH 3 TIMES DAILY FOR 3 DAYS FLARES  2  . esomeprazole (NEXIUM) 40 MG capsule TAKE ONE CAPSULE BY MOUTH EVERY MORNING BEFORE BREAKFAST 90 capsule 1  . estradiol (VIVELLE-DOT) 0.0375 MG/24HR PLACE 1 PATCH ONTO THE SKIN ONCE WEEKLY 12 patch 3  . Glucosamine-Chondroitin 750-600 MG TABS Take 1 tablet by mouth 2 (two) times daily.      Marland Kitchen KLOR-CON M20 20 MEQ tablet TAKE 1 TABLET (20 MEQ TOTAL) BY MOUTH ONCE. 90 tablet 1  . lisinopril (PRINIVIL,ZESTRIL) 10 MG tablet Take 1 tablet (10 mg total) by mouth daily. 30 tablet 11  . montelukast (SINGULAIR) 10 MG tablet Take 10 mg by mouth daily.     . Multiple Vitamin (MULTIVITAMIN) tablet Take 1 tablet by mouth daily.      . mupirocin ointment  (BACTROBAN) 2 % Apply 1 application topically daily as needed. 22 g 1  . Olopatadine HCl (PATANASE NA) Place 2 sprays into the nose 2 (two) times daily.    Marland Kitchen PROAIR HFA 108 (90 BASE) MCG/ACT inhaler USE 2 PUFFS EVERY 4 HOURS AS NEEDED FOR WHEEZING 8.5 each 4  . QVAR 80 MCG/ACT inhaler Inhale 2 puffs into the lungs 2 (two) times daily.     Marland Kitchen SYNTHROID 100 MCG tablet TAKE 1 TABLET EVERY DAY BY MOUTH  11  . tiZANidine (ZANAFLEX) 4 MG tablet Take 1 tablet (4 mg total) by mouth every 6 (six) hours as needed. 60 tablet 1  . traMADol (ULTRAM) 50 MG tablet Take 2 tablets (100 mg total) by mouth every 8 (eight) hours as needed. 90 tablet 1  . triamterene-hydrochlorothiazide (MAXZIDE-25) 37.5-25 MG tablet Take 2 tablets by mouth daily. 180 tablet 2  . verapamil (CALAN-SR) 240 MG CR tablet TAKE 1 TABLET (240 MG TOTAL) BY MOUTH 2 (TWO) TIMES DAILY. 180 tablet 1   No current facility-administered medications on file prior to visit.     Review of Systems Review of Systems  Constitutional: Negative for fever, appetite change, fatigue and unexpected weight change.  Eyes: Negative for pain and visual disturbance.  Respiratory: Negative for cough and shortness of breath.   Cardiovascular: Negative for cp or palpitations    Gastrointestinal: Negative for nausea, diarrhea and constipation.  Genitourinary: Negative for urgency and frequency.  Skin: Negative for pallor or rash   Neurological: Negative for weakness, light-headedness, numbness and headaches.  Hematological: Negative for adenopathy. Does not bruise/bleed easily.  Psychiatric/Behavioral: Negative for dysphoric mood. The patient is not nervous/anxious.         Objective:   Physical Exam  Constitutional: She appears well-developed and well-nourished. No distress.  obese and well appearing   HENT:  Head: Normocephalic and atraumatic.  Mouth/Throat: Oropharynx is clear and moist.  Eyes: Conjunctivae and EOM are normal. Pupils are equal, round,  and reactive to light.  Neck: Normal range of motion. Neck supple. No JVD present. Carotid bruit is not present. No thyromegaly present.  Cardiovascular: Normal rate, regular rhythm, normal heart sounds and intact distal pulses.  Exam reveals no gallop.   Pulmonary/Chest: Effort normal and breath sounds normal. No respiratory distress. She has no wheezes. She has no rales.  No crackles  Abdominal: Soft. Bowel sounds are normal. She exhibits no distension, no abdominal bruit and no mass. There is no tenderness.  Musculoskeletal: She exhibits no edema.  Lymphadenopathy:  She has no cervical adenopathy.  Neurological: She is alert. She has normal reflexes.  Skin: Skin is warm and dry. No rash noted.  Psychiatric: She has a normal mood and affect.          Assessment & Plan:   Problem List Items Addressed This Visit      Cardiovascular and Mediastinum   HYPERTENSION, BENIGN - Primary    bp in fair control at this time  BP Readings from Last 1 Encounters:  10/01/15 121/65   No changes needed Disc lifstyle change with low sodium diet and exercise  Better with addn of lisinopril  Disc DASH diet and enc wt loss  Lab today      Relevant Orders   Basic metabolic panel (Completed)     Other   Hyperglycemia   Relevant Orders   Hemoglobin A1c (Completed)    Other Visit Diagnoses   None.

## 2015-10-01 NOTE — Patient Instructions (Signed)
Blood pressure is improved Continue current medicines  Lab today  Keep working on healthy diet and weight loss

## 2015-10-03 NOTE — Assessment & Plan Note (Signed)
bp in fair control at this time  BP Readings from Last 1 Encounters:  10/01/15 121/65   No changes needed Disc lifstyle change with low sodium diet and exercise  Better with addn of lisinopril  Disc DASH diet and enc wt loss  Lab today

## 2015-10-07 DIAGNOSIS — J300 Vasomotor rhinitis: Secondary | ICD-10-CM | POA: Diagnosis not present

## 2015-10-07 DIAGNOSIS — H1045 Other chronic allergic conjunctivitis: Secondary | ICD-10-CM | POA: Diagnosis not present

## 2015-10-07 DIAGNOSIS — J4530 Mild persistent asthma, uncomplicated: Secondary | ICD-10-CM | POA: Diagnosis not present

## 2015-10-12 ENCOUNTER — Other Ambulatory Visit (INDEPENDENT_AMBULATORY_CARE_PROVIDER_SITE_OTHER): Payer: Medicare Other

## 2015-10-12 ENCOUNTER — Other Ambulatory Visit: Payer: Self-pay | Admitting: Family Medicine

## 2015-10-12 DIAGNOSIS — Z1231 Encounter for screening mammogram for malignant neoplasm of breast: Secondary | ICD-10-CM

## 2015-10-12 DIAGNOSIS — I10 Essential (primary) hypertension: Secondary | ICD-10-CM

## 2015-10-12 LAB — BASIC METABOLIC PANEL WITH GFR
BUN: 24 mg/dL — ABNORMAL HIGH (ref 6–23)
CO2: 32 meq/L (ref 19–32)
Calcium: 9.3 mg/dL (ref 8.4–10.5)
Chloride: 104 meq/L (ref 96–112)
Creatinine, Ser: 1.32 mg/dL — ABNORMAL HIGH (ref 0.40–1.20)
GFR: 42.19 mL/min — ABNORMAL LOW
Glucose, Bld: 71 mg/dL (ref 70–99)
Potassium: 3.9 meq/L (ref 3.5–5.1)
Sodium: 140 meq/L (ref 135–145)

## 2015-10-13 ENCOUNTER — Encounter: Payer: Self-pay | Admitting: *Deleted

## 2015-10-14 ENCOUNTER — Ambulatory Visit (INDEPENDENT_AMBULATORY_CARE_PROVIDER_SITE_OTHER): Payer: Medicare Other | Admitting: Family Medicine

## 2015-10-14 ENCOUNTER — Encounter: Payer: Self-pay | Admitting: Family Medicine

## 2015-10-14 VITALS — BP 140/70 | HR 68 | Temp 98.3°F | Ht 67.75 in | Wt 244.5 lb

## 2015-10-14 DIAGNOSIS — G2589 Other specified extrapyramidal and movement disorders: Secondary | ICD-10-CM

## 2015-10-14 DIAGNOSIS — M898X1 Other specified disorders of bone, shoulder: Secondary | ICD-10-CM | POA: Diagnosis not present

## 2015-10-14 MED ORDER — LISINOPRIL 5 MG PO TABS: 5.0000 mg | ORAL_TABLET | Freq: Every day | ORAL | 3 refills | Status: DC

## 2015-10-14 NOTE — Progress Notes (Signed)
Pre visit review using our clinic review tool, if applicable. No additional management support is needed unless otherwise documented below in the visit note. 

## 2015-10-14 NOTE — Progress Notes (Signed)
Dr. Frederico Hamman T. Faiga Stones, MD, [facility] [place], [address] Phone: [phone] Fax: [phone]  10/14/2015  Patient: Amber Miles, MRN: [id number], DOB: Jul 17, 1945, 70 y.o.  Primary Physician:  Loura Pardon, MD   Chief Complaint  Patient presents with  . Follow-up    Shoulder/Hip   Subjective:   [name] is a 70 y.o. very pleasant female patient who presents with the following:  Hip and shoulder are doing better. She is no longer having any any impingement or any pain at the hip.   Shoulder blades are bothering her  Notable poor posture.  Scapular   09/02/2015 Last OV with Owens Loffler, MD  The patient noted above presents with shoulder pain that has been ongoing for several months.  there is no history of trauma or accident. The patient denies neck pain or radicular symptoms. Denies dislocation, subluxation, separation of the shoulder. The patient does not complain of pain in the overhead plane.  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   R  Hip pain. Hurts with sleeping on it. Down her back. Has been on celebrex and had some swelling. More in the posterior of the buttocks in a specific location.  Took 2 zanaflex.   Past Medical History, Surgical History, Social History, Family History, Problem List, Medications, and Allergies have been reviewed and updated if relevant.  Patient Active Problem List   Diagnosis Date Noted  . Atrophic vaginitis 09/01/2015  . Pedal edema 05/27/2014  . Estrogen deficiency 02/21/2013  . Encounter for Medicare annual wellness exam 02/21/2013  . Low back pain potentially associated with radiculopathy 10/11/2012  . 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 ALLERGIC RHINITIS 01/28/2010  . Hyperglycemia 07/30/2009  .  ANXIETY 11/30/2008  . Hyperlipidemia 04/13/2008  . Hypothyroidism 01/07/2008  . Obesity 01/07/2008  . HYPERTENSION, BENIGN 01/07/2008  . 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 Allergy    allergic rhinitis  . Anxiety   . Arthritis    OA knee replacement  . GERD (gastroesophageal reflux disease)   . Hyperlipidemia   . Hypertension   . Hypothyroid   . Reactive airways dysfunction syndrome   . Skin lesions, generalized    squamous cell skin lesions and basal cell skin lesions    Past Surgical History:  Procedure Laterality Date  . ABDOMINAL HYSTERECTOMY  1990s   hyst with oophrectomy for B9lesion/cyst and prolapse  . JOINT REPLACEMENT  05/2009   left total knee replacement  . recocele     and cystocele repair  . tsa      Social History   Social History  . Marital status: Widowed    Spouse name: N/A  . Number of children: N/A  . Years of education: N/A   Occupational History  . Not on file.   Social History Main Topics  . Smoking status: Never Smoker  . Smokeless tobacco: Never Used  . Alcohol use No  . Drug use: No  . Sexual activity: No   Other Topics Concern  . Not on file   Social History Narrative  . No narrative on file    Family History  Problem Relation Age of Onset  . Hypertension Mother   . Stroke Mother   . Depression Mother   .  Alzheimer's disease Mother   . Heart disease Father   . Hypertension Sister     Allergies  Allergen Reactions  . Neosporin [Neomycin-Polymyxin-Gramicidin] Other (See Comments)    blisters  . Tape Swelling    Itching and redness Pt said severe reaction to surgical tape.? latex    Medication list reviewed and updated in full in [place].  GEN: No fevers, chills. Nontoxic. Primarily MSK c/o today. MSK: Detailed in the HPI GI: tolerating PO intake without difficulty Neuro: No numbness, parasthesias, or tingling associated. Otherwise the pertinent positives  of the ROS are noted above.   Objective:   BP 140/70   Pulse 68   Temp 98.3 F (36.8 C) (Oral)   Ht 5' 7.75" (1.721 m)   Wt 244 lb 8 oz (110.9 kg)   BMI 37.45 kg/m    GEN: WDWN, NAD, Non-toxic, Alert & Oriented x 3 HEENT: Atraumatic, Normocephalic.  Ears and Nose: No external deformity. EXTR: No clubbing/cyanosis/edema NEURO: Normal gait.  PSYCH: Normally interactive. Conversant. Not depressed or anxious appearing.  Calm demeanor.   Shoulder: B Inspection: No muscle wasting or winging Ecchymosis/edema: neg  AC joint, scapula, clavicle: NT Cervical spine: NT, full ROM Spurling's: neg Abduction: full, 5/5 Flexion: full, 5/5 IR, full, lift-off: 5/5 ER at neutral: full, 5/5 AC crossover and compression: neg Neer: neg Hawkins: neg Drop Test: neg Empty Can: neg Supraspinatus insertion: NT Bicipital groove: NT Speed's: neg Yergason's: neg Sulcus sign: neg Scapular dyskinesis: + With forward sloping scapula's C5-T1 intact Sensation intact Grip 5/5   Assessment and Plan:   Shoulder blade pain - Plan: Ambulatory referral to Physical Therapy  Scapular dyskinesis - Plan: Ambulatory referral to Physical Therapy  Doing very well. Gave home rehabilitation from [place], and also to have her visit physical therapy a few times. Primarily scapular stabilization at this point.  Follow-up: prn  New Prescriptions   No medications on file   Modified Medications   Modified Medication Previous Medication   LISINOPRIL (PRINIVIL,ZESTRIL) 5 MG TABLET lisinopril (PRINIVIL,ZESTRIL) 10 MG tablet      Take 1 tablet (5 mg total) by mouth daily.    Take 1 tablet (10 mg total) by mouth daily.   Orders Placed This Encounter  Procedures  . Ambulatory referral to Physical Therapy    Signed,  Frederico Hamman T. Sophi Calligan, MD   Patient's Medications  New Prescriptions   No medications on file  Previous Medications   ACYCLOVIR (ZOVIRAX) 800 MG TABLET    TAKE 1 TABLET BY MOUTH 3 TIMES  DAILY FOR 3 DAYS FLARES   ESOMEPRAZOLE (NEXIUM) 40 MG CAPSULE    TAKE ONE CAPSULE BY MOUTH EVERY MORNING BEFORE BREAKFAST   ESTRADIOL (VIVELLE-DOT) 0.0375 MG/24HR    PLACE 1 PATCH ONTO THE SKIN ONCE WEEKLY   GLUCOSAMINE-CHONDROITIN 750-600 MG TABS    Take 1 tablet by mouth 2 (two) times daily.     KLOR-CON M20 20 MEQ TABLET    TAKE 1 TABLET (20 MEQ TOTAL) BY MOUTH ONCE.   MONTELUKAST (SINGULAIR) 10 MG TABLET    Take 10 mg by mouth daily.    MULTIPLE VITAMIN (MULTIVITAMIN) TABLET    Take 1 tablet by mouth daily.     MUPIROCIN OINTMENT (BACTROBAN) 2 %    Apply 1 application topically daily as needed.   OLOPATADINE HCL (PATANASE NA)    Place 2 sprays into the nose 2 (two) times daily.   PROAIR HFA 108 (90 BASE) MCG/ACT INHALER    USE  2 PUFFS EVERY 4 HOURS AS NEEDED FOR WHEEZING   QVAR 80 MCG/ACT INHALER    Inhale 2 puffs into the lungs 2 (two) times daily.    SYNTHROID 100 MCG TABLET    TAKE 1 TABLET EVERY DAY BY MOUTH   TIZANIDINE (ZANAFLEX) 4 MG TABLET    Take 1 tablet (4 mg total) by mouth every 6 (six) hours as needed.   TRAMADOL (ULTRAM) 50 MG TABLET    Take 2 tablets (100 mg total) by mouth every 8 (eight) hours as needed.   TRIAMTERENE-HYDROCHLOROTHIAZIDE (MAXZIDE-25) 37.5-25 MG TABLET    Take 2 tablets by mouth daily.   VERAPAMIL (CALAN-SR) 240 MG CR TABLET    TAKE 1 TABLET (240 MG TOTAL) BY MOUTH 2 (TWO) TIMES DAILY.  Modified Medications   Modified Medication Previous Medication   LISINOPRIL (PRINIVIL,ZESTRIL) 5 MG TABLET lisinopril (PRINIVIL,ZESTRIL) 10 MG tablet      Take 1 tablet (5 mg total) by mouth daily.    Take 1 tablet (10 mg total) by mouth daily.  Discontinued Medications   No medications on file

## 2015-10-21 DIAGNOSIS — M546 Pain in thoracic spine: Secondary | ICD-10-CM | POA: Diagnosis not present

## 2015-10-25 DIAGNOSIS — M546 Pain in thoracic spine: Secondary | ICD-10-CM | POA: Diagnosis not present

## 2015-10-28 DIAGNOSIS — M546 Pain in thoracic spine: Secondary | ICD-10-CM | POA: Diagnosis not present

## 2015-11-01 DIAGNOSIS — M546 Pain in thoracic spine: Secondary | ICD-10-CM | POA: Diagnosis not present

## 2015-11-04 DIAGNOSIS — M546 Pain in thoracic spine: Secondary | ICD-10-CM | POA: Diagnosis not present

## 2015-11-08 ENCOUNTER — Ambulatory Visit
Admission: RE | Admit: 2015-11-08 | Discharge: 2015-11-08 | Disposition: A | Discharge status: Home / Self Care | Payer: Medicare Other | Source: Ambulatory Visit | Attending: Family Medicine | Admitting: Family Medicine

## 2015-11-08 DIAGNOSIS — Z1231 Encounter for screening mammogram for malignant neoplasm of breast: Secondary | ICD-10-CM

## 2015-11-08 DIAGNOSIS — M546 Pain in thoracic spine: Secondary | ICD-10-CM | POA: Diagnosis not present

## 2015-11-08 LAB — HM MAMMOGRAPHY

## 2015-11-10 ENCOUNTER — Encounter: Payer: Self-pay | Admitting: *Deleted

## 2015-11-11 DIAGNOSIS — Z23 Encounter for immunization: Secondary | ICD-10-CM | POA: Diagnosis not present

## 2015-11-11 DIAGNOSIS — M546 Pain in thoracic spine: Secondary | ICD-10-CM | POA: Diagnosis not present

## 2015-11-15 ENCOUNTER — Encounter: Payer: Self-pay | Admitting: Family Medicine

## 2015-11-15 DIAGNOSIS — D485 Neoplasm of uncertain behavior of skin: Secondary | ICD-10-CM | POA: Diagnosis not present

## 2015-11-15 DIAGNOSIS — M546 Pain in thoracic spine: Secondary | ICD-10-CM | POA: Diagnosis not present

## 2015-11-15 DIAGNOSIS — L570 Actinic keratosis: Secondary | ICD-10-CM | POA: Diagnosis not present

## 2015-11-15 DIAGNOSIS — L821 Other seborrheic keratosis: Secondary | ICD-10-CM | POA: Diagnosis not present

## 2015-11-15 DIAGNOSIS — D692 Other nonthrombocytopenic purpura: Secondary | ICD-10-CM | POA: Diagnosis not present

## 2015-11-15 DIAGNOSIS — Z85828 Personal history of other malignant neoplasm of skin: Secondary | ICD-10-CM | POA: Diagnosis not present

## 2015-11-15 DIAGNOSIS — D2271 Melanocytic nevi of right lower limb, including hip: Secondary | ICD-10-CM | POA: Diagnosis not present

## 2015-11-15 DIAGNOSIS — C44519 Basal cell carcinoma of skin of other part of trunk: Secondary | ICD-10-CM | POA: Diagnosis not present

## 2015-11-15 DIAGNOSIS — D2272 Melanocytic nevi of left lower limb, including hip: Secondary | ICD-10-CM | POA: Diagnosis not present

## 2015-11-16 ENCOUNTER — Ambulatory Visit (INDEPENDENT_AMBULATORY_CARE_PROVIDER_SITE_OTHER): Payer: Medicare Other | Admitting: Podiatry

## 2015-11-16 DIAGNOSIS — M79609 Pain in unspecified limb: Secondary | ICD-10-CM

## 2015-11-16 DIAGNOSIS — L608 Other nail disorders: Secondary | ICD-10-CM

## 2015-11-16 DIAGNOSIS — L603 Nail dystrophy: Secondary | ICD-10-CM

## 2015-11-16 DIAGNOSIS — B351 Tinea unguium: Secondary | ICD-10-CM

## 2015-11-18 DIAGNOSIS — M546 Pain in thoracic spine: Secondary | ICD-10-CM | POA: Diagnosis not present

## 2015-11-21 NOTE — Progress Notes (Signed)

## 2015-12-02 ENCOUNTER — Other Ambulatory Visit: Payer: Self-pay | Admitting: Family Medicine

## 2015-12-14 ENCOUNTER — Ambulatory Visit: Payer: Medicare Other | Admitting: Podiatry

## 2015-12-19 ENCOUNTER — Other Ambulatory Visit: Payer: Self-pay | Admitting: Family Medicine

## 2016-01-02 ENCOUNTER — Other Ambulatory Visit: Payer: Self-pay | Admitting: Family Medicine

## 2016-01-20 ENCOUNTER — Encounter: Payer: Self-pay | Admitting: Podiatry

## 2016-01-20 ENCOUNTER — Ambulatory Visit (INDEPENDENT_AMBULATORY_CARE_PROVIDER_SITE_OTHER): Payer: Medicare Other | Admitting: Podiatry

## 2016-01-20 DIAGNOSIS — B351 Tinea unguium: Secondary | ICD-10-CM

## 2016-01-20 DIAGNOSIS — L603 Nail dystrophy: Secondary | ICD-10-CM

## 2016-01-20 DIAGNOSIS — L608 Other nail disorders: Secondary | ICD-10-CM

## 2016-01-20 DIAGNOSIS — M79609 Pain in unspecified limb: Secondary | ICD-10-CM | POA: Diagnosis not present

## 2016-01-23 NOTE — Progress Notes (Signed)
   SUBJECTIVE Patient  presents to office today complaining of elongated, thickened nails. Pain while ambulating in shoes. Patient is unable to trim their own nails.   OBJECTIVE General Patient is awake, alert, and oriented x 3 and in no acute distress. Derm Skin is dry and supple bilateral. Negative open lesions or macerations. Remaining integument unremarkable. Nails are tender, long, thickened and dystrophic with subungual debris, consistent with onychomycosis, 1-5 bilateral. No signs of infection noted. Vasc  DP and PT pedal pulses palpable bilaterally. Temperature gradient within normal limits.  Neuro Epicritic and protective threshold sensation diminished bilaterally.  Musculoskeletal Exam No symptomatic pedal deformities noted bilateral. Muscular strength within normal limits.  ASSESSMENT 1. Onychodystrophic nails 1-5 bilateral with hyperkeratosis of nails.  2. Onychomycosis of nail due to dermatophyte bilateral 3. Pain in foot bilateral  PLAN OF CARE 1. Patient evaluated today.  2. Instructed to maintain good pedal hygiene and foot care.  3. Mechanical debridement of nails 1-5 bilaterally performed using a nail nipper. Filed with dremel without incident.  4. Return to clinic in 3 mos.    Brent M. Evans, DPM [facility]  Dr. Brent M. Evans, DPM    [address]                                        [place],  [address]                Office [phone]  Fax [phone]      

## 2016-02-01 ENCOUNTER — Telehealth: Payer: Self-pay | Admitting: Podiatry

## 2016-02-01 NOTE — Telephone Encounter (Signed)
Pt got a bill for $8.70. This needs to be re filed her secondary insurance wasn't filed.

## 2016-03-09 ENCOUNTER — Other Ambulatory Visit: Payer: Self-pay | Admitting: Family Medicine

## 2016-03-23 ENCOUNTER — Ambulatory Visit: Payer: Medicare Other | Admitting: Podiatry

## 2016-03-28 ENCOUNTER — Ambulatory Visit (INDEPENDENT_AMBULATORY_CARE_PROVIDER_SITE_OTHER): Payer: Medicare Other | Admitting: Podiatry

## 2016-03-28 DIAGNOSIS — B351 Tinea unguium: Secondary | ICD-10-CM | POA: Diagnosis not present

## 2016-03-28 DIAGNOSIS — M79609 Pain in unspecified limb: Secondary | ICD-10-CM

## 2016-03-28 DIAGNOSIS — L608 Other nail disorders: Secondary | ICD-10-CM | POA: Diagnosis not present

## 2016-03-28 DIAGNOSIS — L603 Nail dystrophy: Secondary | ICD-10-CM

## 2016-03-28 NOTE — Progress Notes (Signed)
   SUBJECTIVE Patient  presents to office today complaining of elongated, thickened nails. Pain while ambulating in shoes. Patient is unable to trim their own nails.   OBJECTIVE General Patient is awake, alert, and oriented x 3 and in no acute distress. Derm Skin is dry and supple bilateral. Negative open lesions or macerations. Remaining integument unremarkable. Nails are tender, long, thickened and dystrophic with subungual debris, consistent with onychomycosis, 1-5 bilateral. No signs of infection noted. Vasc  DP and PT pedal pulses palpable bilaterally. Temperature gradient within normal limits.  Neuro Epicritic and protective threshold sensation diminished bilaterally.  Musculoskeletal Exam No symptomatic pedal deformities noted bilateral. Muscular strength within normal limits.  ASSESSMENT 1. Onychodystrophic nails 1-5 bilateral with hyperkeratosis of nails.  2. Onychomycosis of nail due to dermatophyte bilateral 3. Pain in foot bilateral  PLAN OF CARE 1. Patient evaluated today.  2. Instructed to maintain good pedal hygiene and foot care.  3. Mechanical debridement of nails 1-5 bilaterally performed using a nail nipper. Filed with dremel without incident.  4. Return to clinic in 3 mos.   Next visit we will perform partial permanent nail avulsion right great toe lateral border   Edrick Kins, DPM [facility]  Dr. Edrick Kins, [address]                                        [place], [place] [address]                Office [phone]  Fax [phone]

## 2016-03-30 DIAGNOSIS — J4520 Mild intermittent asthma, uncomplicated: Secondary | ICD-10-CM | POA: Diagnosis not present

## 2016-03-30 DIAGNOSIS — N958 Other specified menopausal and perimenopausal disorders: Secondary | ICD-10-CM | POA: Diagnosis not present

## 2016-03-30 DIAGNOSIS — E042 Nontoxic multinodular goiter: Secondary | ICD-10-CM | POA: Diagnosis not present

## 2016-03-30 DIAGNOSIS — D34 Benign neoplasm of thyroid gland: Secondary | ICD-10-CM | POA: Diagnosis not present

## 2016-03-30 DIAGNOSIS — E0521 Thyrotoxicosis with toxic multinodular goiter with thyrotoxic crisis or storm: Secondary | ICD-10-CM | POA: Diagnosis not present

## 2016-03-30 DIAGNOSIS — K229 Disease of esophagus, unspecified: Secondary | ICD-10-CM | POA: Diagnosis not present

## 2016-03-30 DIAGNOSIS — I10 Essential (primary) hypertension: Secondary | ICD-10-CM | POA: Diagnosis not present

## 2016-03-30 DIAGNOSIS — E890 Postprocedural hypothyroidism: Secondary | ICD-10-CM | POA: Diagnosis not present

## 2016-04-06 DIAGNOSIS — N958 Other specified menopausal and perimenopausal disorders: Secondary | ICD-10-CM | POA: Diagnosis not present

## 2016-04-06 DIAGNOSIS — J4520 Mild intermittent asthma, uncomplicated: Secondary | ICD-10-CM | POA: Diagnosis not present

## 2016-04-06 DIAGNOSIS — E042 Nontoxic multinodular goiter: Secondary | ICD-10-CM | POA: Diagnosis not present

## 2016-04-06 DIAGNOSIS — E890 Postprocedural hypothyroidism: Secondary | ICD-10-CM | POA: Diagnosis not present

## 2016-04-06 DIAGNOSIS — D34 Benign neoplasm of thyroid gland: Secondary | ICD-10-CM | POA: Diagnosis not present

## 2016-04-06 DIAGNOSIS — I10 Essential (primary) hypertension: Secondary | ICD-10-CM | POA: Diagnosis not present

## 2016-04-06 DIAGNOSIS — E0521 Thyrotoxicosis with toxic multinodular goiter with thyrotoxic crisis or storm: Secondary | ICD-10-CM | POA: Diagnosis not present

## 2016-04-06 DIAGNOSIS — K229 Disease of esophagus, unspecified: Secondary | ICD-10-CM | POA: Diagnosis not present

## 2016-05-24 DIAGNOSIS — H2513 Age-related nuclear cataract, bilateral: Secondary | ICD-10-CM | POA: Diagnosis not present

## 2016-05-24 DIAGNOSIS — H5203 Hypermetropia, bilateral: Secondary | ICD-10-CM | POA: Diagnosis not present

## 2016-05-24 DIAGNOSIS — H40013 Open angle with borderline findings, low risk, bilateral: Secondary | ICD-10-CM | POA: Diagnosis not present

## 2016-05-30 ENCOUNTER — Ambulatory Visit (INDEPENDENT_AMBULATORY_CARE_PROVIDER_SITE_OTHER): Payer: Medicare Other | Admitting: Podiatry

## 2016-05-30 DIAGNOSIS — L600 Ingrowing nail: Secondary | ICD-10-CM | POA: Diagnosis not present

## 2016-05-31 NOTE — Progress Notes (Signed)
   Subjective: Patient presents today for evaluation of pain to the lateral borders of bilateral great toes. Patient is concerned for possible ingrown nail. Patient states that the pain has been present for a few weeks now. Patient presents today for further treatment and evaluation.  Objective:  General: Well developed, nourished, in no acute distress, alert and oriented x3   Dermatology: Skin is warm, dry and supple bilateral. Lateral borders of bilateral great toes appear to be erythematous with evidence of ingrowing nails. Pain on palpation noted to the border of the nail folds. The remaining nails appear unremarkable at this time. There are no open sores, lesions.  Vascular: Dorsalis Pedis artery and Posterior Tibial artery pedal pulses palpable. No lower extremity edema noted.   Neruologic: Grossly intact via light touch bilateral.  Musculoskeletal: Muscular strength within normal limits in all groups bilateral. Normal range of motion noted to all pedal and ankle joints.   Assesement: #1 Paronychia with ingrowing nail lateral borders of bilateral great toes #2 Pain in toe #3 Incurvated nail  Plan of Care:  1. Patient evaluated.  2. Discussed treatment alternatives and plan of care. Explained nail avulsion procedure and post procedure course to patient. 3. Patient opted for permanent partial nail avulsion of lateral borders of bilateral great toes.  4. Prior to procedure, local anesthesia infiltration utilized using 3 ml of a 50:50 mixture of 2% plain lidocaine and 0.5% plain marcaine in a normal hallux block fashion and a betadine prep performed.  5. Partial permanent nail avulsion with chemical matrixectomy performed using [id number] applications of phenol followed by alcohol flush.  6. Light dressing applied. 7. Return to clinic in 2 weeks.   Edrick Kins, DPM [facility]  Dr. Edrick Kins, [place] [address]                                          [place], [place] [address]                Office [phone]  Fax [phone]

## 2016-06-02 ENCOUNTER — Other Ambulatory Visit: Payer: Self-pay | Admitting: Family Medicine

## 2016-06-06 ENCOUNTER — Other Ambulatory Visit: Payer: Self-pay | Admitting: Family Medicine

## 2016-06-06 NOTE — Telephone Encounter (Signed)
Medication phoned to pharmacy.  

## 2016-06-06 NOTE — Telephone Encounter (Signed)
CPE scheduled for 06/26/16, last filled on 06/18/15 #90 tabs with 1 additional refill, please advise

## 2016-06-06 NOTE — Telephone Encounter (Signed)
Px written for call in   

## 2016-06-13 ENCOUNTER — Ambulatory Visit (INDEPENDENT_AMBULATORY_CARE_PROVIDER_SITE_OTHER): Payer: Medicare Other | Admitting: Podiatry

## 2016-06-13 DIAGNOSIS — S91109D Unspecified open wound of unspecified toe(s) without damage to nail, subsequent encounter: Secondary | ICD-10-CM

## 2016-06-13 DIAGNOSIS — M79676 Pain in unspecified toe(s): Secondary | ICD-10-CM

## 2016-06-13 DIAGNOSIS — S91209D Unspecified open wound of unspecified toe(s) with damage to nail, subsequent encounter: Secondary | ICD-10-CM | POA: Diagnosis not present

## 2016-06-15 NOTE — Progress Notes (Signed)
   Subjective: Patient presents today 2 weeks post ingrown nail permanent nail avulsion procedure of lateral borders of bilateral great toes. Patient states that the toe and nail fold is feeling much better.  Objective: Skin is warm, dry and supple. Nail and respective nail fold appears to be healing appropriately. Open wound to the associated nail fold with a granular wound base and moderate amount of fibrotic tissue. Minimal drainage noted. Mild erythema around the periungual region likely due to phenol chemical matricectomy.  Assessment: #1 postop permanent partial nail avulsion lateral borders of bilateral great toes #2 open wound periungual nail fold of respective digit.   Plan of care: #1 patient was evaluated  #2 debridement of open wound was performed to the periungual border of the respective toe using a currette. Antibiotic ointment and Band-Aid was applied. #3 patient is to return to clinic on a PRN  basis.   Edrick Kins, DPM [facility]  Dr. Edrick Kins, [address]                                        [place], [place] [address]                Office [phone]  Fax [phone]

## 2016-06-18 ENCOUNTER — Telehealth: Payer: Self-pay | Admitting: Family Medicine

## 2016-06-18 DIAGNOSIS — E039 Hypothyroidism, unspecified: Secondary | ICD-10-CM

## 2016-06-18 DIAGNOSIS — R739 Hyperglycemia, unspecified: Secondary | ICD-10-CM

## 2016-06-18 DIAGNOSIS — I10 Essential (primary) hypertension: Secondary | ICD-10-CM

## 2016-06-18 DIAGNOSIS — E7800 Pure hypercholesterolemia, unspecified: Secondary | ICD-10-CM

## 2016-06-18 NOTE — Telephone Encounter (Signed)
-----   Message from Ellamae Sia sent at 06/15/2016  3:22 PM EDT ----- Regarding: Lab orders for Friday, 6.8.18 Patient is scheduled for CPX labs, please order future labs, Thanks , Karna Christmas

## 2016-06-23 ENCOUNTER — Ambulatory Visit (INDEPENDENT_AMBULATORY_CARE_PROVIDER_SITE_OTHER): Payer: Medicare Other

## 2016-06-23 ENCOUNTER — Other Ambulatory Visit: Payer: Medicare Other

## 2016-06-23 VITALS — BP 120/76 | HR 69 | Temp 97.9°F | Ht 67.5 in | Wt 236.5 lb

## 2016-06-23 DIAGNOSIS — I10 Essential (primary) hypertension: Secondary | ICD-10-CM

## 2016-06-23 DIAGNOSIS — E039 Hypothyroidism, unspecified: Secondary | ICD-10-CM | POA: Diagnosis not present

## 2016-06-23 DIAGNOSIS — R739 Hyperglycemia, unspecified: Secondary | ICD-10-CM

## 2016-06-23 DIAGNOSIS — E7800 Pure hypercholesterolemia, unspecified: Secondary | ICD-10-CM

## 2016-06-23 DIAGNOSIS — Z0000 Encounter for general adult medical examination without abnormal findings: Secondary | ICD-10-CM

## 2016-06-23 LAB — CBC WITH DIFFERENTIAL/PLATELET
BASOS ABS: 0.1 10*3/uL (ref 0.0–0.1)
BASOS PCT: 1.7 % (ref 0.0–3.0)
EOS PCT: 5.1 % — AB (ref 0.0–5.0)
Eosinophils Absolute: 0.3 10*3/uL (ref 0.0–0.7)
HEMATOCRIT: 45.1 % (ref 36.0–46.0)
Hemoglobin: 15.1 g/dL — ABNORMAL HIGH (ref 12.0–15.0)
Lymphocytes Relative: 20.8 % (ref 12.0–46.0)
Lymphs Abs: 1.4 10*3/uL (ref 0.7–4.0)
MCHC: 33.5 g/dL (ref 30.0–36.0)
MCV: 90.5 fl (ref 78.0–100.0)
MONOS PCT: 8.2 % (ref 3.0–12.0)
Monocytes Absolute: 0.5 10*3/uL (ref 0.1–1.0)
NEUTROS ABS: 4.3 10*3/uL (ref 1.4–7.7)
Neutrophils Relative %: 64.2 % (ref 43.0–77.0)
PLATELETS: 302 10*3/uL (ref 150.0–400.0)
RBC: 4.98 Mil/uL (ref 3.87–5.11)
RDW: 14.6 % (ref 11.5–15.5)
WBC: 6.7 10*3/uL (ref 4.0–10.5)

## 2016-06-23 LAB — COMPREHENSIVE METABOLIC PANEL
ALK PHOS: 74 U/L (ref 39–117)
ALT: 16 U/L (ref 0–35)
AST: 16 U/L (ref 0–37)
Albumin: 4.1 g/dL (ref 3.5–5.2)
BILIRUBIN TOTAL: 0.5 mg/dL (ref 0.2–1.2)
BUN: 22 mg/dL (ref 6–23)
CALCIUM: 10.2 mg/dL (ref 8.4–10.5)
CO2: 29 meq/L (ref 19–32)
Chloride: 102 mEq/L (ref 96–112)
Creatinine, Ser: 1.15 mg/dL (ref 0.40–1.20)
GFR: 49.37 mL/min — AB (ref >60.00)
Glucose, Bld: 117 mg/dL — ABNORMAL HIGH (ref 70–99)
Potassium: 4 mEq/L (ref 3.5–5.1)
Sodium: 137 mEq/L (ref 135–145)
Total Protein: 7.5 g/dL (ref 6.0–8.3)

## 2016-06-23 LAB — LIPID PANEL
CHOLESTEROL: 180 mg/dL (ref 0–200)
HDL: 42.4 mg/dL (ref >39.00)
LDL Cholesterol: 106 mg/dL — ABNORMAL HIGH (ref 0–99)
NonHDL: 137.13
Total CHOL/HDL Ratio: 4
Triglycerides: 157 mg/dL — ABNORMAL HIGH (ref 0.0–149.0)
VLDL: 31.4 mg/dL (ref 0.0–40.0)

## 2016-06-23 LAB — TSH: TSH: 3.41 u[IU]/mL (ref 0.35–4.50)

## 2016-06-23 LAB — HEMOGLOBIN A1C: Hgb A1c MFr Bld: 5.7 % (ref 4.6–6.5)

## 2016-06-23 NOTE — Progress Notes (Signed)
Subjective:   Amber Miles is a 71 y.o. female who presents for Medicare Annual (Subsequent) preventive examination.  Review of Systems: N/A Cardiac Risk Factors include: advanced age (>42men, >22 women);obesity (BMI >30kg/m2);dyslipidemia;hypertension     Objective:     Vitals: BP 120/76 (BP Location: Right Arm, Patient Position: Sitting, Cuff Size: Normal)   Pulse 69   Temp 97.9 F (36.6 C) (Oral)   Ht 5' 7.5" (1.715 m) Comment: no shoes  Wt 236 lb 8 oz (107.3 kg)   SpO2 96%   BMI 36.49 kg/m   Body mass index is 36.49 kg/m.   Tobacco History  Smoking Status  . Never Smoker  Smokeless Tobacco  . Never Used     Counseling given: No   Past Medical History:  Diagnosis Date  . Allergy    allergic rhinitis  . Anxiety   . Arthritis    OA knee replacement  . GERD (gastroesophageal reflux disease)   . Hyperlipidemia   . Hypertension   . Hypothyroid   . Reactive airways dysfunction syndrome ([place])   . Skin lesions, generalized    squamous cell skin lesions and basal cell skin lesions   Past Surgical History:  Procedure Laterality Date  . ABDOMINAL HYSTERECTOMY  1990s   hyst with oophrectomy for B9lesion/cyst and prolapse  . JOINT REPLACEMENT  05/2009   left total knee replacement  . recocele     and cystocele repair  . tsa     Family History  Problem Relation Age of Onset  . Hypertension Mother   . Stroke Mother   . Depression Mother   . Alzheimer's disease Mother   . Heart disease Father   . Hypertension Sister    History  Sexual Activity  . Sexual activity: No    Outpatient Encounter Prescriptions as of 06/23/2016  Medication Sig  . acyclovir (ZOVIRAX) 800 MG tablet TAKE 1 TABLET BY MOUTH 3 TIMES DAILY FOR 3 DAYS FLARES  . docusate sodium (COLACE) 100 MG capsule Take 100 mg by mouth 2 (two) times daily.  Marland Kitchen esomeprazole (NEXIUM) 40 MG capsule TAKE ONE CAPSULE BY MOUTH EVERY MORNING BEFORE BREAKFAST  . estradiol (VIVELLE-DOT) 0.0375 MG/24HR PLACE 1  PATCH ONTO THE SKIN ONCE WEEKLY  . Glucosamine-Chondroitin 750-600 MG TABS Take 1 tablet by mouth 2 (two) times daily.    Marland Kitchen KLOR-CON M20 20 MEQ tablet TAKE 1 TABLET (20 MEQ TOTAL) BY MOUTH ONCE DAILY  . lisinopril (PRINIVIL,ZESTRIL) 5 MG tablet Take 1 tablet (5 mg total) by mouth daily.  . montelukast (SINGULAIR) 10 MG tablet Take 10 mg by mouth daily.   . Multiple Vitamin (MULTIVITAMIN) tablet Take 1 tablet by mouth daily.    . mupirocin ointment (BACTROBAN) 2 % Apply 1 application topically daily as needed.  . Olopatadine HCl (PATANASE NA) Place 2 sprays into the nose 2 (two) times daily.  Marland Kitchen PROAIR HFA 108 (90 BASE) MCG/ACT inhaler USE 2 PUFFS EVERY 4 HOURS AS NEEDED FOR WHEEZING  . QVAR 80 MCG/ACT inhaler Inhale 2 puffs into the lungs 2 (two) times daily.   Marland Kitchen SYNTHROID 100 MCG tablet TAKE 1 TABLET EVERY DAY BY MOUTH  . tiZANidine (ZANAFLEX) 4 MG tablet Take 1 tablet (4 mg total) by mouth every 6 (six) hours as needed.  . traMADol (ULTRAM) 50 MG tablet TAKE 2 TABLETS BY MOUTH EVERY 8 HOURS AS NEEDED  . triamterene-hydrochlorothiazide (MAXZIDE-25) 37.5-25 MG tablet TAKE 2 TABLETS BY MOUTH DAILY.  . verapamil (CALAN-SR) 240 MG  CR tablet TAKE 1 TABLET (240 MG TOTAL) BY MOUTH 2 (TWO) TIMES DAILY.   No facility-administered encounter medications on file as of 06/23/2016.     Activities of Daily Living In your present state of health, do you have any difficulty performing the following activities: 06/23/2016  Hearing? Y  Vision? N  Difficulty concentrating or making decisions? N  Walking or climbing stairs? N  Dressing or bathing? N  Doing errands, shopping? N  Preparing Food and eating ? N  Using the Toilet? N  In the past six months, have you accidently leaked urine? N  Do you have problems with loss of bowel control? N  Managing your Medications? N  Managing your Finances? N  Housekeeping or managing your Housekeeping? N  Some recent data might be hidden    Patient Care Team: [place],  Wynelle Fanny, MD as PCP - Silvano Rusk, MD as Consulting Physician (Ophthalmology)    Assessment:     Hearing Screening   125Hz  250Hz  500Hz  1000Hz  2000Hz  3000Hz  4000Hz  6000Hz  8000Hz   Right ear:   0 40 0  0    Left ear:   0 0 0  0    Vision Screening Comments: Last vision exam on May 24, 2016 with Dr. Norman Herrlich [facility]   Exercise Activities and Dietary recommendations Current Exercise Habits: Home exercise routine, Type of exercise: walking, Time (Minutes): 15, Frequency (Times/Week): 7, Weekly Exercise (Minutes/Week): 105, Intensity: Mild, Exercise limited by: None identified  Goals    . Increase physical activity          Starting 06/23/2016, I will attempt to walk at least 15 min daily.       Fall Risk Fall Risk  06/23/2016 06/22/2015 05/27/2014 02/21/2013  Falls in the past year? No No No No   Depression Screen PHQ 2/9 Scores 06/23/2016 06/22/2015 05/27/2014 02/21/2013  PHQ - 2 Score 0 0 0 0     Cognitive Function MMSE - Mini Mental State Exam 06/23/2016 06/22/2015  Orientation to time 5 5  Orientation to Place 5 5  Registration 3 3  Attention/ Calculation 0 0  Recall 3 3  Language- name 2 objects 0 0  Language- repeat 1 1  Language- follow 3 step command 3 3  Language- read & follow direction 0 0  Write a sentence 0 0  Copy design 0 0  Total score 20 20     PLEASE NOTE: A Mini-Cog screen was completed. Maximum score is 20. A value of 0 denotes this part of [place] MMSE was not completed or the patient failed this part of the Mini-Cog screening.   Mini-Cog Screening Orientation to Time - Max 5 pts Orientation to Place - Max 5 pts Registration - Max 3 pts Recall - Max 3 pts Language Repeat - Max 1 pts Language Follow 3 Step Command - Max 3 pts     Immunization History  Administered Date(s) Administered  . Influenza Split 10/18/2010  . Influenza Whole 11/30/2006, 10/28/2008, 10/27/2009  . Influenza, High Dose Seasonal PF 11/11/2015  .  Influenza-Unspecified 10/07/2012, 08/30/2013, 10/01/2014  . Pneumococcal Conjugate-13 05/27/2014  . Pneumococcal Polysaccharide-23 12/04/2011  . Td 12/04/2011  . Zoster 10/05/2010   Screening Tests Health Maintenance  Topic Date Due  . INFLUENZA VACCINE  08/16/2016  . MAMMOGRAM  11/07/2016  . COLONOSCOPY  08/02/2020  . TETANUS/TDAP  12/03/2021  . DEXA SCAN  Completed  . Hepatitis C Screening  Completed  . PNA vac Low Risk Adult  Completed  Plan:     I have personally reviewed and addressed the Medicare Annual Wellness questionnaire and have noted the following in the patient's chart:  A. Medical and social history B. Use of alcohol, tobacco or illicit drugs  C. Current medications and supplements D. Functional ability and status E.  Nutritional status F.  Physical activity G. Advance directives H. List of other physicians I.  Hospitalizations, surgeries, and ER visits in previous 12 months J.  [place] to include hearing, vision, cognitive, depression L. Referrals and appointments - none  In addition, I have reviewed and discussed with patient certain preventive protocols, quality metrics, and best practice recommendations. A written personalized care plan for preventive services as well as general preventive health recommendations were provided to patient.  See attached scanned questionnaire for additional information.   Signed,   Lindell Noe, MHA, BS, LPN Health Coach

## 2016-06-23 NOTE — Progress Notes (Signed)
Pre visit review using our clinic review tool, if applicable. No additional management support is needed unless otherwise documented below in the visit note. 

## 2016-06-23 NOTE — Patient Instructions (Addendum)
Amber Miles , Thank you for taking time to come for your Medicare Wellness Visit. I appreciate your ongoing commitment to your health goals. Please review the following plan we discussed and let me know if I can assist you in the future.   These are the goals we discussed: Goals    . Increase physical activity          Starting 06/23/2016, I will attempt to walk at least 15 min daily.        This is a list of the screening recommended for you and due dates:  Health Maintenance  Topic Date Due  . Flu Shot  08/16/2016  . Mammogram  11/07/2016  . Colon Cancer Screening  08/02/2020  . Tetanus Vaccine  12/03/2021  . DEXA scan (bone density measurement)  Completed  .  Hepatitis C: One time screening is recommended by Center for Disease Control  (CDC) for  adults born from [birth year] through 1966.   Completed  . Pneumonia vaccines  Completed    Preventive Care for Adults  A healthy lifestyle and preventive care can promote health and wellness. Preventive health guidelines for adults include the following key practices.  . A routine yearly physical is a good way to check with your health care provider about your health and preventive screening. It is a chance to share any concerns and updates on your health and to receive a thorough exam.  . Visit your dentist for a routine exam and preventive care every 6 months. Brush your teeth twice a day and floss once a day. Good oral hygiene prevents tooth decay and gum disease.  . The frequency of eye exams is based on your age, health, family medical history, use  of contact lenses, and other factors. Follow your health care provider's ecommendations for frequency of eye exams.  . Eat a healthy diet. Foods like vegetables, fruits, whole grains, low-fat dairy products, and lean protein foods contain the nutrients you need without too many calories. Decrease your intake of foods high in solid fats, added sugars, and salt. Eat the right amount of calories for  you. Get information about a proper diet from your health care provider, if necessary.  . Regular physical exercise is one of the most important things you can do for your health. Most adults should get at least 150 minutes of moderate-intensity exercise (any activity that increases your heart rate and causes you to sweat) each week. In addition, most adults need muscle-strengthening exercises on 2 or more days a week.  Silver Sneakers may be a benefit available to you. To determine eligibility, you may visit the website: www.silversneakers.com or contact program at (250)353-4462 Mon-Fri between 8AM-8PM.   . Maintain a healthy weight. The body mass index (BMI) is a screening tool to identify possible weight problems. It provides an estimate of body fat based on height and weight. Your health care provider can find your BMI and can help you achieve or maintain a healthy weight.   For adults 20 years and older: ? A BMI below 18.5 is considered underweight. ? A BMI of 18.5 to 24.9 is normal. ? A BMI of 25 to 29.9 is considered overweight. ? A BMI of 30 and above is considered obese.   . Maintain normal blood lipids and cholesterol levels by exercising and minimizing your intake of saturated fat. Eat a balanced diet with plenty of fruit and vegetables. Blood tests for lipids and cholesterol should begin at age 87 and be  repeated every 5 years. If your lipid or cholesterol levels are high, you are over 50, or you are at high risk for heart disease, you may need your cholesterol levels checked more frequently. Ongoing high lipid and cholesterol levels should be treated with medicines if diet and exercise are not working.  . If you smoke, find out from your health care provider how to quit. If you do not use tobacco, please do not start.  . If you choose to drink alcohol, please do not consume more than 2 drinks per day. One drink is considered to be 12 ounces (355 mL) of beer, 5 ounces (148 mL) of  wine, or 1.5 ounces (44 mL) of liquor.  . If you are 39-37 years old, ask your health care provider if you should take aspirin to prevent strokes.  . Use sunscreen. Apply sunscreen liberally and repeatedly throughout the day. You should seek shade when your shadow is shorter than you. Protect yourself by wearing long sleeves, pants, a wide-brimmed hat, and sunglasses year round, whenever you are outdoors.  . Once a month, do a whole body skin exam, using a mirror to look at the skin on your back. Tell your health care provider of new moles, moles that have irregular borders, moles that are larger than a pencil eraser, or moles that have changed in shape or color.

## 2016-06-23 NOTE — Progress Notes (Signed)
PCP notes:   Health maintenance:  No gaps identified.   Abnormal screenings:   Hearing - failed  Patient concerns:   None  Nurse concerns:  None  Next PCP appt:   06/26/16 @ 1015  I reviewed health advisor's note, was available for consultation, and agree with documentation and plan. Loura Pardon MD

## 2016-06-26 ENCOUNTER — Encounter: Payer: Self-pay | Admitting: Family Medicine

## 2016-06-26 ENCOUNTER — Ambulatory Visit (INDEPENDENT_AMBULATORY_CARE_PROVIDER_SITE_OTHER): Payer: Medicare Other | Admitting: Family Medicine

## 2016-06-26 ENCOUNTER — Encounter: Payer: Medicare Other | Admitting: Family Medicine

## 2016-06-26 VITALS — BP 136/76 | HR 96 | Temp 97.7°F | Ht 67.5 in | Wt 237.8 lb

## 2016-06-26 DIAGNOSIS — E039 Hypothyroidism, unspecified: Secondary | ICD-10-CM | POA: Diagnosis not present

## 2016-06-26 DIAGNOSIS — Z6836 Body mass index (BMI) 36.0-36.9, adult: Secondary | ICD-10-CM | POA: Diagnosis not present

## 2016-06-26 DIAGNOSIS — R739 Hyperglycemia, unspecified: Secondary | ICD-10-CM | POA: Diagnosis not present

## 2016-06-26 DIAGNOSIS — I10 Essential (primary) hypertension: Secondary | ICD-10-CM | POA: Diagnosis not present

## 2016-06-26 DIAGNOSIS — N951 Menopausal and female climacteric states: Secondary | ICD-10-CM | POA: Diagnosis not present

## 2016-06-26 DIAGNOSIS — E7800 Pure hypercholesterolemia, unspecified: Secondary | ICD-10-CM

## 2016-06-26 MED ORDER — POTASSIUM CHLORIDE CRYS ER 20 MEQ PO TBCR: EXTENDED_RELEASE_TABLET | ORAL | 3 refills | Status: DC

## 2016-06-26 MED ORDER — LISINOPRIL 5 MG PO TABS: 5.0000 mg | ORAL_TABLET | Freq: Every day | ORAL | 3 refills | Status: DC

## 2016-06-26 MED ORDER — ESTRADIOL 0.0375 MG/24HR TD PTTW: MEDICATED_PATCH | TRANSDERMAL | 3 refills | Status: DC

## 2016-06-26 MED ORDER — ESOMEPRAZOLE MAGNESIUM 40 MG PO CPDR: DELAYED_RELEASE_CAPSULE | ORAL | 3 refills | Status: DC

## 2016-06-26 MED ORDER — TRIAMTERENE-HCTZ 37.5-25 MG PO TABS: 2.0000 | ORAL_TABLET | Freq: Every day | ORAL | 3 refills | Status: DC

## 2016-06-26 MED ORDER — VERAPAMIL HCL ER 240 MG PO TBCR: EXTENDED_RELEASE_TABLET | ORAL | 3 refills | Status: DC

## 2016-06-26 NOTE — Assessment & Plan Note (Signed)
Pt prefers to stay on HRT (she is on patch)  Wants to return to the smaller size patch and use 2- unsure if ins will pay/she will check into that   Understands inc risk of breast cancer and blood clots with HRT and chooses to continue

## 2016-06-26 NOTE — Progress Notes (Signed)
Subjective:    Patient ID: Amber Miles, female    DOB: [birth year], 71 y.o.   MRN: [id number]  HPI Here for annual f/u of chronic health problems  She thinks she is doing very well   Has contusion on her R arm from when the cat jumped on her   Stable meningioma - sees neurosurg   [facility] visit 6/8 failed hearing - L ear  No care gaps  No falls  States that she tolerates hearing - has seen ENT before and has tinnitus  Not interested in hearing aides at this time   Wt Readings from Last 3 Encounters:  06/26/16 237 lb 12 oz (107.8 kg)  06/23/16 236 lb 8 oz (107.3 kg)  10/14/15 244 lb 8 oz (110.9 kg)  wt is stable  Down 7 lb from the fall   (she is controlling her portions and eating more produce)  bmi 36.6  Mammogram 10/17 negative  Self breast exam -no lumps  She had a hysterectomy in the past  She continues the estradiol patch (does not like the size of it)   Colonoscopy 7/17 - 5 year recall for polyps  Dr Allyn Kenner   dexa 3/15- nl BMD No falls or fractures  She is not taking ca and D Drinks milk  Takes mvi   zostavax 9/12  bp is stable today  No cp or palpitations or headaches or edema  No side effects to medicines  BP Readings from Last 3 Encounters:  06/26/16 136/76  06/23/16 120/76  10/14/15 140/70       Hypothyroidism  Pt has no clinical changes No change in energy level/ hair or skin/ edema and no tremor Lab Results  Component Value Date   TSH 3.41 06/23/2016     Sees Dr Dareen Piano for this   Hyperglycemia Lab Results  Component Value Date   HGBA1C 5.7 06/23/2016  this is down form 6.1 Wt loss and eating better   Hyperlipidemia Lab Results  Component Value Date   CHOL 180 06/23/2016   CHOL 182 06/15/2015   CHOL 170 05/15/2014   Lab Results  Component Value Date   HDL 42.40 06/23/2016   HDL 41.60 06/15/2015   HDL 44.00 05/15/2014   Lab Results  Component Value Date   LDLCALC 106 (H) 06/23/2016   LDLCALC 103 (H) 05/15/2014   LDLCALC  112 (H) 12/09/2012   Lab Results  Component Value Date   TRIG 157.0 (H) 06/23/2016   TRIG 209.0 (H) 06/15/2015   TRIG 113.0 05/15/2014   Lab Results  Component Value Date   CHOLHDL 4 06/23/2016   CHOLHDL 4 06/15/2015   CHOLHDL 4 05/15/2014   Lab Results  Component Value Date   LDLDIRECT 105.0 06/15/2015    Trig down/ blood sugar control is better     Chemistry      Component Value Date/Time   NA 137 06/23/2016 0948   K 4.0 06/23/2016 0948   CL 102 06/23/2016 0948   CO2 29 06/23/2016 0948   BUN 22 06/23/2016 0948   CREATININE 1.15 06/23/2016 0948   CREATININE 1.05 08/26/2012 1134      Component Value Date/Time   CALCIUM 10.2 06/23/2016 0948   ALKPHOS 74 06/23/2016 0948   AST 16 06/23/2016 0948   ALT 16 06/23/2016 0948   BILITOT 0.5 06/23/2016 0948     glucose 117  Lab Results  Component Value Date   WBC 6.7 06/23/2016   HGB 15.1 (H) 06/23/2016  HCT 45.1 06/23/2016   MCV 90.5 06/23/2016   PLT 302.0 06/23/2016     Review of Systems Review of Systems  Constitutional: Negative for fever, appetite change, fatigue and unexpected weight change.  Eyes: Negative for pain and visual disturbance.  Respiratory: Negative for cough and shortness of breath.   Cardiovascular: Negative for cp or palpitations    Gastrointestinal: Negative for nausea, diarrhea and constipation.  Genitourinary: Negative for urgency and frequency.  Skin: Negative for pallor or rash   MSK pos for low back pain/ (due for another injection) -this make her L toes go numb  Neurological: Negative for weakness, light-headedness, and headaches.  Hematological: Negative for adenopathy. Does not bruise/bleed easily.  Psychiatric/Behavioral: Negative for dysphoric mood. The patient is not nervous/anxious.         Objective:   Physical Exam  Constitutional: She appears well-developed and well-nourished. No distress.  obese and well appearing   HENT:  Head: Normocephalic and atraumatic.  Right  Ear: External ear normal.  Left Ear: External ear normal.  Mouth/Throat: Oropharynx is clear and moist.  Eyes: Conjunctivae and EOM are normal. Pupils are equal, round, and reactive to light. No scleral icterus.  Neck: Normal range of motion. Neck supple. No JVD present. Carotid bruit is not present. No thyromegaly present.  Cardiovascular: Normal rate, regular rhythm, normal heart sounds and intact distal pulses.  Exam reveals no gallop.   Pulmonary/Chest: Effort normal and breath sounds normal. No respiratory distress. She has no wheezes. She exhibits no tenderness.  Abdominal: Soft. Bowel sounds are normal. She exhibits no distension, no abdominal bruit and no mass. There is no tenderness.  Genitourinary: No breast swelling, tenderness, discharge or bleeding.  Genitourinary Comments: Breast exam: No mass, nodules, thickening, tenderness, bulging, retraction, inflamation, nipple discharge or skin changes noted.  No axillary or clavicular LA.      Musculoskeletal: Normal range of motion. She exhibits no edema or tenderness.  Lymphadenopathy:    She has no cervical adenopathy.  Neurological: She is alert. She has normal reflexes. No cranial nerve deficit. She exhibits normal muscle tone. Coordination normal.  Skin: Skin is warm and dry. No rash noted. No erythema. No pallor.  Ecchymosis on L forearm (from cat related injury)-no broken skin   Solar lentigines diffusely  Small angiomas diffusely  Psychiatric: She has a normal mood and affect.  Pleasant Mentally sharp          Assessment & Plan:   Problem List Items Addressed This Visit      Cardiovascular and Mediastinum   HYPERTENSION, BENIGN - Primary    bp in fair control at this time  BP Readings from Last 1 Encounters:  06/26/16 136/76   No changes needed Disc lifstyle change with low sodium diet and exercise  Labs reviewed  Wt loss enc      Relevant Medications   lisinopril (PRINIVIL,ZESTRIL) 5 MG tablet    triamterene-hydrochlorothiazide (MAXZIDE-25) 37.5-25 MG tablet   verapamil (CALAN-SR) 240 MG CR tablet     Endocrine   Hypothyroidism    Hypothyroidism  Pt has no clinical changes No change in energy level/ hair or skin/ edema and no tremor Lab Results  Component Value Date   TSH 3.41 06/23/2016    She continues to see endocrinologist         Other   Hyperglycemia    Lab Results  Component Value Date   HGBA1C 5.7 06/23/2016   Improved with better diet/wt loss and smaller portions  Hyperlipidemia   Relevant Medications   lisinopril (PRINIVIL,ZESTRIL) 5 MG tablet   triamterene-hydrochlorothiazide (MAXZIDE-25) 37.5-25 MG tablet   verapamil (CALAN-SR) 240 MG CR tablet   Obesity    Discussed how this problem influences overall health and the risks it imposes  Reviewed plan for weight loss with lower calorie diet (via better food choices and also portion control or program like weight watchers) and exercise building up to or more than 30 minutes 5 days per week including some aerobic activity   Has lost 7 lb so far from fall-enc to keep working on it       Vasomotor symptoms due to menopause    Pt prefers to stay on HRT (she is on patch)  Wants to return to the smaller size patch and use 2- unsure if ins will pay/she will check into that   Understands inc risk of breast cancer and blood clots with HRT and chooses to continue

## 2016-06-26 NOTE — Patient Instructions (Addendum)
Try to get 1200-1500 mg of calcium per day with at least 1000 iu of vitamin D - for bone health  Add this to your milk and also your multi vitamin   Call your insurance regarding the estrogen patch -? If they would cover 2 smaller dose patches instead of the larger one   Take care of yourself Keep working on weight loss Stay active !  Keep reading also

## 2016-06-26 NOTE — Assessment & Plan Note (Signed)
Lab Results  Component Value Date   HGBA1C 5.7 06/23/2016   Improved with better diet/wt loss and smaller portions

## 2016-06-26 NOTE — Assessment & Plan Note (Signed)
Discussed how this problem influences overall health and the risks it imposes  Reviewed plan for weight loss with lower calorie diet (via better food choices and also portion control or program like weight watchers) and exercise building up to or more than 30 minutes 5 days per week including some aerobic activity   Has lost 7 lb so far from fall-enc to keep working on it

## 2016-06-26 NOTE — Assessment & Plan Note (Signed)
Hypothyroidism  Pt has no clinical changes No change in energy level/ hair or skin/ edema and no tremor Lab Results  Component Value Date   TSH 3.41 06/23/2016    She continues to see endocrinologist

## 2016-06-26 NOTE — Assessment & Plan Note (Signed)
bp in fair control at this time  BP Readings from Last 1 Encounters:  06/26/16 136/76   No changes needed Disc lifstyle change with low sodium diet and exercise  Labs reviewed  Wt loss enc

## 2016-08-15 ENCOUNTER — Ambulatory Visit (INDEPENDENT_AMBULATORY_CARE_PROVIDER_SITE_OTHER): Payer: Medicare Other | Admitting: Podiatry

## 2016-08-15 DIAGNOSIS — B351 Tinea unguium: Secondary | ICD-10-CM | POA: Diagnosis not present

## 2016-08-15 DIAGNOSIS — M79676 Pain in unspecified toe(s): Secondary | ICD-10-CM

## 2016-08-15 NOTE — Progress Notes (Signed)
   SUBJECTIVE Patient  presents to office today complaining of elongated, thickened nails. Pain while ambulating in shoes. Patient is unable to trim their own nails.   OBJECTIVE General Patient is awake, alert, and oriented x 3 and in no acute distress. Derm Skin is dry and supple bilateral. Negative open lesions or macerations. Remaining integument unremarkable. Nails are tender, long, thickened and dystrophic with subungual debris, consistent with onychomycosis, 1-5 bilateral. No signs of infection noted. Vasc  DP and PT pedal pulses palpable bilaterally. Temperature gradient within normal limits.  Neuro Epicritic and protective threshold sensation diminished bilaterally.  Musculoskeletal Exam No symptomatic pedal deformities noted bilateral. Muscular strength within normal limits.  ASSESSMENT 1. Onychodystrophic nails 1-5 bilateral with hyperkeratosis of nails.  2. Onychomycosis of nail due to dermatophyte bilateral 3. Pain in foot bilateral  PLAN OF CARE 1. Patient evaluated today.  2. Instructed to maintain good pedal hygiene and foot care.  3. Mechanical debridement of nails 1-5 bilaterally performed using a nail nipper. Filed with dremel without incident.  4. Return to clinic in 3 mos.    Shauntelle Jamerson M. Jakaree Pickard, DPM [facility]  Dr. Murrell Elizondo M. Jesslynn Kruck, DPM    [address]                                        [place], [place] [address]                Office [phone]  Fax [phone]      

## 2016-09-12 ENCOUNTER — Ambulatory Visit (INDEPENDENT_AMBULATORY_CARE_PROVIDER_SITE_OTHER): Payer: Medicare Other | Admitting: Podiatry

## 2016-09-12 ENCOUNTER — Encounter: Payer: Self-pay | Admitting: Podiatry

## 2016-09-12 DIAGNOSIS — L603 Nail dystrophy: Secondary | ICD-10-CM

## 2016-09-20 NOTE — Progress Notes (Signed)
   HPI: 71 year old female presents today for evaluation of the left great toenail. She states that a piece of the nail came off on her last appointment she is concerned about it. She presents today for further treatment and evaluation. She denies trauma.  Past Medical History:  Diagnosis Date  . Allergy    allergic rhinitis  . Anxiety   . Arthritis    OA knee replacement  . GERD (gastroesophageal reflux disease)   . Hyperlipidemia   . Hypertension   . Hypothyroid   . Reactive airways dysfunction syndrome ([place])   . Skin lesions, generalized    squamous cell skin lesions and basal cell skin lesions      Physical Exam: General: The patient is alert and oriented x3 in no acute distress.  Dermatology: Partially detached toenail noted to the left great toe with some mild onychodystrophy and discoloration. Skin is warm, dry and supple bilateral lower extremities. Negative for open lesions or macerations.  Vascular: Palpable pedal pulses bilaterally. No edema or erythema noted. Capillary refill within normal limits.  Neurological: Epicritic and protective threshold grossly intact bilaterally.   Musculoskeletal Exam: Range of motion within normal limits to all pedal and ankle joints bilateral. Muscle strength 5/5 in all groups bilateral.   Assessment: 1. Dystrophic nail plate left great toe   Plan of Care:  1. Patient was evaluated. 2. Mechanical debridement of nails performed using a nail nipper without incident or bleeding. 3. Return to clinic on a scheduled appointment   Edrick Kins, DPM [facility]  Dr. Edrick Kins, DPM    [address], [place] [address]                Office [phone]  Fax [phone]

## 2016-10-05 DIAGNOSIS — H1045 Other chronic allergic conjunctivitis: Secondary | ICD-10-CM | POA: Diagnosis not present

## 2016-10-05 DIAGNOSIS — J300 Vasomotor rhinitis: Secondary | ICD-10-CM | POA: Diagnosis not present

## 2016-10-05 DIAGNOSIS — Z23 Encounter for immunization: Secondary | ICD-10-CM | POA: Diagnosis not present

## 2016-10-05 DIAGNOSIS — J4530 Mild persistent asthma, uncomplicated: Secondary | ICD-10-CM | POA: Diagnosis not present

## 2016-10-13 ENCOUNTER — Other Ambulatory Visit: Payer: Self-pay | Admitting: Family Medicine

## 2016-10-13 DIAGNOSIS — Z1231 Encounter for screening mammogram for malignant neoplasm of breast: Secondary | ICD-10-CM

## 2016-10-17 DIAGNOSIS — M5416 Radiculopathy, lumbar region: Secondary | ICD-10-CM | POA: Diagnosis not present

## 2016-10-17 DIAGNOSIS — M545 Low back pain: Secondary | ICD-10-CM | POA: Diagnosis not present

## 2016-10-17 DIAGNOSIS — M47817 Spondylosis without myelopathy or radiculopathy, lumbosacral region: Secondary | ICD-10-CM | POA: Diagnosis not present

## 2016-10-19 DIAGNOSIS — M5416 Radiculopathy, lumbar region: Secondary | ICD-10-CM | POA: Diagnosis not present

## 2016-10-19 DIAGNOSIS — M47817 Spondylosis without myelopathy or radiculopathy, lumbosacral region: Secondary | ICD-10-CM | POA: Diagnosis not present

## 2016-10-19 DIAGNOSIS — M545 Low back pain: Secondary | ICD-10-CM | POA: Diagnosis not present

## 2016-11-09 ENCOUNTER — Ambulatory Visit
Admission: RE | Admit: 2016-11-09 | Discharge: 2016-11-09 | Disposition: A | Discharge status: Home / Self Care | Payer: Medicare Other | Source: Ambulatory Visit | Attending: Family Medicine | Admitting: Family Medicine

## 2016-11-09 DIAGNOSIS — Z1231 Encounter for screening mammogram for malignant neoplasm of breast: Secondary | ICD-10-CM | POA: Diagnosis not present

## 2016-11-10 ENCOUNTER — Other Ambulatory Visit: Payer: Self-pay | Admitting: Family Medicine

## 2016-11-10 DIAGNOSIS — R928 Other abnormal and inconclusive findings on diagnostic imaging of breast: Secondary | ICD-10-CM

## 2016-11-14 ENCOUNTER — Ambulatory Visit: Payer: Medicare Other | Admitting: Podiatry

## 2016-11-14 DIAGNOSIS — M5416 Radiculopathy, lumbar region: Secondary | ICD-10-CM | POA: Diagnosis not present

## 2016-11-14 DIAGNOSIS — M47817 Spondylosis without myelopathy or radiculopathy, lumbosacral region: Secondary | ICD-10-CM | POA: Diagnosis not present

## 2016-11-14 DIAGNOSIS — M545 Low back pain: Secondary | ICD-10-CM | POA: Diagnosis not present

## 2016-11-21 ENCOUNTER — Encounter: Payer: Self-pay | Admitting: Podiatry

## 2016-11-21 ENCOUNTER — Ambulatory Visit (INDEPENDENT_AMBULATORY_CARE_PROVIDER_SITE_OTHER): Payer: Medicare Other | Admitting: Podiatry

## 2016-11-21 DIAGNOSIS — L821 Other seborrheic keratosis: Secondary | ICD-10-CM | POA: Diagnosis not present

## 2016-11-21 DIAGNOSIS — B351 Tinea unguium: Secondary | ICD-10-CM | POA: Diagnosis not present

## 2016-11-21 DIAGNOSIS — M79676 Pain in unspecified toe(s): Secondary | ICD-10-CM | POA: Diagnosis not present

## 2016-11-21 DIAGNOSIS — Z85828 Personal history of other malignant neoplasm of skin: Secondary | ICD-10-CM | POA: Diagnosis not present

## 2016-11-21 DIAGNOSIS — D225 Melanocytic nevi of trunk: Secondary | ICD-10-CM | POA: Diagnosis not present

## 2016-11-21 DIAGNOSIS — L570 Actinic keratosis: Secondary | ICD-10-CM | POA: Diagnosis not present

## 2016-11-22 ENCOUNTER — Ambulatory Visit
Admission: RE | Admit: 2016-11-22 | Discharge: 2016-11-22 | Disposition: A | Discharge status: Home / Self Care | Payer: Medicare Other | Source: Ambulatory Visit | Attending: Family Medicine | Admitting: Family Medicine

## 2016-11-22 ENCOUNTER — Other Ambulatory Visit: Payer: Self-pay | Admitting: Family Medicine

## 2016-11-22 DIAGNOSIS — R928 Other abnormal and inconclusive findings on diagnostic imaging of breast: Secondary | ICD-10-CM | POA: Diagnosis not present

## 2016-11-22 DIAGNOSIS — N6489 Other specified disorders of breast: Secondary | ICD-10-CM | POA: Diagnosis not present

## 2016-11-22 DIAGNOSIS — N6310 Unspecified lump in the right breast, unspecified quadrant: Secondary | ICD-10-CM

## 2016-11-23 ENCOUNTER — Other Ambulatory Visit: Payer: Self-pay | Admitting: Family Medicine

## 2016-11-23 ENCOUNTER — Ambulatory Visit
Admission: RE | Admit: 2016-11-23 | Discharge: 2016-11-23 | Disposition: A | Discharge status: Home / Self Care | Payer: Medicare Other | Source: Ambulatory Visit | Attending: Family Medicine | Admitting: Family Medicine

## 2016-11-23 DIAGNOSIS — N6310 Unspecified lump in the right breast, unspecified quadrant: Secondary | ICD-10-CM

## 2016-11-23 DIAGNOSIS — R928 Other abnormal and inconclusive findings on diagnostic imaging of breast: Secondary | ICD-10-CM

## 2016-11-23 DIAGNOSIS — D241 Benign neoplasm of right breast: Secondary | ICD-10-CM | POA: Diagnosis not present

## 2016-11-23 DIAGNOSIS — N6311 Unspecified lump in the right breast, upper outer quadrant: Secondary | ICD-10-CM | POA: Diagnosis not present

## 2016-11-23 NOTE — Progress Notes (Signed)
   SUBJECTIVE Patient presents to office today complaining of elongated, thickened nails. Pain while ambulating in shoes. Patient is unable to trim their own nails.   Past Medical History:  Diagnosis Date  . Allergy    allergic rhinitis  . Anxiety   . Arthritis    OA knee replacement  . GERD (gastroesophageal reflux disease)   . Hyperlipidemia   . Hypertension   . Hypothyroid   . Reactive airways dysfunction syndrome ([place])   . Skin lesions, generalized    squamous cell skin lesions and basal cell skin lesions    OBJECTIVE General Patient is awake, alert, and oriented x 3 and in no acute distress. Derm Skin is dry and supple bilateral. Negative open lesions or macerations. Remaining integument unremarkable. Nails are tender, long, thickened and dystrophic with subungual debris, consistent with onychomycosis, 1-5 bilateral. No signs of infection noted. Vasc  DP and PT pedal pulses palpable bilaterally. Temperature gradient within normal limits.  Neuro Epicritic and protective threshold sensation diminished bilaterally.  Musculoskeletal Exam No symptomatic pedal deformities noted bilateral. Muscular strength within normal limits.  ASSESSMENT 1. Onychodystrophic nails 1-5 bilateral with hyperkeratosis of nails.  2. Onychomycosis of nail due to dermatophyte bilateral 3. Pain in foot bilateral  PLAN OF CARE 1. Patient evaluated today.  2. Instructed to maintain good pedal hygiene and foot care.  3. Mechanical debridement of nails 1-5 bilaterally performed using a nail nipper. Filed with dremel without incident.  4. Return to clinic in 3 mos.    Edrick Kins, DPM [facility]  Dr. Edrick Kins, [address]                                        [place], [place] [address]                Office [phone]  Fax [phone]

## 2017-01-02 ENCOUNTER — Other Ambulatory Visit: Payer: Self-pay | Admitting: Family Medicine

## 2017-01-02 NOTE — Telephone Encounter (Signed)
CPE scheduled 07/09/17, tramadol last filled on 06/06/16 #90 with 0 refills, zanaflex last filled on 06/18/15 #60 tabs with 1 refill, please advise

## 2017-01-02 NOTE — Telephone Encounter (Signed)
Px written for call in   

## 2017-01-02 NOTE — Telephone Encounter (Signed)
Medication phoned to pharmacy.  

## 2017-01-04 ENCOUNTER — Encounter: Payer: Self-pay | Admitting: Internal Medicine

## 2017-01-04 ENCOUNTER — Ambulatory Visit (INDEPENDENT_AMBULATORY_CARE_PROVIDER_SITE_OTHER): Payer: Medicare Other | Admitting: Internal Medicine

## 2017-01-04 VITALS — BP 132/66 | HR 69 | Temp 97.9°F | Wt 234.0 lb

## 2017-01-04 DIAGNOSIS — W19XXXA Unspecified fall, initial encounter: Secondary | ICD-10-CM | POA: Diagnosis not present

## 2017-01-04 DIAGNOSIS — S8001XA Contusion of right knee, initial encounter: Secondary | ICD-10-CM | POA: Diagnosis not present

## 2017-01-04 NOTE — Assessment & Plan Note (Signed)
And mild twisting injury to left foot No serious injury Discussed ice, analgesics

## 2017-01-04 NOTE — Assessment & Plan Note (Signed)
Discussed injury prevention

## 2017-01-04 NOTE — Progress Notes (Signed)
Subjective:    Patient ID: Amber Miles, female    DOB: Dec 29, 1945, 71 y.o.   MRN: [id number]  HPI Here with concerns after a fall--with daughter and granddaughter  Yesterday afternoon was in bed----jumped up due to doorbell ringing Not sure but thinks her left foot was numb and she fell Fell onto right knee and right arm Crawled back to bed and pulled herself up Able to get to the door (grandson there)  Bruising along extensor forearm and mild pain--nothing worrisome Left ankle and right knee are giving ongoing pain Some swelling--- she iced the right knee  Taking tramadol that she has (and tizanidine) Only really painful when she is up on it  Current Outpatient Medications on File Prior to Visit  Medication Sig Dispense Refill  . acyclovir (ZOVIRAX) 800 MG tablet TAKE 1 TABLET BY MOUTH 3 TIMES DAILY FOR 3 DAYS FLARES  2  . docusate sodium (COLACE) 100 MG capsule Take 100 mg by mouth 2 (two) times daily.    Marland Kitchen esomeprazole (NEXIUM) 40 MG capsule TAKE ONE CAPSULE BY MOUTH EVERY MORNING BEFORE BREAKFAST 90 capsule 3  . estradiol (CLIMARA - DOSED IN MG/24 HR) 0.0375 mg/24hr patch PLACE 1 PATCH ONTO THE SKIN ONCE WEEKLY  3  . estradiol (VIVELLE-DOT) 0.0375 MG/24HR PLACE 1 PATCH ONTO THE SKIN ONCE WEEKLY 12 patch 3  . Glucosamine-Chondroitin 750-600 MG TABS Take 1 tablet by mouth 2 (two) times daily.      Marland Kitchen lisinopril (PRINIVIL,ZESTRIL) 5 MG tablet Take 1 tablet (5 mg total) by mouth daily. 90 tablet 3  . montelukast (SINGULAIR) 10 MG tablet Take 10 mg by mouth daily.     . Multiple Vitamin (MULTIVITAMIN) tablet Take 1 tablet by mouth daily.      . mupirocin ointment (BACTROBAN) 2 % Apply 1 application topically daily as needed. 22 g 1  . Olopatadine HCl (PATANASE NA) Place 2 sprays into the nose 2 (two) times daily.    . Olopatadine HCl 0.6 % SOLN     . potassium chloride SA (KLOR-CON M20) 20 MEQ tablet TAKE 1 TABLET (20 MEQ TOTAL) BY MOUTH ONCE DAILY 90 tablet 3  . PROAIR HFA 108  (90 BASE) MCG/ACT inhaler USE 2 PUFFS EVERY 4 HOURS AS NEEDED FOR WHEEZING 8.5 each 4  . QVAR REDIHALER 80 MCG/ACT inhaler     . SYNTHROID 100 MCG tablet TAKE 1 TABLET EVERY DAY BY MOUTH  11  . tiZANidine (ZANAFLEX) 4 MG tablet TAKE 1 TABLET (4 MG TOTAL) BY MOUTH EVERY 6 (SIX) HOURS AS NEEDED. 60 tablet 0  . traMADol (ULTRAM) 50 MG tablet TAKE 2 TABLETS BY MOUTH EVERY 8 HOURS AS NEEDED 90 tablet 0  . triamterene-hydrochlorothiazide (MAXZIDE-25) 37.5-25 MG tablet Take 2 tablets by mouth daily. 180 tablet 3  . verapamil (CALAN-SR) 240 MG CR tablet TAKE 1 TABLET (240 MG TOTAL) BY MOUTH 2 (TWO) TIMES DAILY. 180 tablet 3   No current facility-administered medications on file prior to visit.     Allergies  Allergen Reactions  . Neosporin [Neomycin-Polymyxin-Gramicidin] Other (See Comments)    blisters  . Tape Swelling    Itching and redness Pt said severe reaction to surgical tape.? latex    Past Medical History:  Diagnosis Date  . Allergy    allergic rhinitis  . Anxiety   . Arthritis    OA knee replacement  . GERD (gastroesophageal reflux disease)   . Hyperlipidemia   . Hypertension   . Hypothyroid   .  Reactive airways dysfunction syndrome ([place])   . Skin lesions, generalized    squamous cell skin lesions and basal cell skin lesions    Past Surgical History:  Procedure Laterality Date  . ABDOMINAL HYSTERECTOMY  1990s   hyst with oophrectomy for B9lesion/cyst and prolapse  . JOINT REPLACEMENT  05/2009   left total knee replacement  . recocele     and cystocele repair  . tsa      Family History  Problem Relation Age of Onset  . Hypertension Mother   . Stroke Mother   . Depression Mother   . Alzheimer's disease Mother   . Heart disease Father   . Hypertension Sister     Social History   Socioeconomic History  . Marital status: Widowed    Spouse name: Not on file  . Number of children: Not on file  . Years of education: Not on file  . Highest education level: Not  on file  Social Needs  . Financial resource strain: Not on file  . Food insecurity - worry: Not on file  . Food insecurity - inability: Not on file  . Transportation needs - medical: Not on file  . Transportation needs - non-medical: Not on file  Occupational History  . Not on file  Tobacco Use  . Smoking status: Never Smoker  . Smokeless tobacco: Never Used  Substance and Sexual Activity  . Alcohol use: No    Alcohol/week: 0.0 oz  . Drug use: No  . Sexual activity: No  Other Topics Concern  . Not on file  Social History Narrative  . Not on file   Review of Systems  Not sick Some nausea yesterday afternoon Appetite is off     Objective:   Physical Exam  Constitutional: No distress.  Musculoskeletal:  Right knee contusion medially with significant soft tissue swelling. No apparent knee effusion and no meniscus or ligament findings Left foot has mild tenderness along the lateral side but no clear bony tenderness in metatarsals. No ankle tenderness (but chronic lateral swelling)  Neurological:  Fairly normal gait No weakness          Assessment & Plan:

## 2017-01-26 DIAGNOSIS — M47817 Spondylosis without myelopathy or radiculopathy, lumbosacral region: Secondary | ICD-10-CM | POA: Diagnosis not present

## 2017-01-26 DIAGNOSIS — M5416 Radiculopathy, lumbar region: Secondary | ICD-10-CM | POA: Diagnosis not present

## 2017-01-26 DIAGNOSIS — M545 Low back pain: Secondary | ICD-10-CM | POA: Diagnosis not present

## 2017-02-13 DIAGNOSIS — M545 Low back pain: Secondary | ICD-10-CM | POA: Diagnosis not present

## 2017-02-13 DIAGNOSIS — M47817 Spondylosis without myelopathy or radiculopathy, lumbosacral region: Secondary | ICD-10-CM | POA: Diagnosis not present

## 2017-02-13 DIAGNOSIS — M5416 Radiculopathy, lumbar region: Secondary | ICD-10-CM | POA: Diagnosis not present

## 2017-02-15 ENCOUNTER — Telehealth: Payer: Self-pay | Admitting: Family Medicine

## 2017-02-15 NOTE — Telephone Encounter (Signed)
Patient is requesting a change in therapy- Estrogen patch- she wants twice weekly patch. She feels it will be smaller and stick better.  Pharmacy: verified  LOV: 01/04/17 (acute)

## 2017-02-15 NOTE — Telephone Encounter (Signed)
Copied from [place]. Topic: Quick Communication - See Telephone Encounter >> Feb 15, 2017  1:46 PM Bea Graff, NT wrote: CRM for notification. See Telephone encounter for: Pt had her estrogen medicine changed in June due to her insurance not covering the medication dosage. She has new insurance this year and would like to change her estradiol (CLIMARA - DOSED IN MG/24 HR) 0.0375 mg to 1 patch twice a week instead of 1 patch once a week. Due to size of patch and less sticky. Uses [facility] [address].  02/15/17.

## 2017-02-15 NOTE — Telephone Encounter (Signed)
All the Climara options in epic say weekly--I am confused ? Check with the pharmacist

## 2017-02-15 NOTE — Telephone Encounter (Signed)
Check with [place] the pharmacist at [facility] and she said that the Climara is a once weekly, the only patch that they have that's 2 weekly is minivelle patches

## 2017-02-16 MED ORDER — ESTRADIOL 0.0375 MG/24HR TD PTTW: 1.0000 | MEDICATED_PATCH | TRANSDERMAL | 11 refills | Status: DC

## 2017-02-16 NOTE — Telephone Encounter (Signed)
I sent that patch

## 2017-02-23 ENCOUNTER — Ambulatory Visit (INDEPENDENT_AMBULATORY_CARE_PROVIDER_SITE_OTHER): Payer: Medicare Other | Admitting: Podiatry

## 2017-02-23 ENCOUNTER — Encounter: Payer: Self-pay | Admitting: Podiatry

## 2017-02-23 DIAGNOSIS — B351 Tinea unguium: Secondary | ICD-10-CM | POA: Diagnosis not present

## 2017-02-23 DIAGNOSIS — M79676 Pain in unspecified toe(s): Secondary | ICD-10-CM

## 2017-02-26 NOTE — Progress Notes (Signed)
   SUBJECTIVE Patient presents to office today complaining of elongated, thickened nails. Pain while ambulating in shoes. Patient is unable to trim their own nails.   Past Medical History:  Diagnosis Date  . Allergy    allergic rhinitis  . Anxiety   . Arthritis    OA knee replacement  . GERD (gastroesophageal reflux disease)   . Hyperlipidemia   . Hypertension   . Hypothyroid   . Reactive airways dysfunction syndrome ([place])   . Skin lesions, generalized    squamous cell skin lesions and basal cell skin lesions    OBJECTIVE General Patient is awake, alert, and oriented x 3 and in no acute distress. Derm Skin is dry and supple bilateral. Negative open lesions or macerations. Remaining integument unremarkable. Nails are tender, long, thickened and dystrophic with subungual debris, consistent with onychomycosis, 1-5 bilateral. No signs of infection noted. Vasc  DP and PT pedal pulses palpable bilaterally. Temperature gradient within normal limits.  Neuro Epicritic and protective threshold sensation diminished bilaterally.  Musculoskeletal Exam No symptomatic pedal deformities noted bilateral. Muscular strength within normal limits.  ASSESSMENT 1. Onychodystrophic nails 1-5 bilateral with hyperkeratosis of nails.  2. Onychomycosis of nail due to dermatophyte bilateral 3. Pain in foot bilateral  PLAN OF CARE 1. Patient evaluated today.  2. Instructed to maintain good pedal hygiene and foot care.  3. Mechanical debridement of nails 1-5 bilaterally performed using a nail nipper. Filed with dremel without incident.  4. Return to clinic in 3 mos.    Edrick Kins, DPM [facility]  Dr. Edrick Kins, [address]                                        [place], [place] [address]                Office [phone]  Fax [phone]

## 2017-03-15 ENCOUNTER — Telehealth: Payer: Self-pay | Admitting: *Deleted

## 2017-03-15 NOTE — Telephone Encounter (Signed)
PA for pt's estradiol (VIVELLE-DOT) patches done at [email or web address], I will await a response

## 2017-03-16 NOTE — Telephone Encounter (Signed)
PA was approved and pharmacy notified, letter placed in Dr. Marliss Coots inbox to sign and send for scanning

## 2017-03-30 DIAGNOSIS — E042 Nontoxic multinodular goiter: Secondary | ICD-10-CM | POA: Diagnosis not present

## 2017-03-30 DIAGNOSIS — E890 Postprocedural hypothyroidism: Secondary | ICD-10-CM | POA: Diagnosis not present

## 2017-04-09 DIAGNOSIS — D34 Benign neoplasm of thyroid gland: Secondary | ICD-10-CM | POA: Diagnosis not present

## 2017-04-09 DIAGNOSIS — E042 Nontoxic multinodular goiter: Secondary | ICD-10-CM | POA: Diagnosis not present

## 2017-04-09 DIAGNOSIS — I10 Essential (primary) hypertension: Secondary | ICD-10-CM | POA: Diagnosis not present

## 2017-04-09 DIAGNOSIS — K229 Disease of esophagus, unspecified: Secondary | ICD-10-CM | POA: Diagnosis not present

## 2017-04-09 DIAGNOSIS — E0521 Thyrotoxicosis with toxic multinodular goiter with thyrotoxic crisis or storm: Secondary | ICD-10-CM | POA: Diagnosis not present

## 2017-04-09 DIAGNOSIS — J4520 Mild intermittent asthma, uncomplicated: Secondary | ICD-10-CM | POA: Diagnosis not present

## 2017-04-09 DIAGNOSIS — E890 Postprocedural hypothyroidism: Secondary | ICD-10-CM | POA: Diagnosis not present

## 2017-04-09 DIAGNOSIS — N958 Other specified menopausal and perimenopausal disorders: Secondary | ICD-10-CM | POA: Diagnosis not present

## 2017-04-26 DIAGNOSIS — D34 Benign neoplasm of thyroid gland: Secondary | ICD-10-CM | POA: Diagnosis not present

## 2017-04-26 DIAGNOSIS — E0521 Thyrotoxicosis with toxic multinodular goiter with thyrotoxic crisis or storm: Secondary | ICD-10-CM | POA: Diagnosis not present

## 2017-04-26 DIAGNOSIS — E890 Postprocedural hypothyroidism: Secondary | ICD-10-CM | POA: Diagnosis not present

## 2017-04-26 DIAGNOSIS — N958 Other specified menopausal and perimenopausal disorders: Secondary | ICD-10-CM | POA: Diagnosis not present

## 2017-04-26 DIAGNOSIS — I10 Essential (primary) hypertension: Secondary | ICD-10-CM | POA: Diagnosis not present

## 2017-04-26 DIAGNOSIS — E042 Nontoxic multinodular goiter: Secondary | ICD-10-CM | POA: Diagnosis not present

## 2017-04-26 DIAGNOSIS — J4520 Mild intermittent asthma, uncomplicated: Secondary | ICD-10-CM | POA: Diagnosis not present

## 2017-04-26 DIAGNOSIS — K229 Disease of esophagus, unspecified: Secondary | ICD-10-CM | POA: Diagnosis not present

## 2017-05-03 DIAGNOSIS — N958 Other specified menopausal and perimenopausal disorders: Secondary | ICD-10-CM | POA: Diagnosis not present

## 2017-05-03 DIAGNOSIS — J4520 Mild intermittent asthma, uncomplicated: Secondary | ICD-10-CM | POA: Diagnosis not present

## 2017-05-03 DIAGNOSIS — E042 Nontoxic multinodular goiter: Secondary | ICD-10-CM | POA: Diagnosis not present

## 2017-05-03 DIAGNOSIS — E0521 Thyrotoxicosis with toxic multinodular goiter with thyrotoxic crisis or storm: Secondary | ICD-10-CM | POA: Diagnosis not present

## 2017-05-03 DIAGNOSIS — K229 Disease of esophagus, unspecified: Secondary | ICD-10-CM | POA: Diagnosis not present

## 2017-05-03 DIAGNOSIS — D34 Benign neoplasm of thyroid gland: Secondary | ICD-10-CM | POA: Diagnosis not present

## 2017-05-03 DIAGNOSIS — I10 Essential (primary) hypertension: Secondary | ICD-10-CM | POA: Diagnosis not present

## 2017-05-03 DIAGNOSIS — E890 Postprocedural hypothyroidism: Secondary | ICD-10-CM | POA: Diagnosis not present

## 2017-05-24 DIAGNOSIS — H2513 Age-related nuclear cataract, bilateral: Secondary | ICD-10-CM | POA: Diagnosis not present

## 2017-05-24 DIAGNOSIS — H524 Presbyopia: Secondary | ICD-10-CM | POA: Diagnosis not present

## 2017-05-25 ENCOUNTER — Ambulatory Visit (INDEPENDENT_AMBULATORY_CARE_PROVIDER_SITE_OTHER): Payer: Medicare Other | Admitting: Podiatry

## 2017-05-25 ENCOUNTER — Other Ambulatory Visit: Payer: Self-pay | Admitting: *Deleted

## 2017-05-25 ENCOUNTER — Encounter: Payer: Self-pay | Admitting: Podiatry

## 2017-05-25 DIAGNOSIS — B351 Tinea unguium: Secondary | ICD-10-CM | POA: Diagnosis not present

## 2017-05-25 DIAGNOSIS — M79676 Pain in unspecified toe(s): Secondary | ICD-10-CM

## 2017-05-25 MED ORDER — TIZANIDINE HCL 4 MG PO TABS: 4.0000 mg | ORAL_TABLET | Freq: Four times a day (QID) | ORAL | 0 refills | Status: DC | PRN

## 2017-05-25 NOTE — Telephone Encounter (Signed)
CPE scheduled 07/09/17, last filled on 01/02/17 #60 tabs with 0 refills

## 2017-05-28 NOTE — Progress Notes (Signed)
   SUBJECTIVE Patient presents to office today complaining of elongated, thickened nails that cause pain while ambulating in shoes. She is unable to trim her own nails. Patient is here for further evaluation and treatment.  Past Medical History:  Diagnosis Date  . Allergy    allergic rhinitis  . Anxiety   . Arthritis    OA knee replacement  . GERD (gastroesophageal reflux disease)   . Hyperlipidemia   . Hypertension   . Hypothyroid   . Reactive airways dysfunction syndrome ([place])   . Skin lesions, generalized    squamous cell skin lesions and basal cell skin lesions    OBJECTIVE General Patient is awake, alert, and oriented x 3 and in no acute distress. Derm Skin is dry and supple bilateral. Negative open lesions or macerations. Remaining integument unremarkable. Nails are tender, long, thickened and dystrophic with subungual debris, consistent with onychomycosis, 1-5 bilateral. No signs of infection noted. Vasc  DP and PT pedal pulses palpable bilaterally. Temperature gradient within normal limits.  Neuro Epicritic and protective threshold sensation grossly intact bilaterally.  Musculoskeletal Exam No symptomatic pedal deformities noted bilateral. Muscular strength within normal limits.  ASSESSMENT 1. Onychodystrophic nails 1-5 bilateral with hyperkeratosis of nails.  2. Onychomycosis of nail due to dermatophyte bilateral 3. Pain in foot bilateral  PLAN OF CARE 1. Patient evaluated today.  2. Instructed to maintain good pedal hygiene and foot care.  3. Mechanical debridement of nails 1-5 bilaterally performed using a nail nipper. Filed with dremel without incident.  4. Return to clinic in 3 mos.   Going on a trip to [place] in November.   Edrick Kins, DPM [facility]  Dr. Edrick Kins, [address]                                        [place], [place] [address]                Office [phone]  Fax [phone]

## 2017-07-01 ENCOUNTER — Telehealth: Payer: Self-pay | Admitting: Family Medicine

## 2017-07-01 DIAGNOSIS — I10 Essential (primary) hypertension: Secondary | ICD-10-CM

## 2017-07-01 DIAGNOSIS — R739 Hyperglycemia, unspecified: Secondary | ICD-10-CM

## 2017-07-01 DIAGNOSIS — E7800 Pure hypercholesterolemia, unspecified: Secondary | ICD-10-CM

## 2017-07-01 DIAGNOSIS — E039 Hypothyroidism, unspecified: Secondary | ICD-10-CM

## 2017-07-01 NOTE — Telephone Encounter (Signed)
-----   Message from Eustace Pen, LPN sent at 9/83/3825  6:37 PM EDT ----- Regarding: Labs 6/17 Lab orders needed. Thank you.  Insurance:  [employer]

## 2017-07-02 ENCOUNTER — Ambulatory Visit (INDEPENDENT_AMBULATORY_CARE_PROVIDER_SITE_OTHER): Payer: Medicare Other

## 2017-07-02 VITALS — BP 130/76 | HR 64 | Temp 98.1°F | Ht 67.5 in | Wt 229.5 lb

## 2017-07-02 DIAGNOSIS — I10 Essential (primary) hypertension: Secondary | ICD-10-CM | POA: Diagnosis not present

## 2017-07-02 DIAGNOSIS — E039 Hypothyroidism, unspecified: Secondary | ICD-10-CM

## 2017-07-02 DIAGNOSIS — R739 Hyperglycemia, unspecified: Secondary | ICD-10-CM | POA: Diagnosis not present

## 2017-07-02 DIAGNOSIS — Z0000 Encounter for general adult medical examination without abnormal findings: Secondary | ICD-10-CM

## 2017-07-02 DIAGNOSIS — E7800 Pure hypercholesterolemia, unspecified: Secondary | ICD-10-CM | POA: Diagnosis not present

## 2017-07-02 LAB — COMPREHENSIVE METABOLIC PANEL
ALBUMIN: 4 g/dL (ref 3.5–5.2)
ALK PHOS: 75 U/L (ref 39–117)
ALT: 15 U/L (ref 0–35)
AST: 15 U/L (ref 0–37)
BILIRUBIN TOTAL: 0.4 mg/dL (ref 0.2–1.2)
BUN: 21 mg/dL (ref 6–23)
CALCIUM: 9.8 mg/dL (ref 8.4–10.5)
CO2: 30 meq/L (ref 19–32)
CREATININE: 1.11 mg/dL (ref 0.40–1.20)
Chloride: 101 mEq/L (ref 96–112)
GFR: 51.28 mL/min — AB (ref >60.00)
Glucose, Bld: 96 mg/dL (ref 70–99)
Potassium: 3.9 mEq/L (ref 3.5–5.1)
Sodium: 139 mEq/L (ref 135–145)
TOTAL PROTEIN: 7.3 g/dL (ref 6.0–8.3)

## 2017-07-02 LAB — CBC WITH DIFFERENTIAL/PLATELET
BASOS ABS: 0.1 10*3/uL (ref 0.0–0.1)
Basophils Relative: 1 % (ref 0.0–3.0)
EOS ABS: 0.3 10*3/uL (ref 0.0–0.7)
Eosinophils Relative: 4.8 % (ref 0.0–5.0)
HEMATOCRIT: 41.4 % (ref 36.0–46.0)
HEMOGLOBIN: 13.9 g/dL (ref 12.0–15.0)
LYMPHS PCT: 24 % (ref 12.0–46.0)
Lymphs Abs: 1.4 10*3/uL (ref 0.7–4.0)
MCHC: 33.5 g/dL (ref 30.0–36.0)
MCV: 82.9 fl (ref 78.0–100.0)
Monocytes Absolute: 0.5 10*3/uL (ref 0.1–1.0)
Monocytes Relative: 8.8 % (ref 3.0–12.0)
Neutro Abs: 3.6 10*3/uL (ref 1.4–7.7)
Neutrophils Relative %: 61.4 % (ref 43.0–77.0)
Platelets: 303 10*3/uL (ref 150.0–400.0)
RBC: 4.99 Mil/uL (ref 3.87–5.11)
RDW: 15.8 % — ABNORMAL HIGH (ref 11.5–15.5)
WBC: 5.9 10*3/uL (ref 4.0–10.5)

## 2017-07-02 LAB — TSH: TSH: 2.15 u[IU]/mL (ref 0.35–4.50)

## 2017-07-02 LAB — LIPID PANEL
CHOLESTEROL: 177 mg/dL (ref 0–200)
HDL: 44 mg/dL (ref >39.00)
LDL Cholesterol: 101 mg/dL — ABNORMAL HIGH (ref 0–99)
NonHDL: 132.77
TRIGLYCERIDES: 157 mg/dL — AB (ref 0.0–149.0)
Total CHOL/HDL Ratio: 4
VLDL: 31.4 mg/dL (ref 0.0–40.0)

## 2017-07-02 LAB — HEMOGLOBIN A1C: Hgb A1c MFr Bld: 6 % (ref 4.6–6.5)

## 2017-07-02 NOTE — Progress Notes (Signed)
Subjective:   Amber Miles is a 72 y.o. female who presents for Medicare Annual/Subsequent preventive examination.  Review of Systems:  N/A Cardiac Risk Factors include: advanced age (>97men, >4 women);obesity (BMI >30kg/m2);dyslipidemia;hypertension     Objective:    Vitals: BP 130/76 (BP Location: Right Arm, Patient Position: Sitting, Cuff Size: Normal)   Pulse 64   Temp 98.1 F (36.7 C) (Oral)   Ht 5' 7.5" (1.715 m) Comment: no shoes  Wt 229 lb 8 oz (104.1 kg)   SpO2 97%   BMI 35.41 kg/m   Body mass index is 35.41 kg/m.  Advanced Directives 07/02/2017 06/23/2016 06/22/2015  Does Patient Have a Medical Advance Directive? Yes Yes Yes  Type of Paramedic of [place];Living will [place];Living will [place];Living will  Does patient want to make changes to medical advance directive? - - No - Patient declined  Copy of [place] in Chart? No - copy requested No - copy requested No - copy requested    Tobacco Social History   Tobacco Use  Smoking Status Never Smoker  Smokeless Tobacco Never Used     Counseling given: No   Clinical Intake:  Pre-visit preparation completed: Yes  Pain : No/denies pain Pain Score: 0-No pain     Nutritional Status: BMI > 30  Obese Nutritional Risks: None Diabetes: No  How often do you need to have someone help you when you read instructions, pamphlets, or other written materials from your doctor or pharmacy?: 1 - Never What is the last grade level you completed in school?: 12th grade + some college courses  Interpreter Needed?: No  Comments: pt is a widow and lives alone Information entered by :: LPinson, LPN  Past Medical History:  Diagnosis Date  . Allergy    allergic rhinitis  . Anxiety   . Arthritis    OA knee replacement  . GERD (gastroesophageal reflux disease)   . Hyperlipidemia   . Hypertension   . Hypothyroid   . Reactive airways  dysfunction syndrome ([place])   . Skin lesions, generalized    squamous cell skin lesions and basal cell skin lesions   Past Surgical History:  Procedure Laterality Date  . ABDOMINAL HYSTERECTOMY  1990s   hyst with oophrectomy for B9lesion/cyst and prolapse  . JOINT REPLACEMENT  05/2009   left total knee replacement  . recocele     and cystocele repair  . tsa     Family History  Problem Relation Age of Onset  . Hypertension Mother   . Stroke Mother   . Depression Mother   . Alzheimer's disease Mother   . Heart disease Father   . Hypertension Sister    Social History   Socioeconomic History  . Marital status: Widowed    Spouse name: Not on file  . Number of children: Not on file  . Years of education: Not on file  . Highest education level: Not on file  Occupational History  . Not on file  Social Needs  . Financial resource strain: Not on file  . Food insecurity:    Worry: Not on file    Inability: Not on file  . Transportation needs:    Medical: Not on file    Non-medical: Not on file  Tobacco Use  . Smoking status: Never Smoker  . Smokeless tobacco: Never Used  Substance and Sexual Activity  . Alcohol use: No    Alcohol/week: 0.0 oz  .  Drug use: No  . Sexual activity: Never  Lifestyle  . Physical activity:    Days per week: Not on file    Minutes per session: Not on file  . Stress: Not on file  Relationships  . Social connections:    Talks on phone: Not on file    Gets together: Not on file    Attends religious service: Not on file    Active member of club or organization: Not on file    Attends meetings of clubs or organizations: Not on file    Relationship status: Not on file  Other Topics Concern  . Not on file  Social History Narrative  . Not on file    Outpatient Encounter Medications as of 07/02/2017  Medication Sig  . acyclovir (ZOVIRAX) 800 MG tablet TAKE 1 TABLET BY MOUTH 3 TIMES DAILY FOR 3 DAYS FLARES  . amoxicillin (AMOXIL) 500 MG  capsule Take 500 mg by mouth as directed.  . docusate sodium (COLACE) 100 MG capsule Take 100 mg by mouth 2 (two) times daily.  Marland Kitchen esomeprazole (NEXIUM) 40 MG capsule TAKE ONE CAPSULE BY MOUTH EVERY MORNING BEFORE BREAKFAST  . estradiol (VIVELLE-DOT) 0.0375 MG/24HR PLACE 1 PATCH ONTO THE SKIN 2 (TWO) TIMES A WEEK.  . Glucosamine-Chondroitin 750-600 MG TABS Take 1 tablet by mouth 2 (two) times daily.    Marland Kitchen lisinopril (PRINIVIL,ZESTRIL) 5 MG tablet Take 1 tablet (5 mg total) by mouth daily.  . montelukast (SINGULAIR) 10 MG tablet Take 10 mg by mouth daily.   . Multiple Vitamin (MULTIVITAMIN) tablet Take 1 tablet by mouth daily.    . mupirocin ointment (BACTROBAN) 2 % Apply 1 application topically daily as needed.  . Olopatadine HCl (PATANASE NA) Place 2 sprays into the nose 2 (two) times daily.  . potassium chloride SA (KLOR-CON M20) 20 MEQ tablet TAKE 1 TABLET (20 MEQ TOTAL) BY MOUTH ONCE DAILY  . PROAIR HFA 108 (90 BASE) MCG/ACT inhaler USE 2 PUFFS EVERY 4 HOURS AS NEEDED FOR WHEEZING  . QVAR REDIHALER 80 MCG/ACT inhaler   . SYNTHROID 112 MCG tablet Take 112 mcg by mouth daily.  Marland Kitchen tiZANidine (ZANAFLEX) 4 MG tablet Take 1 tablet (4 mg total) by mouth every 6 (six) hours as needed.  . traMADol (ULTRAM) 50 MG tablet TAKE 2 TABLETS BY MOUTH EVERY 8 HOURS AS NEEDED  . triamterene-hydrochlorothiazide (MAXZIDE-25) 37.5-25 MG tablet Take 2 tablets by mouth daily.  . verapamil (CALAN-SR) 240 MG CR tablet TAKE 1 TABLET (240 MG TOTAL) BY MOUTH 2 (TWO) TIMES DAILY.  . [DISCONTINUED] estradiol (CLIMARA - DOSED IN MG/24 HR) 0.0375 mg/24hr patch PLACE 1 PATCH ONTO THE SKIN ONCE WEEKLY  . [DISCONTINUED] Olopatadine HCl 0.6 % SOLN   . [DISCONTINUED] SYNTHROID 100 MCG tablet TAKE 1 TABLET EVERY DAY BY MOUTH   No facility-administered encounter medications on file as of 07/02/2017.     Activities of Daily Living In your present state of health, do you have any difficulty performing the following activities:  07/02/2017  Hearing? N  Vision? N  Difficulty concentrating or making decisions? N  Walking or climbing stairs? N  Dressing or bathing? N  Doing errands, shopping? N  Preparing Food and eating ? N  Using the Toilet? N  In the past six months, have you accidently leaked urine? N  Do you have problems with loss of bowel control? N  Managing your Medications? N  Managing your Finances? N  Housekeeping or managing your Housekeeping? N  Some  recent data might be hidden    Patient Care Team: [place], Wynelle Fanny, MD as PCP - Silvano Rusk, MD as Consulting Physician (Ophthalmology)   Assessment:   This is a routine wellness examination for [place].   Hearing Screening   125Hz  250Hz  500Hz  1000Hz  2000Hz  3000Hz  4000Hz  6000Hz  8000Hz   Right ear:   40 40 40  40    Left ear:   40 0 40  0    Vision Screening Comments: Vision exam on May 24, 2017 with Dr. Claudean Kinds   Exercise Activities and Dietary recommendations Current Exercise Habits: The patient does not participate in regular exercise at present, Exercise limited by: None identified  Goals    . Patient Stated     Starting 07/02/2017, I will continue to take medications as prescribed.        Fall Risk Fall Risk  07/02/2017 06/23/2016 06/22/2015 05/27/2014 02/21/2013  Falls in the past year? Yes No No No No  Comment lost balance due to foot being numb; multiple bruises - - - -  Number falls in past yr: 1 - - - -  Injury with Fall? Yes - - - -   Depression Screen PHQ 2/9 Scores 07/02/2017 06/23/2016 06/22/2015 05/27/2014  PHQ - 2 Score 0 0 0 0  PHQ- 9 Score 0 - - -    Cognitive Function MMSE - Mini Mental State Exam 07/02/2017 06/23/2016 06/22/2015  Orientation to time 5 5 5   Orientation to Place 5 5 5   Registration 3 3 3   Attention/ Calculation 0 0 0  Recall 3 3 3   Language- name 2 objects 0 0 0  Language- repeat 1 1 1   Language- follow 3 step command 3 3 3   Language- read & follow direction 0 0 0  Write a sentence 0 0 0  Copy  design 0 0 0  Total score 20 20 20      PLEASE NOTE: A Mini-Cog screen was completed. Maximum score is 20. A value of 0 denotes this part of [place] MMSE was not completed or the patient failed this part of the Mini-Cog screening.   Mini-Cog Screening Orientation to Time - Max 5 pts Orientation to Place - Max 5 pts Registration - Max 3 pts Recall - Max 3 pts Language Repeat - Max 1 pts Language Follow 3 Step Command - Max 3 pts     Immunization History  Administered Date(s) Administered  . Influenza Split 10/18/2010  . Influenza Whole 11/30/2006, 10/28/2008, 10/27/2009  . Influenza, High Dose Seasonal PF 11/11/2015, 10/05/2016  . Influenza-Unspecified 10/07/2012, 08/30/2013, 10/01/2014  . Pneumococcal Conjugate-13 05/27/2014  . Pneumococcal Polysaccharide-23 12/04/2011  . Td 12/04/2011  . Zoster 10/05/2010     Screening Tests Health Maintenance  Topic Date Due  . INFLUENZA VACCINE  08/16/2017  . MAMMOGRAM  11/09/2017  . COLONOSCOPY  08/02/2020  . TETANUS/TDAP  12/03/2021  . DEXA SCAN  Completed  . Hepatitis C Screening  Completed  . PNA vac Low Risk Adult  Completed    Plan:     I have personally reviewed, addressed, and noted the following in the patient's chart:  A. Medical and social history B. Use of alcohol, tobacco or illicit drugs  C. Current medications and supplements D. Functional ability and status E.  Nutritional status F.  Physical activity G. Advance directives H. List of other physicians I.  Hospitalizations, surgeries, and ER visits in previous 12 months J.  Vitals K. Screenings to include hearing, vision, cognitive, depression  L. Referrals and appointments - none  In addition, I have reviewed and discussed with patient certain preventive protocols, quality metrics, and best practice recommendations. A written personalized care plan for preventive services as well as general preventive health recommendations were provided to patient.  See  attached scanned questionnaire for additional information.   Signed,   Lindell Noe, MHA, BS, LPN Health Coach

## 2017-07-02 NOTE — Patient Instructions (Signed)
Amber Miles , Thank you for taking time to come for your Medicare Wellness Visit. I appreciate your ongoing commitment to your health goals. Please review the following plan we discussed and let me know if I can assist you in the future.   These are the goals we discussed: Goals    . Patient Stated     Starting 07/02/2017, I will continue to take medications as prescribed.        This is a list of the screening recommended for you and due dates:  Health Maintenance  Topic Date Due  . Flu Shot  08/16/2017  . Mammogram  11/09/2017  . Colon Cancer Screening  08/02/2020  . Tetanus Vaccine  12/03/2021  . DEXA scan (bone density measurement)  Completed  .  Hepatitis C: One time screening is recommended by Center for Disease Control  (CDC) for  adults born from [birth year] through 1966.   Completed  . Pneumonia vaccines  Completed   Preventive Care for Adults  A healthy lifestyle and preventive care can promote health and wellness. Preventive health guidelines for adults include the following key practices.  . A routine yearly physical is a good way to check with your health care provider about your health and preventive screening. It is a chance to share any concerns and updates on your health and to receive a thorough exam.  . Visit your dentist for a routine exam and preventive care every 6 months. Brush your teeth twice a day and floss once a day. Good oral hygiene prevents tooth decay and gum disease.  . The frequency of eye exams is based on your age, health, family medical history, use  of contact lenses, and other factors. Follow your health care provider's recommendations for frequency of eye exams.  . Eat a healthy diet. Foods like vegetables, fruits, whole grains, low-fat dairy products, and lean protein foods contain the nutrients you need without too many calories. Decrease your intake of foods high in solid fats, added sugars, and salt. Eat the right amount of calories for you. Get  information about a proper diet from your health care provider, if necessary.  . Regular physical exercise is one of the most important things you can do for your health. Most adults should get at least 150 minutes of moderate-intensity exercise (any activity that increases your heart rate and causes you to sweat) each week. In addition, most adults need muscle-strengthening exercises on 2 or more days a week.  Silver Sneakers may be a benefit available to you. To determine eligibility, you may visit the website: www.silversneakers.com or contact program at 416-033-7685 Mon-Fri between 8AM-8PM.   . Maintain a healthy weight. The body mass index (BMI) is a screening tool to identify possible weight problems. It provides an estimate of body fat based on height and weight. Your health care provider can find your BMI and can help you achieve or maintain a healthy weight.   For adults 20 years and older: ? A BMI below 18.5 is considered underweight. ? A BMI of 18.5 to 24.9 is normal. ? A BMI of 25 to 29.9 is considered overweight. ? A BMI of 30 and above is considered obese.   . Maintain normal blood lipids and cholesterol levels by exercising and minimizing your intake of saturated fat. Eat a balanced diet with plenty of fruit and vegetables. Blood tests for lipids and cholesterol should begin at age 32 and be repeated every 5 years. If your lipid or cholesterol  levels are high, you are over 50, or you are at high risk for heart disease, you may need your cholesterol levels checked more frequently. Ongoing high lipid and cholesterol levels should be treated with medicines if diet and exercise are not working.  . If you smoke, find out from your health care provider how to quit. If you do not use tobacco, please do not start.  . If you choose to drink alcohol, please do not consume more than 2 drinks per day. One drink is considered to be 12 ounces (355 mL) of beer, 5 ounces (148 mL) of wine, or 1.5  ounces (44 mL) of liquor.  . If you are 62-50 years old, ask your health care provider if you should take aspirin to prevent strokes.  . Use sunscreen. Apply sunscreen liberally and repeatedly throughout the day. You should seek shade when your shadow is shorter than you. Protect yourself by wearing long sleeves, pants, a wide-brimmed hat, and sunglasses year round, whenever you are outdoors.  . Once a month, do a whole body skin exam, using a mirror to look at the skin on your back. Tell your health care provider of new moles, moles that have irregular borders, moles that are larger than a pencil eraser, or moles that have changed in shape or color.

## 2017-07-02 NOTE — Progress Notes (Signed)
PCP notes:   Health maintenance:  No gaps identified.   Abnormal screenings:   Hearing - failed  Hearing Screening   125Hz  250Hz  500Hz  1000Hz  2000Hz  3000Hz  4000Hz  6000Hz  8000Hz   Right ear:   40 40 40  40    Left ear:   40 0 40  0     Fall risk - hx of single fall Fall Risk  07/02/2017 06/23/2016 06/22/2015 05/27/2014 02/21/2013  Falls in the past year? Yes No No No No  Comment lost balance due to foot being numb; multiple bruises - - - -  Number falls in past yr: 1 - - - -  Injury with Fall? Yes - - - -   Patient concerns:   None  Nurse concerns:  None  Next PCP appt:   07/16/17 @ 1430  I reviewed health advisor's note, was available for consultation, and agree with documentation and plan. Loura Pardon MD

## 2017-07-03 ENCOUNTER — Other Ambulatory Visit: Payer: Self-pay | Admitting: Family Medicine

## 2017-07-04 ENCOUNTER — Other Ambulatory Visit: Payer: Self-pay

## 2017-07-04 MED ORDER — ESOMEPRAZOLE MAGNESIUM 40 MG PO CPDR: DELAYED_RELEASE_CAPSULE | ORAL | 3 refills | Status: DC

## 2017-07-04 NOTE — Telephone Encounter (Signed)
Last labs 07/02/2017. Upcoming 7/1 appt

## 2017-07-09 ENCOUNTER — Ambulatory Visit: Payer: Medicare Other | Admitting: Family Medicine

## 2017-07-16 ENCOUNTER — Encounter: Payer: Self-pay | Admitting: Family Medicine

## 2017-07-16 ENCOUNTER — Ambulatory Visit (INDEPENDENT_AMBULATORY_CARE_PROVIDER_SITE_OTHER): Payer: Medicare Other | Admitting: Family Medicine

## 2017-07-16 VITALS — BP 126/72 | HR 60 | Temp 98.4°F | Ht 67.0 in | Wt 230.0 lb

## 2017-07-16 DIAGNOSIS — Z6836 Body mass index (BMI) 36.0-36.9, adult: Secondary | ICD-10-CM

## 2017-07-16 DIAGNOSIS — R739 Hyperglycemia, unspecified: Secondary | ICD-10-CM

## 2017-07-16 DIAGNOSIS — N951 Menopausal and female climacteric states: Secondary | ICD-10-CM

## 2017-07-16 DIAGNOSIS — E039 Hypothyroidism, unspecified: Secondary | ICD-10-CM | POA: Diagnosis not present

## 2017-07-16 DIAGNOSIS — I10 Essential (primary) hypertension: Secondary | ICD-10-CM

## 2017-07-16 DIAGNOSIS — E7800 Pure hypercholesterolemia, unspecified: Secondary | ICD-10-CM | POA: Diagnosis not present

## 2017-07-16 DIAGNOSIS — D329 Benign neoplasm of meninges, unspecified: Secondary | ICD-10-CM | POA: Diagnosis not present

## 2017-07-16 MED ORDER — ESOMEPRAZOLE MAGNESIUM 40 MG PO CPDR: DELAYED_RELEASE_CAPSULE | ORAL | 3 refills | Status: DC

## 2017-07-16 MED ORDER — LISINOPRIL 5 MG PO TABS: 5.0000 mg | ORAL_TABLET | Freq: Every day | ORAL | 3 refills | Status: DC

## 2017-07-16 MED ORDER — TRIAMTERENE-HCTZ 37.5-25 MG PO TABS: 2.0000 | ORAL_TABLET | Freq: Every day | ORAL | 3 refills | Status: DC

## 2017-07-16 MED ORDER — POTASSIUM CHLORIDE CRYS ER 20 MEQ PO TBCR: EXTENDED_RELEASE_TABLET | ORAL | 3 refills | Status: DC

## 2017-07-16 MED ORDER — VERAPAMIL HCL ER 240 MG PO TBCR: EXTENDED_RELEASE_TABLET | ORAL | 3 refills | Status: DC

## 2017-07-16 NOTE — Patient Instructions (Addendum)
Try to get 1200-1500 mg of calcium per day with at least 1000 iu of vitamin D - for bone health  You can stop the multi vitamin and take the calcium plus D instead   Keep working on healthy diet and exercise and weight loss Stay active and busy also   No change in medicines

## 2017-07-16 NOTE — Progress Notes (Signed)
Subjective:    Patient ID: Amber Miles, female    DOB: June 19, 1945, 72 y.o.   MRN: [id number]  HPI  Here for annual f/u of chronic health problems  Feeling ok overall   Looking after her great grandson and enjoys it    Wt Readings from Last 3 Encounters:  07/16/17 230 lb (104.3 kg)  07/02/17 229 lb 8 oz (104.1 kg)  01/04/17 234 lb (106.1 kg)  trying to eat right and take care of herself  Walking more as well /stays active  Working on weight loss  36.02 kg/m   amw rev 6/17 Missed several tones in L ear -not getting worse since pt (has tinnitus and meningioma) - stable  One fall due to loss of balance (some bruising) - no fractures  Is a lot more careful now   Mammogram 10/18 - R breast mass and bx recommended (benign) Self breast exam- no new lumps or changes  Est patch-for hot flashes (her gyn told her to continue it for vaginal atrophy)    Colonoscopy 7/17 with 5 y recall   dexa 3/15 nl bmd No fractures  She is taking mvi  Drinks a lot of milk    zostavax 9/12  bp is stable today  No cp or palpitations or headaches or edema  No side effects to medicines  BP Readings from Last 3 Encounters:  07/16/17 126/72  07/02/17 130/76  01/04/17 132/66      Hypothyroidism  Pt has no clinical changes No change in energy level/ hair or skin/ edema and no tremor Lab Results  Component Value Date   TSH 2.15 07/02/2017     Hyperglycemia Lab Results  Component Value Date   HGBA1C 6.0 07/02/2017  past 5.7  She is mindful about sweets and sugar   Hyperlipidemia  Lab Results  Component Value Date   CHOL 177 07/02/2017   CHOL 180 06/23/2016   CHOL 182 06/15/2015   Lab Results  Component Value Date   HDL 44.00 07/02/2017   HDL 42.40 06/23/2016   HDL 41.60 06/15/2015   Lab Results  Component Value Date   LDLCALC 101 (H) 07/02/2017   LDLCALC 106 (H) 06/23/2016   LDLCALC 103 (H) 05/15/2014   Lab Results  Component Value Date   TRIG 157.0 (H) 07/02/2017     TRIG 157.0 (H) 06/23/2016   TRIG 209.0 (H) 06/15/2015   Lab Results  Component Value Date   CHOLHDL 4 07/02/2017   CHOLHDL 4 06/23/2016   CHOLHDL 4 06/15/2015   Lab Results  Component Value Date   LDLDIRECT 105.0 06/15/2015   Stable   Patient Active Problem List   Diagnosis Date Noted  . Fall 01/04/2017  . Contusion of right knee 01/04/2017  . Vasomotor symptoms due to menopause 06/26/2016  . Atrophic vaginitis 09/01/2015  . Pedal edema 05/27/2014  . Estrogen deficiency 02/21/2013  . Encounter for Medicare annual wellness exam 02/21/2013  . Low back pain potentially associated with radiculopathy 10/11/2012  . 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 ALLERGIC RHINITIS 01/28/2010  . Hyperglycemia 07/30/2009  . ANXIETY 11/30/2008  . Hyperlipidemia 04/13/2008  . Hypothyroidism 01/07/2008  . Obesity 01/07/2008  . HYPERTENSION, BENIGN 01/07/2008  . 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 Allergy    allergic rhinitis  . Anxiety   . Arthritis    OA knee replacement  . GERD (gastroesophageal reflux disease)   . Hyperlipidemia   . Hypertension   .  Hypothyroid   . Reactive airways dysfunction syndrome ([place])   . Skin lesions, generalized    squamous cell skin lesions and basal cell skin lesions   Past Surgical History:  Procedure Laterality Date  . ABDOMINAL HYSTERECTOMY  1990s   hyst with oophrectomy for B9lesion/cyst and prolapse  . JOINT REPLACEMENT  05/2009   left total knee replacement  . recocele     and cystocele repair  . tsa     Social History   Tobacco Use  . Smoking status: Never Smoker  . Smokeless tobacco: Never Used  Substance Use Topics  . Alcohol use: No    Alcohol/week: 0.0 oz  . Drug use: No   Family History  Problem Relation Age of Onset  . Hypertension Mother   . Stroke Mother   . Depression Mother   . Alzheimer's disease Mother   . Heart disease Father   . Hypertension Sister    Allergies   Allergen Reactions  . Neosporin [Neomycin-Polymyxin-Gramicidin] Other (See Comments)    blisters  . Tape Swelling    Itching and redness Pt said severe reaction to surgical tape.? latex   Current Outpatient Medications on File Prior to Visit  Medication Sig Dispense Refill  . acyclovir (ZOVIRAX) 800 MG tablet TAKE 1 TABLET BY MOUTH 3 TIMES DAILY FOR 3 DAYS FLARES  2  . amoxicillin (AMOXIL) 500 MG capsule Take 500 mg by mouth as directed.    . docusate sodium (COLACE) 100 MG capsule Take 100 mg by mouth 2 (two) times daily.    Marland Kitchen estradiol (VIVELLE-DOT) 0.0375 MG/24HR PLACE 1 PATCH ONTO THE SKIN 2 (TWO) TIMES A WEEK.  11  . Glucosamine-Chondroitin 750-600 MG TABS Take 1 tablet by mouth 2 (two) times daily.      . montelukast (SINGULAIR) 10 MG tablet Take 10 mg by mouth daily.     . Multiple Vitamin (MULTIVITAMIN) tablet Take 1 tablet by mouth daily.      . mupirocin ointment (BACTROBAN) 2 % Apply 1 application topically daily as needed. 22 g 1  . Olopatadine HCl (PATANASE NA) Place 2 sprays into the nose 2 (two) times daily.    Marland Kitchen PROAIR HFA 108 (90 BASE) MCG/ACT inhaler USE 2 PUFFS EVERY 4 HOURS AS NEEDED FOR WHEEZING 8.5 each 4  . QVAR REDIHALER 80 MCG/ACT inhaler     . SYNTHROID 112 MCG tablet Take 112 mcg by mouth daily.  11  . tiZANidine (ZANAFLEX) 4 MG tablet Take 1 tablet (4 mg total) by mouth every 6 (six) hours as needed. 60 tablet 0  . traMADol (ULTRAM) 50 MG tablet TAKE 2 TABLETS BY MOUTH EVERY 8 HOURS AS NEEDED 90 tablet 0   No current facility-administered medications on file prior to visit.     Review of Systems  Constitutional: Negative for activity change, appetite change, fatigue, fever and unexpected weight change.  HENT: Negative for congestion, ear pain, rhinorrhea, sinus pressure and sore throat.   Eyes: Negative for pain, redness and visual disturbance.  Respiratory: Negative for cough, shortness of breath and wheezing.   Cardiovascular: Negative for chest pain and  palpitations.  Gastrointestinal: Negative for abdominal pain, blood in stool, constipation and diarrhea.  Endocrine: Negative for polydipsia and polyuria.  Genitourinary: Negative for dysuria, frequency and urgency.  Musculoskeletal: Negative for arthralgias, back pain and myalgias.  Skin: Negative for pallor and rash.  Allergic/Immunologic: Negative for environmental allergies.  Neurological: Negative for dizziness, syncope and headaches.  Hematological: Negative for adenopathy. Does  not bruise/bleed easily.  Psychiatric/Behavioral: Negative for decreased concentration and dysphoric mood. The patient is not nervous/anxious.        Objective:   Physical Exam  Constitutional: She appears well-developed and well-nourished. No distress.  obese and well appearing   HENT:  Head: Normocephalic and atraumatic.  Right Ear: External ear normal.  Left Ear: External ear normal.  Mouth/Throat: Oropharynx is clear and moist.  Eyes: Pupils are equal, round, and reactive to light. Conjunctivae and EOM are normal. No scleral icterus.  Neck: Normal range of motion. Neck supple. No JVD present. Carotid bruit is not present. No thyromegaly present.  Cardiovascular: Normal rate, regular rhythm, normal heart sounds and intact distal pulses. Exam reveals no gallop.  Pulmonary/Chest: Effort normal and breath sounds normal. No respiratory distress. She has no wheezes. She exhibits no tenderness. No breast tenderness, discharge or bleeding.  Abdominal: Soft. Bowel sounds are normal. She exhibits no distension, no abdominal bruit and no mass. There is no tenderness.  Genitourinary: No breast tenderness, discharge or bleeding.  Genitourinary Comments: Breast exam: No mass, nodules, thickening, tenderness, bulging, retraction, inflamation, nipple discharge or skin changes noted.  No axillary or clavicular LA.      Musculoskeletal: Normal range of motion. She exhibits no edema or tenderness.  Lymphadenopathy:     She has no cervical adenopathy.  Neurological: She is alert. She has normal reflexes. No cranial nerve deficit. She exhibits normal muscle tone. Coordination normal.  Skin: Skin is warm and dry. No rash noted. No erythema. No pallor.  Solar lentigines diffusely   Psychiatric: She has a normal mood and affect.          Assessment & Plan:   Problem List Items Addressed This Visit      Cardiovascular and Mediastinum   HYPERTENSION, BENIGN - Primary    bp in fair control at this time  BP Readings from Last 1 Encounters:  07/16/17 126/72   No changes needed Most recent labs reviewed  Disc lifstyle change with low sodium diet and exercise        Relevant Medications   lisinopril (PRINIVIL,ZESTRIL) 5 MG tablet   triamterene-hydrochlorothiazide (MAXZIDE-25) 37.5-25 MG tablet   verapamil (CALAN-SR) 240 MG CR tablet     Endocrine   Hypothyroidism    Hypothyroidism  Pt has no clinical changes No change in energy level/ hair or skin/ edema and no tremor Lab Results  Component Value Date   TSH 2.15 07/02/2017            Nervous and Auditory   Meningioma (HCC)    Monitored closely -no growth        Other   Hyperglycemia    Lab Results  Component Value Date   HGBA1C 6.0 07/02/2017   disc imp of low glycemic diet and wt loss to prevent DM2       Hyperlipidemia    Disc goals for lipids and reasons to control them Rev last labs with pt Rev low sat fat diet in detail  Stable       Relevant Medications   lisinopril (PRINIVIL,ZESTRIL) 5 MG tablet   triamterene-hydrochlorothiazide (MAXZIDE-25) 37.5-25 MG tablet   verapamil (CALAN-SR) 240 MG CR tablet   Obesity    Discussed how this problem influences overall health and the risks it imposes  Reviewed plan for weight loss with lower calorie diet (via better food choices and also portion control or program like weight watchers) and exercise building up to or more than 30  minutes 5 days per week including some aerobic  activity         Vasomotor symptoms due to menopause    Continues estrogen patch for hot flashes and vaginal atrophy  Does not want to stop it  Understands breast cancer risks

## 2017-07-17 DIAGNOSIS — D329 Benign neoplasm of meninges, unspecified: Secondary | ICD-10-CM | POA: Insufficient documentation

## 2017-07-17 NOTE — Assessment & Plan Note (Signed)
Monitored closely -no growth

## 2017-07-17 NOTE — Assessment & Plan Note (Signed)
bp in fair control at this time  BP Readings from Last 1 Encounters:  07/16/17 126/72   No changes needed Most recent labs reviewed  Disc lifstyle change with low sodium diet and exercise

## 2017-07-17 NOTE — Assessment & Plan Note (Signed)
Discussed how this problem influences overall health and the risks it imposes  Reviewed plan for weight loss with lower calorie diet (via better food choices and also portion control or program like weight watchers) and exercise building up to or more than 30 minutes 5 days per week including some aerobic activity    

## 2017-07-17 NOTE — Assessment & Plan Note (Signed)
Disc goals for lipids and reasons to control them Rev last labs with pt Rev low sat fat diet in detail Stable  

## 2017-07-17 NOTE — Assessment & Plan Note (Signed)
Continues estrogen patch for hot flashes and vaginal atrophy  Does not want to stop it  Understands breast cancer risks

## 2017-07-17 NOTE — Assessment & Plan Note (Signed)
Lab Results  Component Value Date   HGBA1C 6.0 07/02/2017   disc imp of low glycemic diet and wt loss to prevent DM2

## 2017-07-17 NOTE — Assessment & Plan Note (Signed)
Hypothyroidism  Pt has no clinical changes No change in energy level/ hair or skin/ edema and no tremor Lab Results  Component Value Date   TSH 2.15 07/02/2017

## 2017-08-14 ENCOUNTER — Other Ambulatory Visit: Payer: Self-pay | Admitting: Neurosurgery

## 2017-08-14 DIAGNOSIS — D320 Benign neoplasm of cerebral meninges: Secondary | ICD-10-CM

## 2017-08-28 ENCOUNTER — Ambulatory Visit (INDEPENDENT_AMBULATORY_CARE_PROVIDER_SITE_OTHER): Payer: Medicare Other | Admitting: Podiatry

## 2017-08-28 ENCOUNTER — Encounter: Payer: Self-pay | Admitting: Podiatry

## 2017-08-28 DIAGNOSIS — M79676 Pain in unspecified toe(s): Secondary | ICD-10-CM | POA: Diagnosis not present

## 2017-08-28 DIAGNOSIS — B351 Tinea unguium: Secondary | ICD-10-CM | POA: Diagnosis not present

## 2017-08-30 NOTE — Progress Notes (Signed)
   SUBJECTIVE Patient presents to office today complaining of elongated, thickened nails that cause pain while ambulating in shoes. She is unable to trim her own nails. Patient is here for further evaluation and treatment.  Past Medical History:  Diagnosis Date  . Allergy    allergic rhinitis  . Anxiety   . Arthritis    OA knee replacement  . GERD (gastroesophageal reflux disease)   . Hyperlipidemia   . Hypertension   . Hypothyroid   . Reactive airways dysfunction syndrome ([place])   . Skin lesions, generalized    squamous cell skin lesions and basal cell skin lesions    OBJECTIVE General Patient is awake, alert, and oriented x 3 and in no acute distress. Derm Skin is dry and supple bilateral. Negative open lesions or macerations. Remaining integument unremarkable. Nails are tender, long, thickened and dystrophic with subungual debris, consistent with onychomycosis, 1-5 bilateral. No signs of infection noted. Vasc  DP and PT pedal pulses palpable bilaterally. Temperature gradient within normal limits.  Neuro Epicritic and protective threshold sensation grossly intact bilaterally.  Musculoskeletal Exam No symptomatic pedal deformities noted bilateral. Muscular strength within normal limits.  ASSESSMENT 1. Onychodystrophic nails 1-5 bilateral with hyperkeratosis of nails.  2. Onychomycosis of nail due to dermatophyte bilateral 3. Pain in foot bilateral  PLAN OF CARE 1. Patient evaluated today.  2. Instructed to maintain good pedal hygiene and foot care.  3. Mechanical debridement of nails 1-5 bilaterally performed using a nail nipper. Filed with dremel without incident.  4. Return to clinic in 3 mos.   Going to [place] November 8-17 for a vacation.   Edrick Kins, DPM [facility]  Dr. Edrick Kins, [address]                                        [place],  [address]                Office [phone]  Fax [phone]

## 2017-09-05 ENCOUNTER — Other Ambulatory Visit: Payer: Self-pay | Admitting: *Deleted

## 2017-09-05 MED ORDER — TIZANIDINE HCL 4 MG PO TABS: 4.0000 mg | ORAL_TABLET | Freq: Four times a day (QID) | ORAL | 0 refills | Status: DC | PRN

## 2017-09-05 NOTE — Telephone Encounter (Signed)
Last filled on 05/25/17 #60 tabs with 0 refills, CPE and AWV scheduled on 07/15/18, please advise

## 2017-09-07 ENCOUNTER — Ambulatory Visit
Admission: RE | Admit: 2017-09-07 | Discharge: 2017-09-07 | Disposition: A | Discharge status: Home / Self Care | Payer: Medicare Other | Source: Ambulatory Visit | Attending: Neurosurgery | Admitting: Neurosurgery

## 2017-09-07 DIAGNOSIS — D320 Benign neoplasm of cerebral meninges: Secondary | ICD-10-CM | POA: Insufficient documentation

## 2017-09-07 MED ORDER — GADOBENATE DIMEGLUMINE 529 MG/ML IV SOLN
20.0000 mL | Freq: Once | INTRAVENOUS | Status: AC | PRN
  Administered 2017-09-07: 20 mL via INTRAVENOUS

## 2017-09-10 DIAGNOSIS — I10 Essential (primary) hypertension: Secondary | ICD-10-CM | POA: Diagnosis not present

## 2017-09-10 DIAGNOSIS — Z6836 Body mass index (BMI) 36.0-36.9, adult: Secondary | ICD-10-CM | POA: Diagnosis not present

## 2017-09-10 DIAGNOSIS — D320 Benign neoplasm of cerebral meninges: Secondary | ICD-10-CM | POA: Diagnosis not present

## 2017-10-02 ENCOUNTER — Other Ambulatory Visit: Payer: Self-pay | Admitting: Family Medicine

## 2017-10-02 DIAGNOSIS — Z1231 Encounter for screening mammogram for malignant neoplasm of breast: Secondary | ICD-10-CM

## 2017-10-04 DIAGNOSIS — J300 Vasomotor rhinitis: Secondary | ICD-10-CM | POA: Diagnosis not present

## 2017-10-04 DIAGNOSIS — J4530 Mild persistent asthma, uncomplicated: Secondary | ICD-10-CM | POA: Diagnosis not present

## 2017-10-04 DIAGNOSIS — H1045 Other chronic allergic conjunctivitis: Secondary | ICD-10-CM | POA: Diagnosis not present

## 2017-10-18 DIAGNOSIS — Z23 Encounter for immunization: Secondary | ICD-10-CM | POA: Diagnosis not present

## 2017-10-25 DIAGNOSIS — E0521 Thyrotoxicosis with toxic multinodular goiter with thyrotoxic crisis or storm: Secondary | ICD-10-CM | POA: Diagnosis not present

## 2017-10-25 DIAGNOSIS — E890 Postprocedural hypothyroidism: Secondary | ICD-10-CM | POA: Diagnosis not present

## 2017-10-25 DIAGNOSIS — I10 Essential (primary) hypertension: Secondary | ICD-10-CM | POA: Diagnosis not present

## 2017-10-25 DIAGNOSIS — D34 Benign neoplasm of thyroid gland: Secondary | ICD-10-CM | POA: Diagnosis not present

## 2017-10-25 DIAGNOSIS — K229 Disease of esophagus, unspecified: Secondary | ICD-10-CM | POA: Diagnosis not present

## 2017-10-25 DIAGNOSIS — E042 Nontoxic multinodular goiter: Secondary | ICD-10-CM | POA: Diagnosis not present

## 2017-10-25 DIAGNOSIS — N958 Other specified menopausal and perimenopausal disorders: Secondary | ICD-10-CM | POA: Diagnosis not present

## 2017-10-25 DIAGNOSIS — J4520 Mild intermittent asthma, uncomplicated: Secondary | ICD-10-CM | POA: Diagnosis not present

## 2017-10-29 DIAGNOSIS — K229 Disease of esophagus, unspecified: Secondary | ICD-10-CM | POA: Diagnosis not present

## 2017-10-29 DIAGNOSIS — E042 Nontoxic multinodular goiter: Secondary | ICD-10-CM | POA: Diagnosis not present

## 2017-10-29 DIAGNOSIS — I10 Essential (primary) hypertension: Secondary | ICD-10-CM | POA: Diagnosis not present

## 2017-10-29 DIAGNOSIS — E0521 Thyrotoxicosis with toxic multinodular goiter with thyrotoxic crisis or storm: Secondary | ICD-10-CM | POA: Diagnosis not present

## 2017-10-29 DIAGNOSIS — N958 Other specified menopausal and perimenopausal disorders: Secondary | ICD-10-CM | POA: Diagnosis not present

## 2017-10-29 DIAGNOSIS — J4520 Mild intermittent asthma, uncomplicated: Secondary | ICD-10-CM | POA: Diagnosis not present

## 2017-10-29 DIAGNOSIS — E890 Postprocedural hypothyroidism: Secondary | ICD-10-CM | POA: Diagnosis not present

## 2017-10-29 DIAGNOSIS — D34 Benign neoplasm of thyroid gland: Secondary | ICD-10-CM | POA: Diagnosis not present

## 2017-11-01 DIAGNOSIS — N958 Other specified menopausal and perimenopausal disorders: Secondary | ICD-10-CM | POA: Diagnosis not present

## 2017-11-01 DIAGNOSIS — Z6836 Body mass index (BMI) 36.0-36.9, adult: Secondary | ICD-10-CM | POA: Diagnosis not present

## 2017-11-01 DIAGNOSIS — D34 Benign neoplasm of thyroid gland: Secondary | ICD-10-CM | POA: Diagnosis not present

## 2017-11-01 DIAGNOSIS — E890 Postprocedural hypothyroidism: Secondary | ICD-10-CM | POA: Diagnosis not present

## 2017-11-01 DIAGNOSIS — I10 Essential (primary) hypertension: Secondary | ICD-10-CM | POA: Diagnosis not present

## 2017-11-01 DIAGNOSIS — E0521 Thyrotoxicosis with toxic multinodular goiter with thyrotoxic crisis or storm: Secondary | ICD-10-CM | POA: Diagnosis not present

## 2017-11-01 DIAGNOSIS — K229 Disease of esophagus, unspecified: Secondary | ICD-10-CM | POA: Diagnosis not present

## 2017-11-01 DIAGNOSIS — J4520 Mild intermittent asthma, uncomplicated: Secondary | ICD-10-CM | POA: Diagnosis not present

## 2017-11-01 DIAGNOSIS — E669 Obesity, unspecified: Secondary | ICD-10-CM | POA: Diagnosis not present

## 2017-11-01 DIAGNOSIS — E042 Nontoxic multinodular goiter: Secondary | ICD-10-CM | POA: Diagnosis not present

## 2017-11-12 ENCOUNTER — Ambulatory Visit
Admission: RE | Admit: 2017-11-12 | Discharge: 2017-11-12 | Disposition: A | Discharge status: Home / Self Care | Payer: Medicare Other | Source: Ambulatory Visit | Attending: Family Medicine | Admitting: Family Medicine

## 2017-11-12 DIAGNOSIS — Z1231 Encounter for screening mammogram for malignant neoplasm of breast: Secondary | ICD-10-CM

## 2017-11-14 ENCOUNTER — Encounter: Payer: Self-pay | Admitting: *Deleted

## 2017-11-19 DIAGNOSIS — D2372 Other benign neoplasm of skin of left lower limb, including hip: Secondary | ICD-10-CM | POA: Diagnosis not present

## 2017-11-19 DIAGNOSIS — D1801 Hemangioma of skin and subcutaneous tissue: Secondary | ICD-10-CM | POA: Diagnosis not present

## 2017-11-19 DIAGNOSIS — L570 Actinic keratosis: Secondary | ICD-10-CM | POA: Diagnosis not present

## 2017-11-19 DIAGNOSIS — Z85828 Personal history of other malignant neoplasm of skin: Secondary | ICD-10-CM | POA: Diagnosis not present

## 2017-11-19 DIAGNOSIS — D225 Melanocytic nevi of trunk: Secondary | ICD-10-CM | POA: Diagnosis not present

## 2017-11-19 DIAGNOSIS — D2271 Melanocytic nevi of right lower limb, including hip: Secondary | ICD-10-CM | POA: Diagnosis not present

## 2017-11-19 DIAGNOSIS — D2272 Melanocytic nevi of left lower limb, including hip: Secondary | ICD-10-CM | POA: Diagnosis not present

## 2017-11-19 DIAGNOSIS — D692 Other nonthrombocytopenic purpura: Secondary | ICD-10-CM | POA: Diagnosis not present

## 2017-11-21 ENCOUNTER — Other Ambulatory Visit: Payer: Self-pay | Admitting: *Deleted

## 2017-11-21 MED ORDER — TIZANIDINE HCL 4 MG PO TABS: 4.0000 mg | ORAL_TABLET | Freq: Four times a day (QID) | ORAL | 0 refills | Status: DC | PRN

## 2017-11-21 NOTE — Telephone Encounter (Signed)
AWV and CPE scheduled on 07/15/18, last filled on 09/05/17 #60 tabs with 0 refills, please advise

## 2017-12-04 ENCOUNTER — Encounter: Payer: Self-pay | Admitting: Podiatry

## 2017-12-04 ENCOUNTER — Ambulatory Visit (INDEPENDENT_AMBULATORY_CARE_PROVIDER_SITE_OTHER): Payer: Medicare Other | Admitting: Podiatry

## 2017-12-04 DIAGNOSIS — B351 Tinea unguium: Secondary | ICD-10-CM

## 2017-12-04 DIAGNOSIS — M79676 Pain in unspecified toe(s): Secondary | ICD-10-CM | POA: Diagnosis not present

## 2017-12-06 NOTE — Progress Notes (Signed)
   SUBJECTIVE Patient presents to office today complaining of elongated, thickened nails that cause pain while ambulating in shoes. She is unable to trim her own nails. Patient is here for further evaluation and treatment.  Past Medical History:  Diagnosis Date  . Allergy    allergic rhinitis  . Anxiety   . Arthritis    OA knee replacement  . GERD (gastroesophageal reflux disease)   . Hyperlipidemia   . Hypertension   . Hypothyroid   . Reactive airways dysfunction syndrome ([place])   . Skin lesions, generalized    squamous cell skin lesions and basal cell skin lesions    OBJECTIVE General Patient is awake, alert, and oriented x 3 and in no acute distress. Derm Skin is dry and supple bilateral. Negative open lesions or macerations. Remaining integument unremarkable. Nails are tender, long, thickened and dystrophic with subungual debris, consistent with onychomycosis, 1-5 bilateral. No signs of infection noted. Vasc  DP and PT pedal pulses palpable bilaterally. Temperature gradient within normal limits.  Neuro Epicritic and protective threshold sensation grossly intact bilaterally.  Musculoskeletal Exam No symptomatic pedal deformities noted bilateral. Muscular strength within normal limits.  ASSESSMENT 1. Onychodystrophic nails 1-5 bilateral with hyperkeratosis of nails.  2. Onychomycosis of nail due to dermatophyte bilateral 3. Pain in foot bilateral  PLAN OF CARE 1. Patient evaluated today.  2. Instructed to maintain good pedal hygiene and foot care.  3. Mechanical debridement of nails 1-5 bilaterally performed using a nail nipper. Filed with dremel without incident.  4. Return to clinic in 3 mos.    Edrick Kins, DPM [facility]  Dr. Edrick Kins, [address]                                        [place], [place] [address]                Office [phone]  Fax [phone]

## 2017-12-14 ENCOUNTER — Encounter: Payer: Self-pay | Admitting: Genetics

## 2017-12-14 ENCOUNTER — Inpatient Hospital Stay: Payer: Medicare Other

## 2017-12-14 ENCOUNTER — Inpatient Hospital Stay: Payer: Medicare Other | Attending: Oncology | Admitting: Genetics

## 2017-12-14 DIAGNOSIS — Z803 Family history of malignant neoplasm of breast: Secondary | ICD-10-CM | POA: Diagnosis not present

## 2017-12-14 DIAGNOSIS — Z8481 Family history of carrier of genetic disease: Secondary | ICD-10-CM | POA: Diagnosis not present

## 2017-12-14 DIAGNOSIS — Z1379 Encounter for other screening for genetic and chromosomal anomalies: Secondary | ICD-10-CM

## 2017-12-14 NOTE — Progress Notes (Addendum)
REFERRING PROVIDER: Self, paternal cousin  PRIMARY PROVIDER:  Tower, Wynelle Fanny, MD  PRIMARY REASON FOR VISIT:  1. Family history of breast cancer   2. Family history of genetic disease carrier     HISTORY OF PRESENT ILLNESS:   Ms. Amber Miles, a 72 y.o. female, was seen for a [facility] cancer genetics consultation at the request of Dr. Glori Bickers due to a family history of an NBN mutation.  Ms. Gora presents to clinic today to discuss the possibility of a hereditary predisposition to cancer, genetic testing, and to further clarify her future cancer risks, as well as potential cancer risks for family members.    Ms. Selkirk is a 72 y.o. female with a history of 'a few basal cell and squamous cell skin carcinomas'.  No other personal history of cancer.   Ms. Guilbault paternal cousin, Lynelle Smoke, was recently diagnosed with triple negative breast cancer at the age of 54.  She underwent genetic testing and was found to have an NBN pathogenic variant.  Ms. Isola presents to discuss testing for this familial NBN mutaiton.    CANCER HISTORY:   No history exists.   HORMONAL RISK FACTORS:  Menarche was at age [age].  First live birth at age 15.  Ovaries intact: yes. 1 in, 1 removed Hysterectomy: yes.  Menopausal status: postmenopausal.  HRT use: current user of patch, since hysterectomy in 1987 years. Colonoscopy: yes; a few polyps, has colonocsopy every5 years. Mammogram within the last year: yes. Number of breast biopsies: 2. Fibrocystic changes and fibroadenoma  Past Medical History:  Diagnosis Date  . Allergy    allergic rhinitis  . Anxiety   . Arthritis    OA knee replacement  . Family history of breast cancer   . Family history of genetic disease carrier    paternal cousin has NBN mutation  . GERD (gastroesophageal reflux disease)   . Hyperlipidemia   . Hypertension   . Hypothyroid   . Reactive airways dysfunction syndrome ([place])   . Skin lesions, generalized    squamous cell skin lesions and basal  cell skin lesions    Past Surgical History:  Procedure Laterality Date  . ABDOMINAL HYSTERECTOMY  1990s   hyst with oophrectomy for B9lesion/cyst and prolapse  . BREAST BIOPSY Bilateral    benign  . JOINT REPLACEMENT  05/2009   left total knee replacement  . recocele     and cystocele repair  . tsa      Social History   Socioeconomic History  . Marital status: Widowed    Spouse name: Not on file  . Number of children: Not on file  . Years of education: Not on file  . Highest education level: Not on file  Occupational History  . Not on file  Social Needs  . Financial resource strain: Not on file  . Food insecurity:    Worry: Not on file    Inability: Not on file  . Transportation needs:    Medical: Not on file    Non-medical: Not on file  Tobacco Use  . Smoking status: Never Smoker  . Smokeless tobacco: Never Used  Substance and Sexual Activity  . Alcohol use: No    Alcohol/week: 0.0 standard drinks  . Drug use: No  . Sexual activity: Never  Lifestyle  . Physical activity:    Days per week: Not on file    Minutes per session: Not on file  . Stress: Not on file  Relationships  . Social  connections:    Talks on phone: Not on file    Gets together: Not on file    Attends religious service: Not on file    Active member of club or organization: Not on file    Attends meetings of clubs or organizations: Not on file    Relationship status: Not on file  Other Topics Concern  . Not on file  Social History Narrative  . Not on file     FAMILY HISTORY:  We obtained a detailed, 4-generation family history.  Significant diagnoses are listed below: Family History  Problem Relation Age of Onset  . Hypertension Mother   . Stroke Mother   . Depression Mother   . Alzheimer's disease Mother   . Heart disease Father   . Cerebral palsy Father   . Hypertension Sister   . Breast cancer Cousin 50       NBN mutation  . Melanoma Maternal Uncle        dx >50    Ms.  Oshana has 2 daughters ages 35 and 29 with no hx of cancer.  She has 5 grandchildren and 1 great-grandchild. Ms. Malkowski has a brother and a sister who have each had a few colon polyps, but no hx of cancer.   Ms. Lampton father: died at 34 due to CP and heart disease.  Paternal Aunts/Uncles: 4 paternal aunts, 2 paternal uncles. No hx of cancer Paternal cousins: 1 paternal cousin, Lynelle Smoke (mentioned above) was dx with triple neg breast cancer at 61 and had genetic testing.  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  A variant of uncertain significance in BRCA1 was also identifed c.77T>C (p.Ile26Thr).   There may also be a paternal cousin who was dx with pancreatic cancer >50.  Paternal grandfather: died in his 25's due to kidney disease.  Paternal grandmother:no hx of cancer, died in her 15's  Ms. Mawson's mother: died with Alzheimer's disease.  Maternal Aunts/Uncles: 2 maternal aunts, 2 maternal uncles.  1 maternal aunt might have had cancer, unk.  A maternal uncle had melanoma on his back dx older than 80.  Maternal cousins: no hx of cance.r  Maternal grandfather: lung cancer, hx of smoking, died at 73 Maternal grandmother:no hx of cancer, died in her 68;s  Patient's maternal ancestors are of [place] descent, and paternal ancestors are of German/Caucasian descent. There is no reported Ashkenazi Jewish ancestry. There is no known consanguinity.  GENETIC COUNSELING ASSESSMENT: ILINA XU is a 72 y.o. female with a family history of a Hereditary Cancer Predisposition Syndrome. We, therefore, discussed and recommended the following at today's visit.   DISCUSSION: We reviewed the characteristics, features and inheritance patterns of hereditary cancer syndromes. We also discussed genetic testing, including the appropriate family members to test, the process of testing, insurance coverage and turn-around-time for results. We discussed the implications of a negative, positive and/or  variant of uncertain significant result.   NBN gene:  The normal function of the NBN gene is to help protect our bodies from cancer; the NBN works with other genes in a complex and is important in the repair of DNA that becomes damaged during normal DNA replication. We all inherit two copies of the NBN gene, one from each parent. If there is a specific change (mutation) in the NBN gene that stops the gene from working properly, this may inhibit the cell's ability to correct mistakes in the DNA, thereby permitting more mutations to accumulate, which can increase individual's  risk to develop cancer in their lifetime.   Peer-reviewed research suggests that women who have inherited a mutation in the NBN gene have a moderately increased lifetime risk of developing breast cancer over that of a woman in the general population.  Current studies suggest there is an approximately 2.7 fold increased risk.  (most of the data on this gene comes from studies of the slavic founder mutation [phone], and it is unknown if other variants confer the same risks). The risk for a 2nd primary breast cancer may be elevated, but is currently not well defined.   It has also been suggested that mutations in NBN may also be associated with prostate and other cancers, however these risks are not well defined.   Men who have inherited an NBN mutation may be at a slightly increased risk for prostate cancer, but data is not conclusive and there are no current recommendations for increased prostate cancer screening over that of the general population.  It is currently unknown how this mutation affects female breast cancer risk.  Further research about the NBN gene is expected to be available in the coming years, so recommendations may evolve.   Recommendations for Cancer Screening:  Current national guidelines [employer] (NCCN) guidelines) suggest the following screening for breast  surveillance:   Annual mammogram and also consider annual breast MRI with contrast at age 25  The evidence is insufficient to recommend a risk reducing mastectomy, unless the risk for breast cancer based on family history warrants discussion.   Additionally, there is no evidence to suggest an increased risk for ovarian cancer.  Nijmegan Breakage Syndrome:  People who have 2 NBN mutations have a more severe, childhood onset genetic condition called Nijmegen breakage syndrome. People with [place] breakage syndrome often have short stature, intelectual disability, increased cancer risk, and other health problems. If 2 people who both carry 1 NBN mutation have children together, there would be a risk for their children to have this disease.   There are also many other cancer predisposition syndromes caused by mutations in several other genes. We discussed with Ms. Lance the options of single site testing for the familial NBN mutation as well as multi-gene panel testing.   Ms. Byington elected to have pan-cancer panel testing. The Multi-Cancer Panel offered by [facility] includes sequencing and/or deletion duplication testing of the following 90 genes: AIP, ALK, APC, ATM, AXIN2, BAP1, BARD1, BLM, BMPR1A, BRCA1, BRCA2, BRIP1, BUB1B, CASR, CDC73, CDH1, CDK4, CDKN1B, CDKN1C, CDKN2A, CEBPA, CHEK2, CTNNA1, DICER1, DIS3L2, EGFR, ENG, EPCAM, FH, FLCN, GALNT12, GATA2, GPC3, GREM1, HOXB13, HRAS, KIT, MAX, MEN1, MET, MITF, MLH1, MLH3, MSH2, MSH3, MSH6, MUTYH, NBN, NF1, NF2, NTHL1, PALB2, PDGFRA, PHOX2B, PMS2, POLD1, POLE, POT1, PRKAR1A, PTCH1, PTEN, RAD50, RAD51C, RAD51D, RB1, RECQL4, RET, RNF43, RPS20, RUNX1, SDHA, SDHAF2, SDHB, SDHC, SDHD, SMAD4, SMARCA4, SMARCB1, SMARCE1, STK11, SUFU, TERC, TERT, TMEM127, TP53, TSC1, TSC2, VHL, WRN, WT1  We discussed that if she is found to have a mutation in one of these genes, it may impact future medical management recommendations such as increased cancer screenings and  consideration of risk reducing surgeries.  A positive result could also have implications for the patient's family members.  A Negative result would mean she did not inherit the familial NBN mutation and that no other hereditary predisposition to cancer was identified. However, genetic testing never can completely rule out the possibility of a hereditary risk for cancer.  There could be mutations that are undetectable by current technology, or in  genes not yet tested or identified to increase cancer risk.    We discussed the potential to find a Variant of Uncertain Significance or VUS.  These are variants that have not yet been identified as pathogenic or benign, and it is unknown if this variant is associated with increased cancer risk or if this is a normal finding.  Most VUS's are reclassified to benign or likely benign.   It should not be used to make medical management decisions. With time, we suspect the lab will determine the significance of any VUS's identified if any.   Based on Ms. Antone's family history of an NBN mutation cancer, she meets medical criteria for genetic testing. Despite that she meets criteria, she may still have an out of pocket cost. The laboratory can provide her with an estimate of her OOP cost.  she was given the contact information for the laboratory if she has further questions.   PLAN: After considering the risks, benefits, and limitations, Ms. Whistler  provided informed consent to pursue genetic testing and the blood sample was sent to [facility] for analysis of the Multi-Cancer panel. Results should be available within approximately 2-3 weeks' time, at which point they will be disclosed by telephone to Ms. Boese, as will any additional recommendations warranted by these results. Ms. Langenderfer will receive a summary of her genetic counseling visit and a copy of her results once available. This information will also be available in Epic. We encouraged Ms. Vanwey to remain in  contact with cancer genetics annually so that we can continuously update the family history and inform her of any changes in cancer genetics and testing that may be of benefit for her family. Ms. Gfeller questions were answered to her satisfaction today. Our contact information was provided should additional questions or concerns arise.  Based on Ms. Pongratz's family history, we recommended her other paternal relatives also have genetic counseling and testing. Ms. Hesch will let us know if we can be of any assistance in coordinating genetic counseling and/or testing for this family member.   Lastly, we encouraged Ms. Revere to remain in contact with cancer genetics annually so that we can continuously update the family history and inform her of any changes in cancer genetics and testing that may be of benefit for this family.   Ms.  Rezek questions were answered to her satisfaction today. Our contact information was provided should additional questions or concerns arise. Thank you for the referral and allowing [place] to share in the care of your patient.   Tana Felts, MS, [facility] Certified Genetic Counselor [email or web address] phone: [phone]  The patient was seen for a total of 35 minutes in face-to-face genetic counseling.  This patient was discussed with Drs. Magrinat, Lindi Adie and/or Burr Medico who agrees with the above.

## 2017-12-31 ENCOUNTER — Telehealth: Payer: Self-pay | Admitting: Genetics

## 2017-12-31 NOTE — Telephone Encounter (Addendum)
Revealed positive NBN result.  We discussed that having 1 mutation in NBN is associated with increased breast cancer risk (and possibly other such as prostate).   I explained that since we last met, there have been new guidelines published by NCCN that suggest  that Amber Miles's specific mutation may not confer as high a risk risk for breast cancer as the common slavic mutation that is most well studied.   Therefore, based on new guidelines, no additional screening/medical management is needed.  We did, however, emphasize the importance of monthly self breast exams, clinical breast exams, and annual mammography.   We discussed risks for her daughters, siblings and other relatives to also carry the NBN mutation.   We also discussed autosomal reproductive risk for Nijmegen-Breakage syndrome.   [name] gives permission to discuss her genetic test results and all information obtained during her genetic counseling session with her daughters Juliane Poot and Wendall Stade.

## 2018-01-02 ENCOUNTER — Ambulatory Visit: Payer: Self-pay | Admitting: Genetics

## 2018-01-02 ENCOUNTER — Encounter: Payer: Self-pay | Admitting: Genetics

## 2018-01-02 ENCOUNTER — Telehealth: Payer: Self-pay | Admitting: Genetics

## 2018-01-02 DIAGNOSIS — Z1379 Encounter for other screening for genetic and chromosomal anomalies: Secondary | ICD-10-CM | POA: Insufficient documentation

## 2018-01-02 DIAGNOSIS — Z8481 Family history of carrier of genetic disease: Secondary | ICD-10-CM

## 2018-01-02 DIAGNOSIS — Z803 Family history of malignant neoplasm of breast: Secondary | ICD-10-CM

## 2018-01-02 NOTE — Progress Notes (Signed)
HPI:  Ms. Olliff was previously seen in the [place] clinic on 12/14/2017 due to a family history of breast cancer and an NBN mutation being identified in her cousin. Please refer to our prior cancer genetics clinic note for more information regarding Ms. Krebbs's medical, social and family histories, and our assessment and recommendations, at the time. Ms. Pendry recent genetic test results were disclosed to her, as well as recommendations warranted by these results. These results and recommendations are discussed in more detail below.  CANCER HISTORY:   No history exists.     FAMILY HISTORY:  We obtained a detailed, 4-generation family history.  Significant diagnoses are listed below: Family History  Problem Relation Age of Onset  . Hypertension Mother   . Stroke Mother   . Depression Mother   . Alzheimer's disease Mother   . Heart disease Father   . Cerebral palsy Father   . Hypertension Sister   . Breast cancer Cousin 50       NBN mutation  . Melanoma Maternal Uncle        dx >50     Ms. Gretzinger has 2 daughters ages 5 and 68 with no hx of cancer.  She has 5 grandchildren and 1 great-grandchild. Ms. Guilfoil has a brother and a sister who have each had a few colon polyps, but no hx of cancer.   Ms. Sterba father: died at 34 due to CP and heart disease.  Paternal Aunts/Uncles: 4 paternal aunts, 2 paternal uncles. No hx of cancer Paternal cousins: 1 paternal cousin, Lynelle Smoke (mentioned above) was dx with triple neg breast cancer at 43 and had genetic testing.  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  A variant of uncertain significance in BRCA1 was also identifed c.77T>C (p.Ile26Thr).   There may also be a paternal cousin who was dx with pancreatic cancer >50.  Paternal grandfather: died in his 42's due to kidney disease.  Paternal grandmother:no hx of cancer, died in her 26's  Ms. Nanney's mother: died with Alzheimer's disease.  Maternal Aunts/Uncles:  2 maternal aunts, 2 maternal uncles.  1 maternal aunt might have had cancer, unk.  A maternal uncle had melanoma on his back dx older than 52.  Maternal cousins: no hx of cance.r  Maternal grandfather: lung cancer, hx of smoking, died at 2 Maternal grandmother:no hx of cancer, died in her 53;s  Patient's maternal ancestors are of [place] descent, and paternal ancestors are of German/Caucasian descent. There is no reported Ashkenazi Jewish ancestry. There is no known consanguinity.  GENETIC TEST RESULTS: Genetic testing was performed through [facility]'s Multi-Cancer Panel and reported out on 12/29/2017.    This test was POSITIVE and revealed she carries the familial NBN pathogenic variant identified in her cousin c.2117C>G (p.Ser706*).  No other mutations were identified.   The Multi-Cancer Panel offered by [facility] includes sequencing and/or deletion duplication testing of the following 90 genes: AIP, ALK, APC, ATM, AXIN2, BAP1, BARD1, BLM, BMPR1A, BRCA1, BRCA2, BRIP1, BUB1B, CASR, CDC73, CDH1, CDK4, CDKN1B, CDKN1C, CDKN2A, CEBPA, CHEK2, CTNNA1, DICER1, DIS3L2, EGFR, ENG, EPCAM, FH, FLCN, GALNT12, GATA2, GPC3, GREM1, HOXB13, HRAS, KIT, MAX, MEN1, MET, MITF, MLH1, MLH3, MSH2, MSH3, MSH6, MUTYH, NBN, NF1, NF2, NTHL1, PALB2, PDGFRA, PHOX2B, PMS2, POLD1, POLE, POT1, PRKAR1A, PTCH1, PTEN, RAD50, RAD51C, RAD51D, RB1, RECQL4, RET, RNF43, RPS20, RUNX1, SDHA, SDHAF2, SDHB, SDHC, SDHD, SMAD4, SMARCA4, SMARCB1, SMARCE1, STK11, SUFU, TERC, TERT, TMEM127, TP53, TSC1, TSC2, VHL, WRN, WT1  The test report will be  scanned into EPIC and will be located under the Molecular Pathology section of the Results Review tab. A portion of the result report is included below for reference.     NBN gene:  The normal function of the NBN gene is to help protect our bodies from cancer; the NBN works with other genes in a complex and is important in the repair of DNA that becomes damaged during normal DNA replication. We all  inherit two copies of the NBN gene, one from each parent. If there is a specific change (mutation) in the NBN gene that stops the gene from working properly, this may inhibit the cell's ability to correct mistakes in the DNA, thereby permitting more mutations to accumulate, which can increase individual's risk to develop cancer in their lifetime.   Peer-reviewed research suggests that women who have inherited the slavic founder mutation in the NBN gene (c.657del5) have amoderatelyincreased lifetime risk of developing breast cancer over that of a woman in the general population. Current studies suggest there is an approximately 2.7 fold increased risk. Most of the data on this gene comes from studies of the slavic founder mutation [phone].  It has been suggested other variants may not confer the same risk.  Ms. Langone does NOT carry the slavic founder mutation 657del5.   Recent updates to NCCN guidelines. recommend high risk screening ONLY for the 657del5 NBN mutation carriers.  Increased breast screening is not recommended at this time for carriers of other NBN mutations.   Therefore, we did not recommend additional breast screening for Ms. Drumheller.  We did, however, emphasize the importance of monthly self breast exams, clinical breast exams, and annual mammography.   It has also been suggested that mutations in NBN may also be associated with prostate and other cancers, however these risks are not well defined.  Please note that NCCN guidelines and knowledge about NBN mutations changes over time and the guidelines are updated.  They should be directly referenced in the future when managing this patient.   Nijmegan Breakage Syndrome: People who have 2 NBN mutations have a more severe, childhood onset genetic condition called Nijmegen breakage syndrome.  People with [place] breakage syndrome often have short stature, intelectual disability, increased cancer risk, and other health problems. Ms.  Gallus only hs 1 mutation and DOES NOT have this disease. However, if 2 people who both carry 1 NBN mutation have children together, there would be a risk for their children to have this disease.   RECOMMENDATIONS FOR FAMILY MEMBERS:   We recommended Ms. Grochowski share these results with her family members, as they can have genetic testing to determine if they carry the familial NBN mutation.    We know the mutation was paternally inherited because the same mutation was identified in Ms. Iannacone's paternal cousin. Therefore, testing for NBN mutation only needs to be performed for children, siblings,and paternal relatives.   All family members should inform their physicians about the family history of cancer so their doctors can make the most appropriate screening recommendations for them.   FOLLOW-UP: Lastly, we discussed with Ms. Hanahan that cancer genetics is a rapidly advancing field and it is possible that new genetic tests will be appropriate for her and/or her family members in the future. We encouraged her to remain in contact with cancer genetics on an annual basis so we can update her personal and family histories and let her know of advances in cancer genetics that may benefit this family.   Our  contact number was provided. Ms. Barrick questions were answered to her satisfaction, and she knows she is welcome to call us at anytime with additional questions or concerns.   Ferol Luz, MS, [facility] Certified Genetic Counselor [email or web address]

## 2018-01-02 NOTE — Telephone Encounter (Signed)
Made in error - duplicate

## 2018-03-05 ENCOUNTER — Other Ambulatory Visit: Payer: Self-pay | Admitting: Family Medicine

## 2018-03-08 ENCOUNTER — Encounter: Payer: Self-pay | Admitting: Podiatry

## 2018-03-08 ENCOUNTER — Ambulatory Visit: Payer: Medicare Other | Admitting: Podiatry

## 2018-03-08 DIAGNOSIS — M79676 Pain in unspecified toe(s): Secondary | ICD-10-CM

## 2018-03-08 DIAGNOSIS — B351 Tinea unguium: Secondary | ICD-10-CM | POA: Diagnosis not present

## 2018-03-08 MED ORDER — MELOXICAM 15 MG PO TABS: 15.0000 mg | ORAL_TABLET | Freq: Every day | ORAL | 3 refills | Status: DC

## 2018-03-11 NOTE — Progress Notes (Signed)
   SUBJECTIVE Patient presents to office today complaining of elongated, thickened nails that cause pain while ambulating in shoes. She is unable to trim her own nails. Patient is here for further evaluation and treatment.  Past Medical History:  Diagnosis Date  . Allergy    allergic rhinitis  . Anxiety   . Arthritis    OA knee replacement  . Family history of breast cancer   . Family history of genetic disease carrier    paternal cousin has NBN mutation  . GERD (gastroesophageal reflux disease)   . Hyperlipidemia   . Hypertension   . Hypothyroid   . Reactive airways dysfunction syndrome ()   . Skin lesions, generalized    squamous cell skin lesions and basal cell skin lesions    OBJECTIVE General Patient is awake, alert, and oriented x 3 and in no acute distress. Derm Skin is dry and supple bilateral. Negative open lesions or macerations. Remaining integument unremarkable. Nails are tender, long, thickened and dystrophic with subungual debris, consistent with onychomycosis, 1-5 bilateral. No signs of infection noted. Vasc  DP and PT pedal pulses palpable bilaterally. Temperature gradient within normal limits.  Neuro Epicritic and protective threshold sensation grossly intact bilaterally.  Musculoskeletal Exam No symptomatic pedal deformities noted bilateral. Muscular strength within normal limits.  ASSESSMENT 1. Onychodystrophic nails 1-5 bilateral with hyperkeratosis of nails.  2. Onychomycosis of nail due to dermatophyte bilateral 3. Pain in foot bilateral  PLAN OF CARE 1. Patient evaluated today.  2. Instructed to maintain good pedal hygiene and foot care.  3. Mechanical debridement of nails 1-5 bilaterally performed using a nail nipper. Filed with dremel without incident.  4. Return to clinic in 3 mos.    Edrick Kins, DPM [facility]  Dr. Edrick Kins, [address]                                        [place], [place] [address]                 Office [phone]  Fax [phone]

## 2018-03-28 ENCOUNTER — Other Ambulatory Visit: Payer: Self-pay | Admitting: Family Medicine

## 2018-05-03 ENCOUNTER — Other Ambulatory Visit: Payer: Self-pay | Admitting: Family Medicine

## 2018-05-06 NOTE — Telephone Encounter (Signed)
CPE and AWV scheduled on 07/15/18, last filled on 11/21/17 #60 tabs with 0 refills

## 2018-05-10 ENCOUNTER — Ambulatory Visit: Payer: PPO | Admitting: Podiatry

## 2018-05-16 ENCOUNTER — Other Ambulatory Visit: Payer: Self-pay | Admitting: Family Medicine

## 2018-05-16 NOTE — Telephone Encounter (Signed)
It sounds like she is needing /using a lot of this - I refilled for a bigger amount  Please check in with her-I assume she is using it for back pain  How is she doing? Thanks

## 2018-05-16 NOTE — Telephone Encounter (Signed)
Last OV 07/16/2017   Last refilled   60 tablet 0 05/06/2018     Next OV 07/15/2018  Sent to PCP to advise

## 2018-07-05 ENCOUNTER — Other Ambulatory Visit: Payer: Self-pay | Admitting: Family Medicine

## 2018-07-07 ENCOUNTER — Telehealth: Payer: Self-pay | Admitting: Family Medicine

## 2018-07-07 DIAGNOSIS — I10 Essential (primary) hypertension: Secondary | ICD-10-CM

## 2018-07-07 DIAGNOSIS — E7800 Pure hypercholesterolemia, unspecified: Secondary | ICD-10-CM

## 2018-07-07 DIAGNOSIS — R7303 Prediabetes: Secondary | ICD-10-CM

## 2018-07-07 DIAGNOSIS — E039 Hypothyroidism, unspecified: Secondary | ICD-10-CM

## 2018-07-07 NOTE — Telephone Encounter (Signed)
-----   Message from Ellamae Sia sent at 07/02/2018  3:05 PM EDT ----- Regarding: Lab orders for Monday, 6.22.20 Patient is scheduled for CPX labs, please order future labs, Thanks , Karna Christmas

## 2018-07-08 ENCOUNTER — Other Ambulatory Visit (INDEPENDENT_AMBULATORY_CARE_PROVIDER_SITE_OTHER): Payer: PPO

## 2018-07-08 DIAGNOSIS — I10 Essential (primary) hypertension: Secondary | ICD-10-CM

## 2018-07-08 DIAGNOSIS — E039 Hypothyroidism, unspecified: Secondary | ICD-10-CM

## 2018-07-08 DIAGNOSIS — R7303 Prediabetes: Secondary | ICD-10-CM | POA: Diagnosis not present

## 2018-07-08 LAB — CBC WITH DIFFERENTIAL/PLATELET
Basophils Absolute: 0.1 10*3/uL (ref 0.0–0.1)
Basophils Relative: 1.1 % (ref 0.0–3.0)
Eosinophils Absolute: 0.4 10*3/uL (ref 0.0–0.7)
Eosinophils Relative: 5.3 % — ABNORMAL HIGH (ref 0.0–5.0)
HCT: 39.4 % (ref 36.0–46.0)
Hemoglobin: 12.9 g/dL (ref 12.0–15.0)
Lymphocytes Relative: 29.7 % (ref 12.0–46.0)
Lymphs Abs: 2 10*3/uL (ref 0.7–4.0)
MCHC: 32.7 g/dL (ref 30.0–36.0)
MCV: 79.9 fl (ref 78.0–100.0)
Monocytes Absolute: 0.7 10*3/uL (ref 0.1–1.0)
Monocytes Relative: 10.9 % (ref 3.0–12.0)
Neutro Abs: 3.5 10*3/uL (ref 1.4–7.7)
Neutrophils Relative %: 53 % (ref 43.0–77.0)
Platelets: 290 10*3/uL (ref 150.0–400.0)
RBC: 4.93 Mil/uL (ref 3.87–5.11)
RDW: 16.7 % — ABNORMAL HIGH (ref 11.5–15.5)
WBC: 6.7 10*3/uL (ref 4.0–10.5)

## 2018-07-08 LAB — COMPREHENSIVE METABOLIC PANEL
ALT: 14 U/L (ref 0–35)
AST: 15 U/L (ref 0–37)
Albumin: 3.9 g/dL (ref 3.5–5.2)
Alkaline Phosphatase: 79 U/L (ref 39–117)
BUN: 27 mg/dL — ABNORMAL HIGH (ref 6–23)
CO2: 29 mEq/L (ref 19–32)
Calcium: 9.4 mg/dL (ref 8.4–10.5)
Chloride: 101 mEq/L (ref 96–112)
Creatinine, Ser: 1.24 mg/dL — ABNORMAL HIGH (ref 0.40–1.20)
GFR: 42.34 mL/min — ABNORMAL LOW (ref >60.00)
Glucose, Bld: 92 mg/dL (ref 70–99)
Potassium: 4.4 mEq/L (ref 3.5–5.1)
Sodium: 135 mEq/L (ref 135–145)
Total Bilirubin: 0.5 mg/dL (ref 0.2–1.2)
Total Protein: 6.6 g/dL (ref 6.0–8.3)

## 2018-07-08 LAB — TSH: TSH: 3.58 u[IU]/mL (ref 0.35–4.50)

## 2018-07-08 LAB — HEMOGLOBIN A1C: Hgb A1c MFr Bld: 6.2 % (ref 4.6–6.5)

## 2018-07-08 LAB — LIPID PANEL
Cholesterol: 167 mg/dL (ref 0–200)
HDL: 43 mg/dL (ref >39.00)
LDL Cholesterol: 98 mg/dL (ref 0–99)
NonHDL: 123.73
Total CHOL/HDL Ratio: 4
Triglycerides: 130 mg/dL (ref 0.0–149.0)
VLDL: 26 mg/dL (ref 0.0–40.0)

## 2018-07-09 ENCOUNTER — Ambulatory Visit: Payer: PPO | Admitting: Podiatry

## 2018-07-15 ENCOUNTER — Encounter: Payer: Self-pay | Admitting: Family Medicine

## 2018-07-15 ENCOUNTER — Ambulatory Visit (INDEPENDENT_AMBULATORY_CARE_PROVIDER_SITE_OTHER): Payer: PPO | Admitting: Family Medicine

## 2018-07-15 ENCOUNTER — Ambulatory Visit (INDEPENDENT_AMBULATORY_CARE_PROVIDER_SITE_OTHER): Payer: PPO

## 2018-07-15 ENCOUNTER — Other Ambulatory Visit: Payer: Self-pay

## 2018-07-15 VITALS — BP 130/70 | HR 60 | Wt 236.0 lb

## 2018-07-15 DIAGNOSIS — Z0000 Encounter for general adult medical examination without abnormal findings: Secondary | ICD-10-CM

## 2018-07-15 DIAGNOSIS — E039 Hypothyroidism, unspecified: Secondary | ICD-10-CM | POA: Diagnosis not present

## 2018-07-15 DIAGNOSIS — D329 Benign neoplasm of meninges, unspecified: Secondary | ICD-10-CM | POA: Diagnosis not present

## 2018-07-15 DIAGNOSIS — I10 Essential (primary) hypertension: Secondary | ICD-10-CM

## 2018-07-15 DIAGNOSIS — N952 Postmenopausal atrophic vaginitis: Secondary | ICD-10-CM | POA: Diagnosis not present

## 2018-07-15 DIAGNOSIS — E7800 Pure hypercholesterolemia, unspecified: Secondary | ICD-10-CM | POA: Diagnosis not present

## 2018-07-15 DIAGNOSIS — N951 Menopausal and female climacteric states: Secondary | ICD-10-CM | POA: Diagnosis not present

## 2018-07-15 DIAGNOSIS — R7303 Prediabetes: Secondary | ICD-10-CM

## 2018-07-15 DIAGNOSIS — Z6836 Body mass index (BMI) 36.0-36.9, adult: Secondary | ICD-10-CM

## 2018-07-15 DIAGNOSIS — R7989 Other specified abnormal findings of blood chemistry: Secondary | ICD-10-CM | POA: Diagnosis not present

## 2018-07-15 MED ORDER — VERAPAMIL HCL ER 240 MG PO TBCR: EXTENDED_RELEASE_TABLET | ORAL | 3 refills | Status: DC

## 2018-07-15 MED ORDER — TRIAMTERENE-HCTZ 37.5-25 MG PO TABS: 2.0000 | ORAL_TABLET | Freq: Every day | ORAL | 3 refills | Status: DC

## 2018-07-15 MED ORDER — ESTRADIOL 0.0375 MG/24HR TD PTTW: MEDICATED_PATCH | TRANSDERMAL | 1 refills | Status: DC

## 2018-07-15 MED ORDER — ESOMEPRAZOLE MAGNESIUM 40 MG PO CPDR: DELAYED_RELEASE_CAPSULE | ORAL | 3 refills | Status: DC

## 2018-07-15 MED ORDER — LISINOPRIL 5 MG PO TABS: 5.0000 mg | ORAL_TABLET | Freq: Every day | ORAL | 3 refills | Status: DC

## 2018-07-15 NOTE — Patient Instructions (Signed)
Amber Miles , Thank you for taking time to come for your Medicare Wellness Visit. I appreciate your ongoing commitment to your health goals. Please review the following plan we discussed and let me know if I can assist you in the future.   These are the goals we discussed: Goals    . Patient Stated     Starting 07/15/2018, I will continue to take medications as prescribed.        This is a list of the screening recommended for you and due dates:  Health Maintenance  Topic Date Due  . Flu Shot  08/17/2018  . Mammogram  11/13/2018  . Colon Cancer Screening  08/02/2020  . Tetanus Vaccine  12/03/2021  . DEXA scan (bone density measurement)  Completed  .  Hepatitis C: One time screening is recommended by Center for Disease Control  (CDC) for  adults born from [birth year] through 1966.   Completed  . Pneumonia vaccines  Completed   Preventive Care for Adults  A healthy lifestyle and preventive care can promote health and wellness. Preventive health guidelines for adults include the following key practices.  . A routine yearly physical is a good way to check with your health care provider about your health and preventive screening. It is a chance to share any concerns and updates on your health and to receive a thorough exam.  . Visit your dentist for a routine exam and preventive care every 6 months. Brush your teeth twice a day and floss once a day. Good oral hygiene prevents tooth decay and gum disease.  . The frequency of eye exams is based on your age, health, family medical history, use  of contact lenses, and other factors. Follow your health care provider's recommendations for frequency of eye exams.  . Eat a healthy diet. Foods like vegetables, fruits, whole grains, low-fat dairy products, and lean protein foods contain the nutrients you need without too many calories. Decrease your intake of foods high in solid fats, added sugars, and salt. Eat the right amount of calories for you. Get  information about a proper diet from your health care provider, if necessary.  . Regular physical exercise is one of the most important things you can do for your health. Most adults should get at least 150 minutes of moderate-intensity exercise (any activity that increases your heart rate and causes you to sweat) each week. In addition, most adults need muscle-strengthening exercises on 2 or more days a week.  Silver Sneakers may be a benefit available to you. To determine eligibility, you may visit the website: www.silversneakers.com or contact program at 386-207-3697 Mon-Fri between 8AM-8PM.   . Maintain a healthy weight. The body mass index (BMI) is a screening tool to identify possible weight problems. It provides an estimate of body fat based on height and weight. Your health care provider can find your BMI and can help you achieve or maintain a healthy weight.   For adults 20 years and older: ? A BMI below 18.5 is considered underweight. ? A BMI of 18.5 to 24.9 is normal. ? A BMI of 25 to 29.9 is considered overweight. ? A BMI of 30 and above is considered obese.   . Maintain normal blood lipids and cholesterol levels by exercising and minimizing your intake of saturated fat. Eat a balanced diet with plenty of fruit and vegetables. Blood tests for lipids and cholesterol should begin at age 7 and be repeated every 5 years. If your lipid or cholesterol  levels are high, you are over 50, or you are at high risk for heart disease, you may need your cholesterol levels checked more frequently. Ongoing high lipid and cholesterol levels should be treated with medicines if diet and exercise are not working.  . If you smoke, find out from your health care provider how to quit. If you do not use tobacco, please do not start.  . If you choose to drink alcohol, please do not consume more than 2 drinks per day. One drink is considered to be 12 ounces (355 mL) of beer, 5 ounces (148 mL) of wine, or 1.5  ounces (44 mL) of liquor.  . If you are 62-50 years old, ask your health care provider if you should take aspirin to prevent strokes.  . Use sunscreen. Apply sunscreen liberally and repeatedly throughout the day. You should seek shade when your shadow is shorter than you. Protect yourself by wearing long sleeves, pants, a wide-brimmed hat, and sunglasses year round, whenever you are outdoors.  . Once a month, do a whole body skin exam, using a mirror to look at the skin on your back. Tell your health care provider of new moles, moles that have irregular borders, moles that are larger than a pencil eraser, or moles that have changed in shape or color.

## 2018-07-15 NOTE — Assessment & Plan Note (Signed)
bp in fair control at this time  BP Readings from Last 1 Encounters:  07/15/18 130/70   No changes needed Most recent labs reviewed  Disc lifstyle change with low sodium diet and exercise

## 2018-07-15 NOTE — Assessment & Plan Note (Signed)
No symptoms 

## 2018-07-15 NOTE — Assessment & Plan Note (Signed)
Pt continues estrogen patch Offered to change to vaginal cream to decrease breast cancer risk-she declined at this time

## 2018-07-15 NOTE — Assessment & Plan Note (Signed)
Hypothyroidism  Pt has no clinical changes No change in energy level/ hair or skin/ edema and no tremor Lab Results  Component Value Date   TSH 3.58 07/08/2018

## 2018-07-15 NOTE — Patient Instructions (Addendum)
Try to increase water intake for kidney health to offset the meloxicam  Aim for 64 oz of fluids per day  If numbers worsen in the future we will have to stop the meloxicam (or any nsaids)   We have the option of changing your hormone supplement to a cream -this may be safer in terms of breast cancer risk and would also help your vaginal dryness   Stay active in your home  Work on healthy diet and weight loss

## 2018-07-15 NOTE — Assessment & Plan Note (Signed)
Discussed how this problem influences overall health and the risks it imposes  Reviewed plan for weight loss with lower calorie diet (via better food choices and also portion control or program like weight watchers) and exercise building up to or more than 30 minutes 5 days per week including some aerobic activity    

## 2018-07-15 NOTE — Progress Notes (Signed)
Virtual Visit via Telephone Note  I connected with Amber Miles on 07/15/18 at 11:30 AM EDT by telephone and verified that I am speaking with the correct person using two identifiers.  Location: Patient: home Provider: office    I discussed the limitations, risks, security and privacy concerns of performing an evaluation and management service by telephone and the availability of in person appointments. I also discussed with the patient that there may be a patient responsible charge related to this service. The patient expressed understanding and agreed to proceed.   History of Present Illness: Here for annual f/u of chronic medical problems  Staying in during the pandemic- she keeps very busy  Stays connected with people by phone  Went to grand child's drive thru graduation  Does not get outdoors much due to her allergies    Weight at home 236 lb Wt Readings from Last 3 Encounters:  07/16/17 230 lb (104.3 kg)  07/02/17 229 lb 8 oz (104.1 kg)  01/04/17 234 lb (106.1 kg)  does some exercise at home - esp for her back  She is eating healthy- lots of vegetables Derek Jack has a garden which is helpful  amw today -no gaps or concerns identified   Mammogram 10/19  Self breast exam -no lumps   Colonoscopy 7/17 with 5 y recall  dexa 3/15 -normal bmd  Supplements  She takes mvi  No falls and no fx   fam hx of NBN mutation  Was told to continue her usual screening  Was not told to stop her estrogen patch -she does not want to  She had genetic screen for this   Hypertension BP Readings from Last 3 Encounters:  07/16/17 126/72  07/02/17 130/76  01/04/17 132/66   Pulse Readings from Last 3 Encounters:  07/16/17 60  07/02/17 64  01/04/17 69     Taking triam-hct and calan sr and lisinopril as well as K  Taking meloxicam from foot doc for hammer toe   Lab Results  Component Value Date   CREATININE 1.24 (H) 07/08/2018   BUN 27 (H) 07/08/2018   NA 135 07/08/2018   K  4.4 07/08/2018   CL 101 07/08/2018   CO2 29 07/08/2018    Lab Results  Component Value Date   ALT 14 07/08/2018   AST 15 07/08/2018   ALKPHOS 79 07/08/2018   BILITOT 0.5 07/08/2018    Lab Results  Component Value Date   WBC 6.7 07/08/2018   HGB 12.9 07/08/2018   HCT 39.4 07/08/2018   MCV 79.9 07/08/2018   PLT 290.0 07/08/2018    Hypothyroidism  Pt has no clinical changes No change in energy level/ hair or skin/ edema and no tremor Lab Results  Component Value Date   TSH 3.58 07/08/2018    Levothyroxine 112 mcg   Hyperlipidemia Lab Results  Component Value Date   CHOL 167 07/08/2018   CHOL 177 07/02/2017   CHOL 180 06/23/2016   Lab Results  Component Value Date   HDL 43.00 07/08/2018   HDL 44.00 07/02/2017   HDL 42.40 06/23/2016   Lab Results  Component Value Date   LDLCALC 98 07/08/2018   LDLCALC 101 (H) 07/02/2017   LDLCALC 106 (H) 06/23/2016   Lab Results  Component Value Date   TRIG 130.0 07/08/2018   TRIG 157.0 (H) 07/02/2017   TRIG 157.0 (H) 06/23/2016   Lab Results  Component Value Date   CHOLHDL 4 07/08/2018   CHOLHDL 4 07/02/2017  CHOLHDL 4 06/23/2016   Lab Results  Component Value Date   LDLDIRECT 105.0 06/15/2015   Mild Controlled with diet - eating more vegetables   Prediabetes Lab Results  Component Value Date   HGBA1C 6.2 07/08/2018  this is up from 6.1 last time  She did eat donuts recently (a rare treat)   Menopausal vasomotor symptoms Still uses est patch for this and vaginal atrophy   Review of Systems  Constitutional: Negative for chills, fever, malaise/fatigue and weight loss.  HENT: Negative for sore throat.   Eyes: Negative for blurred vision.  Respiratory: Negative for cough and shortness of breath.   Cardiovascular: Negative for chest pain, palpitations and leg swelling.  Gastrointestinal: Negative for abdominal pain, heartburn and nausea.  Musculoskeletal: Positive for back pain. Negative for myalgias.  Skin:  Negative for itching and rash.  Neurological: Negative for dizziness, focal weakness and headaches.  Endo/Heme/Allergies: Negative for polydipsia.  Psychiatric/Behavioral: Negative for depression. The patient is not nervous/anxious.     Observations/Objective: Pt sounds like her normal self  Not sob or hoarse  Nl affect- cheerful and talkative Nl cognition/mentally sharp Able to hear the phone conversation well   Assessment and Plan: Problem List Items Addressed This Visit      Cardiovascular and Mediastinum   HYPERTENSION, BENIGN - Primary    bp in fair control at this time  BP Readings from Last 1 Encounters:  07/15/18 130/70   No changes needed Most recent labs reviewed  Disc lifstyle change with low sodium diet and exercise        Relevant Medications   lisinopril (ZESTRIL) 5 MG tablet   triamterene-hydrochlorothiazide (MAXZIDE-25) 37.5-25 MG tablet   verapamil (CALAN-SR) 240 MG CR tablet     Endocrine   Hypothyroidism    Hypothyroidism  Pt has no clinical changes No change in energy level/ hair or skin/ edema and no tremor Lab Results  Component Value Date   TSH 3.58 07/08/2018            Nervous and Auditory   Meningioma (HCC)    No symptoms         Genitourinary   Atrophic vaginitis    Pt continues estrogen patch Offered to change to vaginal cream to decrease breast cancer risk-she declined at this time        Other   Hyperlipidemia    Disc goals for lipids and reasons to control them Rev last labs with pt Rev low sat fat diet in detail Come improvement with good diet       Relevant Medications   lisinopril (ZESTRIL) 5 MG tablet   triamterene-hydrochlorothiazide (MAXZIDE-25) 37.5-25 MG tablet   verapamil (CALAN-SR) 240 MG CR tablet   Obesity    Discussed how this problem influences overall health and the risks it imposes  Reviewed plan for weight loss with lower calorie diet (via better food choices and also portion control or program like  weight watchers) and exercise building up to or more than 30 minutes 5 days per week including some aerobic activity         Prediabetes    Lab Results  Component Value Date   HGBA1C 6.2 07/08/2018   disc imp of low glycemic diet and wt loss to prevent DM2       Vasomotor symptoms due to menopause    Continues estradiol patch for this and vaginal dryness Due to severe symptoms- not interested in stopping despite breast cancer risk  Elevated serum creatinine    Mild elevation presumably from recent start of meloxicam from foot doctor and need for more fluid intake She is not willing to stop nsaid but hopes not to need it for long Disc plan to inc fluids to 64 oz per day          Follow Up Instructions: Try to increase water intake for kidney health to offset the meloxicam  Aim for 64 oz of fluids per day  If numbers worsen in the future we will have to stop the meloxicam (or any nsaids)   We have the option of changing your hormone supplement to a cream -this may be safer in terms of breast cancer risk and would also help your vaginal dryness   Stay active in your home  Work on healthy diet and weight loss    I discussed the assessment and treatment plan with the patient. The patient was provided an opportunity to ask questions and all were answered. The patient agreed with the plan and demonstrated an understanding of the instructions.   The patient was advised to call back or seek an in-person evaluation if the symptoms worsen or if the condition fails to improve as anticipated.  I provided 24 minutes of non-face-to-face time during this encounter.   Loura Pardon, MD

## 2018-07-15 NOTE — Assessment & Plan Note (Signed)
Mild elevation presumably from recent start of meloxicam from foot doctor and need for more fluid intake She is not willing to stop nsaid but hopes not to need it for long Disc plan to inc fluids to 64 oz per day

## 2018-07-15 NOTE — Progress Notes (Signed)
Subjective:   Amber Miles is a 73 y.o. female who presents for Medicare Annual (Subsequent) preventive examination.  Review of Systems:  N/A Cardiac Risk Factors include: advanced age (>75men, >78 women);dyslipidemia;hypertension     Objective:     Vitals: There were no vitals taken for this visit.  There is no height or weight on file to calculate BMI.  Advanced Directives 07/15/2018 07/02/2017 06/23/2016 06/22/2015  Does Patient Have a Medical Advance Directive? Yes Yes Yes Yes  Type of Advance Directive Living will;Healthcare Power of [place];Living will [place];Living will [place];Living will  Does patient want to make changes to medical advance directive? No - Patient declined - - No - Patient declined  Copy of [place] in Chart? No - copy requested No - copy requested No - copy requested No - copy requested    Tobacco Social History   Tobacco Use  Smoking Status Never Smoker  Smokeless Tobacco Never Used     Counseling given: No   Clinical Intake:  Pre-visit preparation completed: Yes  Pain : No/denies pain Pain Score: 0-No pain     Nutritional Status: BMI > 30  Obese Nutritional Risks: None  How often do you need to have someone help you when you read instructions, pamphlets, or other written materials from your doctor or pharmacy?: 1 - Never What is the last grade level you completed in school?: 12th grade + some college  Interpreter Needed?: No  Comments: pt lives independently Information entered by :: LPinson, RN  Past Medical History:  Diagnosis Date  . Allergy    allergic rhinitis  . Anxiety   . Arthritis    OA knee replacement  . Family history of breast cancer   . Family history of genetic disease carrier    paternal cousin has NBN mutation  . GERD (gastroesophageal reflux disease)   . Hyperlipidemia   . Hypertension   . Hypothyroid   . Reactive  airways dysfunction syndrome ([place])   . Skin lesions, generalized    squamous cell skin lesions and basal cell skin lesions   Past Surgical History:  Procedure Laterality Date  . ABDOMINAL HYSTERECTOMY  1990s   hyst with oophrectomy for B9lesion/cyst and prolapse  . BREAST BIOPSY Bilateral    benign  . JOINT REPLACEMENT  05/2009   left total knee replacement  . recocele     and cystocele repair  . tsa     Family History  Problem Relation Age of Onset  . Hypertension Mother   . Stroke Mother   . Depression Mother   . Alzheimer's disease Mother   . Heart disease Father   . Cerebral palsy Father   . Hypertension Sister   . Breast cancer Cousin 50       NBN mutation  . Melanoma Maternal Uncle        dx >50   Social History   Socioeconomic History  . Marital status: Widowed    Spouse name: Not on file  . Number of children: Not on file  . Years of education: Not on file  . Highest education level: Not on file  Occupational History  . Not on file  Social Needs  . Financial resource strain: Not on file  . Food insecurity    Worry: Not on file    Inability: Not on file  . Transportation needs    Medical: Not on file  Non-medical: Not on file  Tobacco Use  . Smoking status: Never Smoker  . Smokeless tobacco: Never Used  Substance and Sexual Activity  . Alcohol use: No    Alcohol/week: 0.0 standard drinks  . Drug use: No  . Sexual activity: Not Currently  Lifestyle  . Physical activity    Days per week: Not on file    Minutes per session: Not on file  . Stress: Not on file  Relationships  . Social Herbalist on phone: Not on file    Gets together: Not on file    Attends religious service: Not on file    Active member of club or organization: Not on file    Attends meetings of clubs or organizations: Not on file    Relationship status: Not on file  Other Topics Concern  . Not on file  Social History Narrative  . Not on file    Outpatient  Encounter Medications as of 07/15/2018  Medication Sig  . acyclovir (ZOVIRAX) 800 MG tablet TAKE 1 TABLET BY MOUTH 3 TIMES DAILY FOR 3 DAYS FLARES  . amoxicillin (AMOXIL) 500 MG capsule Take 500 mg by mouth as directed.  . docusate sodium (COLACE) 100 MG capsule Take 100 mg by mouth 2 (two) times daily.  Marland Kitchen esomeprazole (NEXIUM) 40 MG capsule TAKE ONE CAPSULE BY MOUTH EVERY MORNING BEFORE BREAKFAST  . estradiol (VIVELLE-DOT) 0.0375 MG/24HR PLACE 1 PATCH ONTO THE SKIN 2 (TWO) TIMES A WEEK.  . Glucosamine-Chondroitin 750-600 MG TABS Take 1 tablet by mouth 2 (two) times daily.    Marland Kitchen lisinopril (PRINIVIL,ZESTRIL) 5 MG tablet Take 1 tablet (5 mg total) by mouth daily.  . meloxicam (MOBIC) 15 MG tablet Take 1 tablet (15 mg total) by mouth daily.  . montelukast (SINGULAIR) 10 MG tablet Take 10 mg by mouth daily.   . Multiple Vitamin (MULTIVITAMIN) tablet Take 1 tablet by mouth daily.    . mupirocin ointment (BACTROBAN) 2 % Apply 1 application topically daily as needed.  . Olopatadine HCl (PATANASE NA) Place 2 sprays into the nose 2 (two) times daily.  . potassium chloride SA (KLOR-CON M20) 20 MEQ tablet TAKE 1 TABLET (20 MEQ TOTAL) BY MOUTH ONCE DAILY  . PROAIR HFA 108 (90 BASE) MCG/ACT inhaler USE 2 PUFFS EVERY 4 HOURS AS NEEDED FOR WHEEZING  . QVAR REDIHALER 80 MCG/ACT inhaler   . SYNTHROID 112 MCG tablet Take 112 mcg by mouth daily.  Marland Kitchen tiZANidine (ZANAFLEX) 4 MG tablet TAKE 1 TABLET BY MOUTH EVERY 6 HOURS AS NEEDED  . traMADol (ULTRAM) 50 MG tablet TAKE 2 TABLETS BY MOUTH EVERY 8 HOURS AS NEEDED  . triamterene-hydrochlorothiazide (MAXZIDE-25) 37.5-25 MG tablet TAKE 2 TABLETS BY MOUTH EVERY DAY  . verapamil (CALAN-SR) 240 MG CR tablet TAKE 1 TABLET (240 MG TOTAL) BY MOUTH 2 (TWO) TIMES DAILY.   No facility-administered encounter medications on file as of 07/15/2018.     Activities of Daily Living In your present state of health, do you have any difficulty performing the following activities:  07/15/2018  Hearing? N  Vision? N  Difficulty concentrating or making decisions? N  Walking or climbing stairs? N  Dressing or bathing? N  Doing errands, shopping? N  Preparing Food and eating ? N  Using the Toilet? N  In the past six months, have you accidently leaked urine? N  Do you have problems with loss of bowel control? N  Managing your Medications? N  Managing your Finances? N  Housekeeping or managing your Housekeeping? N  Some recent data might be hidden    Patient Care Team: Tower, Wynelle Fanny, MD as PCP - Silvano Rusk, MD as Consulting Physician (Ophthalmology)    Assessment:   This is a routine wellness examination for [place].   Hearing Screening   125Hz  250Hz  500Hz  1000Hz  2000Hz  3000Hz  4000Hz  6000Hz  8000Hz   Right ear:           Left ear:           Vision Screening Comments: Vision exam in 2019 @ [facility]   Exercise Activities and Dietary recommendations Current Exercise Habits: The patient does not participate in regular exercise at present, Exercise limited by: None identified  Goals    . Patient Stated     Starting 07/15/2018, I will continue to take medications as prescribed.        Fall Risk Fall Risk  07/15/2018 07/02/2017 06/23/2016 06/22/2015 05/27/2014  Falls in the past year? 0 Yes No No No  Comment - lost balance due to foot being numb; multiple bruises - - -  Number falls in past yr: - 1 - - -  Injury with Fall? - Yes - - -   Depression Screen PHQ 2/9 Scores 07/15/2018 07/02/2017 06/23/2016 06/22/2015  PHQ - 2 Score 0 0 0 0  PHQ- 9 Score 0 0 - -     Cognitive Function MMSE - Mini Mental State Exam 07/15/2018 07/02/2017 06/23/2016 06/22/2015  Orientation to time 5 5 5 5   Orientation to Place 5 5 5 5   Registration 3 3 3 3   Attention/ Calculation 0 0 0 0  Recall 3 3 3 3   Language- name 2 objects 0 0 0 0  Language- repeat 1 1 1 1   Language- follow 3 step command 0 3 3 3   Language- read & follow direction 0 0 0 0  Write a  sentence 0 0 0 0  Copy design 0 0 0 0  Total score 17 20 20 20      PLEASE NOTE: A Mini-Cog screen was completed. Maximum score is 17. A value of 0 denotes this part of [place] MMSE was not completed or the patient failed this part of the Mini-Cog screening.   Mini-Cog Screening Orientation to Time - Max 5 pts Orientation to Place - Max 5 pts Registration - Max 3 pts Recall - Max 3 pts Language Repeat - Max 1 pts      Immunization History  Administered Date(s) Administered  . Influenza Split 10/18/2010  . Influenza Whole 11/30/2006, 10/28/2008, 10/27/2009  . Influenza, High Dose Seasonal PF 11/11/2015, 10/05/2016, 10/18/2017  . Influenza-Unspecified 10/07/2012, 08/30/2013, 10/01/2014  . Pneumococcal Conjugate-13 05/27/2014  . Pneumococcal Polysaccharide-23 12/04/2011  . Td 12/04/2011  . Zoster 10/05/2010    Screening Tests Health Maintenance  Topic Date Due  . INFLUENZA VACCINE  08/17/2018  . MAMMOGRAM  11/13/2018  . COLONOSCOPY  08/02/2020  . TETANUS/TDAP  12/03/2021  . DEXA SCAN  Completed  . Hepatitis C Screening  Completed  . PNA vac Low Risk Adult  Completed      Plan:     I have personally reviewed, addressed, and noted the following in the patient's chart:  A. Medical and social history B. Use of alcohol, tobacco or illicit drugs  C. Current medications and supplements D. Functional ability and status E.  Nutritional status F.  Physical activity G. Advance directives H. List of other physicians I.  Hospitalizations, surgeries, and ER visits  in previous 12 months J.  Vitals (unless it is a telemedicine encounter) K. Screenings to include cognitive, depression, hearing, vision (NOTE: hearing and vision screenings not completed in telemedicine encounter) L. Referrals and appointments   In addition, I have reviewed and discussed with patient certain preventive protocols, quality metrics, and best practice recommendations. A written personalized care plan for  preventive services and recommendations were provided to patient.  With patient's permission, we connected on 07/15/18 at 11:00 AM EDT. Interactive audio and video telecommunications were attempted with patient. This attempt was unsuccessful due to patient having technical difficulties OR patient did not have access to video capability.  Encounter was completed with audio only.  Two patient identifiers were used to ensure the encounter occurred with the correct person. Patient was in home and writer was in office.     Signed,   Lindell Noe, MHA, BS, RN Health Coach

## 2018-07-15 NOTE — Assessment & Plan Note (Signed)
Lab Results  Component Value Date   HGBA1C 6.2 07/08/2018   disc imp of low glycemic diet and wt loss to prevent DM2

## 2018-07-15 NOTE — Assessment & Plan Note (Signed)
Continues estradiol patch for this and vaginal dryness Due to severe symptoms- not interested in stopping despite breast cancer risk

## 2018-07-15 NOTE — Progress Notes (Signed)
PCP notes:   Health maintenance:  No gaps identified  Abnormal screenings:   None  Patient concerns:   None  Nurse concerns:  None  Next PCP appt:   07/15/18 @ 1130  I reviewed health advisor's note, was available for consultation, and agree with documentation and plan. Loura Pardon MD

## 2018-07-15 NOTE — Assessment & Plan Note (Signed)
Disc goals for lipids and reasons to control them Rev last labs with pt Rev low sat fat diet in detail Come improvement with good diet

## 2018-07-25 ENCOUNTER — Other Ambulatory Visit: Payer: Self-pay | Admitting: Family Medicine

## 2018-07-26 NOTE — Telephone Encounter (Signed)
Phone CPE was done on 07/15/18, last filled on 05/16/18 #120 tabs with 0 refills, please advise

## 2018-08-12 ENCOUNTER — Other Ambulatory Visit: Payer: Self-pay

## 2018-08-12 MED ORDER — MELOXICAM 15 MG PO TABS: 15.0000 mg | ORAL_TABLET | Freq: Every day | ORAL | 3 refills | Status: DC

## 2018-08-12 NOTE — Telephone Encounter (Signed)
Patient called left voice mail requesting refill of her Meloxicam.  Per Dr. Amalia Hailey verbal order, ok to refill.   Script has been sent to pharmacy and patient has been notified

## 2018-09-03 DIAGNOSIS — I10 Essential (primary) hypertension: Secondary | ICD-10-CM | POA: Diagnosis not present

## 2018-09-03 DIAGNOSIS — E669 Obesity, unspecified: Secondary | ICD-10-CM | POA: Diagnosis not present

## 2018-09-03 DIAGNOSIS — E042 Nontoxic multinodular goiter: Secondary | ICD-10-CM | POA: Diagnosis not present

## 2018-09-03 DIAGNOSIS — E0521 Thyrotoxicosis with toxic multinodular goiter with thyrotoxic crisis or storm: Secondary | ICD-10-CM | POA: Diagnosis not present

## 2018-09-03 DIAGNOSIS — K229 Disease of esophagus, unspecified: Secondary | ICD-10-CM | POA: Diagnosis not present

## 2018-09-03 DIAGNOSIS — N958 Other specified menopausal and perimenopausal disorders: Secondary | ICD-10-CM | POA: Diagnosis not present

## 2018-09-03 DIAGNOSIS — E890 Postprocedural hypothyroidism: Secondary | ICD-10-CM | POA: Diagnosis not present

## 2018-09-03 DIAGNOSIS — D34 Benign neoplasm of thyroid gland: Secondary | ICD-10-CM | POA: Diagnosis not present

## 2018-09-03 DIAGNOSIS — J4520 Mild intermittent asthma, uncomplicated: Secondary | ICD-10-CM | POA: Diagnosis not present

## 2018-09-11 ENCOUNTER — Other Ambulatory Visit: Payer: Self-pay | Admitting: *Deleted

## 2018-09-11 MED ORDER — POTASSIUM CHLORIDE CRYS ER 20 MEQ PO TBCR: EXTENDED_RELEASE_TABLET | ORAL | 2 refills | Status: DC

## 2018-09-13 DIAGNOSIS — Z6837 Body mass index (BMI) 37.0-37.9, adult: Secondary | ICD-10-CM | POA: Diagnosis not present

## 2018-09-13 DIAGNOSIS — J4520 Mild intermittent asthma, uncomplicated: Secondary | ICD-10-CM | POA: Diagnosis not present

## 2018-09-13 DIAGNOSIS — D34 Benign neoplasm of thyroid gland: Secondary | ICD-10-CM | POA: Diagnosis not present

## 2018-09-13 DIAGNOSIS — E0521 Thyrotoxicosis with toxic multinodular goiter with thyrotoxic crisis or storm: Secondary | ICD-10-CM | POA: Diagnosis not present

## 2018-09-13 DIAGNOSIS — K229 Disease of esophagus, unspecified: Secondary | ICD-10-CM | POA: Diagnosis not present

## 2018-09-13 DIAGNOSIS — E890 Postprocedural hypothyroidism: Secondary | ICD-10-CM | POA: Diagnosis not present

## 2018-09-13 DIAGNOSIS — E042 Nontoxic multinodular goiter: Secondary | ICD-10-CM | POA: Diagnosis not present

## 2018-09-13 DIAGNOSIS — I10 Essential (primary) hypertension: Secondary | ICD-10-CM | POA: Diagnosis not present

## 2018-09-13 DIAGNOSIS — E669 Obesity, unspecified: Secondary | ICD-10-CM | POA: Diagnosis not present

## 2018-09-13 DIAGNOSIS — N958 Other specified menopausal and perimenopausal disorders: Secondary | ICD-10-CM | POA: Diagnosis not present

## 2018-10-08 ENCOUNTER — Other Ambulatory Visit: Payer: Self-pay | Admitting: Family Medicine

## 2018-10-08 DIAGNOSIS — Z1231 Encounter for screening mammogram for malignant neoplasm of breast: Secondary | ICD-10-CM

## 2018-10-23 ENCOUNTER — Other Ambulatory Visit: Payer: Self-pay | Admitting: Family Medicine

## 2018-10-23 NOTE — Telephone Encounter (Signed)
Already filled

## 2018-11-21 ENCOUNTER — Ambulatory Visit
Admission: RE | Admit: 2018-11-21 | Discharge: 2018-11-21 | Disposition: A | Discharge status: Home / Self Care | Payer: PPO | Source: Ambulatory Visit | Attending: Family Medicine | Admitting: Family Medicine

## 2018-11-21 ENCOUNTER — Other Ambulatory Visit: Payer: Self-pay

## 2018-11-21 DIAGNOSIS — Z1231 Encounter for screening mammogram for malignant neoplasm of breast: Secondary | ICD-10-CM

## 2018-12-26 DIAGNOSIS — L821 Other seborrheic keratosis: Secondary | ICD-10-CM | POA: Diagnosis not present

## 2018-12-26 DIAGNOSIS — Z85828 Personal history of other malignant neoplasm of skin: Secondary | ICD-10-CM | POA: Diagnosis not present

## 2018-12-26 DIAGNOSIS — L820 Inflamed seborrheic keratosis: Secondary | ICD-10-CM | POA: Diagnosis not present

## 2018-12-26 DIAGNOSIS — D2261 Melanocytic nevi of right upper limb, including shoulder: Secondary | ICD-10-CM | POA: Diagnosis not present

## 2018-12-26 DIAGNOSIS — D485 Neoplasm of uncertain behavior of skin: Secondary | ICD-10-CM | POA: Diagnosis not present

## 2018-12-26 DIAGNOSIS — D225 Melanocytic nevi of trunk: Secondary | ICD-10-CM | POA: Diagnosis not present

## 2018-12-26 DIAGNOSIS — L570 Actinic keratosis: Secondary | ICD-10-CM | POA: Diagnosis not present

## 2018-12-26 DIAGNOSIS — C4441 Basal cell carcinoma of skin of scalp and neck: Secondary | ICD-10-CM | POA: Diagnosis not present

## 2019-01-07 ENCOUNTER — Other Ambulatory Visit: Payer: Self-pay | Admitting: Family Medicine

## 2019-01-07 ENCOUNTER — Other Ambulatory Visit: Payer: Self-pay | Admitting: Podiatry

## 2019-01-08 NOTE — Telephone Encounter (Signed)
Pt notified of Dr. Tower's comments and verbalized understanding  

## 2019-01-08 NOTE — Telephone Encounter (Signed)
Name of Medication: Tramadol Name of Pharmacy: [facility] or Written Date and Quantity: 01/02/17 #90 with 0 refills Last Office Visit and Type: phone visit CPE on 07/15/18 Next Office Visit and Type: none scheduled Last Controlled Substance Agreement Date: 12/09/12 Last UDS:12/09/12

## 2019-01-08 NOTE — Telephone Encounter (Signed)
She has not had this since 2018.  Please let me know what she is taking it for so I can update the instructions  Thanks

## 2019-01-08 NOTE — Telephone Encounter (Signed)
Pt said it's for her shoulder pain. Pt said in 2017 she was seen and evaluated and we did xrays on shoulders and it's a chronic pain that occasionally flares up. Pt said she uses it really sparingly but she is out of meds and since her shoulders are bothering her now she requested Dr. Glori Bickers to fill med, please advise

## 2019-01-08 NOTE — Telephone Encounter (Signed)
I sent it  Please let her know that I cannot send as many due to changes in controlled sub laws in [place]  (she does not use often anyway)

## 2019-01-30 ENCOUNTER — Other Ambulatory Visit: Payer: Self-pay | Admitting: Podiatry

## 2019-02-04 ENCOUNTER — Ambulatory Visit: Payer: PPO | Attending: Internal Medicine

## 2019-02-04 DIAGNOSIS — Z23 Encounter for immunization: Secondary | ICD-10-CM

## 2019-02-04 NOTE — Progress Notes (Signed)
   Covid-[facility] Vaccination [facility]  Name:  Amber Miles    MRN: [id number] DOB: 1945-04-06  02/04/2019  [name] was observed post Covid-19 immunization for 15 minutes without incidence. She was provided with Vaccine Information Sheet and instruction to access the V-Safe system.   [name] was instructed to call 911 with any severe reactions post vaccine: Marland Kitchen Difficulty breathing  . Swelling of your face and throat  . A fast heartbeat  . A bad rash all over your body  . Dizziness and weakness    Immunizations Administered    Name Date Dose VIS Date Route   Pfizer COVID-19 Vaccine 02/04/2019 10:35 AM 0.3 mL 12/27/2018 Intramuscular   Manufacturer: [employer]   Lot: [id number]   [place]: [id number]

## 2019-02-24 ENCOUNTER — Ambulatory Visit: Payer: PPO | Attending: Internal Medicine

## 2019-02-24 DIAGNOSIS — Z23 Encounter for immunization: Secondary | ICD-10-CM | POA: Insufficient documentation

## 2019-03-31 ENCOUNTER — Other Ambulatory Visit: Payer: Self-pay | Admitting: Family Medicine

## 2019-04-01 NOTE — Telephone Encounter (Signed)
Name of Medication: Tramadol Name of Pharmacy: [facility] or Written Date and Quantity: 01/08/19 #90 with 0 refills Last Office Visit and Type: phone visit CPE on 07/15/18 Next Office Visit and Type: none scheduled Last Controlled Substance Agreement Date: 12/09/12 Last UDS:12/09/12

## 2019-04-15 DIAGNOSIS — H5203 Hypermetropia, bilateral: Secondary | ICD-10-CM | POA: Diagnosis not present

## 2019-04-15 DIAGNOSIS — H2513 Age-related nuclear cataract, bilateral: Secondary | ICD-10-CM | POA: Diagnosis not present

## 2019-05-15 DIAGNOSIS — J300 Vasomotor rhinitis: Secondary | ICD-10-CM | POA: Diagnosis not present

## 2019-05-15 DIAGNOSIS — H1045 Other chronic allergic conjunctivitis: Secondary | ICD-10-CM | POA: Diagnosis not present

## 2019-05-15 DIAGNOSIS — J4530 Mild persistent asthma, uncomplicated: Secondary | ICD-10-CM | POA: Diagnosis not present

## 2019-06-08 ENCOUNTER — Other Ambulatory Visit: Payer: Self-pay | Admitting: Family Medicine

## 2019-06-09 NOTE — Telephone Encounter (Signed)
No recent or future appts., please advise  

## 2019-06-09 NOTE — Telephone Encounter (Signed)
Please schedule PE after July 1 and refill until then

## 2019-06-10 NOTE — Telephone Encounter (Signed)
Med refilled once and Carrie will reach out to pt to try and get CPE scheduled  

## 2019-06-23 ENCOUNTER — Other Ambulatory Visit: Payer: Self-pay | Admitting: Family Medicine

## 2019-06-23 NOTE — Telephone Encounter (Signed)
Name of [place] Name of Pharmacy:[facility] or Written Date and Quantity:04/01/19 #30 tabs with 0 refills Last Office Visit and Type:phone visit CPE on 07/15/18 Next Office Visit and Type:CPE on 07/29/19 Last Controlled Substance Agreement Date:12/09/12 Last UDS:12/09/12

## 2019-07-07 ENCOUNTER — Other Ambulatory Visit: Payer: Self-pay | Admitting: Family Medicine

## 2019-07-21 ENCOUNTER — Telehealth: Payer: Self-pay | Admitting: Family Medicine

## 2019-07-21 DIAGNOSIS — E7800 Pure hypercholesterolemia, unspecified: Secondary | ICD-10-CM

## 2019-07-21 DIAGNOSIS — E039 Hypothyroidism, unspecified: Secondary | ICD-10-CM

## 2019-07-21 DIAGNOSIS — I10 Essential (primary) hypertension: Secondary | ICD-10-CM

## 2019-07-21 DIAGNOSIS — R7303 Prediabetes: Secondary | ICD-10-CM

## 2019-07-21 NOTE — Telephone Encounter (Signed)
-----   Message from Cloyd Stagers, RT sent at 07/09/2019 11:26 AM EDT ----- Regarding: Lab Orders for Tuesday 7.6.2021 Please place lab orders for Tuesday 7.6.2021, office visit for physical on Tuesday 7.13.2021 Thank you, Dyke Maes RT(R)

## 2019-07-22 ENCOUNTER — Other Ambulatory Visit (INDEPENDENT_AMBULATORY_CARE_PROVIDER_SITE_OTHER): Payer: PPO

## 2019-07-22 ENCOUNTER — Ambulatory Visit (INDEPENDENT_AMBULATORY_CARE_PROVIDER_SITE_OTHER): Payer: PPO

## 2019-07-22 DIAGNOSIS — Z0000 Encounter for general adult medical examination without abnormal findings: Secondary | ICD-10-CM

## 2019-07-22 DIAGNOSIS — E039 Hypothyroidism, unspecified: Secondary | ICD-10-CM

## 2019-07-22 DIAGNOSIS — E7800 Pure hypercholesterolemia, unspecified: Secondary | ICD-10-CM

## 2019-07-22 DIAGNOSIS — R7303 Prediabetes: Secondary | ICD-10-CM

## 2019-07-22 DIAGNOSIS — I10 Essential (primary) hypertension: Secondary | ICD-10-CM | POA: Diagnosis not present

## 2019-07-22 LAB — LIPID PANEL
Cholesterol: 163 mg/dL (ref 0–200)
HDL: 42.9 mg/dL (ref >39.00)
LDL Cholesterol: 83 mg/dL (ref 0–99)
NonHDL: 119.77
Total CHOL/HDL Ratio: 4
Triglycerides: 185 mg/dL — ABNORMAL HIGH (ref 0.0–149.0)
VLDL: 37 mg/dL (ref 0.0–40.0)

## 2019-07-22 LAB — COMPREHENSIVE METABOLIC PANEL
ALT: 17 U/L (ref 0–35)
AST: 18 U/L (ref 0–37)
Albumin: 4 g/dL (ref 3.5–5.2)
Alkaline Phosphatase: 72 U/L (ref 39–117)
BUN: 20 mg/dL (ref 6–23)
CO2: 27 mEq/L (ref 19–32)
Calcium: 9.7 mg/dL (ref 8.4–10.5)
Chloride: 101 mEq/L (ref 96–112)
Creatinine, Ser: 1.07 mg/dL (ref 0.40–1.20)
GFR: 50.05 mL/min — ABNORMAL LOW (ref >60.00)
Glucose, Bld: 97 mg/dL (ref 70–99)
Potassium: 3.7 mEq/L (ref 3.5–5.1)
Sodium: 136 mEq/L (ref 135–145)
Total Bilirubin: 0.4 mg/dL (ref 0.2–1.2)
Total Protein: 6.7 g/dL (ref 6.0–8.3)

## 2019-07-22 LAB — CBC WITH DIFFERENTIAL/PLATELET
Basophils Absolute: 0.1 10*3/uL (ref 0.0–0.1)
Basophils Relative: 1.8 % (ref 0.0–3.0)
Eosinophils Absolute: 0.4 10*3/uL (ref 0.0–0.7)
Eosinophils Relative: 6.1 % — ABNORMAL HIGH (ref 0.0–5.0)
HCT: 37 % (ref 36.0–46.0)
Hemoglobin: 12.1 g/dL (ref 12.0–15.0)
Lymphocytes Relative: 37.8 % (ref 12.0–46.0)
Lymphs Abs: 2.6 10*3/uL (ref 0.7–4.0)
MCHC: 32.8 g/dL (ref 30.0–36.0)
MCV: 79.5 fl (ref 78.0–100.0)
Monocytes Absolute: 0.7 10*3/uL (ref 0.1–1.0)
Monocytes Relative: 9.9 % (ref 3.0–12.0)
Neutro Abs: 3.1 10*3/uL (ref 1.4–7.7)
Neutrophils Relative %: 44.4 % (ref 43.0–77.0)
Platelets: 318 10*3/uL (ref 150.0–400.0)
RBC: 4.65 Mil/uL (ref 3.87–5.11)
RDW: 15.4 % (ref 11.5–15.5)
WBC: 6.9 10*3/uL (ref 4.0–10.5)

## 2019-07-22 LAB — HEMOGLOBIN A1C: Hgb A1c MFr Bld: 6.1 % (ref 4.6–6.5)

## 2019-07-22 LAB — TSH: TSH: 2.5 u[IU]/mL (ref 0.35–4.50)

## 2019-07-22 NOTE — Patient Instructions (Signed)
Amber Miles , Thank you for taking time to come for your Medicare Wellness Visit. I appreciate your ongoing commitment to your health goals. Please review the following plan we discussed and let me know if I can assist you in the future.   Screening recommendations/referrals: Colonoscopy: Up to date, completed 08/03/2015, due 07/2020 Mammogram: Up to date, completed 11/21/2018, due 11/2019 Bone Density: due, will get when mammogram is due Recommended yearly ophthalmology/optometry visit for glaucoma screening and checkup Recommended yearly dental visit for hygiene and checkup  Vaccinations: Influenza vaccine: Up to date, completed 11/11/2018, due 08/2019 Pneumococcal vaccine: Completed series Tdap vaccine: Up to date, completed 12/04/2011, due 11/2021 Shingles vaccine: due, check with insurance for coverage   Covid-19:Completed series  Advanced directives: Please bring a copy of your POA (Power of [place]) and/or Living Will to your next appointment.   Conditions/risks identified: hypertension, hyperlipidemia  Next appointment: Follow up in one year for your annual wellness visit    Preventive Care 44 Years and Older, Female Preventive care refers to lifestyle choices and visits with your health care provider that can promote health and wellness. What does preventive care include?  A yearly physical exam. This is also called an annual well check.  Dental exams once or twice a year.  Routine eye exams. Ask your health care provider how often you should have your eyes checked.  Personal lifestyle choices, including:  Daily care of your teeth and gums.  Regular physical activity.  Eating a healthy diet.  Avoiding tobacco and drug use.  Limiting alcohol use.  Practicing safe sex.  Taking low-dose aspirin every day.  Taking vitamin and mineral supplements as recommended by your health care provider. What happens during an annual well check? The services and screenings done by  your health care provider during your annual well check will depend on your age, overall health, lifestyle risk factors, and family history of disease. Counseling  Your health care provider may ask you questions about your:  Alcohol use.  Tobacco use.  Drug use.  Emotional well-being.  Home and relationship well-being.  Sexual activity.  Eating habits.  History of falls.  Memory and ability to understand (cognition).  Work and work Statistician.  Reproductive health. Screening  You may have the following tests or measurements:  Height, weight, and BMI.  Blood pressure.  Lipid and cholesterol levels. These may be checked every 5 years, or more frequently if you are over 26 years old.  Skin check.  Lung cancer screening. You may have this screening every year starting at age 26 if you have a 30-pack-year history of smoking and currently smoke or have quit within the past 15 years.  Fecal occult blood test (FOBT) of the stool. You may have this test every year starting at age 73.  Flexible sigmoidoscopy or colonoscopy. You may have a sigmoidoscopy every 5 years or a colonoscopy every 10 years starting at age 76.  Hepatitis C blood test.  Hepatitis B blood test.  Sexually transmitted disease (STD) testing.  Diabetes screening. This is done by checking your blood sugar (glucose) after you have not eaten for a while (fasting). You may have this done every 1-3 years.  Bone density scan. This is done to screen for osteoporosis. You may have this done starting at age 22.  Mammogram. This may be done every 1-2 years. Talk to your health care provider about how often you should have regular mammograms. Talk with your health care provider about your test  results, treatment options, and if necessary, the need for more tests. Vaccines  Your health care provider may recommend certain vaccines, such as:  Influenza vaccine. This is recommended every year.  Tetanus,  diphtheria, and acellular pertussis (Tdap, Td) vaccine. You may need a Td booster every 10 years.  Zoster vaccine. You may need this after age 101.  Pneumococcal 13-valent conjugate (PCV13) vaccine. One dose is recommended after age 45.  Pneumococcal polysaccharide (PPSV23) vaccine. One dose is recommended after age 1. Talk to your health care provider about which screenings and vaccines you need and how often you need them. This information is not intended to replace advice given to you by your health care provider. Make sure you discuss any questions you have with your health care provider. Document Released: 01/29/2015 Document Revised: 09/22/2015 Document Reviewed: 11/03/2014 Elsevier Interactive Patient Education  2017 [place] Prevention in the Home Falls can cause injuries. They can happen to people of all ages. There are many things you can do to make your home safe and to help prevent falls. What can I do on the outside of my home?  Regularly fix the edges of walkways and driveways and fix any cracks.  Remove anything that might make you trip as you walk through a door, such as a raised step or threshold.  Trim any bushes or trees on the path to your home.  Use bright outdoor lighting.  Clear any walking paths of anything that might make someone trip, such as rocks or tools.  Regularly check to see if handrails are loose or broken. Make sure that both sides of any steps have handrails.  Any raised decks and porches should have guardrails on the edges.  Have any leaves, snow, or ice cleared regularly.  Use sand or salt on walking paths during winter.  Clean up any spills in your garage right away. This includes oil or grease spills. What can I do in the bathroom?  Use night lights.  Install grab bars by the toilet and in the tub and shower. Do not use towel bars as grab bars.  Use non-skid mats or decals in the tub or shower.  If you need to sit down in  the shower, use a plastic, non-slip stool.  Keep the floor dry. Clean up any water that spills on the floor as soon as it happens.  Remove soap buildup in the tub or shower regularly.  Attach bath mats securely with double-sided non-slip rug tape.  Do not have throw rugs and other things on the floor that can make you trip. What can I do in the bedroom?  Use night lights.  Make sure that you have a light by your bed that is easy to reach.  Do not use any sheets or blankets that are too big for your bed. They should not hang down onto the floor.  Have a firm chair that has side arms. You can use this for support while you get dressed.  Do not have throw rugs and other things on the floor that can make you trip. What can I do in the kitchen?  Clean up any spills right away.  Avoid walking on wet floors.  Keep items that you use a lot in easy-to-reach places.  If you need to reach something above you, use a strong step stool that has a grab bar.  Keep electrical cords out of the way.  Do not use floor polish or wax that makes  floors slippery. If you must use wax, use non-skid floor wax.  Do not have throw rugs and other things on the floor that can make you trip. What can I do with my stairs?  Do not leave any items on the stairs.  Make sure that there are handrails on both sides of the stairs and use them. Fix handrails that are broken or loose. Make sure that handrails are as long as the stairways.  Check any carpeting to make sure that it is firmly attached to the stairs. Fix any carpet that is loose or worn.  Avoid having throw rugs at the top or bottom of the stairs. If you do have throw rugs, attach them to the floor with carpet tape.  Make sure that you have a light switch at the top of the stairs and the bottom of the stairs. If you do not have them, ask someone to add them for you. What else can I do to help prevent falls?  Wear shoes that:  Do not have high  heels.  Have rubber bottoms.  Are comfortable and fit you well.  Are closed at the toe. Do not wear sandals.  If you use a stepladder:  Make sure that it is fully opened. Do not climb a closed stepladder.  Make sure that both sides of the stepladder are locked into place.  Ask someone to hold it for you, if possible.  Clearly mark and make sure that you can see:  Any grab bars or handrails.  First and last steps.  Where the edge of each step is.  Use tools that help you move around (mobility aids) if they are needed. These include:  Canes.  Walkers.  Scooters.  Crutches.  Turn on the lights when you go into a dark area. Replace any light bulbs as soon as they burn out.  Set up your furniture so you have a clear path. Avoid moving your furniture around.  If any of your floors are uneven, fix them.  If there are any pets around you, be aware of where they are.  Review your medicines with your doctor. Some medicines can make you feel dizzy. This can increase your chance of falling. Ask your doctor what other things that you can do to help prevent falls. This information is not intended to replace advice given to you by your health care provider. Make sure you discuss any questions you have with your health care provider. Document Released: 10/29/2008 Document Revised: 06/10/2015 Document Reviewed: 02/06/2014 Elsevier Interactive Patient Education  2017 [employer].

## 2019-07-22 NOTE — Progress Notes (Signed)
PCP notes:  Health Maintenance: Mammogram- due, wants to get this along with her mammogram in November   Abnormal Screenings: none   Patient concerns: none   Nurse concerns: none   Next PCP appt.: 07/29/2019 @ 2:30 pm

## 2019-07-22 NOTE — Progress Notes (Signed)
Subjective:   Amber Miles is a 74 y.o. female who presents for Medicare Annual (Subsequent) preventive examination.  Review of Systems: N/A      I connected with the patient today by telephone and verified that I am speaking with the correct person using two identifiers. Location patient: home Location nurse: work Persons participating in the virtual visit: patient, Marine scientist.   I discussed the limitations, risks, security and privacy concerns of performing an evaluation and management service by telephone and the availability of in person appointments. I also discussed with the patient that there may be a patient responsible charge related to this service. The patient expressed understanding and verbally consented to this telephonic visit.    Interactive audio and video telecommunications were attempted between this nurse and patient, however failed, due to patient having technical difficulties OR patient did not have access to video capability.  We continued and completed visit with audio only.     Cardiac Risk Factors include: advanced age (>32men, >44 women);hypertension;dyslipidemia     Objective:    Today's Vitals   There is no height or weight on file to calculate BMI.  Advanced Directives 07/22/2019 07/15/2018 07/02/2017 06/23/2016 06/22/2015  Does Patient Have a Medical Advance Directive? Yes Yes Yes Yes Yes  Type of Paramedic of [place];Living will Living will;Healthcare Power of [place];Living will [place];Living will [place];Living will  Does patient want to make changes to medical advance directive? - No - Patient declined - - No - Patient declined  Copy of [place] in Chart? No - copy requested No - copy requested No - copy requested No - copy requested No - copy requested    Current Medications (verified) Outpatient Encounter Medications as of 07/22/2019    Medication Sig  . acyclovir (ZOVIRAX) 800 MG tablet TAKE 1 TABLET BY MOUTH 3 TIMES DAILY FOR 3 DAYS FLARES  . amoxicillin (AMOXIL) 500 MG capsule Take 500 mg by mouth as directed.  . docusate sodium (COLACE) 100 MG capsule Take 100 mg by mouth 2 (two) times daily.  Marland Kitchen esomeprazole (NEXIUM) 40 MG capsule TAKE ONE CAPSULE BY MOUTH EVERY MORNING BEFORE BREAKFAST  . estradiol (VIVELLE-DOT) 0.0375 MG/24HR PLACE 1 PATCH ONTO THE SKIN 2 (TWO) TIMES A WEEK.  . Glucosamine-Chondroitin 750-600 MG TABS Take 1 tablet by mouth 2 (two) times daily.    Marland Kitchen lisinopril (ZESTRIL) 5 MG tablet TAKE 1 TABLET BY MOUTH EVERY DAY  . meloxicam (MOBIC) 15 MG tablet TAKE 1 TABLET BY MOUTH EVERY DAY  . montelukast (SINGULAIR) 10 MG tablet Take 10 mg by mouth daily.   . Multiple Vitamin (MULTIVITAMIN) tablet Take 1 tablet by mouth daily.    . mupirocin ointment (BACTROBAN) 2 % Apply 1 application topically daily as needed.  . Olopatadine HCl (PATANASE NA) Place 2 sprays into the nose 2 (two) times daily.  . potassium chloride SA (KLOR-CON M20) 20 MEQ tablet TAKE 1 TABLET BY MOUTH ONCE DAILY  . PROAIR HFA 108 (90 BASE) MCG/ACT inhaler USE 2 PUFFS EVERY 4 HOURS AS NEEDED FOR WHEEZING  . QVAR REDIHALER 80 MCG/ACT inhaler   . SYNTHROID 112 MCG tablet Take 112 mcg by mouth daily.  Marland Kitchen tiZANidine (ZANAFLEX) 4 MG tablet TAKE 1 TABLET BY MOUTH EVERY 6 HOURS AS NEEDED  . traMADol (ULTRAM) 50 MG tablet TAKE 1-2 TABLETS BY MOUTH EVERY 8 HOURS AS NEEDED FOR SEVERE(SHOULDER PAIN). CAUTION OF SEDATION  .  triamterene-hydrochlorothiazide (MAXZIDE-25) 37.5-25 MG tablet Take 2 tablets by mouth daily.  . verapamil (CALAN-SR) 240 MG CR tablet TAKE 1 TABLET (240 MG TOTAL) BY MOUTH 2 (TWO) TIMES DAILY.   No facility-administered encounter medications on file as of 07/22/2019.    Allergies (verified) Neosporin [neomycin-polymyxin-gramicidin] and Tape   History: Past Medical History:  Diagnosis Date  . Allergy    allergic rhinitis  .  Anxiety   . Arthritis    OA knee replacement  . Family history of breast cancer   . Family history of genetic disease carrier    paternal cousin has NBN mutation  . GERD (gastroesophageal reflux disease)   . Hyperlipidemia   . Hypertension   . Hypothyroid   . Reactive airways dysfunction syndrome ([place])   . Skin lesions, generalized    squamous cell skin lesions and basal cell skin lesions   Past Surgical History:  Procedure Laterality Date  . ABDOMINAL HYSTERECTOMY  1990s   hyst with oophrectomy for B9lesion/cyst and prolapse  . BREAST BIOPSY Bilateral    benign  . JOINT REPLACEMENT  05/2009   left total knee replacement  . recocele     and cystocele repair  . tsa     Family History  Problem Relation Age of Onset  . Hypertension Mother   . Stroke Mother   . Depression Mother   . Alzheimer's disease Mother   . Heart disease Father   . Cerebral palsy Father   . Hypertension Sister   . Breast cancer Cousin 50       NBN mutation  . Melanoma Maternal Uncle        dx >50   Social History   Socioeconomic History  . Marital status: Widowed    Spouse name: Not on file  . Number of children: Not on file  . Years of education: Not on file  . Highest education level: Not on file  Occupational History  . Not on file  Tobacco Use  . Smoking status: Never Smoker  . Smokeless tobacco: Never Used  Vaping Use  . Vaping Use: Never used  Substance and Sexual Activity  . Alcohol use: No    Alcohol/week: 0.0 standard drinks  . Drug use: No  . Sexual activity: Not Currently  Other Topics Concern  . Not on file  Social History Narrative  . Not on file   Social Determinants of Health   Financial Resource Strain: Low Risk   . Difficulty of Paying Living Expenses: Not hard at all  Food Insecurity: No Food Insecurity  . Worried About Charity fundraiser in the Last Year: Never true  . Ran Out of Food in the Last Year: Never true  Transportation Needs: No Transportation  Needs  . Lack of Transportation (Medical): No  . Lack of Transportation (Non-Medical): No  Physical Activity: Inactive  . Days of Exercise per Week: 0 days  . Minutes of Exercise per Session: 0 min  Stress: No Stress Concern Present  . Feeling of Stress : Not at all  Social Connections:   . Frequency of Communication with Friends and Family:   . Frequency of Social Gatherings with Friends and Family:   . Attends Religious Services:   . Active Member of Clubs or Organizations:   . Attends Archivist Meetings:   Marland Kitchen Marital Status:     Tobacco Counseling Counseling given: Not Answered   Clinical Intake:  Pre-visit preparation completed: Yes  Pain : No/denies pain  Nutritional Risks: None Diabetes: No  How often do you need to have someone help you when you read instructions, pamphlets, or other written materials from your doctor or pharmacy?: 1 - Never What is the last grade level you completed in school?: some college  Diabetic: No Nutrition Risk Assessment:  Has the patient had any N/V/D within the last 2 months?  No  Does the patient have any non-healing wounds?  No  Has the patient had any unintentional weight loss or weight gain?  No   Diabetes:  Is the patient diabetic?  No  If diabetic, was a CBG obtained today?  No  Did the patient bring in their glucometer from home?  No  How often do you monitor your CBG's? N/A.   Financial Strains and Diabetes Management:  Are you having any financial strains with the device, your supplies or your medication? No .  Does the patient want to be seen by Chronic Care Management for management of their diabetes?  No  Would the patient like to be referred to a Nutritionist or for Diabetic Management?  No      Interpreter Needed?: No  Information entered by :: CJohnson, LPN   Activities of Daily Living In your present state of health, do you have any difficulty performing the following activities: 07/22/2019   Hearing? N  Vision? N  Difficulty concentrating or making decisions? N  Walking or climbing stairs? N  Dressing or bathing? N  Doing errands, shopping? N  Preparing Food and eating ? N  Using the Toilet? N  In the past six months, have you accidently leaked urine? N  Do you have problems with loss of bowel control? N  Managing your Medications? N  Managing your Finances? N  Housekeeping or managing your Housekeeping? N  Some recent data might be hidden    Patient Care Team: Tower, Wynelle Fanny, MD as PCP - Silvano Rusk, MD as Consulting Physician (Ophthalmology)  Indicate any recent Medical Services you may have received from other than [facility] providers in the past year (date may be approximate).     Assessment:   This is a routine wellness examination for [place].  Hearing/Vision screen  Hearing Screening   125Hz  250Hz  500Hz  1000Hz  2000Hz  3000Hz  4000Hz  6000Hz  8000Hz   Right ear:           Left ear:           Vision Screening Comments: Patient gets annual eye exams   Dietary issues and exercise activities discussed: Current Exercise Habits: Home exercise routine, Type of exercise: stretching, Time (Minutes): 20, Frequency (Times/Week): 7, Weekly Exercise (Minutes/Week): 140, Intensity: Mild, Exercise limited by: None identified  Goals    . Patient Stated     Starting 07/15/2018, I will continue to take medications as prescribed.     . Patient Stated     07/22/2019, I will maintain and continue medications as prescribed.       Depression Screen PHQ 2/9 Scores 07/22/2019 07/15/2018 07/02/2017 06/23/2016 06/22/2015 05/27/2014 02/21/2013  PHQ - 2 Score 0 0 0 0 0 0 0  PHQ- 9 Score 0 0 0 - - - -    Fall Risk Fall Risk  07/22/2019 07/15/2018 07/02/2017 06/23/2016 06/22/2015  Falls in the past year? 0 0 Yes No No  Comment - - lost balance due to foot being numb; multiple bruises - -  Number falls in past yr: 0 - 1 - -  Injury with Fall? 0 - Yes - -  Risk for fall due to : Medication  side effect - - - -  Follow up Falls evaluation completed;Falls prevention discussed - - - -    Any stairs in or around the home? Yes  If so, are there any without handrails? No  Home free of loose throw rugs in walkways, pet beds, electrical cords, etc? Yes Adequate lighting in your home to reduce risk of falls? Yes  ASSISTIVE DEVICES UTILIZED TO PREVENT FALLS:  Life alert? No  Use of a cane, walker or w/c? No  Grab bars in the bathroom? No  Shower chair or bench in shower? No  Elevated toilet seat or a handicapped toilet? No   TIMED UP AND GO:  Was the test performed? N/A, telephonic visit.    Cognitive Function: MMSE - Mini Mental State Exam 07/22/2019 07/15/2018 07/02/2017 06/23/2016 06/22/2015  Orientation to time 5 5 5 5 5   Orientation to Place 5 5 5 5 5   Registration 3 3 3 3 3   Attention/ Calculation 5 0 0 0 0  Recall 3 3 3 3 3   Language- name 2 objects - 0 0 0 0  Language- repeat 1 1 1 1 1   Language- follow 3 step command - 0 3 3 3   Language- read & follow direction - 0 0 0 0  Write a sentence - 0 0 0 0  Copy design - 0 0 0 0  Total score - 17 20 20 20   Mini Cog  Mini-Cog screen was completed. Maximum score is 22. A value of 0 denotes this part of the MMSE was not completed or the patient failed this part of the Mini-Cog screening.       Immunizations Immunization History  Administered Date(s) Administered  . Influenza Split 10/18/2010  . Influenza Whole 11/30/2006, 10/28/2008, 10/27/2009  . Influenza, High Dose Seasonal PF 11/11/2015, 10/05/2016, 10/18/2017, 11/11/2018  . Influenza-Unspecified 10/07/2012, 08/30/2013, 10/01/2014  . PFIZER SARS-COV-2 Vaccination 02/04/2019, 02/24/2019  . Pneumococcal Conjugate-13 05/27/2014  . Pneumococcal Polysaccharide-23 12/04/2011  . Td 12/04/2011  . Zoster 10/05/2010    TDAP status: Up to date Flu Vaccine status: Up to date Pneumococcal vaccine status: Up to date Covid-19 vaccine status: Completed vaccines  Qualifies  for Shingles Vaccine? Yes   Zostavax completed Yes   Shingrix Completed?: No.    Education has been provided regarding the importance of this vaccine. Patient has been advised to call insurance company to determine out of pocket expense if they have not yet received this vaccine. Advised may also receive vaccine at local pharmacy or Health Dept. Verbalized acceptance and understanding.  Screening Tests Health Maintenance  Topic Date Due  . INFLUENZA VACCINE  08/17/2019  . MAMMOGRAM  11/21/2019  . COLONOSCOPY  08/02/2020  . TETANUS/TDAP  12/03/2021  . DEXA SCAN  Completed  . COVID-19 Vaccine  Completed  . Hepatitis C Screening  Completed  . PNA vac Low Risk Adult  Completed    Health Maintenance  There are no preventive care reminders to display for this patient.  Colorectal cancer screening: Completed 08/03/2015. Repeat every 5 years Mammogram status: Completed 11/21/2018. Repeat every year Bone Density status: due, wants to get this when her mammogram is due in November.   Lung Cancer Screening: (Low Dose CT Chest recommended if Age 48-80 years, 30 pack-year currently smoking OR have quit w/in 15years.) does not qualify.    Additional Screening:  Hepatitis C Screening: does qualify; Completed 06/22/2015  Vision Screening: Recommended annual ophthalmology exams for early  detection of glaucoma and other disorders of the eye. Is the patient up to date with their annual eye exam?  Yes  Who is the provider or what is the name of the office in which the patient attends annual eye exams? [facility] If pt is not established with a provider, would they like to be referred to a provider to establish care? No .   Dental Screening: Recommended annual dental exams for proper oral hygiene  Community Resource Referral / Chronic Care Management: CRR required this visit?  No   CCM required this visit?  No      Plan:     I have personally reviewed and noted the following in  the patient's chart:   . Medical and social history . Use of alcohol, tobacco or illicit drugs  . Current medications and supplements . Functional ability and status . Nutritional status . Physical activity . Advanced directives . List of other physicians . Hospitalizations, surgeries, and ER visits in previous 12 months . Vitals . Screenings to include cognitive, depression, and falls . Referrals and appointments  In addition, I have reviewed and discussed with patient certain preventive protocols, quality metrics, and best practice recommendations. A written personalized care plan for preventive services as well as general preventive health recommendations were provided to patient.    Due to this being a telephonic visit, the after visit summary with patients personalized plan was offered to patient via mail or my-chart.  Patient preferred to pick up at office at next visit.   Andrez Grime, LPN   [id number]

## 2019-07-28 ENCOUNTER — Other Ambulatory Visit: Payer: Self-pay | Admitting: Family Medicine

## 2019-07-29 ENCOUNTER — Encounter: Payer: Self-pay | Admitting: Family Medicine

## 2019-07-29 ENCOUNTER — Ambulatory Visit (INDEPENDENT_AMBULATORY_CARE_PROVIDER_SITE_OTHER): Payer: PPO | Admitting: Family Medicine

## 2019-07-29 ENCOUNTER — Other Ambulatory Visit: Payer: Self-pay

## 2019-07-29 VITALS — BP 128/60 | HR 74 | Temp 97.1°F | Ht 67.5 in | Wt 239.4 lb

## 2019-07-29 DIAGNOSIS — N952 Postmenopausal atrophic vaginitis: Secondary | ICD-10-CM | POA: Diagnosis not present

## 2019-07-29 DIAGNOSIS — E2839 Other primary ovarian failure: Secondary | ICD-10-CM | POA: Diagnosis not present

## 2019-07-29 DIAGNOSIS — M545 Low back pain, unspecified: Secondary | ICD-10-CM

## 2019-07-29 DIAGNOSIS — D329 Benign neoplasm of meninges, unspecified: Secondary | ICD-10-CM

## 2019-07-29 DIAGNOSIS — I10 Essential (primary) hypertension: Secondary | ICD-10-CM | POA: Diagnosis not present

## 2019-07-29 DIAGNOSIS — E7800 Pure hypercholesterolemia, unspecified: Secondary | ICD-10-CM

## 2019-07-29 DIAGNOSIS — R7303 Prediabetes: Secondary | ICD-10-CM

## 2019-07-29 DIAGNOSIS — N951 Menopausal and female climacteric states: Secondary | ICD-10-CM

## 2019-07-29 DIAGNOSIS — Z0000 Encounter for general adult medical examination without abnormal findings: Secondary | ICD-10-CM | POA: Diagnosis not present

## 2019-07-29 DIAGNOSIS — E039 Hypothyroidism, unspecified: Secondary | ICD-10-CM | POA: Diagnosis not present

## 2019-07-29 DIAGNOSIS — Z6836 Body mass index (BMI) 36.0-36.9, adult: Secondary | ICD-10-CM

## 2019-07-29 MED ORDER — ALBUTEROL SULFATE HFA 108 (90 BASE) MCG/ACT IN AERS: INHALATION_SPRAY | RESPIRATORY_TRACT | 3 refills | Status: DC

## 2019-07-29 MED ORDER — POTASSIUM CHLORIDE CRYS ER 20 MEQ PO TBCR: EXTENDED_RELEASE_TABLET | ORAL | 3 refills | Status: DC

## 2019-07-29 MED ORDER — TRIAMTERENE-HCTZ 37.5-25 MG PO TABS: 2.0000 | ORAL_TABLET | Freq: Every day | ORAL | 3 refills | Status: DC

## 2019-07-29 MED ORDER — VERAPAMIL HCL ER 240 MG PO TBCR: EXTENDED_RELEASE_TABLET | ORAL | 3 refills | Status: DC

## 2019-07-29 MED ORDER — TIZANIDINE HCL 4 MG PO TABS: 4.0000 mg | ORAL_TABLET | Freq: Four times a day (QID) | ORAL | 3 refills | Status: DC | PRN

## 2019-07-29 MED ORDER — LISINOPRIL 5 MG PO TABS: 5.0000 mg | ORAL_TABLET | Freq: Every day | ORAL | 3 refills | Status: DC

## 2019-07-29 MED ORDER — ESOMEPRAZOLE MAGNESIUM 40 MG PO CPDR: DELAYED_RELEASE_CAPSULE | ORAL | 3 refills | Status: DC

## 2019-07-29 MED ORDER — ESTRADIOL 0.0375 MG/24HR TD PTTW: MEDICATED_PATCH | TRANSDERMAL | 1 refills | Status: DC

## 2019-07-29 NOTE — Progress Notes (Signed)
Subjective:    Patient ID: Amber Miles, female    DOB: 09-19-1945, 74 y.o.   MRN: [id number]  This visit occurred during the SARS-CoV-2 public health emergency.  Safety protocols were in place, including screening questions prior to the visit, additional usage of staff PPE, and extensive cleaning of exam room while observing appropriate contact time as indicated for disinfecting solutions.    HPI Here for health maintenance exam and to review chronic medical problems    Wt Readings from Last 3 Encounters:  07/29/19 239 lb 7 oz (108.6 kg)  07/15/18 236 lb (107 kg)  07/16/17 230 lb (104.3 kg)   36.95 kg/m   Doing well  Has 2 great grand kids   amw on 7/6 Noted interest in dexa with last mammogram   Mammogram 11/20 Self breast exam -no lumps   Colonoscopy 7/17 with 5 y recall   dexa 3/15-normal [place]-none fx -none  Supplements - takes ca and D as well as mvi  Exercise - does PT exercises for back/legs and shoulders  She does walk while doing housework   zostavax 2012 Has had covid vaccines   HTN bp is stable up on first check (back hurts also)  No cp or palpitations or headaches or edema  No side effects to medicines  BP Readings from Last 3 Encounters:  07/29/19 (!) 142/76  07/15/18 130/70  07/16/17 126/72   lisinopril  maxzide  Verapamil   Hypothyroidism  Pt has no clinical changes No change in energy level/ hair or skin/ edema and no tremor Lab Results  Component Value Date   TSH 2.50 07/22/2019    112 mcg levothy daily  Had recent US- doing well     Past h/o meningioma  No symptoms   Uses vivelle dot for HRT- for menopausal symptoms and atrophic vaginitis  Her gyn told her to do this for thin tissue  She declines change to vaginal yet Understands risks of HRT     Prediabetes Lab Results  Component Value Date   HGBA1C 6.1 07/22/2019   Past 6.2 Diet is good right now -lots of produce  Also chicken Avoids excessive bread  Does  not eat a lot of sweets     Hyperlipidemia Lab Results  Component Value Date   CHOL 163 07/22/2019   CHOL 167 07/08/2018   CHOL 177 07/02/2017   Lab Results  Component Value Date   HDL 42.90 07/22/2019   HDL 43.00 07/08/2018   HDL 44.00 07/02/2017   Lab Results  Component Value Date   LDLCALC 83 07/22/2019   LDLCALC 98 07/08/2018   LDLCALC 101 (H) 07/02/2017   Lab Results  Component Value Date   TRIG 185.0 (H) 07/22/2019   TRIG 130.0 07/08/2018   TRIG 157.0 (H) 07/02/2017   Lab Results  Component Value Date   CHOLHDL 4 07/22/2019   CHOLHDL 4 07/08/2018   CHOLHDL 4 07/02/2017   Lab Results  Component Value Date   LDLDIRECT 105.0 06/15/2015  trig up a bit    Chronic back pain  Needs medication refills  Last refill tramadol was 06/23/19 for 30 pills   Also takes aleve   Also has pain in L shoulder- hurts after she washes her hair -goes into spasm  Hammer toes bother her also  [employer]   Lab Results  Component Value Date   CREATININE 1.07 07/22/2019   BUN 20 07/22/2019   NA 136 07/22/2019   K 3.7 07/22/2019  CL 101 07/22/2019   CO2 27 07/22/2019   Lab Results  Component Value Date   ALT 17 07/22/2019   AST 18 07/22/2019   ALKPHOS 72 07/22/2019   BILITOT 0.4 07/22/2019    Lab Results  Component Value Date   WBC 6.9 07/22/2019   HGB 12.1 07/22/2019   HCT 37.0 07/22/2019   MCV 79.5 07/22/2019   PLT 318.0 07/22/2019   Patient Active Problem List   Diagnosis Date Noted  . Routine general medical examination at a health care facility 07/29/2019  . Genetic testing 01/02/2018  . Family history of breast cancer   . Family history of genetic disease carrier   . Meningioma ([place]) 07/17/2017  . Fall 01/04/2017  . Vasomotor symptoms due to menopause 06/26/2016  . Atrophic vaginitis 09/01/2015  . Pedal edema 05/27/2014  . Estrogen deficiency 02/21/2013  . Encounter for Medicare annual wellness exam 02/21/2013  . Low back pain potentially  associated with radiculopathy 10/11/2012  . ALLERGIC RHINITIS 01/28/2010  . Prediabetes 07/30/2009  . ANXIETY 11/30/2008  . Hyperlipidemia 04/13/2008  . Hypothyroidism 01/07/2008  . Obesity 01/07/2008  . HYPERTENSION, BENIGN 01/07/2008  . 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 Allergy    allergic rhinitis  . Anxiety   . Arthritis    OA knee replacement  . Family history of breast cancer   . Family history of genetic disease carrier    paternal cousin has NBN mutation  . GERD (gastroesophageal reflux disease)   . Hyperlipidemia   . Hypertension   . Hypothyroid   . Reactive airways dysfunction syndrome ([place])   . Skin lesions, generalized    squamous cell skin lesions and basal cell skin lesions   Past Surgical History:  Procedure Laterality Date  . ABDOMINAL HYSTERECTOMY  1990s   hyst with oophrectomy for B9lesion/cyst and prolapse  . BREAST BIOPSY Bilateral    benign  . JOINT REPLACEMENT  05/2009   left total knee replacement  . recocele     and cystocele repair  . tsa     Social History   Tobacco Use  . Smoking status: Never Smoker  . Smokeless tobacco: Never Used  Vaping Use  . Vaping Use: Never used  Substance Use Topics  . Alcohol use: No    Alcohol/week: 0.0 standard drinks  . Drug use: No   Family History  Problem Relation Age of Onset  . Hypertension Mother   . Stroke Mother   . Depression Mother   . Alzheimer's disease Mother   . Heart disease Father   . Cerebral palsy Father   . Hypertension Sister   . Breast cancer Cousin 50       NBN mutation  . Melanoma Maternal Uncle        dx >50   Allergies  Allergen Reactions  . Neosporin [Neomycin-Polymyxin-Gramicidin] Other (See Comments)    blisters  . Tape Swelling    Itching and redness Pt said severe reaction to surgical tape.? latex   Current Outpatient Medications on File Prior to Visit  Medication Sig Dispense Refill  . acyclovir (ZOVIRAX) 800  MG tablet TAKE 1 TABLET BY MOUTH 3 TIMES DAILY FOR 3 DAYS FLARES  2  . amoxicillin (AMOXIL) 500 MG capsule Take 500 mg by mouth as directed.    Marland Kitchen azelastine (ASTELIN) 0.1 % nasal spray Place 1 spray into both nostrils 2 (two) times daily.    Mariane Baumgarten Calcium (STOOL SOFTENER PO)  Take 1 tablet by mouth daily as needed.    Marland Kitchen FLOVENT HFA 110 MCG/ACT inhaler Inhale 1 puff into the lungs in the morning and at bedtime.    . Glucosamine-Chondroitin 750-600 MG TABS Take 1 tablet by mouth 2 (two) times daily.      . montelukast (SINGULAIR) 10 MG tablet Take 10 mg by mouth daily.     . Multiple Vitamin (MULTIVITAMIN) tablet Take 1 tablet by mouth daily.      . mupirocin ointment (BACTROBAN) 2 % Apply 1 application topically daily as needed. 22 g 1  . SYNTHROID 112 MCG tablet Take 112 mcg by mouth daily.  11  . traMADol (ULTRAM) 50 MG tablet TAKE 1-2 TABLETS BY MOUTH EVERY 8 HOURS AS NEEDED FOR SEVERE(SHOULDER PAIN). CAUTION OF SEDATION 30 tablet 0   No current facility-administered medications on file prior to visit.       Review of Systems  Constitutional: Negative for activity change, appetite change, fatigue, fever and unexpected weight change.  HENT: Negative for congestion, ear pain, rhinorrhea, sinus pressure and sore throat.   Eyes: Negative for pain, redness and visual disturbance.  Respiratory: Negative for cough, shortness of breath and wheezing.   Cardiovascular: Negative for chest pain and palpitations.  Gastrointestinal: Negative for abdominal pain, blood in stool, constipation and diarrhea.  Endocrine: Negative for polydipsia and polyuria.  Genitourinary: Negative for dysuria, frequency and urgency.  Musculoskeletal: Positive for arthralgias and back pain. Negative for myalgias.  Skin: Negative for pallor and rash.  Allergic/Immunologic: Negative for environmental allergies.  Neurological: Negative for dizziness, syncope and headaches.  Hematological: Negative for adenopathy. Does  not bruise/bleed easily.  Psychiatric/Behavioral: Negative for decreased concentration and dysphoric mood. The patient is not nervous/anxious.        Objective:   Physical Exam Constitutional:      General: She is not in acute distress.    Appearance: Normal appearance. She is well-developed. She is obese. She is not ill-appearing or diaphoretic.  HENT:     Head: Normocephalic and atraumatic.     Right Ear: Tympanic membrane, ear canal and external ear normal.     Left Ear: Tympanic membrane, ear canal and external ear normal.     Nose: Nose normal. No congestion.     Mouth/Throat:     Mouth: Mucous membranes are moist.     Pharynx: Oropharynx is clear. No posterior oropharyngeal erythema.  Eyes:     General: No scleral icterus.    Extraocular Movements: Extraocular movements intact.     Conjunctiva/sclera: Conjunctivae normal.     Pupils: Pupils are equal, round, and reactive to light.  Neck:     Thyroid: No thyromegaly.     Vascular: No carotid bruit or JVD.  Cardiovascular:     Rate and Rhythm: Normal rate and regular rhythm.     Pulses: Normal pulses.     Heart sounds: Normal heart sounds. No gallop.   Pulmonary:     Effort: Pulmonary effort is normal. No respiratory distress.     Breath sounds: Normal breath sounds. No wheezing.     Comments: Good air exch Chest:     Chest wall: No tenderness.  Abdominal:     General: Bowel sounds are normal. There is no distension or abdominal bruit.     Palpations: Abdomen is soft. There is no mass.     Tenderness: There is no abdominal tenderness.     Hernia: No hernia is present.  Genitourinary:  Comments: Breast exam: No mass, nodules, thickening, tenderness, bulging, retraction, inflamation, nipple discharge or skin changes noted.  No axillary or clavicular LA.     Musculoskeletal:        General: No tenderness. Normal range of motion.     Cervical back: Normal range of motion and neck supple. No rigidity. No muscular  tenderness.     Right lower leg: No edema.     Left lower leg: No edema.  Lymphadenopathy:     Cervical: No cervical adenopathy.  Skin:    General: Skin is warm and dry.     Coloration: Skin is not pale.     Findings: No erythema or rash.     Comments: Solar aging and lentigines diffusely   Neurological:     Mental Status: She is alert. Mental status is at baseline.     Cranial Nerves: No cranial nerve deficit.     Motor: No abnormal muscle tone.     Coordination: Coordination normal.     Gait: Gait normal.     Deep Tendon Reflexes: Reflexes are normal and symmetric. Reflexes normal.  Psychiatric:        Mood and Affect: Mood normal.        Cognition and Memory: Cognition and memory normal.           Assessment & Plan:   Problem List Items Addressed This Visit      Cardiovascular and Mediastinum   HYPERTENSION, BENIGN    bp in fair control at this time  BP Readings from Last 1 Encounters:  07/29/19 128/60   No changes needed Most recent labs reviewed  Disc lifstyle change with low sodium diet and exercise        Relevant Medications   lisinopril (ZESTRIL) 5 MG tablet   triamterene-hydrochlorothiazide (MAXZIDE-25) 37.5-25 MG tablet   verapamil (CALAN-SR) 240 MG CR tablet     Endocrine   Hypothyroidism    Hypothyroidism  Pt has no clinical changes No change in energy level/ hair or skin/ edema and no tremor Lab Results  Component Value Date   TSH 2.50 07/22/2019            Nervous and Auditory   Meningioma ([facility])    No symptoms or evidence of growth        Genitourinary   Atrophic vaginitis    Given option of change to vaginal estrogen and pt declined Continues vivelle dot  Voiced understanding of risks        Other   Hyperlipidemia    Disc goals for lipids and reasons to control them Rev last labs with pt Rev low sat fat diet in detail  Fairly well controlled with diet  Trig up a bit-disc carb control        Relevant Medications    lisinopril (ZESTRIL) 5 MG tablet   triamterene-hydrochlorothiazide (MAXZIDE-25) 37.5-25 MG tablet   verapamil (CALAN-SR) 240 MG CR tablet   Obesity    Discussed how this problem influences overall health and the risks it imposes  Reviewed plan for weight loss with lower calorie diet (via better food choices and also portion control or program like weight watchers) and exercise building up to or more than 30 minutes 5 days per week including some aerobic activity         Prediabetes    Lab Results  Component Value Date   HGBA1C 6.1 07/22/2019   disc imp of low glycemic diet and wt loss to prevent DM2  Low back pain potentially associated with radiculopathy    Pt continues zanaflex prn as well as tramadol  Also has chronic L mid back pain near scapula Disc risks of sedation/falls and habit       Relevant Medications   tiZANidine (ZANAFLEX) 4 MG tablet   Estrogen deficiency    Will order dexa in fall when due for her mammogram      Vasomotor symptoms due to menopause    Disc potential risks/benefits of HRT incl good for bone health but inc risk of CV dz/clots and breast cancer  Given the option to change to vaginal estrogen-pt declined  Voiced understanding of risks and wishes to continue vivelle dot for HRT  She states the quality of life is worth the risks       Routine general medical examination at a health care facility - Primary    Reviewed health habits including diet and exercise and skin cancer prevention Reviewed appropriate screening tests for age  Also reviewed health mt list, fam hx and immunization status , as well as social and family history   See HPI amw reviewed  Labs reviewed  Will plan dexa with next mammogram in the fall  Discussed risks /benefits of HRT Enc ca and D for bone health as well as exercise  Encouraged wt loss

## 2019-07-29 NOTE — Assessment & Plan Note (Signed)
Pt continues zanaflex prn as well as tramadol  Also has chronic L mid back pain near scapula Disc risks of sedation/falls and habit

## 2019-07-29 NOTE — Assessment & Plan Note (Signed)
Disc goals for lipids and reasons to control them Rev last labs with pt Rev low sat fat diet in detail  Fairly well controlled with diet  Trig up a bit-disc carb control

## 2019-07-29 NOTE — Assessment & Plan Note (Addendum)
No symptoms or evidence of growth

## 2019-07-29 NOTE — Assessment & Plan Note (Signed)
Discussed how this problem influences overall health and the risks it imposes  Reviewed plan for weight loss with lower calorie diet (via better food choices and also portion control or program like weight watchers) and exercise building up to or more than 30 minutes 5 days per week including some aerobic activity    

## 2019-07-29 NOTE — Assessment & Plan Note (Signed)
Given option of change to vaginal estrogen and pt declined Continues vivelle dot  Voiced understanding of risks

## 2019-07-29 NOTE — Patient Instructions (Addendum)
Call us when you schedule your mammogram and I will do bone density test referral   If you are interested in the new shingles vaccine (Shingrix) - call your local pharmacy to check on coverage and availability  If affordable, get on a wait list at your pharmacy to get the vaccine.  To prevent diabetes Try to get most of your carbohydrates from produce (with the exception of white potatoes)  Eat less bread/pasta/rice/snack foods/cereals/sweets and other items from the middle of the grocery store (processed carbs)  Take care of yourself  Stay as active as you can be

## 2019-07-29 NOTE — Assessment & Plan Note (Signed)
Disc potential risks/benefits of HRT incl good for bone health but inc risk of CV dz/clots and breast cancer  Given the option to change to vaginal estrogen-pt declined  Voiced understanding of risks and wishes to continue vivelle dot for HRT  She states the quality of life is worth the risks

## 2019-07-29 NOTE — Assessment & Plan Note (Signed)
bp in fair control at this time  BP Readings from Last 1 Encounters:  07/29/19 128/60   No changes needed Most recent labs reviewed  Disc lifstyle change with low sodium diet and exercise

## 2019-07-29 NOTE — Assessment & Plan Note (Signed)
Will order dexa in fall when due for her mammogram

## 2019-07-29 NOTE — Assessment & Plan Note (Signed)
Hypothyroidism  Pt has no clinical changes No change in energy level/ hair or skin/ edema and no tremor Lab Results  Component Value Date   TSH 2.50 07/22/2019

## 2019-07-29 NOTE — Assessment & Plan Note (Signed)
Lab Results  Component Value Date   HGBA1C 6.1 07/22/2019   disc imp of low glycemic diet and wt loss to prevent DM2

## 2019-07-29 NOTE — Assessment & Plan Note (Signed)
Reviewed health habits including diet and exercise and skin cancer prevention Reviewed appropriate screening tests for age  Also reviewed health mt list, fam hx and immunization status , as well as social and family history   See HPI amw reviewed  Labs reviewed  Will plan dexa with next mammogram in the fall  Discussed risks /benefits of HRT Enc ca and D for bone health as well as exercise  Encouraged wt loss

## 2019-07-31 ENCOUNTER — Telehealth: Payer: Self-pay

## 2019-07-31 MED ORDER — ZOSTER VAC RECOMB ADJUVANTED 50 MCG/0.5ML IM SUSR: 0.5000 mL | Freq: Once | INTRAMUSCULAR | 1 refills | Status: AC

## 2019-07-31 NOTE — Telephone Encounter (Signed)
Pt left v/m was seen on 07/29/19 and pt cked with ins co about shingles vaccine (Shingrix); pt needs Shingrix rx sent to [name] and then pt can get the Shingles shot.

## 2019-07-31 NOTE — Telephone Encounter (Signed)
I sent it  

## 2019-08-05 ENCOUNTER — Other Ambulatory Visit: Payer: Self-pay | Admitting: Neurosurgery

## 2019-08-05 DIAGNOSIS — D329 Benign neoplasm of meninges, unspecified: Secondary | ICD-10-CM

## 2019-08-06 ENCOUNTER — Other Ambulatory Visit: Payer: Self-pay | Admitting: Family Medicine

## 2019-08-06 NOTE — Telephone Encounter (Signed)
Name of [place] Name of Pharmacy:[facility] or Written Date and Quantity:06/23/19 #30 tabs with 0 refills Last Office Visit and Type:CPE on 07/29/19 Next Office Visit and Type:n/a Last Controlled Substance Agreement Date:12/09/12 Last UDS:12/09/12

## 2019-08-20 ENCOUNTER — Ambulatory Visit
Admission: RE | Admit: 2019-08-20 | Discharge: 2019-08-20 | Disposition: A | Discharge status: Home / Self Care | Payer: PPO | Source: Ambulatory Visit | Attending: Neurosurgery | Admitting: Neurosurgery

## 2019-08-20 ENCOUNTER — Other Ambulatory Visit: Payer: Self-pay

## 2019-08-20 DIAGNOSIS — D329 Benign neoplasm of meninges, unspecified: Secondary | ICD-10-CM

## 2019-08-20 DIAGNOSIS — G9389 Other specified disorders of brain: Secondary | ICD-10-CM | POA: Diagnosis not present

## 2019-08-20 DIAGNOSIS — R220 Localized swelling, mass and lump, head: Secondary | ICD-10-CM | POA: Diagnosis not present

## 2019-08-20 MED ORDER — GADOBUTROL 1 MMOL/ML IV SOLN
10.0000 mL | Freq: Once | INTRAVENOUS | Status: AC | PRN
  Administered 2019-08-20: 10 mL via INTRAVENOUS

## 2019-08-25 DIAGNOSIS — D320 Benign neoplasm of cerebral meninges: Secondary | ICD-10-CM | POA: Diagnosis not present

## 2019-09-12 DIAGNOSIS — E669 Obesity, unspecified: Secondary | ICD-10-CM | POA: Diagnosis not present

## 2019-09-12 DIAGNOSIS — E890 Postprocedural hypothyroidism: Secondary | ICD-10-CM | POA: Diagnosis not present

## 2019-09-12 DIAGNOSIS — K229 Disease of esophagus, unspecified: Secondary | ICD-10-CM | POA: Diagnosis not present

## 2019-09-12 DIAGNOSIS — J4520 Mild intermittent asthma, uncomplicated: Secondary | ICD-10-CM | POA: Diagnosis not present

## 2019-09-12 DIAGNOSIS — E042 Nontoxic multinodular goiter: Secondary | ICD-10-CM | POA: Diagnosis not present

## 2019-09-12 DIAGNOSIS — E0521 Thyrotoxicosis with toxic multinodular goiter with thyrotoxic crisis or storm: Secondary | ICD-10-CM | POA: Diagnosis not present

## 2019-09-12 DIAGNOSIS — N958 Other specified menopausal and perimenopausal disorders: Secondary | ICD-10-CM | POA: Diagnosis not present

## 2019-09-12 DIAGNOSIS — I10 Essential (primary) hypertension: Secondary | ICD-10-CM | POA: Diagnosis not present

## 2019-09-12 DIAGNOSIS — D34 Benign neoplasm of thyroid gland: Secondary | ICD-10-CM | POA: Diagnosis not present

## 2019-09-23 ENCOUNTER — Other Ambulatory Visit: Payer: Self-pay | Admitting: Family Medicine

## 2019-09-24 DIAGNOSIS — E669 Obesity, unspecified: Secondary | ICD-10-CM | POA: Diagnosis not present

## 2019-09-24 DIAGNOSIS — Z6835 Body mass index (BMI) 35.0-35.9, adult: Secondary | ICD-10-CM | POA: Diagnosis not present

## 2019-09-24 DIAGNOSIS — K229 Disease of esophagus, unspecified: Secondary | ICD-10-CM | POA: Diagnosis not present

## 2019-09-24 DIAGNOSIS — N958 Other specified menopausal and perimenopausal disorders: Secondary | ICD-10-CM | POA: Diagnosis not present

## 2019-09-24 DIAGNOSIS — D34 Benign neoplasm of thyroid gland: Secondary | ICD-10-CM | POA: Diagnosis not present

## 2019-09-24 DIAGNOSIS — I10 Essential (primary) hypertension: Secondary | ICD-10-CM | POA: Diagnosis not present

## 2019-09-24 DIAGNOSIS — J4520 Mild intermittent asthma, uncomplicated: Secondary | ICD-10-CM | POA: Diagnosis not present

## 2019-09-24 DIAGNOSIS — E0521 Thyrotoxicosis with toxic multinodular goiter with thyrotoxic crisis or storm: Secondary | ICD-10-CM | POA: Diagnosis not present

## 2019-09-24 DIAGNOSIS — E890 Postprocedural hypothyroidism: Secondary | ICD-10-CM | POA: Diagnosis not present

## 2019-09-24 DIAGNOSIS — E042 Nontoxic multinodular goiter: Secondary | ICD-10-CM | POA: Diagnosis not present

## 2019-10-13 ENCOUNTER — Other Ambulatory Visit: Payer: Self-pay | Admitting: Family Medicine

## 2019-10-13 ENCOUNTER — Telehealth: Payer: Self-pay

## 2019-10-13 DIAGNOSIS — E2839 Other primary ovarian failure: Secondary | ICD-10-CM

## 2019-10-13 DIAGNOSIS — Z1231 Encounter for screening mammogram for malignant neoplasm of breast: Secondary | ICD-10-CM

## 2019-10-13 NOTE — Telephone Encounter (Signed)
Pt notified order in and she can schedule DEXA

## 2019-10-13 NOTE — Telephone Encounter (Signed)
Pt left v/m that when had annual exam on 07/29/2019 was advised Dr Glori Bickers would order dexa with mammo in fall. Pt request order for dexa to be put in so pt can call breast center and schedule dexa appt. Pt request cb when done.

## 2019-10-13 NOTE — Telephone Encounter (Signed)
Order is done.

## 2019-10-28 ENCOUNTER — Other Ambulatory Visit: Payer: Self-pay | Admitting: Family Medicine

## 2019-10-30 ENCOUNTER — Telehealth: Payer: Self-pay | Admitting: Genetic Counselor

## 2019-10-30 ENCOUNTER — Ambulatory Visit: Payer: Self-pay | Admitting: Genetic Counselor

## 2019-10-30 DIAGNOSIS — Z1379 Encounter for other screening for genetic and chromosomal anomalies: Secondary | ICD-10-CM

## 2019-10-30 NOTE — Progress Notes (Addendum)
HPI:  [name] was previously seen in the [facility] due to a family history of cancer and concerns regarding a hereditary predisposition to cancer. Please refer to our prior cancer genetics clinic note for more information regarding our discussion, assessment and recommendations, at the time. [name]'s recent genetic test results were disclosed to her, as were recommendations warranted by these results. These results and recommendations are discussed in more detail below.  CANCER HISTORY:  Oncology History   No history exists.    FAMILY HISTORY:  We obtained a detailed, 4-generation family history.  Significant diagnoses are listed below: Family History  Problem Relation Age of Onset  . Hypertension Mother   . Stroke Mother   . Depression Mother   . Alzheimer's disease Mother   . Heart disease Father   . Cerebral palsy Father   . Hypertension Sister   . Breast cancer Cousin 50       NBN mutation  . Melanoma Maternal Uncle        dx >50    [name] has 2 daughters ages 50 and 48 with no hx of cancer.  She has 5 grandchildren and 1 great-grandchild. [name] has a brother and a sister who have each had a few colon polyps, but no hx of cancer.   [name]'s father: died at 79 due to CP and heart disease.  Paternal Aunts/Uncles: 4 paternal aunts, 2 paternal uncles. No hx of cancer Paternal cousins: 1 paternal cousin, Amber Miles (mentioned above) was dx with triple neg breast cancer at 50 and had genetic testing.  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  A variant of uncertain significance in BRCA1 was also identified c.77T>C (p.Ile26Thr).   There may also be a paternal cousin who was dx with pancreatic cancer >50.  Paternal grandfather: died in his 90's due to kidney disease.  Paternal grandmother:no hx of cancer, died in her 50's  [name]'s mother: died with Alzheimer's disease.  Maternal Aunts/Uncles: 2 maternal aunts, 2 maternal uncles.   1 maternal aunt might have had cancer, unk.  A maternal uncle had melanoma on his back dx older than 50.  Maternal cousins: no hx of cancer.  Maternal grandfather: lung cancer, hx of smoking, died at 72 Maternal grandmother:no hx of cancer, died in her 80;s  Patient's maternal ancestors are of English descent, and paternal ancestors are of German/Caucasian descent. There is no reported Ashkenazi Jewish ancestry. There is no known consanguinity.  GENETIC TEST RESULTS: Amended genetic testing reported out onOctober 8, 2021through the hereditary cancer panel found onepathogenic mutationsassociated with autosomal recessive NBN-related conditions, but NO pathogenic mutations associated with autosomal dominant cancer syndromes. The Multi-Gene Panel offered by [facility] includes sequencing and/or deletion duplication testing of the following 85 genes: AIP, ALK, APC, ATM, AXIN2,BAP1,  BARD1, BLM, BMPR1A, BRCA1, BRCA2, BRIP1, CASR, CDC73, CDH1, CDK4, CDKN1B, CDKN1C, CDKN2A (p14ARF), CDKN2A (p16INK4a), CEBPA, CHEK2, CTNNA1, DICER1, DIS3L2, EGFR (c.2369C>T, p.Thr790Met variant only), EPCAM (Deletion/duplication testing only), FH, FLCN, GATA2, GPC3, GREM1 (Promoter region deletion/duplication testing only), HOXB13 (c.251G>A, p.Gly84Glu), HRAS, KIT, MAX, MEN1, MET, MITF (c.952G>A, p.Glu318Lys variant only), MLH1, MSH2, MSH3, MSH6, MUTYH, NBN, NF1, NF2, NTHL1, PALB2, PDGFRA, PHOX2B, PMS2, POLD1, POLE, POT1, PRKAR1A, PTCH1, PTEN, RAD50, RAD51C, RAD51D, RB1, RECQL4, RET, RNF43, RUNX1, SDHAF2, SDHA (sequence changes only), SDHB, SDHC, SDHD, SMAD4, SMARCA4, SMARCB1, SMARCE1, STK11, SUFU, TERC, TERT, TMEM127, TP53, TSC1, TSC2, VHL, WRN and WT1. The test report has been scanned into EPIC and is   located under the Molecular Pathology section of the Results Review tab.  A portion of the result report is included below for reference.     We discussed with [name] that because current genetic testing is not perfect, it is  possible there may be a gene mutation in one of these genes that current testing cannot detect, but that chance is small.  We also discussed, that there could be another gene that has not yet been discovered, or that we have not yet tested, that is responsible for the cancer diagnoses in the family. It is also possible there is a hereditary cause for the cancer in the family that [name] did not inherit and therefore was not identified in her testing.  Therefore, it is important to remain in touch with cancer genetics in the future so that we can continue to offer [name] the most up to date genetic testing.   Individuals with a pathogenic variant in NBN are also carriers of Nijmegen breakage syndrome (NBS). NBS is an autosomal recessive condition that results when an individual inherits a pathogenic NBN variant from each parent. It is a progressive multisystemic disorder that characterized by physical abnormalities, immunodeficiency, increased cancer risk, and cognitive impairment (PMID: 20301355). Physical abnormalities may include progressive microcephaly, early growth retardation leading to short stature, and distinctive facial features. Immunodeficiency often results in recurrent pulmonary infections (PMID: 20301355). B-cell lymphomas are the most common malignancy, although T-cell lymphomas, medulloblastomas, gliomas, and rhabdomyosarcomas may also occur (PMID: 20301355). The offspring can only develop NBS if both parents have a pathogenic variant in NBN; in this case, the risk of having an affected child is 25%.  ADDITIONAL GENETIC TESTING: We discussed with [name] that there are other genes that are associated with increased cancer risk that can be analyzed. Should [name] wish to pursue additional genetic testing, we are happy to discuss and coordinate this testing, at any time.    CANCER SCREENING RECOMMENDATIONS: [name]'s test result is considered negative (normal).  This means that we have not  identified a hereditary cause for her family history of cancer at this time. Most cancers happen by chance and this negative test suggests that her cancer may fall into this category.  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  While reassuring, this does not definitively rule out a hereditary predisposition to cancer. It is still possible that there could be genetic mutations that are undetectable by current technology. There could be genetic mutations in genes that have not been tested or identified to increase cancer risk.  Therefore, it is recommended she continue to follow the cancer management and screening guidelines provided by her primary healthcare provider.   An individual's cancer risk and medical management are not determined by genetic test results alone. Overall cancer risk assessment incorporates additional factors, including personal medical history, family history, and any available genetic information that may result in a personalized plan for cancer prevention and surveillance  RECOMMENDATIONS FOR FAMILY MEMBERS:  Knowing if an NBN pathogenic variant is present is advantageous.Ms.Amber Miles's siblings and daughters havea 50% chance to have inherited this mutation. We recommend theyhave genetic testing for this same mutation.  Identifying the presence of this mutation would allowthemtounderstandtheirrisks for subsequent generations to have an NBN-related condition.  Individuals in this family might be at some increased risk of developing cancer, over the general population risk, simply due to the family history of cancer.  We recommended women in this family have a yearly   mammogram beginning at age 40, or 10 years younger than the earliest onset of cancer, an annual clinical breast exam, and perform monthly breast self-exams. Women in this family should also have a gynecological exam as recommended by their primary provider. All family members should be referred for  colonoscopy starting at age 45.  FOLLOW-UP: Lastly, we discussed with [name] that cancer genetics is a rapidly advancing field and it is possible that new genetic tests will be appropriate for her and/or her family members in the future. We encouraged her to remain in contact with cancer genetics on an annual basis so we can update her personal and family histories and let her know of advances in cancer genetics that may benefit this family.   Our contact number was provided. [name]'s questions were answered to her satisfaction, and she knows she is welcome to call us at anytime with additional questions or concerns.   Karen Powell, MS, LCGC Licensed, Certified Genetic Counselor [email or web address]  

## 2019-10-30 NOTE — Telephone Encounter (Signed)
Revealed that there is an update to the NBN testing result.  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

## 2019-11-06 ENCOUNTER — Telehealth: Payer: Self-pay | Admitting: Family Medicine

## 2019-11-06 NOTE — Telephone Encounter (Signed)
Patient called stating that she would like to get a referral for PT for shoulder blade pain. I have inform patient that she would need appointment but she insisted to asked Dr Glori Bickers first. Please advise.

## 2019-11-06 NOTE — Telephone Encounter (Signed)
I need to see her to evaluate what is going on so I know what she may need PT wise

## 2019-11-07 NOTE — Telephone Encounter (Signed)
Amber Miles please schedule an appt

## 2019-11-12 ENCOUNTER — Ambulatory Visit (INDEPENDENT_AMBULATORY_CARE_PROVIDER_SITE_OTHER): Payer: PPO | Admitting: Family Medicine

## 2019-11-12 ENCOUNTER — Encounter: Payer: Self-pay | Admitting: Family Medicine

## 2019-11-12 ENCOUNTER — Ambulatory Visit (INDEPENDENT_AMBULATORY_CARE_PROVIDER_SITE_OTHER)
Admission: RE | Admit: 2019-11-12 | Discharge: 2019-11-12 | Disposition: A | Discharge status: Home / Self Care | Payer: PPO | Source: Ambulatory Visit | Attending: Family Medicine | Admitting: Family Medicine

## 2019-11-12 ENCOUNTER — Other Ambulatory Visit: Payer: Self-pay

## 2019-11-12 DIAGNOSIS — G8929 Other chronic pain: Secondary | ICD-10-CM

## 2019-11-12 DIAGNOSIS — M47812 Spondylosis without myelopathy or radiculopathy, cervical region: Secondary | ICD-10-CM | POA: Diagnosis not present

## 2019-11-12 DIAGNOSIS — M4184 Other forms of scoliosis, thoracic region: Secondary | ICD-10-CM | POA: Diagnosis not present

## 2019-11-12 DIAGNOSIS — M47814 Spondylosis without myelopathy or radiculopathy, thoracic region: Secondary | ICD-10-CM | POA: Diagnosis not present

## 2019-11-12 DIAGNOSIS — M546 Pain in thoracic spine: Secondary | ICD-10-CM

## 2019-11-12 MED ORDER — MUPIROCIN 2 % EX OINT: 1.0000 "application " | TOPICAL_OINTMENT | Freq: Two times a day (BID) | CUTANEOUS | 1 refills | Status: DC

## 2019-11-12 NOTE — Progress Notes (Signed)
Subjective:    Patient ID: Amber Miles, female    DOB: [birth year], 74 y.o.   MRN: [id number]  This visit occurred during the SARS-CoV-2 public health emergency.  Safety protocols were in place, including screening questions prior to the visit, additional usage of staff PPE, and extensive cleaning of exam room while observing appropriate contact time as indicated for disinfecting solutions.    HPI Pt presents for pain in area of L scapula with spasms  Wt Readings from Last 3 Encounters:  11/12/19 225 lb 3 oz (102.1 kg)  07/29/19 239 lb 7 oz (108.6 kg)  07/15/18 236 lb (107 kg)   34.75 kg/m  This pain has been chronic in the past  In past- tramadol and zanaflex for low back pain/also for this  Took aleve for a while-stopped it  Has had on/off for years  Dropping tobacco plants originally  Pain starts on the left and then moves to the right   Is R handed   Recently worse (oct 9)  Had to skip a wedding (cannot sit in a rigid chair)  She does exercises for it  Most comfortable- heating pad flat on her back  Has a massage chair liner  Memory foam pillow   Cannot bend over her sink  Has to peel her potatoes in the recliner    Last did physical therapy (with [facility]) -it helped then and she improved for a long time  2017  Saw Dr Amber Miles (diagnosed with scapular dyskinesis)   Has appt for low back upcoming ortho (next week)  Dr Amber Miles (has had several shots)   Patient Active Problem List   Diagnosis Date Noted  . Left-sided thoracic back pain 11/12/2019  . Routine general medical examination at a health care facility 07/29/2019  . Genetic testing 01/02/2018  . Family history of breast cancer   . Family history of genetic disease carrier   . Meningioma ([place]) 07/17/2017  . Fall 01/04/2017  . Vasomotor symptoms due to menopause 06/26/2016  . Atrophic vaginitis 09/01/2015  . Pedal edema 05/27/2014  . Estrogen deficiency 02/21/2013  . Encounter for Medicare annual  wellness exam 02/21/2013  . Low back pain potentially associated with radiculopathy 10/11/2012  . ALLERGIC RHINITIS 01/28/2010  . Prediabetes 07/30/2009  . ANXIETY 11/30/2008  . Hyperlipidemia 04/13/2008  . Hypothyroidism 01/07/2008  . Obesity 01/07/2008  . HYPERTENSION, BENIGN 01/07/2008  . 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 Allergy    allergic rhinitis  . Anxiety   . Arthritis    OA knee replacement  . Family history of breast cancer   . Family history of genetic disease carrier    paternal cousin has NBN mutation  . GERD (gastroesophageal reflux disease)   . Hyperlipidemia   . Hypertension   . Hypothyroid   . Reactive airways dysfunction syndrome ([place])   . Skin lesions, generalized    squamous cell skin lesions and basal cell skin lesions   Past Surgical History:  Procedure Laterality Date  . ABDOMINAL HYSTERECTOMY  1990s   hyst with oophrectomy for B9lesion/cyst and prolapse  . BREAST BIOPSY Bilateral    benign  . JOINT REPLACEMENT  05/2009   left total knee replacement  . recocele     and cystocele repair  . tsa     Social History   Tobacco Use  . Smoking status: Never Smoker  . Smokeless tobacco: Never Used  Vaping Use  . Vaping Use:  Never used  Substance Use Topics  . Alcohol use: No    Alcohol/week: 0.0 standard drinks  . Drug use: No   Family History  Problem Relation Age of Onset  . Hypertension Mother   . Stroke Mother   . Depression Mother   . Alzheimer's disease Mother   . Heart disease Father   . Cerebral palsy Father   . Hypertension Sister   . Breast cancer Cousin 50       NBN mutation  . Melanoma Maternal Uncle        dx >50   Allergies  Allergen Reactions  . Neosporin [Neomycin-Polymyxin-Gramicidin] Other (See Comments)    blisters  . Tape Swelling    Itching and redness Pt said severe reaction to surgical tape.? latex   Current Outpatient Medications on File Prior to Visit    Medication Sig Dispense Refill  . acetaminophen (TYLENOL) 325 MG tablet Take 650 mg by mouth every 6 (six) hours as needed.    Marland Kitchen acyclovir (ZOVIRAX) 800 MG tablet TAKE 1 TABLET BY MOUTH 3 TIMES DAILY FOR 3 DAYS FLARES  2  . albuterol (PROAIR HFA) 108 (90 Base) MCG/ACT inhaler USE 2 PUFFS EVERY 4 HOURS AS NEEDED FOR WHEEZING 18 g 3  . amoxicillin (AMOXIL) 500 MG capsule Take 500 mg by mouth as directed.    Marland Kitchen azelastine (ASTELIN) 0.1 % nasal spray Place 1 spray into both nostrils 2 (two) times daily.    Amber Miles Calcium (STOOL SOFTENER PO) Take 1 tablet by mouth daily as needed.    Marland Kitchen esomeprazole (NEXIUM) 40 MG capsule TAKE ONE CAPSULE BY MOUTH EVERY MORNING BEFORE BREAKFAST 90 capsule 3  . estradiol (VIVELLE-DOT) 0.0375 MG/24HR PLACE 1 PATCH ONTO THE SKIN 2 (TWO) TIMES A WEEK. 24 patch 1  . FLOVENT HFA 110 MCG/ACT inhaler Inhale 1 puff into the lungs in the morning and at bedtime.    . Glucosamine-Chondroitin 750-600 MG TABS Take 1 tablet by mouth 2 (two) times daily.      Marland Kitchen lisinopril (ZESTRIL) 5 MG tablet Take 1 tablet (5 mg total) by mouth daily. 90 tablet 3  . montelukast (SINGULAIR) 10 MG tablet Take 10 mg by mouth daily.     . Multiple Vitamin (MULTIVITAMIN) tablet Take 1 tablet by mouth daily.      . mupirocin ointment (BACTROBAN) 2 % Apply 1 application topically daily as needed. 22 g 1  . potassium chloride SA (KLOR-CON M20) 20 MEQ tablet TAKE 1 TABLET BY MOUTH ONCE DAILY 90 tablet 3  . SYNTHROID 112 MCG tablet Take 112 mcg by mouth daily.  11  . tiZANidine (ZANAFLEX) 4 MG tablet Take 1 tablet (4 mg total) by mouth every 6 (six) hours as needed. 120 tablet 3  . traMADol (ULTRAM) 50 MG tablet TAKE 1-2 TABLETS BY MOUTH EVERY 8 HOURS AS NEEDED FOR SEVERE(SHOULDER PAIN). CAUTION OF SEDATION 30 tablet 0  . triamterene-hydrochlorothiazide (MAXZIDE-25) 37.5-25 MG tablet Take 2 tablets by mouth daily. 180 tablet 3  . verapamil (CALAN-SR) 240 MG CR tablet TAKE 1 TABLET (240 MG TOTAL) BY MOUTH  2 (TWO) TIMES DAILY. 180 tablet 3   No current facility-administered medications on file prior to visit.     Review of Systems  Constitutional: Negative for activity change, appetite change, fatigue, fever and unexpected weight change.  HENT: Negative for congestion, ear pain, rhinorrhea, sinus pressure and sore throat.   Eyes: Negative for pain, redness and visual disturbance.  Respiratory: Negative for cough, shortness  of breath and wheezing.   Cardiovascular: Negative for chest pain and palpitations.  Gastrointestinal: Negative for abdominal pain, blood in stool, constipation and diarrhea.  Endocrine: Negative for polydipsia and polyuria.  Genitourinary: Negative for dysuria, frequency and urgency.  Musculoskeletal: Positive for back pain. Negative for arthralgias, gait problem, joint swelling and myalgias.  Skin: Negative for pallor and rash.  Allergic/Immunologic: Negative for environmental allergies.  Neurological: Negative for dizziness, syncope and headaches.  Hematological: Negative for adenopathy. Does not bruise/bleed easily.  Psychiatric/Behavioral: Negative for decreased concentration and dysphoric mood. The patient is not nervous/anxious.        Objective:   Physical Exam Constitutional:      General: She is not in acute distress.    Appearance: Normal appearance. She is obese. She is not ill-appearing.  HENT:     Head: Normocephalic and atraumatic.     Mouth/Throat:     Mouth: Mucous membranes are moist.  Eyes:     General: No scleral icterus.    Conjunctiva/sclera: Conjunctivae normal.     Pupils: Pupils are equal, round, and reactive to light.  Cardiovascular:     Rate and Rhythm: Normal rate and regular rhythm.  Pulmonary:     Effort: Pulmonary effort is normal. No respiratory distress.     Breath sounds: Normal breath sounds. No wheezing.  Musculoskeletal:        General: Tenderness present.     Cervical back: Normal range of motion and neck supple. No  rigidity, spasms or tenderness. Normal range of motion.     Thoracic back: Spasms and tenderness present. No swelling, deformity, signs of trauma or bony tenderness. Normal range of motion. No scoliosis.     Right lower leg: No edema.     Left lower leg: No edema.     Comments: Tender in musculature medial to L scapula with some spasm  Rhomboid stretch is helpful and targets area  Nl rom of spine and UEs and neck  No skin changes  No pain with inspiration and no rib tenderness   Lymphadenopathy:     Cervical: No cervical adenopathy.  Skin:    Coloration: Skin is not pale.     Findings: No erythema or rash.  Neurological:     Mental Status: She is alert.     Cranial Nerves: No cranial nerve deficit.     Sensory: No sensory deficit.     Motor: No weakness.     Deep Tendon Reflexes: Reflexes normal.  Psychiatric:        Mood and Affect: Mood normal.           Assessment & Plan:   Problem List Items Addressed This Visit      Other   Left-sided thoracic back pain    Acute on chronic L thoracic (scapula area) pain with past h/o scapular dyskinesis improved with PT Re ref to PT at [facility] (helpful in past) TS xray given length of symptoms  Heat/massage prn  tizanadine prn with caution of sedation  inst to update if symptoms worsen or change in the meantime       Relevant Medications   acetaminophen (TYLENOL) 325 MG tablet   Other Relevant Orders   DG Thoracic Spine W/Swimmers   Ambulatory referral to Physical Therapy

## 2019-11-12 NOTE — Assessment & Plan Note (Signed)
Acute on chronic L thoracic (scapula area) pain with past h/o scapular dyskinesis improved with PT Re ref to PT at [facility] (helpful in past) TS xray given length of symptoms  Heat/massage prn  tizanadine prn with caution of sedation  inst to update if symptoms worsen or change in the meantime

## 2019-11-12 NOTE — Patient Instructions (Addendum)
Continue heat and massage   I placed a PT referral -the office will call you to set that up  Also let's get an xray today   Try the stretch for rhomboid that we did in the office

## 2019-11-18 DIAGNOSIS — M5416 Radiculopathy, lumbar region: Secondary | ICD-10-CM | POA: Diagnosis not present

## 2019-11-18 DIAGNOSIS — M545 Low back pain, unspecified: Secondary | ICD-10-CM | POA: Diagnosis not present

## 2019-11-24 ENCOUNTER — Ambulatory Visit: Payer: PPO

## 2019-11-24 DIAGNOSIS — M545 Low back pain, unspecified: Secondary | ICD-10-CM | POA: Diagnosis not present

## 2019-12-01 ENCOUNTER — Ambulatory Visit
Admission: RE | Admit: 2019-12-01 | Discharge: 2019-12-01 | Disposition: A | Discharge status: Home / Self Care | Payer: PPO | Source: Ambulatory Visit | Attending: Family Medicine | Admitting: Family Medicine

## 2019-12-01 ENCOUNTER — Other Ambulatory Visit: Payer: Self-pay

## 2019-12-01 DIAGNOSIS — Z1231 Encounter for screening mammogram for malignant neoplasm of breast: Secondary | ICD-10-CM

## 2019-12-16 DIAGNOSIS — M47817 Spondylosis without myelopathy or radiculopathy, lumbosacral region: Secondary | ICD-10-CM | POA: Diagnosis not present

## 2019-12-16 DIAGNOSIS — M545 Low back pain, unspecified: Secondary | ICD-10-CM | POA: Diagnosis not present

## 2019-12-19 DIAGNOSIS — M47817 Spondylosis without myelopathy or radiculopathy, lumbosacral region: Secondary | ICD-10-CM | POA: Diagnosis not present

## 2019-12-19 DIAGNOSIS — M545 Low back pain, unspecified: Secondary | ICD-10-CM | POA: Diagnosis not present

## 2019-12-23 ENCOUNTER — Ambulatory Visit: Payer: PPO | Attending: Family Medicine

## 2019-12-23 ENCOUNTER — Other Ambulatory Visit: Payer: Self-pay

## 2019-12-23 DIAGNOSIS — M898X1 Other specified disorders of bone, shoulder: Secondary | ICD-10-CM | POA: Diagnosis not present

## 2019-12-23 DIAGNOSIS — M546 Pain in thoracic spine: Secondary | ICD-10-CM | POA: Insufficient documentation

## 2019-12-23 NOTE — Therapy (Signed)
[facility] [address] [place], [address] Phone: [phone]   Fax:  [phone]  Physical Therapy Evaluation  Patient Details  Name: Amber Miles MRN: [id number] Date of Birth: 02/12/1945 Referring Provider (PT): Loura Pardon, MD   Encounter Date: 12/23/2019   PT End of Session - 12/23/19 1620    Visit Number 1    Number of Visits 17    Date for PT Re-Evaluation 02/19/20    Authorization Type 1    Authorization Time Period of 10 progress report    PT Start Time 1621    PT Stop Time 1718    PT Time Calculation (min) 57 min    Activity Tolerance Patient tolerated treatment well    Behavior During Therapy [facility] for tasks assessed/performed           Past Medical History:  Diagnosis Date  . Allergy    allergic rhinitis  . Anxiety   . Arthritis    OA knee replacement  . Family history of breast cancer   . Family history of genetic disease carrier    paternal cousin has NBN mutation  . GERD (gastroesophageal reflux disease)   . Hyperlipidemia   . Hypertension   . Hypothyroid   . Reactive airways dysfunction syndrome ([place])   . Skin lesions, generalized    squamous cell skin lesions and basal cell skin lesions    Past Surgical History:  Procedure Laterality Date  . ABDOMINAL HYSTERECTOMY  1990s   hyst with oophrectomy for B9lesion/cyst and prolapse  . BREAST BIOPSY Bilateral    benign  . JOINT REPLACEMENT  05/2009   left total knee replacement  . recocele     and cystocele repair  . tsa      There were no vitals filed for this visit.    Subjective Assessment - 12/23/19 1622    Subjective L thoracic spasm underneath L shoulder blade: 2/10 currently, 9/10 at most for the past 3 months    Pertinent History L thoracic back pain.. Had PT for L shoulder before which helped. Takes breaks when she performs tasks. Activity which involves leaning over bothers her L shoulder. Pain worsened  gradually. Did some canning and freezing during the summer. L scapula was hurting in August. Sitting on folding chairs bothers her L shoulder blade area.  Pain seems to be worsening. Pt is R hand dominant. Also has low back pain and got injections on both sides of her low back Friday 12/19/2019 which is helping the low back.    Patient Stated Goals Not to hurt.    Currently in Pain? Yes    Pain Score 2     Pain Location Scapula    Pain Orientation Left    Pain Descriptors / Indicators --   grabbing; steady pain at times   Pain Type Chronic pain    Pain Onset More than a month ago    Pain Frequency Occasional    Aggravating Factors  Washing dishes, leanding over the sink or cabinet, writing Christmas cards. Leaning over.    Pain Relieving Factors pain medication, rasing her L arm up. doorway pectoralis stretch, chin tucks, heat              [facility] PT Assessment - 12/23/19 1634      Assessment   Medical Diagnosis Chronic L sided thoracic back pain    Referring Provider (PT) Loura Pardon, MD    Onset Date/Surgical  Date 11/12/19   Date PT referral signed   Hand Dominance Right    Prior Therapy Positive results with prior PT for condition.       Precautions   Precaution Comments No known precautions      Restrictions   Other Position/Activity Restrictions No known restrictions      Balance Screen   Has the patient fallen in the past 6 months No    Has the patient had a decrease in activity level because of a fear of falling?  No   just careful   Is the patient reluctant to leave their home because of a fear of falling?  No      Prior Function   Level of Independence Independent    Vocation Retired      Mining engineer Comments L cervical side bnend, B scapular ptraction, cervical protraction, kyphosis, R shoulder lower, L hip in ER, R lumbar convexity      AROM   Cervical Flexion full     Cervical Extension WFL    Cervical - Right Side Bend WFL    Cervical  - Left Side Bend WFL    Cervical - Right Rotation full    Cervical - Left Rotation [facility]      Strength   Overall Strength Comments Scapular retraction: 4/5 L, 4+/5 R    Left Shoulder Internal Rotation 4+/5   increased pain   Left Shoulder External Rotation 4/5   increased pain     Palpation   Palpation comment Muscle tension cervical paraspinal, B upper trap muscles                          PT Education - 12/23/19 1915    Education Details plan of care    Person(s) Educated Patient    Methods Explanation    Comprehension Verbalized understanding          Objective measurements completed on examination: See above findings.     No latex allergies Blood pressure controlled per pt.    Antalgic, decreased stance R LE with R lateral lean  posture when writing a Christmas card: L cervical side bend (L upper trap muscle tension), B scapular protraction, cervical protraction, kyphosis      Pt states hearing her heart beat in R ear. Was checked out, doctors could not figure it out. Heart beat in R ear is sometimes there, sometimes not. Losing hearing in L ear.   L shoulder extension adduction and IR reproduces L scapular symptoms.   L shoulder IR with L hand on abdomen (subscapularis) 4/10 with pain afterwards.   Seated manually resisted L lateral shift isometrics: increased L scapular pain and L lateral thoracic pain   Thoracic flexion, [facility] Thoracic extension: increases symptoms    R scapular retraction 7x: increases symptoms.   Muscle tension cervical paraspinal, B upper trap muscles   Manual therapy Seated STM B cervical paraspinal muscles, UT and L scalene muscles to decrease tension    Response to treatment Decreased L scapular pain after manual therapy   Clinical impression Patient is a 74 year old female who came to physical therapy secondary to L posterior thoracic pain at her scapula. She also presents with poor posture, muscle tension B  cervical paraspinal, upper trap and scalene muscles, L shoulder and B scapular weakness, and difficulty performing tasks which involve leaning forward such as washing dishes and writing Christmas cards. Pt will benefit  from skilled physical therapy services to address the aforementioned deficits.       PT Short Term Goals - 12/23/19 1857      PT SHORT TERM GOAL #1   Title Patient will be independent with her initial HEP to decrease pain, improve strength and function.    Time 3    Period Weeks    Status New    Target Date 01/15/20           PT Long Term Goals - 12/23/19 1858      PT LONG TERM GOAL #1   Title Patient will have a decrease in L thoracic/scapular pain to 3/10 or less at most to promote ability to perform chores, wash dishes, write cards more comfortably.    Baseline 9/10 L scapular pain at most for the past 3 months (12/23/2019)    Time 8    Period Weeks    Status New    Target Date 02/19/20      PT LONG TERM GOAL #2   Title Patient will improve her FOTO score by at least 10 points as a demonstration of improved function.    Baseline Thoracic spine FOTO 46 (12/23/2019)    Time 8    Period Weeks    Status New    Target Date 02/19/20      PT LONG TERM GOAL #3   Title Pt will report decreased pain with washing disches as a demonstration of improved abiity to perform functional tasks at home.    Baseline Increased L thoracic/scapular pain with washing dishes (12/23/2019)    Time 8    Period Weeks    Status New    Target Date 02/19/20              Plan - 12/23/19 1853    Clinical Impression Statement Patient is a 74 year old female who came to physical therapy secondary to L posterior thoracic pain at her scapula. She also presents with poor posture, muscle tension B cervical paraspinal, upper trap and scalene muscles, L shoulder and B scapular weakness, and difficulty performing tasks which involve leaning forward such as washing dishes and writing Christmas  cards. Pt will benefit from skilled physical therapy services to address the aforementioned deficits.    Personal Factors and Comorbidities Age;Comorbidity 3+;Fitness;Time since onset of injury/illness/exacerbation;Past/Current Experience    Comorbidities anxiety, HTN, LBP    Examination-Activity Limitations Hygiene/Grooming;Bend;Caring for Others;Other   chores   Stability/Clinical Decision Making Evolving/Moderate complexity   Worsening pain per pt   Clinical Decision Making Moderate    Rehab Potential Fair    PT Frequency 2x / week    PT Duration 8 weeks    PT Treatment/Interventions Therapeutic activities;Therapeutic exercise;Neuromuscular re-education;Patient/family education;Manual techniques;Dry needling;Spinal Manipulations;Joint Manipulations;Aquatic Therapy;Electrical Stimulation;Iontophoresis 4mg /ml Dexamethasone;Traction    PT Next Visit Plan Manual techniques, posture, scapular strengthening, anterior cervical strengthening, shoulder strengthening, modalities PRN    Consulted and Agree with Plan of Care Patient                     Patient will benefit from skilled therapeutic intervention in order to improve the following deficits and impairments:  Pain, Postural dysfunction, Impaired UE functional use, Improper body mechanics, Decreased strength  Visit Diagnosis: Pain in thoracic spine - Plan: PT plan of care cert/re-cert  Pain in scapula - Plan: PT plan of care cert/re-cert     Problem List Patient Active Problem List   Diagnosis Date Noted  . Left-sided  thoracic back pain 11/12/2019  . Routine general medical examination at a health care facility 07/29/2019  . Genetic testing 01/02/2018  . Family history of breast cancer   . Family history of genetic disease carrier   . Meningioma ([place]) 07/17/2017  . Fall 01/04/2017  . Vasomotor symptoms due to menopause 06/26/2016  . Atrophic vaginitis 09/01/2015  . Pedal edema 05/27/2014  . Estrogen deficiency  02/21/2013  . Encounter for Medicare annual wellness exam 02/21/2013  . Low back pain potentially associated with radiculopathy 10/11/2012  . ALLERGIC RHINITIS 01/28/2010  . Prediabetes 07/30/2009  . ANXIETY 11/30/2008  . Hyperlipidemia 04/13/2008  . Hypothyroidism 01/07/2008  . Obesity 01/07/2008  . HYPERTENSION, BENIGN 01/07/2008  . OSTEOARTHRITIS, GENERALIZED 01/07/2008    Joneen Boers PT, DPT   12/23/2019, 7:17 PM  [facility] [address] [place], [address] Phone: [phone]   Fax:  [phone]  Name: Amber Miles MRN: [id number] Date of Birth: 05/04/1945

## 2019-12-25 ENCOUNTER — Other Ambulatory Visit: Payer: Self-pay

## 2019-12-25 ENCOUNTER — Ambulatory Visit: Payer: PPO

## 2019-12-25 DIAGNOSIS — M546 Pain in thoracic spine: Secondary | ICD-10-CM | POA: Diagnosis not present

## 2019-12-25 DIAGNOSIS — M898X1 Other specified disorders of bone, shoulder: Secondary | ICD-10-CM

## 2019-12-25 NOTE — Therapy (Unsigned)
[facility] [address], [place], [address] Phone: [phone]   Fax:  [phone]  Physical Therapy Treatment  Patient Details  Name: Amber Miles MRN: [id number] Date of Birth: 01-29-1945 Referring Provider (PT): Loura Pardon, MD   Encounter Date: 12/25/2019   PT End of Session - 12/25/19 1658    Visit Number 2    Number of Visits 17    Date for PT Re-Evaluation 02/19/20    Authorization Type 2    Authorization Time Period of 10 progress report    PT Start Time 1658    PT Stop Time 1743    PT Time Calculation (min) 45 min    Activity Tolerance Patient tolerated treatment well    Behavior During Therapy [facility] for tasks assessed/performed           Past Medical History:  Diagnosis Date  . Allergy    allergic rhinitis  . Anxiety   . Arthritis    OA knee replacement  . Family history of breast cancer   . Family history of genetic disease carrier    paternal cousin has NBN mutation  . GERD (gastroesophageal reflux disease)   . Hyperlipidemia   . Hypertension   . Hypothyroid   . Reactive airways dysfunction syndrome ([place])   . Skin lesions, generalized    squamous cell skin lesions and basal cell skin lesions    Past Surgical History:  Procedure Laterality Date  . ABDOMINAL HYSTERECTOMY  1990s   hyst with oophrectomy for B9lesion/cyst and prolapse  . BREAST BIOPSY Bilateral    benign  . JOINT REPLACEMENT  05/2009   left total knee replacement  . recocele     and cystocele repair  . tsa      There were no vitals filed for this visit.   Subjective Assessment - 12/25/19 1701    Subjective Patient reported after the session she slept better, thinks that the STM really helped.    Pertinent History L thoracic back pain.. Had PT for L shoulder before which helped. Takes breaks when she performs tasks. Activity which involves leaning over bothers her L shoulder. Pain worsened gradually. Did  some canning and freezing during the summer. L scapula was hurting in August. Sitting on folding chairs bothers her L shoulder blade area.  Pain seems to be worsening. Pt is R hand dominant. Also has low back pain and got injections on both sides of her low back Friday 12/19/2019 which is helping the low back.    Currently in Pain? Yes    Pain Score 1     Pain Location Scapula    Pain Orientation Left    Pain Descriptors / Indicators --   steady, grabbing pain   Pain Type Chronic pain    Pain Onset More than a month ago           Objective measurements completed on examination: See above findings.     No latex allergies Blood pressure controlled per pt.    Antalgic, decreased stance R LE with R lateral lean  posture when writing a Christmas card: L cervical side bend (L upper trap muscle tension), B scapular protraction, cervical protraction, kyphosis    Therapeutic exercise:  Seated rows, active ROM 2x10x3sec  Seated isometric scapular retraction 3x10x3sec bilaterally  Seated chin tucks 3x10x3sec holds  Manual therapy Seated STM B cervical paraspinal muscles, UT and L scalene muscles to decrease tension  pt response/clinical impression: The patient reported no increase in pain at end of session. After multimodal cueing she exhibited excellent chin tuck technique, cued for prolonged hold to maximize muscle endurance. The patient would benefit from further skilled PT to progress as able and maximize function.      PT Education - 12/25/19 1704    Education Details therapeutic exercise technique/form    Person(s) Educated Patient    Methods Explanation    Comprehension Verbalized understanding;Returned demonstration;Verbal cues required            PT Short Term Goals - 12/23/19 1857      PT SHORT TERM GOAL #1   Title Patient will be independent with her initial HEP to decrease pain, improve strength and function.    Time 3    Period Weeks    Status New     Target Date 01/15/20             PT Long Term Goals - 12/23/19 1858      PT LONG TERM GOAL #1   Title Patient will have a decrease in L thoracic/scapular pain to 3/10 or less at most to promote ability to perform chores, wash dishes, write cards more comfortably.    Baseline 9/10 L scapular pain at most for the past 3 months (12/23/2019)    Time 8    Period Weeks    Status New    Target Date 02/19/20      PT LONG TERM GOAL #2   Title Patient will improve her FOTO score by at least 10 points as a demonstration of improved function.    Baseline Thoracic spine FOTO 46 (12/23/2019)    Time 8    Period Weeks    Status New    Target Date 02/19/20      PT LONG TERM GOAL #3   Title Pt will report decreased pain with washing disches as a demonstration of improved abiity to perform functional tasks at home.    Baseline Increased L thoracic/scapular pain with washing dishes (12/23/2019)    Time 8    Period Weeks    Status New    Target Date 02/19/20                 Plan - 12/25/19 1657    Clinical Impression Statement The patient reported no increase in pain at end of session. After multimodal cueing she exhibited excellent chin tuck technique, cued for prolonged hold to maximize muscle endurance. The patient would benefit from further skilled PT to progress as able and maximize function.    Personal Factors and Comorbidities Age;Comorbidity 3+;Fitness;Time since onset of injury/illness/exacerbation;Past/Current Experience    Comorbidities anxiety, HTN, LBP    Examination-Activity Limitations Hygiene/Grooming;Bend;Caring for Others;Other   chores   Stability/Clinical Decision Making Evolving/Moderate complexity   worsening pain per pt report   Rehab Potential Fair    PT Frequency 2x / week    PT Duration 8 weeks    PT Treatment/Interventions Therapeutic activities;Therapeutic exercise;Neuromuscular re-education;Patient/family education;Manual techniques;Dry needling;Spinal  Manipulations;Joint Manipulations;Aquatic Therapy;Electrical Stimulation;Iontophoresis 4mg /ml Dexamethasone;Traction    PT Next Visit Plan Manual techniques, posture, scapular strengthening, anterior cervical strengthening, shoulder strengthening, modalities PRN    Consulted and Agree with Plan of Care Patient           Patient will benefit from skilled therapeutic intervention in order to improve the following deficits and impairments:  Pain,Postural dysfunction,Impaired UE functional use,Improper body mechanics,Decreased strength  Visit Diagnosis: Pain in thoracic spine  Pain in scapula     Problem List Patient Active Problem List   Diagnosis Date Noted  . Left-sided thoracic back pain 11/12/2019  . Routine general medical examination at a health care facility 07/29/2019  . Genetic testing 01/02/2018  . Family history of breast cancer   . Family history of genetic disease carrier   . Meningioma ([place]) 07/17/2017  . Fall 01/04/2017  . Vasomotor symptoms due to menopause 06/26/2016  . Atrophic vaginitis 09/01/2015  . Pedal edema 05/27/2014  . Estrogen deficiency 02/21/2013  . Encounter for Medicare annual wellness exam 02/21/2013  . Low back pain potentially associated with radiculopathy 10/11/2012  . ALLERGIC RHINITIS 01/28/2010  . Prediabetes 07/30/2009  . ANXIETY 11/30/2008  . Hyperlipidemia 04/13/2008  . Hypothyroidism 01/07/2008  . Obesity 01/07/2008  . HYPERTENSION, BENIGN 01/07/2008  . OSTEOARTHRITIS, GENERALIZED 01/07/2008    Lieutenant Diego PT, DPT 12:29 PM,12/26/19   [facility] [address] [place], [address] Phone: [phone]   Fax:  [phone]  Name: LATESIA NORRINGTON MRN: [id number] Date of Birth: February 24, 1945

## 2019-12-29 DIAGNOSIS — L821 Other seborrheic keratosis: Secondary | ICD-10-CM | POA: Diagnosis not present

## 2019-12-29 DIAGNOSIS — L918 Other hypertrophic disorders of the skin: Secondary | ICD-10-CM | POA: Diagnosis not present

## 2019-12-29 DIAGNOSIS — L820 Inflamed seborrheic keratosis: Secondary | ICD-10-CM | POA: Diagnosis not present

## 2019-12-29 DIAGNOSIS — D2272 Melanocytic nevi of left lower limb, including hip: Secondary | ICD-10-CM | POA: Diagnosis not present

## 2019-12-29 DIAGNOSIS — D2271 Melanocytic nevi of right lower limb, including hip: Secondary | ICD-10-CM | POA: Diagnosis not present

## 2019-12-29 DIAGNOSIS — Z85828 Personal history of other malignant neoplasm of skin: Secondary | ICD-10-CM | POA: Diagnosis not present

## 2019-12-29 DIAGNOSIS — L603 Nail dystrophy: Secondary | ICD-10-CM | POA: Diagnosis not present

## 2019-12-29 DIAGNOSIS — D225 Melanocytic nevi of trunk: Secondary | ICD-10-CM | POA: Diagnosis not present

## 2019-12-29 DIAGNOSIS — D2262 Melanocytic nevi of left upper limb, including shoulder: Secondary | ICD-10-CM | POA: Diagnosis not present

## 2019-12-30 ENCOUNTER — Ambulatory Visit: Payer: PPO

## 2019-12-30 ENCOUNTER — Other Ambulatory Visit: Payer: Self-pay

## 2019-12-30 DIAGNOSIS — M546 Pain in thoracic spine: Secondary | ICD-10-CM | POA: Diagnosis not present

## 2019-12-30 DIAGNOSIS — M898X1 Other specified disorders of bone, shoulder: Secondary | ICD-10-CM

## 2019-12-30 NOTE — Therapy (Signed)
[facility] [address], [place], [address] Phone: [phone]   Fax:  [phone]  Physical Therapy Treatment  Patient Details  Name: Amber Miles MRN: [id number] Date of Birth: 1945/10/12 Referring Provider (PT): Loura Pardon, MD   Encounter Date: 12/30/2019   PT End of Session - 12/30/19 1430    Visit Number 3    Number of Visits 17    Date for PT Re-Evaluation 02/19/20    Authorization Type 3    Authorization Time Period of 10 progress report    PT Start Time 1430    PT Stop Time 1513    PT Time Calculation (min) 43 min    Activity Tolerance Patient tolerated treatment well    Behavior During Therapy [facility] for tasks assessed/performed           Past Medical History:  Diagnosis Date  . Allergy    allergic rhinitis  . Anxiety   . Arthritis    OA knee replacement  . Family history of breast cancer   . Family history of genetic disease carrier    paternal cousin has NBN mutation  . GERD (gastroesophageal reflux disease)   . Hyperlipidemia   . Hypertension   . Hypothyroid   . Reactive airways dysfunction syndrome ([place])   . Skin lesions, generalized    squamous cell skin lesions and basal cell skin lesions    Past Surgical History:  Procedure Laterality Date  . ABDOMINAL HYSTERECTOMY  1990s   hyst with oophrectomy for B9lesion/cyst and prolapse  . BREAST BIOPSY Bilateral    benign  . JOINT REPLACEMENT  05/2009   left total knee replacement  . recocele     and cystocele repair  . tsa      There were no vitals filed for this visit.   Subjective Assessment - 12/30/19 1431    Subjective L shoulder blade is better. Has not had a really bad spasm since physical therapy. No pain currently.    Pertinent History L thoracic back pain.. Had PT for L shoulder before which helped. Takes breaks when she performs tasks. Activity which involves leaning over bothers her L shoulder. Pain worsened  gradually. Did some canning and freezing during the summer. L scapula was hurting in August. Sitting on folding chairs bothers her L shoulder blade area.  Pain seems to be worsening. Pt is R hand dominant. Also has low back pain and got injections on both sides of her low back Friday 12/19/2019 which is helping the low back.    Currently in Pain? No/denies    Pain Onset More than a month ago                                     PT Education - 12/30/19 1434    Education Details ther-ex    [employer]) Educated Patient    Methods Explanation;Demonstration;Tactile cues;Verbal cues    Comprehension Returned demonstration;Verbalized understanding          Objective   No latex allergies Blood pressure controlled per pt.   Antalgic, decreased stance R LE with R lateral lean  posture when writing a Christmas card: L cervical side bend (L upper trap muscle tension), B scapular protraction, cervical protraction, kyphosis   Therapeutic exercise:  Supine cervical nod 10x3 slowly   Supine cervical rotation 10x3 with PT assist, cues for no  compensation   Supine chin tucks 10x5 seconds for 3 sets  Supine deep cervical flexion 5x2  R ear pulling which eases with rest.   Seated isometric scapular retraction 3x10  bilaterally   Improved exercise technique, movement at target joints, use of target muscles after mod verbal, visual, tactile cues.     Manual therapy Supine STM B cervical paraspinal muscles,  Seated STM B UT and L scalene muscles to decrease tension    Response to treatment Pt tolerated session well without aggravation of symptoms.    Clinical impression Worked on anterior cervical strengthening to help decrease cervical paraspinal muscle tension. Good carry over of decreased pain from previous sessions. Continued with manual therapy to decrease muscle tension to cervical paraspinal and upper trap muscles to help decrease L scapular  symptoms. Pt tolerated session well without aggravation of symptoms. Pt will benefit from continued skilled physical therapy services to decrease pain, improve strength and function.      PT Short Term Goals - 12/23/19 1857      PT SHORT TERM GOAL #1   Title Patient will be independent with her initial HEP to decrease pain, improve strength and function.    Time 3    Period Weeks    Status New    Target Date 01/15/20             PT Long Term Goals - 12/23/19 1858      PT LONG TERM GOAL #1   Title Patient will have a decrease in L thoracic/scapular pain to 3/10 or less at most to promote ability to perform chores, wash dishes, write cards more comfortably.    Baseline 9/10 L scapular pain at most for the past 3 months (12/23/2019)    Time 8    Period Weeks    Status New    Target Date 02/19/20      PT LONG TERM GOAL #2   Title Patient will improve her FOTO score by at least 10 points as a demonstration of improved function.    Baseline Thoracic spine FOTO 46 (12/23/2019)    Time 8    Period Weeks    Status New    Target Date 02/19/20      PT LONG TERM GOAL #3   Title Pt will report decreased pain with washing disches as a demonstration of improved abiity to perform functional tasks at home.    Baseline Increased L thoracic/scapular pain with washing dishes (12/23/2019)    Time 8    Period Weeks    Status New    Target Date 02/19/20                 Plan - 12/30/19 1429    Clinical Impression Statement Worked on anterior cervical strengthening to help decrease cervical paraspinal muscle tension. Good carry over of decreased pain from previous sessions. Continued with manual therapy to decrease muscle tension to cervical paraspinal and upper trap muscles to help decrease L scapular symptoms. Pt tolerated session well without aggravation of symptoms. Pt will benefit from continued skilled physical therapy services to decrease pain, improve strength and function.     Personal Factors and Comorbidities Age;Comorbidity 3+;Fitness;Time since onset of injury/illness/exacerbation;Past/Current Experience    Comorbidities anxiety, HTN, LBP    Examination-Activity Limitations Hygiene/Grooming;Bend;Caring for Others;Other   chores   Stability/Clinical Decision Making Stable/Uncomplicated   worsening pain per pt report   Clinical Decision Making Low    Rehab Potential Fair    PT  Frequency 2x / week    PT Duration 8 weeks    PT Treatment/Interventions Therapeutic activities;Therapeutic exercise;Neuromuscular re-education;Patient/family education;Manual techniques;Dry needling;Spinal Manipulations;Joint Manipulations;Aquatic Therapy;Electrical Stimulation;Iontophoresis 4mg /ml Dexamethasone;Traction    PT Next Visit Plan Manual techniques, posture, scapular strengthening, anterior cervical strengthening, shoulder strengthening, modalities PRN    Consulted and Agree with Plan of Care Patient           Patient will benefit from skilled therapeutic intervention in order to improve the following deficits and impairments:  Pain,Postural dysfunction,Impaired UE functional use,Improper body mechanics,Decreased strength  Visit Diagnosis: Pain in thoracic spine  Pain in scapula     Problem List Patient Active Problem List   Diagnosis Date Noted  . Left-sided thoracic back pain 11/12/2019  . Routine general medical examination at a health care facility 07/29/2019  . Genetic testing 01/02/2018  . Family history of breast cancer   . Family history of genetic disease carrier   . Meningioma ([place]) 07/17/2017  . Fall 01/04/2017  . Vasomotor symptoms due to menopause 06/26/2016  . Atrophic vaginitis 09/01/2015  . Pedal edema 05/27/2014  . Estrogen deficiency 02/21/2013  . Encounter for Medicare annual wellness exam 02/21/2013  . Low back pain potentially associated with radiculopathy 10/11/2012  . ALLERGIC RHINITIS 01/28/2010  . Prediabetes 07/30/2009  . ANXIETY  11/30/2008  . Hyperlipidemia 04/13/2008  . Hypothyroidism 01/07/2008  . Obesity 01/07/2008  . HYPERTENSION, BENIGN 01/07/2008  . OSTEOARTHRITIS, GENERALIZED 01/07/2008    Joneen Boers PT, DPT   12/30/2019, 7:24 PM  [facility] [address] [place], [address] Phone: [phone]   Fax:  [phone]  Name: Amber Miles MRN: [id number] Date of Birth: [birth year]

## 2020-01-06 ENCOUNTER — Other Ambulatory Visit: Payer: Self-pay

## 2020-01-06 ENCOUNTER — Ambulatory Visit: Payer: PPO

## 2020-01-06 DIAGNOSIS — M546 Pain in thoracic spine: Secondary | ICD-10-CM

## 2020-01-06 DIAGNOSIS — M898X1 Other specified disorders of bone, shoulder: Secondary | ICD-10-CM

## 2020-01-06 NOTE — Therapy (Signed)
[facility] [address] [place], [place], [address] Phone: [phone]   Fax:  [phone]  Physical Therapy Treatment  Patient Details  Name: Amber Miles MRN: [id number] Date of Birth: 1945/11/12 Referring Provider (PT): Loura Pardon, MD   Encounter Date: 01/06/2020   PT End of Session - 01/06/20 1428    Visit Number 4    Number of Visits 17    Date for PT Re-Evaluation 02/19/20    Authorization Type 4    Authorization Time Period of 10 progress report    PT Start Time 0109    PT Stop Time 3235    PT Time Calculation (min) 46 min    Activity Tolerance Patient tolerated treatment well    Behavior During Therapy WFL for tasks assessed/performed           Past Medical History:  Diagnosis Date   Allergy    allergic rhinitis   Anxiety    Arthritis    OA knee replacement   Family history of breast cancer    Family history of genetic disease carrier    paternal cousin has NBN mutation   GERD (gastroesophageal reflux disease)    Hyperlipidemia    Hypertension    Hypothyroid    Reactive airways dysfunction syndrome ([facility])    Skin lesions, generalized    squamous cell skin lesions and basal cell skin lesions    Past Surgical History:  Procedure Laterality Date   ABDOMINAL HYSTERECTOMY  1990s   hyst with oophrectomy for B9lesion/cyst and prolapse   BREAST BIOPSY Bilateral    benign   JOINT REPLACEMENT  05/2009   left total knee replacement   recocele     and cystocele repair   tsa      There were no vitals filed for this visit.   Subjective Assessment - 01/06/20 1429    Subjective L shouldre blade area is doing better, has not had the spasms (spasms drives her nuts). It did hurt when she was in the kitchen chopping up vegetables. Has loud heart beat R ear currently.    Pertinent History L thoracic back pain.. Had PT for L shoulder before which helped. Takes breaks when she  performs tasks. Activity which involves leaning over bothers her L shoulder. Pain worsened gradually. Did some canning and freezing during the summer. L scapula was hurting in August. Sitting on folding chairs bothers her L shoulder blade area.  Pain seems to be worsening. Pt is R hand dominant. Also has low back pain and got injections on both sides of her low back Friday 12/19/2019 which is helping the low back.    Currently in Pain? No/denies    Pain Onset More than a month ago                                     PT Education - 01/06/20 1445    Education Details ther-ex, HEP    Person(s) Educated Patient    Methods Explanation;Demonstration;Tactile cues;Verbal cues;Handout    Comprehension Returned demonstration;Verbalized understanding          Objective   No latex allergies Blood pressure controlled per pt.   Antalgic, decreased stance R LE with R lateral lean  posture when writing a Christmas card: L cervical side bend (L upper trap muscle tension), B scapular protraction, cervical protraction, kyphosis   Med[employer] Access  Code [email or web address]    Therapeutic exercise:  R cervical rotation: decreased volume R ear heart beat.   Seated manually resisted L cervical lateral shift isomettrics to counter R lateral shift posture 10x3 with 5 second holds   Seated cervical nod isometrics, thumbs under chin, 10x5 seconds for 3 sets  Seated chin tucks 10x3 with 5 second holds   Seated thoracic extension over chair 10x3 with 5 second holds   Caudal pressure to R first rib area  L cervical side bend 10x10 seconds for 2 sets  Eccentric cervical rotation with PT manual resistance  R 10x2    Improved exercise technique, movement at target joints, use of target muscles after mod verbal, visual, tactile cues.     Manual therapy  Seated STM R upper trap, and R scalene muscle area to decrease tension    Response to treatment Pt tolerated  session well without aggravation of symptoms    Clinical impression  Continued working on anterior cervical muscle strengthening as well as thoracic extension to help decrease posterior cervical and upper trap muscle tension. Pt making good progress overall with decreased L scapular symptoms based on subjective reports. Pt tolerated session well without aggravation of symptoms. Pt will benefit from continued skilled physical therapy services to decrease pain, improve strength and function.      PT Short Term Goals - 12/23/19 1857      PT SHORT TERM GOAL #1   Title Patient will be independent with her initial HEP to decrease pain, improve strength and function.    Time 3    Period Weeks    Status New    Target Date 01/15/20             PT Long Term Goals - 12/23/19 1858      PT LONG TERM GOAL #1   Title Patient will have a decrease in L thoracic/scapular pain to 3/10 or less at most to promote ability to perform chores, wash dishes, write cards more comfortably.    Baseline 9/10 L scapular pain at most for the past 3 months (12/23/2019)    Time 8    Period Weeks    Status New    Target Date 02/19/20      PT LONG TERM GOAL #2   Title Patient will improve her FOTO score by at least 10 points as a demonstration of improved function.    Baseline Thoracic spine FOTO 46 (12/23/2019)    Time 8    Period Weeks    Status New    Target Date 02/19/20      PT LONG TERM GOAL #3   Title Pt will report decreased pain with washing disches as a demonstration of improved abiity to perform functional tasks at home.    Baseline Increased L thoracic/scapular pain with washing dishes (12/23/2019)    Time 8    Period Weeks    Status New    Target Date 02/19/20                 Plan - 01/06/20 1426    Clinical Impression Statement Continued working on anterior cervical muscle strengthening as well as thoracic extension to help decrease posterior cervical and upper trap muscle tension.  Pt making good progress overall with decreased L scapular symptoms based on subjective reports. Pt tolerated session well without aggravation of symptoms. Pt will benefit from continued skilled physical therapy services to decrease pain, improve strength and function.    Personal Factors and  Comorbidities Age;Comorbidity 3+;Fitness;Time since onset of injury/illness/exacerbation;Past/Current Experience    Comorbidities anxiety, HTN, LBP    Examination-Activity Limitations Hygiene/Grooming;Bend;Caring for Others;Other   chores   Stability/Clinical Decision Making Stable/Uncomplicated   worsening pain per pt report   Rehab Potential Fair    PT Frequency 2x / week    PT Duration 8 weeks    PT Treatment/Interventions Therapeutic activities;Therapeutic exercise;Neuromuscular re-education;Patient/family education;Manual techniques;Dry needling;Spinal Manipulations;Joint Manipulations;Aquatic Therapy;Electrical Stimulation;Iontophoresis 4mg /ml Dexamethasone;Traction    PT Next Visit Plan Manual techniques, posture, scapular strengthening, anterior cervical strengthening, shoulder strengthening, modalities PRN    Consulted and Agree with Plan of Care Patient           Patient will benefit from skilled therapeutic intervention in order to improve the following deficits and impairments:  Pain,Postural dysfunction,Impaired UE functional use,Improper body mechanics,Decreased strength  Visit Diagnosis: Pain in thoracic spine  Pain in scapula     Problem List Patient Active Problem List   Diagnosis Date Noted   Left-sided thoracic back pain 11/12/2019   Routine general medical examination at a health care facility 07/29/2019   Genetic testing 01/02/2018   Family history of breast cancer    Family history of genetic disease carrier    Meningioma ([place]) 07/17/2017   Fall 01/04/2017   Vasomotor symptoms due to menopause 06/26/2016   Atrophic vaginitis 09/01/2015   Pedal edema 05/27/2014    Estrogen deficiency 02/21/2013   Encounter for Medicare annual wellness exam 02/21/2013   Low back pain potentially associated with radiculopathy 10/11/2012   ALLERGIC RHINITIS 01/28/2010   Prediabetes 07/30/2009   ANXIETY 11/30/2008   Hyperlipidemia 04/13/2008   Hypothyroidism 01/07/2008   Obesity 01/07/2008   HYPERTENSION, BENIGN 01/07/2008   OSTEOARTHRITIS, GENERALIZED 01/07/2008    Joneen Boers PT, DPT   01/06/2020, 6:57 PM  [facility] [address], [place], [address] Phone: [phone]   Fax:  [phone]  Name: Amber Miles MRN: [id number] Date of Birth: 04-02-1945

## 2020-01-06 NOTE — Patient Instructions (Signed)
Access Code: [email or web address] URL: [email or web address] Date: 01/06/2020 Prepared by: Joneen Boers  Exercises Seated Cervical Retraction - 1 x daily - 7 x weekly - 3 sets - 10 reps - 5 seconds hold

## 2020-01-08 ENCOUNTER — Other Ambulatory Visit: Payer: Self-pay

## 2020-01-08 ENCOUNTER — Ambulatory Visit: Payer: PPO

## 2020-01-08 DIAGNOSIS — M546 Pain in thoracic spine: Secondary | ICD-10-CM | POA: Diagnosis not present

## 2020-01-08 DIAGNOSIS — M898X1 Other specified disorders of bone, shoulder: Secondary | ICD-10-CM

## 2020-01-08 NOTE — Therapy (Signed)
[facility] [address] [place], [address] Phone: [phone]   Fax:  [phone]  Physical Therapy Treatment  Patient Details  Name: Amber Miles MRN: [id number] Date of Birth: 01-25-1945 Referring Provider (PT): Loura Pardon, MD   Encounter Date: 01/08/2020   PT End of Session - 01/08/20 1353    Visit Number 5    Number of Visits 17    Date for PT Re-Evaluation 02/19/20    Authorization Type 5    Authorization Time Period of 10 progress report    PT Start Time 1346    PT Stop Time 1426    PT Time Calculation (min) 40 min    Activity Tolerance Patient tolerated treatment well;No increased pain    Behavior During Therapy WFL for tasks assessed/performed           Past Medical History:  Diagnosis Date  . Allergy    allergic rhinitis  . Anxiety   . Arthritis    OA knee replacement  . Family history of breast cancer   . Family history of genetic disease carrier    paternal cousin has NBN mutation  . GERD (gastroesophageal reflux disease)   . Hyperlipidemia   . Hypertension   . Hypothyroid   . Reactive airways dysfunction syndrome ([place])   . Skin lesions, generalized    squamous cell skin lesions and basal cell skin lesions    Past Surgical History:  Procedure Laterality Date  . ABDOMINAL HYSTERECTOMY  1990s   hyst with oophrectomy for B9lesion/cyst and prolapse  . BREAST BIOPSY Bilateral    benign  . JOINT REPLACEMENT  05/2009   left total knee replacement  . recocele     and cystocele repair  . tsa      There were no vitals filed for this visit.   Subjective Assessment - 01/08/20 1350    Subjective Continues to have some soreness in bilat paraspinals. Driving in traffic making her neck more stiff. No other updates since previous session.    Pertinent History L thoracic back pain.. Had PT for L shoulder before which helped. Takes breaks when she performs tasks. Activity which  involves leaning over bothers her L shoulder. Pain worsened gradually. Did some canning and freezing during the summer. L scapula was hurting in August. Sitting on folding chairs bothers her L shoulder blade area.  Pain seems to be worsening. Pt is R hand dominant. Also has low back pain and got injections on both sides of her low back Friday 12/19/2019 which is helping the low back.    Currently in Pain? Yes    Pain Score 2     Pain Location --   bilat paraspinals, bilat upper traps         INTERVENTION THIS DATE:  *moist heat applied to posterior neck during most of activity -cervical rotation A/ROM 1x15x3secH bilat, cues for low chin -seated cervical flexion with overpressure via hand 3x30secH  -end range rotation cervical nod 2x30sec bilat (has Right suboccipital tightness with tissue lengthening) -isometric cervical flexion (soft foam ball between chin/sternum) 15x5secH (blue yellow ball)  -seated cervical retraction 15x5secH  -T-spine extension over chairback (seated on airex) PVC in hands at sternum for scapular retraction pairing 20x3secH (cervical position ad lib) stopped early due to aggravation of lumbar pain  -supine, legs elevated, longitudinal throacic towel roll x 35minutes; then 20x wand flexion 1secH (no back pain issue)     PT Short  Term Goals - 12/23/19 1857      PT SHORT TERM GOAL #1   Title Patient will be independent with her initial HEP to decrease pain, improve strength and function.    Time 3    Period Weeks    Status New    Target Date 01/15/20             PT Long Term Goals - 12/23/19 1858      PT LONG TERM GOAL #1   Title Patient will have a decrease in L thoracic/scapular pain to 3/10 or less at most to promote ability to perform chores, wash dishes, write cards more comfortably.    Baseline 9/10 L scapular pain at most for the past 3 months (12/23/2019)    Time 8    Period Weeks    Status New    Target Date 02/19/20      PT LONG TERM GOAL #2    Title Patient will improve her FOTO score by at least 10 points as a demonstration of improved function.    Baseline Thoracic spine FOTO 46 (12/23/2019)    Time 8    Period Weeks    Status New    Target Date 02/19/20      PT LONG TERM GOAL #3   Title Pt will report decreased pain with washing disches as a demonstration of improved abiity to perform functional tasks at home.    Baseline Increased L thoracic/scapular pain with washing dishes (12/23/2019)    Time 8    Period Weeks    Status New    Target Date 02/19/20                 Plan - 01/08/20 1354    Clinical Impression Statement Continued with current plan of care as laid out in evaluation and recent prior sessions. Upper cervical spine hypomobility continues to dominate motor patterns and postural defaults. Pt remains motivated to advance progress toward goals. Rest breaks provided as needed, pt quick to ask when needed. Author maintains all interventions within appropriate level of intensity as not to purposefully exacerbate pain. Pt does require varying levels of assistance and cuing for completion of exercises for correct form and sometimes due to pain/weakness. Pt continues to demonstrate progress toward goals AEB progression of some interventions this date either in volume or intensity. No updates to HEP this date.     Personal Factors and Comorbidities Age;Comorbidity 3+;Fitness;Time since onset of injury/illness/exacerbation;Past/Current Experience    Comorbidities anxiety, HTN, LBP    Examination-Activity Limitations Hygiene/Grooming;Bend;Caring for Others;Other    Stability/Clinical Decision Making Stable/Uncomplicated    Clinical Decision Making Low    Rehab Potential Fair    PT Frequency 2x / week    PT Duration 8 weeks    PT Treatment/Interventions Therapeutic activities;Therapeutic exercise;Neuromuscular re-education;Patient/family education;Manual techniques;Dry needling;Spinal Manipulations;Joint  Manipulations;Aquatic Therapy;Electrical Stimulation;Iontophoresis 4mg /ml Dexamethasone;Traction    PT Next Visit Plan Manual techniques, posture, scapular strengthening, anterior cervical strengthening, shoulder strengthening, modalities PRN    Consulted and Agree with Plan of Care Patient           Patient will benefit from skilled therapeutic intervention in order to improve the following deficits and impairments:  Pain,Postural dysfunction,Impaired UE functional use,Improper body mechanics,Decreased strength  Visit Diagnosis: Pain in thoracic spine  Pain in scapula     Problem List Patient Active Problem List   Diagnosis Date Noted  . Left-sided thoracic back pain 11/12/2019  . Routine general medical examination at a  health care facility 07/29/2019  . Genetic testing 01/02/2018  . Family history of breast cancer   . Family history of genetic disease carrier   . Meningioma ([place]) 07/17/2017  . Fall 01/04/2017  . Vasomotor symptoms due to menopause 06/26/2016  . Atrophic vaginitis 09/01/2015  . Pedal edema 05/27/2014  . Estrogen deficiency 02/21/2013  . Encounter for Medicare annual wellness exam 02/21/2013  . Low back pain potentially associated with radiculopathy 10/11/2012  . ALLERGIC RHINITIS 01/28/2010  . Prediabetes 07/30/2009  . ANXIETY 11/30/2008  . Hyperlipidemia 04/13/2008  . Hypothyroidism 01/07/2008  . Obesity 01/07/2008  . HYPERTENSION, BENIGN 01/07/2008  . OSTEOARTHRITIS, GENERALIZED 01/07/2008   2:27 PM, 01/08/20 Etta Grandchild, PT, DPT Physical Therapist - [place] [phone] (Office)    Dannette Kinkaid C 01/08/2020, 1:56 PM   [facility] [address], [place], [address] Phone: [phone]   Fax:  [phone]  Name: Amber Miles MRN: [id number] Date of Birth: 09-Jul-1945

## 2020-01-13 DIAGNOSIS — M47817 Spondylosis without myelopathy or radiculopathy, lumbosacral region: Secondary | ICD-10-CM | POA: Diagnosis not present

## 2020-01-15 ENCOUNTER — Ambulatory Visit: Payer: PPO

## 2020-01-15 ENCOUNTER — Other Ambulatory Visit: Payer: Self-pay

## 2020-01-15 DIAGNOSIS — M898X1 Other specified disorders of bone, shoulder: Secondary | ICD-10-CM

## 2020-01-15 DIAGNOSIS — M546 Pain in thoracic spine: Secondary | ICD-10-CM | POA: Diagnosis not present

## 2020-01-15 NOTE — Therapy (Signed)
[place] [facility] [address], [place], [address] Phone: [phone]   Fax:  [phone]  Physical Therapy Treatment  Patient Details  Name: Amber Miles MRN: [id number] Date of Birth: 1945/02/08 Referring Provider (PT): Roxy Manns, MD   Encounter Date: 01/15/2020   PT End of Session - 01/15/20 1436    Visit Number 6    Number of Visits 17    Date for PT Re-Evaluation 02/19/20    Authorization Type 6    Authorization Time Period of 10 progress report    PT Start Time 1436    PT Stop Time 1516    PT Time Calculation (min) 40 min    Activity Tolerance Patient tolerated treatment well;No increased pain    Behavior During Therapy WFL for tasks assessed/performed           Past Medical History:  Diagnosis Date   Allergy    allergic rhinitis   Anxiety    Arthritis    OA knee replacement   Family history of breast cancer    Family history of genetic disease carrier    paternal cousin has NBN mutation   GERD (gastroesophageal reflux disease)    Hyperlipidemia    Hypertension    Hypothyroid    Reactive airways dysfunction syndrome (HCC)    Skin lesions, generalized    squamous cell skin lesions and basal cell skin lesions    Past Surgical History:  Procedure Laterality Date   ABDOMINAL HYSTERECTOMY  1990s   hyst with oophrectomy for B9lesion/cyst and prolapse   BREAST BIOPSY Bilateral    benign   JOINT REPLACEMENT  05/2009   left total knee replacement   recocele     and cystocele repair   tsa      There were no vitals filed for this visit.   Subjective Assessment - 01/15/20 1437    Subjective L shoulder blade is good today. Leaning in the sink bothered her L shoulder blade but it did not spasm. Used a heating pad afterwards which helped without taking pain medicine. 8/10 L shoulder blade pain yesterday washing turnip greens (7/10 during the Christmas weekend, just used the  heating pad, and did not have to take pain medicine).    Pertinent History L thoracic back pain.. Had PT for L shoulder before which helped. Takes breaks when she performs tasks. Activity which involves leaning over bothers her L shoulder. Pain worsened gradually. Did some canning and freezing during the summer. L scapula was hurting in August. Sitting on folding chairs bothers her L shoulder blade area.  Pain seems to be worsening. Pt is R hand dominant. Also has low back pain and got injections on both sides of her low back Friday 12/19/2019 which is helping the low back.    Currently in Pain? No/denies                                     PT Education - 01/15/20 1450    Education Details ther-ex    Person(s) Educated Patient    Methods Explanation;Demonstration;Tactile cues;Verbal cues    Comprehension Verbalized understanding;Returned demonstration          Objective   No latex allergies Blood pressure controlled per pt.   Antalgic, decreased stance R LE with R lateral lean  posture when writing a Christmas card: L cervical side bend (L  upper trap muscle tension), B scapular protraction, cervical protraction, kyphosis   Medbridge Access Code [email or web address]    Therapeutic exercise:  Medium R ear volume currently.   R first rib stretch 15 seconds x 5 for 2 sets  Reviewed and given as part of her HEP. Pt demonstrated and verbalized undrstanding. Handout provided.   Seated cervical nod isometrics, thumbs/fists (if thumb discomfort) under chin, 10x5 seconds for 3 sets  Seated manually resisted scapular retraction isometrics at resting position targeting the lower trap muscles.   L 10x5 seconds for 3 sets  R 10x5 seconds for 3 sets  No pain with B scapular retraction  Scapular retraction yellow band 10x, then 10x5 seconds for 3 sets  Seated thoracic extension over chair 10x5 seconds     Improved exercise technique, movement at target  joints, use of target muscles after mod verbal, visual, tactile cues.   Response to treatment Pt tolerated session well without aggravation of symptoms    Clinical impression Overall decreasing intensity of symptoms with pt not having spasms and does not seem to take as much pain medication based on subjective reports. Continued working on imprpving cervical and scapular strength and decreasing scalene, upper trap and cervical paraspinal muscles to decrease tension to her neck. Pt tolerated sesssion well without aggravation of symptoms. Pt will benefit from continued skilled physical therapy services tp decrease pain, improve strength and function.       PT Short Term Goals - 12/23/19 1857      PT SHORT TERM GOAL #1   Title Patient will be independent with her initial HEP to decrease pain, improve strength and function.    Time 3    Period Weeks    Status New    Target Date 01/15/20             PT Long Term Goals - 12/23/19 1858      PT LONG TERM GOAL #1   Title Patient will have a decrease in L thoracic/scapular pain to 3/10 or less at most to promote ability to perform chores, wash dishes, write cards more comfortably.    Baseline 9/10 L scapular pain at most for the past 3 months (12/23/2019)    Time 8    Period Weeks    Status New    Target Date 02/19/20      PT LONG TERM GOAL #2   Title Patient will improve her FOTO score by at least 10 points as a demonstration of improved function.    Baseline Thoracic spine FOTO 46 (12/23/2019)    Time 8    Period Weeks    Status New    Target Date 02/19/20      PT LONG TERM GOAL #3   Title Pt will report decreased pain with washing disches as a demonstration of improved abiity to perform functional tasks at home.    Baseline Increased L thoracic/scapular pain with washing dishes (12/23/2019)    Time 8    Period Weeks    Status New    Target Date 02/19/20                 Plan - 01/15/20 1834    Clinical  Impression Statement Overall decreasing intensity of symptoms with pt not having spasms and does not seem to take as much pain medication based on subjective reports. Continued working on imprpving cervical and scapular strength and decreasing scalene, upper trap and cervical paraspinal muscles to decrease tension to her neck. Pt  tolerated sesssion well without aggravation of symptoms. Pt will benefit from continued skilled physical therapy services tp decrease pain, improve strength and function.    Personal Factors and Comorbidities Age;Comorbidity 3+;Fitness;Time since onset of injury/illness/exacerbation;Past/Current Experience    Comorbidities anxiety, HTN, LBP    Examination-Activity Limitations Hygiene/Grooming;Bend;Caring for Others;Other    Stability/Clinical Decision Making Stable/Uncomplicated    Rehab Potential Fair    PT Frequency 2x / week    PT Duration 8 weeks    PT Treatment/Interventions Therapeutic activities;Therapeutic exercise;Neuromuscular re-education;Patient/family education;Manual techniques;Dry needling;Spinal Manipulations;Joint Manipulations;Aquatic Therapy;Electrical Stimulation;Iontophoresis 4mg /ml Dexamethasone;Traction    PT Next Visit Plan Manual techniques, posture, scapular strengthening, anterior cervical strengthening, shoulder strengthening, modalities PRN    Consulted and Agree with Plan of Care Patient           Patient will benefit from skilled therapeutic intervention in order to improve the following deficits and impairments:  Pain,Postural dysfunction,Impaired UE functional use,Improper body mechanics,Decreased strength  Visit Diagnosis: Pain in thoracic spine  Pain in scapula     Problem List Patient Active Problem List   Diagnosis Date Noted   Left-sided thoracic back pain 11/12/2019   Routine general medical examination at a health care facility 07/29/2019   Genetic testing 01/02/2018   Family history of breast cancer    Family  history of genetic disease carrier    Meningioma ([place]) 07/17/2017   Fall 01/04/2017   Vasomotor symptoms due to menopause 06/26/2016   Atrophic vaginitis 09/01/2015   Pedal edema 05/27/2014   Estrogen deficiency 02/21/2013   Encounter for Medicare annual wellness exam 02/21/2013   Low back pain potentially associated with radiculopathy 10/11/2012   ALLERGIC RHINITIS 01/28/2010   Prediabetes 07/30/2009   ANXIETY 11/30/2008   Hyperlipidemia 04/13/2008   Hypothyroidism 01/07/2008   Obesity 01/07/2008   HYPERTENSION, BENIGN 01/07/2008   OSTEOARTHRITIS, GENERALIZED 01/07/2008    Joneen Boers PT, DPT   01/15/2020, 6:35 PM  [place] [facility] [address] [place], [address] Phone: [phone]   Fax:  [phone]  Name: Amber Miles MRN: [id number] Date of Birth: Aug 04, 1945

## 2020-01-19 ENCOUNTER — Ambulatory Visit: Payer: PPO

## 2020-01-21 ENCOUNTER — Ambulatory Visit: Payer: PPO | Attending: Family Medicine

## 2020-01-21 ENCOUNTER — Other Ambulatory Visit: Payer: Self-pay

## 2020-01-21 DIAGNOSIS — M898X1 Other specified disorders of bone, shoulder: Secondary | ICD-10-CM | POA: Diagnosis not present

## 2020-01-21 DIAGNOSIS — M546 Pain in thoracic spine: Secondary | ICD-10-CM | POA: Diagnosis not present

## 2020-01-21 NOTE — Therapy (Signed)
[place] [facility] [address], [place], [address] Phone: [phone]   Fax:  [phone]  Physical Therapy Treatment  Patient Details  Name: Amber Miles MRN: [id number] Date of Birth: 1945-04-21 Referring Provider (PT): Roxy Manns, MD   Encounter Date: 01/21/2020   PT End of Session - 01/21/20 1530    Visit Number 7    Number of Visits 17    Date for PT Re-Evaluation 02/19/20    Authorization Type 7    Authorization Time Period of 10 progress report    PT Start Time 1530    PT Stop Time 1601    PT Time Calculation (min) 31 min    Activity Tolerance Patient tolerated treatment well;No increased pain    Behavior During Therapy WFL for tasks assessed/performed           Past Medical History:  Diagnosis Date  . Allergy    allergic rhinitis  . Anxiety   . Arthritis    OA knee replacement  . Family history of breast cancer   . Family history of genetic disease carrier    paternal cousin has NBN mutation  . GERD (gastroesophageal reflux disease)   . Hyperlipidemia   . Hypertension   . Hypothyroid   . Reactive airways dysfunction syndrome (HCC)   . Skin lesions, generalized    squamous cell skin lesions and basal cell skin lesions    Past Surgical History:  Procedure Laterality Date  . ABDOMINAL HYSTERECTOMY  1990s   hyst with oophrectomy for B9lesion/cyst and prolapse  . BREAST BIOPSY Bilateral    benign  . JOINT REPLACEMENT  05/2009   left total knee replacement  . recocele     and cystocele repair  . tsa      There were no vitals filed for this visit.   Subjective Assessment - 01/21/20 1532    Subjective L shoulder blade is much better. Did a lot of leaning over with paper work at the office. Just has B lumbar paraspinal discomfort. No pain in L shoulder blade currently. Has ringing in R ear.    Pertinent History L thoracic back pain.. Had PT for L shoulder before which helped. Takes  breaks when she performs tasks. Activity which involves leaning over bothers her L shoulder. Pain worsened gradually. Did some canning and freezing during the summer. L scapula was hurting in August. Sitting on folding chairs bothers her L shoulder blade area.  Pain seems to be worsening. Pt is R hand dominant. Also has low back pain and got injections on both sides of her low back Friday 12/19/2019 which is helping the low back.    Currently in Pain? No/denies    Pain Score 0-No pain                                     PT Education - 01/21/20 1835    Education Details ther-ex    Person(s) Educated Patient    Methods Explanation;Demonstration;Tactile cues;Verbal cues    Comprehension Returned demonstration;Verbalized understanding          Objective   No latex allergies Blood pressure controlled per pt.   Antalgic, decreased stance R LE with R lateral lean  posture when writing a Christmas card: L cervical side bend (L upper trap muscle tension), B scapular protraction, cervical protraction, kyphosis   MedbridgeAccess Code [employer]  Manual therapy   Seated STM R and L upper trap, and R scalene muscle area to decrease tension  Therapeutic exercise:  Medium R ear volume currently.    Seated manually resisted scapular retraction isometrics at resting position targeting the lower trap muscles.              L 10x5 seconds for 3 sets             R 10x5 seconds for 3 sets             No pain with B scapular retraction    Seated thoracic extension over chair 10x5 seconds   Seated chin tucks 10x5 seconds     Improved exercise technique, movement at target joints, use of target muscles after min to mod verbal, visual, tactile cues.   Response to treatment Pt tolerated session well without aggravation of symptoms   Clinical impression Pt arrived late so session was adjusted accordingly. Pt making very good progress with  decreased L scapular pain secondary to subjective reports. Continued with scapular strengthening, thoracic extension as well as manual therapy to decrease tension to her upper trap and scalene muscles to decrease stress to her neck. Pt tolerated session well without aggravation of symptoms. Pt will benefit from continued skilled physical therapy services to decrease pain, improve strength and function.     PT Short Term Goals - 12/23/19 1857      PT SHORT TERM GOAL #1   Title Patient will be independent with her initial HEP to decrease pain, improve strength and function.    Time 3    Period Weeks    Status New    Target Date 01/15/20             PT Long Term Goals - 12/23/19 1858      PT LONG TERM GOAL #1   Title Patient will have a decrease in L thoracic/scapular pain to 3/10 or less at most to promote ability to perform chores, wash dishes, write cards more comfortably.    Baseline 9/10 L scapular pain at most for the past 3 months (12/23/2019)    Time 8    Period Weeks    Status New    Target Date 02/19/20      PT LONG TERM GOAL #2   Title Patient will improve her FOTO score by at least 10 points as a demonstration of improved function.    Baseline Thoracic spine FOTO 46 (12/23/2019)    Time 8    Period Weeks    Status New    Target Date 02/19/20      PT LONG TERM GOAL #3   Title Pt will report decreased pain with washing disches as a demonstration of improved abiity to perform functional tasks at home.    Baseline Increased L thoracic/scapular pain with washing dishes (12/23/2019)    Time 8    Period Weeks    Status New    Target Date 02/19/20                 Plan - 01/21/20 1835    Clinical Impression Statement Pt arrived late so session was adjusted accordingly. Pt making very good progress with decreased L scapular pain secondary to subjective reports. Continued with scapular strengthening, thoracic extension as well as manual therapy to decrease tension to  her upper trap and scalene muscles to decrease stress to her neck. Pt tolerated session well without aggravation of symptoms. Pt will benefit  from continued skilled physical therapy services to decrease pain, improve strength and function.    Personal Factors and Comorbidities Age;Comorbidity 3+;Fitness;Time since onset of injury/illness/exacerbation;Past/Current Experience    Comorbidities anxiety, HTN, LBP    Examination-Activity Limitations Hygiene/Grooming;Bend;Caring for Others;Other    Stability/Clinical Decision Making Stable/Uncomplicated    Clinical Decision Making Low    Rehab Potential Fair    PT Frequency 2x / week    PT Duration 8 weeks    PT Treatment/Interventions Therapeutic activities;Therapeutic exercise;Neuromuscular re-education;Patient/family education;Manual techniques;Dry needling;Spinal Manipulations;Joint Manipulations;Aquatic Therapy;Electrical Stimulation;Iontophoresis 4mg /ml Dexamethasone;Traction    PT Next Visit Plan Manual techniques, posture, scapular strengthening, anterior cervical strengthening, shoulder strengthening, modalities PRN    Consulted and Agree with Plan of Care Patient           Patient will benefit from skilled therapeutic intervention in order to improve the following deficits and impairments:  Pain,Postural dysfunction,Impaired UE functional use,Improper body mechanics,Decreased strength  Visit Diagnosis: Pain in thoracic spine  Pain in scapula     Problem List Patient Active Problem List   Diagnosis Date Noted  . Left-sided thoracic back pain 11/12/2019  . Routine general medical examination at a health care facility 07/29/2019  . Genetic testing 01/02/2018  . Family history of breast cancer   . Family history of genetic disease carrier   . Meningioma ([place]) 07/17/2017  . Fall 01/04/2017  . Vasomotor symptoms due to menopause 06/26/2016  . Atrophic vaginitis 09/01/2015  . Pedal edema 05/27/2014  . Estrogen deficiency  02/21/2013  . Encounter for Medicare annual wellness exam 02/21/2013  . Low back pain potentially associated with radiculopathy 10/11/2012  . ALLERGIC RHINITIS 01/28/2010  . Prediabetes 07/30/2009  . ANXIETY 11/30/2008  . Hyperlipidemia 04/13/2008  . Hypothyroidism 01/07/2008  . Obesity 01/07/2008  . HYPERTENSION, BENIGN 01/07/2008  . OSTEOARTHRITIS, GENERALIZED 01/07/2008    Joneen Boers PT, DPT   01/21/2020, 6:40 PM  [place] [facility] [address] [place], [address] Phone: [phone]   Fax:  [phone]  Name: NIASIA LENNOX MRN: [id number] Date of Birth: 26-Jul-1945

## 2020-01-28 ENCOUNTER — Other Ambulatory Visit: Payer: Self-pay

## 2020-01-28 ENCOUNTER — Other Ambulatory Visit: Payer: Self-pay | Admitting: Family Medicine

## 2020-01-28 ENCOUNTER — Ambulatory Visit: Payer: PPO

## 2020-01-28 DIAGNOSIS — E2839 Other primary ovarian failure: Secondary | ICD-10-CM

## 2020-01-28 DIAGNOSIS — M898X1 Other specified disorders of bone, shoulder: Secondary | ICD-10-CM

## 2020-01-28 DIAGNOSIS — M546 Pain in thoracic spine: Secondary | ICD-10-CM

## 2020-01-28 NOTE — Therapy (Signed)
[facility] [address] [place], [address] Phone: [phone]   Fax:  [phone]  Physical Therapy Treatment  Patient Details  Name: Amber Miles MRN: [id number] Date of Birth: August 16, 1945 Referring Provider (PT): Loura Pardon, MD   Encounter Date: 01/28/2020   PT End of Session - 01/28/20 1118    Visit Number 8    Number of Visits 17    Date for PT Re-Evaluation 02/19/20    Authorization Type 8    Authorization Time Period of 10 progress report    PT Start Time 1118    PT Stop Time 1200    PT Time Calculation (min) 42 min    Activity Tolerance Patient tolerated treatment well;No increased pain    Behavior During Therapy WFL for tasks assessed/performed           Past Medical History:  Diagnosis Date  . Allergy    allergic rhinitis  . Anxiety   . Arthritis    OA knee replacement  . Family history of breast cancer   . Family history of genetic disease carrier    paternal cousin has NBN mutation  . GERD (gastroesophageal reflux disease)   . Hyperlipidemia   . Hypertension   . Hypothyroid   . Reactive airways dysfunction syndrome ([place])   . Skin lesions, generalized    squamous cell skin lesions and basal cell skin lesions    Past Surgical History:  Procedure Laterality Date  . ABDOMINAL HYSTERECTOMY  1990s   hyst with oophrectomy for B9lesion/cyst and prolapse  . BREAST BIOPSY Bilateral    benign  . JOINT REPLACEMENT  05/2009   left total knee replacement  . recocele     and cystocele repair  . tsa      There were no vitals filed for this visit.   Subjective Assessment - 01/28/20 1120    Subjective L shoulder blade is much better except Monday from over doing it. Used a heating pad which helped. Was able to do a whole lot more Monday than before. was able to go to the grocery store, put them away, cut vegetables, take out the trash, worked in the office some. Better than it was. Pt  fell in the yard 01/11/2020. Tripped over a root.    Pertinent History L thoracic back pain.. Had PT for L shoulder before which helped. Takes breaks when she performs tasks. Activity which involves leaning over bothers her L shoulder. Pain worsened gradually. Did some canning and freezing during the summer. L scapula was hurting in August. Sitting on folding chairs bothers her L shoulder blade area.  Pain seems to be worsening. Pt is R hand dominant. Also has low back pain and got injections on both sides of her low back Friday 12/19/2019 which is helping the low back. L knee has gotten better.    Currently in Pain? No/denies                                     PT Education - 01/28/20 1140    Education Details ther-ex    Person(s) Educated Patient    Methods Explanation;Demonstration;Tactile cues;Verbal cues    Comprehension Returned demonstration;Verbalized understanding           Objective   No latex allergies Blood pressure controlled per pt.   Antalgic, decreased stance R LE with R lateral  lean  posture when writing a Christmas card: L cervical side bend (L upper trap muscle tension), B scapular protraction, cervical protraction, kyphosis   MedbridgeAccess Code [email or web address]  Manual therapy Supine STM B cervical paraspinal muscles R > L to decrease tension   Decreased R ear heartbeat volume to soft. Heartbeat volume returned to medium in sitting.    Therapeutic exercise:  Medium R ear volume currently.     Seated manually resisted scapular retraction isometrics at resting position targeting the lower trap muscles.  L 10x5 seconds for 3 sets R 10x5 seconds for 3 sets  seated Open books to promote thoracic extension 10x5 seconds   Seated thoracic extension over chair 10x5 seconds  Supine chin tucks 10x5 seconds   Supine deep cervical flexion 10x3  Supine manually resisted L cervical lateral shift  isometrics to counter R cervical lateral shift posture 10x5 seconds       Improved exercise technique, movement at target joints, use of target muscles after min to mod verbal, visual, tactile cues.   Response to treatment Pt tolerated session well without aggravation of symptoms   Clinical impression Pt continues to do well overall for L scapular pain based on subjective reports of being able to perform more tasks outside of physical therapy, though she might have over done it. Continued working on improving anterior cervical as well as scapular strengthening as well as manual techniques to decreased cervical paraspinal muscle tension to help decrease stress to her cervical spine. Pt tolerated session well without aggravation of symptoms. Pt will benefit from continued skilled physical therapy services to decrease pain, improve strength and function.         PT Short Term Goals - 12/23/19 1857      PT SHORT TERM GOAL #1   Title Patient will be independent with her initial HEP to decrease pain, improve strength and function.    Time 3    Period Weeks    Status New    Target Date 01/15/20             PT Long Term Goals - 12/23/19 1858      PT LONG TERM GOAL #1   Title Patient will have a decrease in L thoracic/scapular pain to 3/10 or less at most to promote ability to perform chores, wash dishes, write cards more comfortably.    Baseline 9/10 L scapular pain at most for the past 3 months (12/23/2019)    Time 8    Period Weeks    Status New    Target Date 02/19/20      PT LONG TERM GOAL #2   Title Patient will improve her FOTO score by at least 10 points as a demonstration of improved function.    Baseline Thoracic spine FOTO 46 (12/23/2019)    Time 8    Period Weeks    Status New    Target Date 02/19/20      PT LONG TERM GOAL #3   Title Pt will report decreased pain with washing disches as a demonstration of improved abiity to perform functional tasks at  home.    Baseline Increased L thoracic/scapular pain with washing dishes (12/23/2019)    Time 8    Period Weeks    Status New    Target Date 02/19/20                 Plan - 01/28/20 1142    Clinical Impression Statement Pt continues to do well overall for  L scapular pain based on subjective reports of being able to perform more tasks outside of physical therapy, though she might have over done it. Continued working on improving anterior cervical as well as scapular strengthening as well as manual techniques to decreased cervical paraspinal muscle tension to help decrease stress to her cervical spine. Pt tolerated session well without aggravation of symptoms. Pt will benefit from continued skilled physical therapy services to decrease pain, improve strength and function.    Personal Factors and Comorbidities Age;Comorbidity 3+;Fitness;Time since onset of injury/illness/exacerbation;Past/Current Experience    Comorbidities anxiety, HTN, LBP    Examination-Activity Limitations Hygiene/Grooming;Bend;Caring for Others;Other    Stability/Clinical Decision Making Stable/Uncomplicated    Rehab Potential Fair    PT Frequency 2x / week    PT Duration 8 weeks    PT Treatment/Interventions Therapeutic activities;Therapeutic exercise;Neuromuscular re-education;Patient/family education;Manual techniques;Dry needling;Spinal Manipulations;Joint Manipulations;Aquatic Therapy;Electrical Stimulation;Iontophoresis 4mg /ml Dexamethasone;Traction    PT Next Visit Plan Manual techniques, posture, scapular strengthening, anterior cervical strengthening, shoulder strengthening, modalities PRN    Consulted and Agree with Plan of Care Patient           Patient will benefit from skilled therapeutic intervention in order to improve the following deficits and impairments:  Pain,Postural dysfunction,Impaired UE functional use,Improper body mechanics,Decreased strength  Visit Diagnosis: Pain in thoracic  spine  Pain in scapula     Problem List Patient Active Problem List   Diagnosis Date Noted  . Left-sided thoracic back pain 11/12/2019  . Routine general medical examination at a health care facility 07/29/2019  . Genetic testing 01/02/2018  . Family history of breast cancer   . Family history of genetic disease carrier   . Meningioma ([place]) 07/17/2017  . Fall 01/04/2017  . Vasomotor symptoms due to menopause 06/26/2016  . Atrophic vaginitis 09/01/2015  . Pedal edema 05/27/2014  . Estrogen deficiency 02/21/2013  . Encounter for Medicare annual wellness exam 02/21/2013  . Low back pain potentially associated with radiculopathy 10/11/2012  . ALLERGIC RHINITIS 01/28/2010  . Prediabetes 07/30/2009  . ANXIETY 11/30/2008  . Hyperlipidemia 04/13/2008  . Hypothyroidism 01/07/2008  . Obesity 01/07/2008  . HYPERTENSION, BENIGN 01/07/2008  . OSTEOARTHRITIS, GENERALIZED 01/07/2008    Joneen Boers PT, DPT   01/28/2020, 12:11 PM  [facility] [address], [place], [address] Phone: [phone]   Fax:  [phone]  Name: Amber Miles MRN: [id number] Date of Birth: 1945/02/17

## 2020-01-29 ENCOUNTER — Other Ambulatory Visit: Payer: PPO

## 2020-02-02 ENCOUNTER — Ambulatory Visit: Payer: PPO

## 2020-02-09 ENCOUNTER — Other Ambulatory Visit: Payer: Self-pay

## 2020-02-09 ENCOUNTER — Ambulatory Visit: Payer: PPO

## 2020-02-09 DIAGNOSIS — M546 Pain in thoracic spine: Secondary | ICD-10-CM

## 2020-02-09 DIAGNOSIS — M898X1 Other specified disorders of bone, shoulder: Secondary | ICD-10-CM

## 2020-02-09 NOTE — Therapy (Signed)
[facility] [address], [place], [address] Phone: [phone]   Fax:  [phone]  Physical Therapy Treatment  Patient Details  Name: Amber Miles MRN: [id number] Date of Birth: 29-Apr-1945 Referring Provider (PT): Loura Pardon, MD   Encounter Date: 02/09/2020   PT End of Session - 02/09/20 1107    Visit Number 9    Number of Visits 17    Date for PT Re-Evaluation 02/19/20    Authorization Type 9    Authorization Time Period of 10 progress report    PT Start Time 1107    PT Stop Time 1148    PT Time Calculation (min) 41 min    Activity Tolerance Patient tolerated treatment well;No increased pain    Behavior During Therapy WFL for tasks assessed/performed           Past Medical History:  Diagnosis Date  . Allergy    allergic rhinitis  . Anxiety   . Arthritis    OA knee replacement  . Family history of breast cancer   . Family history of genetic disease carrier    paternal cousin has NBN mutation  . GERD (gastroesophageal reflux disease)   . Hyperlipidemia   . Hypertension   . Hypothyroid   . Reactive airways dysfunction syndrome ([place])   . Skin lesions, generalized    squamous cell skin lesions and basal cell skin lesions    Past Surgical History:  Procedure Laterality Date  . ABDOMINAL HYSTERECTOMY  1990s   hyst with oophrectomy for B9lesion/cyst and prolapse  . BREAST BIOPSY Bilateral    benign  . JOINT REPLACEMENT  05/2009   left total knee replacement  . recocele     and cystocele repair  . tsa      There were no vitals filed for this visit.   Subjective Assessment - 02/09/20 1108    Subjective Went backwards since last session. Ran some errands, and got some groceries and gas. Might have been too much. Took tramadol and Tizanadine. L shoulder blade is hurting. R shoulder blade bothered her for one day but eased off the next day. 5/10 L shoulder blade currently.    Pertinent  History L thoracic back pain.. Had PT for L shoulder before which helped. Takes breaks when she performs tasks. Activity which involves leaning over bothers her L shoulder. Pain worsened gradually. Did some canning and freezing during the summer. L scapula was hurting in August. Sitting on folding chairs bothers her L shoulder blade area.  Pain seems to be worsening. Pt is R hand dominant. Also has low back pain and got injections on both sides of her low back Friday 12/19/2019 which is helping the low back. L knee has gotten better.    Currently in Pain? Yes    Pain Score 5     Pain Location Scapula    Pain Orientation Left                                     PT Education - 02/09/20 1207    Education Details ther-ex    Person(s) Educated Patient    Methods Explanation;Demonstration;Tactile cues;Verbal cues    Comprehension Returned demonstration;Verbalized understanding          Objective   No latex allergies Blood pressure controlled per pt.   Antalgic, decreased stance R LE with R lateral  lean  posture when writing a Christmas card: L cervical side bend (L upper trap muscle tension), B scapular protraction, cervical protraction, kyphosis   MedbridgeAccess Code [email or web address]   Manual therapy Seated STM L lumbar paraspinal muscles   Seated STM B cervical paraspinal muscles, UT and L scalene muscles to decrease tension   Seated STM L teres major and latissimus dorsi with L arm up (L arm up feels better)    Therapeutic exercise:  Low R ear volume currently.    Sitting posture: L lumbar convexity  Seated L hip isometrics 4x5 seconds. More neutral posture but increased L scapular pain.  Seated R hip extension isometrics 1x5 seconds. Increased L scapular pain.   Seated manually resisted R lumbar side bend isometrics to counter L lumbar side bend posture in neutral 2x5 seconds. Discomfort   Seated thoracic extension over chair 10x5  seconds, then 6x5 seconds   Seated trunk flexion. Decreased L scapular spasm  Then with 10x5 seconds, then 4x5 seconds (L scapular spasm during second set)   seated trunk flexion to the L and to the R 3x5 seconds each. Increased spasms   Sitting with lumbar towel roll slight decrease in pain initially but symptoms returned.   Reviewed plan of care: continue for another month secondary to recent set back.         Improved exercise technique, movement at target joints, use of target muscles after min to mod verbal, visual, tactile cues.   Response to treatment Pt tolerated session well without aggravation of symptoms. Decreased pain to 3/10 after session per pt.    Clinical impression Pt demonstrates recent increased L scapular pain which eased with STM to L teres major/latissiums muscle area. Pt reports coughs (takes asthma related medications) which increases her spasms. Decreased pain to 3/10 after session. Pt will benefit from continued skilled physical therapy services to decrease pain, improve strength and function.      PT Short Term Goals - 12/23/19 1857      PT SHORT TERM GOAL #1   Title Patient will be independent with her initial HEP to decrease pain, improve strength and function.    Time 3    Period Weeks    Status New    Target Date 01/15/20             PT Long Term Goals - 12/23/19 1858      PT LONG TERM GOAL #1   Title Patient will have a decrease in L thoracic/scapular pain to 3/10 or less at most to promote ability to perform chores, wash dishes, write cards more comfortably.    Baseline 9/10 L scapular pain at most for the past 3 months (12/23/2019)    Time 8    Period Weeks    Status New    Target Date 02/19/20      PT LONG TERM GOAL #2   Title Patient will improve her FOTO score by at least 10 points as a demonstration of improved function.    Baseline Thoracic spine FOTO 46 (12/23/2019)    Time 8    Period Weeks    Status New     Target Date 02/19/20      PT LONG TERM GOAL #3   Title Pt will report decreased pain with washing disches as a demonstration of improved abiity to perform functional tasks at home.    Baseline Increased L thoracic/scapular pain with washing dishes (12/23/2019)    Time 8    Period Weeks  Status New    Target Date 02/19/20                 Plan - 02/09/20 1208    Clinical Impression Statement Pt demonstrates recent increased L scapular pain which eased with STM to L teres major/latissiums muscle area. Pt reports coughs (takes asthma related medications) which increases her spasms. Decreased pain to 3/10 after session. Pt will benefit from continued skilled physical therapy services to decrease pain, improve strength and function.    Personal Factors and Comorbidities Age;Comorbidity 3+;Fitness;Time since onset of injury/illness/exacerbation;Past/Current Experience    Comorbidities anxiety, HTN, LBP    Examination-Activity Limitations Hygiene/Grooming;Bend;Caring for Others;Other    Stability/Clinical Decision Making Stable/Uncomplicated    Rehab Potential Fair    PT Frequency 2x / week    PT Duration 8 weeks    PT Treatment/Interventions Therapeutic activities;Therapeutic exercise;Neuromuscular re-education;Patient/family education;Manual techniques;Dry needling;Spinal Manipulations;Joint Manipulations;Aquatic Therapy;Electrical Stimulation;Iontophoresis 4mg /ml Dexamethasone;Traction    PT Next Visit Plan Manual techniques, posture, scapular strengthening, anterior cervical strengthening, shoulder strengthening, modalities PRN    Consulted and Agree with Plan of Care Patient           Patient will benefit from skilled therapeutic intervention in order to improve the following deficits and impairments:  Pain,Postural dysfunction,Impaired UE functional use,Improper body mechanics,Decreased strength  Visit Diagnosis: Pain in thoracic spine  Pain in scapula     Problem  List Patient Active Problem List   Diagnosis Date Noted  . Left-sided thoracic back pain 11/12/2019  . Routine general medical examination at a health care facility 07/29/2019  . Genetic testing 01/02/2018  . Family history of breast cancer   . Family history of genetic disease carrier   . Meningioma ([place]) 07/17/2017  . Fall 01/04/2017  . Vasomotor symptoms due to menopause 06/26/2016  . Atrophic vaginitis 09/01/2015  . Pedal edema 05/27/2014  . Estrogen deficiency 02/21/2013  . Encounter for Medicare annual wellness exam 02/21/2013  . Low back pain potentially associated with radiculopathy 10/11/2012  . ALLERGIC RHINITIS 01/28/2010  . Prediabetes 07/30/2009  . ANXIETY 11/30/2008  . Hyperlipidemia 04/13/2008  . Hypothyroidism 01/07/2008  . Obesity 01/07/2008  . HYPERTENSION, BENIGN 01/07/2008  . OSTEOARTHRITIS, GENERALIZED 01/07/2008   Joneen Boers PT, DPT   02/09/2020, 12:11 PM  [facility] [address] [place], [address] Phone: [phone]   Fax:  [phone]  Name: Amber Miles MRN: [id number] Date of Birth: 1946-01-09

## 2020-02-18 ENCOUNTER — Other Ambulatory Visit: Payer: Self-pay

## 2020-02-18 ENCOUNTER — Ambulatory Visit: Payer: PPO | Attending: Family Medicine

## 2020-02-18 DIAGNOSIS — M898X1 Other specified disorders of bone, shoulder: Secondary | ICD-10-CM | POA: Diagnosis not present

## 2020-02-18 DIAGNOSIS — M546 Pain in thoracic spine: Secondary | ICD-10-CM | POA: Diagnosis not present

## 2020-02-18 NOTE — Therapy (Signed)
[facility] [address] [place], [address] Phone: [phone]   Fax:  [phone]  Physical Therapy Treatment  Patient Details  Name: Amber Miles MRN: [id number] Date of Birth: 1945/06/23 Referring Provider (PT): Loura Pardon, MD   Encounter Date: 02/18/2020   PT End of Session - 02/18/20 1352    Visit Number 10    Number of Visits 25    Date for PT Re-Evaluation 03/18/20    Authorization Type 10    Authorization Time Period of 10 progress report    PT Start Time 0037    PT Stop Time 0488    PT Time Calculation (min) 46 min    Activity Tolerance Patient tolerated treatment well;No increased pain    Behavior During Therapy WFL for tasks assessed/performed           Past Medical History:  Diagnosis Date  . Allergy    allergic rhinitis  . Anxiety   . Arthritis    OA knee replacement  . Family history of breast cancer   . Family history of genetic disease carrier    paternal cousin has NBN mutation  . GERD (gastroesophageal reflux disease)   . Hyperlipidemia   . Hypertension   . Hypothyroid   . Reactive airways dysfunction syndrome ([place])   . Skin lesions, generalized    squamous cell skin lesions and basal cell skin lesions    Past Surgical History:  Procedure Laterality Date  . ABDOMINAL HYSTERECTOMY  1990s   hyst with oophrectomy for B9lesion/cyst and prolapse  . BREAST BIOPSY Bilateral    benign  . JOINT REPLACEMENT  05/2009   left total knee replacement  . recocele     and cystocele repair  . tsa      There were no vitals filed for this visit.   Subjective Assessment - 02/18/20 1353    Subjective L shoulder blade feels much better. Had a nap and then it felt better. 1/10 L scapular pain currently.    Pertinent History L thoracic back pain.. Had PT for L shoulder before which helped. Takes breaks when she performs tasks. Activity which involves leaning over bothers her L  shoulder. Pain worsened gradually. Did some canning and freezing during the summer. L scapula was hurting in August. Sitting on folding chairs bothers her L shoulder blade area.  Pain seems to be worsening. Pt is R hand dominant. Also has low back pain and got injections on both sides of her low back Friday 12/19/2019 which is helping the low back. L knee has gotten better.    Currently in Pain? Yes    Pain Score 1                                      PT Education - 02/18/20 1448    Education Details ther-ex    Person(s) Educated Patient    Methods Explanation;Demonstration;Tactile cues;Verbal cues    Comprehension Returned demonstration;Verbalized understanding          Objective   No latex allergies Blood pressure controlled per pt.   Antalgic, decreased stance R LE with R lateral lean  posture when writing a Christmas card: L cervical side bend (L upper trap muscle tension), B scapular protraction, cervical protraction, kyphosis   MedbridgeAccess Code [email or web address]   Manual therapy Seated STM B cervical paraspinal muscles, R UT  muscles to decrease tension  No pain afterwards  Therapeutic exercise:  Medium R ear volume currently.    Seated manually resisted scapular retraction   R 10x3 with 5 seconds   L 10x3 with 5 second holds   Push up plus at wall 10x5 seconds for 3 sets    Seated trunk flexion 30 seconds, then 1 min secondary to good relieve of L scapular spasms last session    seated transversus abdominis contraction 10x3 with 5 second holds   Decreased lumbar paraspinal muscle tension palpated.     Reviewed plan of care: continue for another month to maintain and promote progress    Improved exercise technique, movement at target joints, use of target muscles after min to mod verbal, visual, tactile cues.   Response to treatment No pain after treatment.   Clinical impression Improved L scapular pain  compared to previous session. Continued working on decreasing cervical paraspinal and R upper trap muscle tension, as well as improving lower trap, serratus anterior and trunk strength to promote progress. Pt demonstrates overall decreased thoracic pain and improved ability to perform chores such as dishes since initial evaluation. Pt making progress with physical therapy towards goals. Pt will benefit from continued skilled physical therapy services to decrease pain, improve strength and function.       PT Short Term Goals - 02/18/20 1355      PT SHORT TERM GOAL #1   Title Patient will be independent with her initial HEP to decrease pain, improve strength and function.    Baseline Does her HEP. No questinons (02/18/2020)    Time 3    Period Weeks    Status Achieved    Target Date 01/15/20             PT Long Term Goals - 02/18/20 1356      PT LONG TERM GOAL #1   Title Patient will have a decrease in L thoracic/scapular pain to 3/10 or less at most to promote ability to perform chores, wash dishes, write cards more comfortably.    Baseline 9/10 L scapular pain at most for the past 3 months (12/23/2019); 4/10 at most for the past 7 days, has been doing a lot of stuff (02/18/2020)    Time 4    Period Weeks    Status Partially Met    Target Date 03/18/20      PT LONG TERM GOAL #2   Title Patient will improve her FOTO score by at least 10 points as a demonstration of improved function.    Baseline Thoracic spine FOTO 46 (12/23/2019); 71 (02/18/2020)    Time 8    Period Weeks    Status Achieved    Target Date 02/19/20      PT LONG TERM GOAL #3   Title Pt will report decreased pain with washing disches as a demonstration of improved abiity to perform functional tasks at home.    Baseline Increased L thoracic/scapular pain with washing dishes (12/23/2019); Improved comfort level with dishes. (02/18/2020)    Time 8    Period Weeks    Status Achieved    Target Date 02/19/20                  Plan - 02/18/20 1438    Clinical Impression Statement Improved L scapular pain compared to previous session. Continued working on decreasing cervical paraspinal and R upper trap muscle tension, as well as improving lower trap, serratus anterior and trunk strength to  promote progress. Pt demonstrates overall decreased thoracic pain and improved ability to perform chores such as dishes since initial evaluation. Pt making progress with physical therapy towards goals. Pt will benefit from continued skilled physical therapy services to decrease pain, improve strength and function.    Personal Factors and Comorbidities Age;Comorbidity 3+;Fitness;Time since onset of injury/illness/exacerbation;Past/Current Experience    Comorbidities anxiety, HTN, LBP    Examination-Activity Limitations Hygiene/Grooming;Bend;Caring for Others;Other    Stability/Clinical Decision Making Stable/Uncomplicated    Clinical Decision Making Low    Rehab Potential Fair    PT Frequency 2x / week    PT Duration 4 weeks    PT Treatment/Interventions Therapeutic activities;Therapeutic exercise;Neuromuscular re-education;Patient/family education;Manual techniques;Dry needling;Spinal Manipulations;Joint Manipulations;Aquatic Therapy;Electrical Stimulation;Iontophoresis 52m/ml Dexamethasone;Traction    PT Next Visit Plan Manual techniques, posture, scapular strengthening, anterior cervical strengthening, shoulder strengthening, modalities PRN    Consulted and Agree with Plan of Care Patient           Patient will benefit from skilled therapeutic intervention in order to improve the following deficits and impairments:  Pain,Postural dysfunction,Impaired UE functional use,Improper body mechanics,Decreased strength  Visit Diagnosis: Pain in thoracic spine - Plan: PT plan of care cert/re-cert  Pain in scapula - Plan: PT plan of care cert/re-cert     Problem List Patient Active Problem List   Diagnosis Date Noted   . Left-sided thoracic back pain 11/12/2019  . Routine general medical examination at a health care facility 07/29/2019  . Genetic testing 01/02/2018  . Family history of breast cancer   . Family history of genetic disease carrier   . Meningioma ([place] 07/17/2017  . Fall 01/04/2017  . Vasomotor symptoms due to menopause 06/26/2016  . Atrophic vaginitis 09/01/2015  . Pedal edema 05/27/2014  . Estrogen deficiency 02/21/2013  . Encounter for Medicare annual wellness exam 02/21/2013  . Low back pain potentially associated with radiculopathy 10/11/2012  . ALLERGIC RHINITIS 01/28/2010  . Prediabetes 07/30/2009  . ANXIETY 11/30/2008  . Hyperlipidemia 04/13/2008  . Hypothyroidism 01/07/2008  . Obesity 01/07/2008  . HYPERTENSION, BENIGN 01/07/2008  . OSTEOARTHRITIS, GENERALIZED 01/07/2008    MJoneen BoersPT, DPT   02/18/2020, 2:54 PM  [facility] [address] [place] [id number]: [phone]  Fax:  [phone] Name: Amber SKIDMOREMRN: [id number] of Birth: 1Jul 25, 1947

## 2020-02-24 ENCOUNTER — Ambulatory Visit: Payer: PPO

## 2020-02-24 ENCOUNTER — Other Ambulatory Visit: Payer: Self-pay

## 2020-02-24 DIAGNOSIS — M546 Pain in thoracic spine: Secondary | ICD-10-CM | POA: Diagnosis not present

## 2020-02-24 DIAGNOSIS — M898X1 Other specified disorders of bone, shoulder: Secondary | ICD-10-CM

## 2020-02-24 NOTE — Patient Instructions (Signed)
Access Code: [email or web address] URL: [email or web address] Date: 02/24/2020 Prepared by: Joneen Boers  Exercises Seated Cervical Retraction - 1 x daily - 7 x weekly - 3 sets - 10 reps - 5 seconds hold Seated Thoracic Lumbar Extension - 1 x daily - 7 x weekly - 3 sets - 10 reps - 5 seconds hold First Rib Mobilization with Strap - 3 x daily - 7 x weekly - 2 sets - 5 reps - 15 seconds hold Seated Transversus Abdominis Bracing - 1 x daily - 7 x weekly - 3 sets - 10 reps - 5 seconds hold Seated Flexion Stretch - 2 x daily - 7 x weekly - 1 sets - 2 reps - 1 minute hold Seated Transversus Abdominis Bracing - 1 x daily - 7 x weekly - 3 sets - 10 reps - 5 seconds hold

## 2020-02-24 NOTE — Therapy (Signed)
[facility] [address], [place], [address] Phone: [phone]   Fax:  [phone]  Physical Therapy Treatment And Discharge Summary  Patient Details  Name: Amber Miles MRN: [id number] Date of Birth: [birth year] Referring Provider (PT): Loura Pardon, MD   Encounter Date: 02/24/2020   PT End of Session - 02/24/20 1359    Visit Number 11    Number of Visits 25    Date for PT Re-Evaluation 03/18/20    Authorization Type 1    Authorization Time Period of 10 progress report    PT Start Time 5366    PT Stop Time 1426    PT Time Calculation (min) 27 min    Activity Tolerance Patient tolerated treatment well;No increased pain    Behavior During Therapy WFL for tasks assessed/performed           Past Medical History:  Diagnosis Date  . Allergy    allergic rhinitis  . Anxiety   . Arthritis    OA knee replacement  . Family history of breast cancer   . Family history of genetic disease carrier    paternal cousin has NBN mutation  . GERD (gastroesophageal reflux disease)   . Hyperlipidemia   . Hypertension   . Hypothyroid   . Reactive airways dysfunction syndrome ([place])   . Skin lesions, generalized    squamous cell skin lesions and basal cell skin lesions    Past Surgical History:  Procedure Laterality Date  . ABDOMINAL HYSTERECTOMY  1990s   hyst with oophrectomy for B9lesion/cyst and prolapse  . BREAST BIOPSY Bilateral    benign  . JOINT REPLACEMENT  05/2009   left total knee replacement  . recocele     and cystocele repair  . tsa      There were no vitals filed for this visit.   Subjective Assessment - 02/24/20 1400    Subjective Was able to do a lot of stuff after last session such as vacuuming her whole house, dusted, etc and her L shoulder blade did not hurt. Has not been in a heating pad since last session. 2-3/10 L shoulder blade at most since last session. Cut up vegetable yesterday  and is fine. Feels like she can graduate today and coninue with her HEP.    Pertinent History L thoracic back pain.. Had PT for L shoulder before which helped. Takes breaks when she performs tasks. Activity which involves leaning over bothers her L shoulder. Pain worsened gradually. Did some canning and freezing during the summer. L scapula was hurting in August. Sitting on folding chairs bothers her L shoulder blade area.  Pain seems to be worsening. Pt is R hand dominant. Also has low back pain and got injections on both sides of her low back Friday 12/19/2019 which is helping the low back. L knee has gotten better.    Currently in Pain? No/denies    Pain Score 0-No pain                                     PT Education - 02/24/20 1404    Education Details ther-ex    Person(s) Educated Patient    Methods Explanation;Demonstration;Tactile cues;Verbal cues    Comprehension Returned demonstration;Verbalized understanding            Objective   No latex allergies Blood pressure controlled per pt.  Antalgic, decreased stance R LE with R lateral lean  posture when writing a Christmas card: L cervical side bend (L upper trap muscle tension), B scapular protraction, cervical protraction, kyphosis   MedbridgeAccess Code [email or web address]   Manual therapy Seated STM B cervical paraspinal muscles, R UT  muscles to decrease tension              Therapeutic exercise:  MediumR ear volume currently.    No questions with HEP.   Seated manually resisted scapular retraction              R 10x3 with 5 seconds holds             L 10x3 with 5 second holds   Seated trunk flexion 1 min secondary to good relieve of L scapular spasms previous session    seated transversus abdominis contraction 10x with 5 second holds            Improved exercise technique, movement at target joints, use of target muscles after min to mod verbal, visual, tactile  cues.   Response to treatment No pain throughout session.    Clinical impression Pt demonstrates significant decrease in pain, and significant increase in function since initial evaluation. Pt also demonstrates independence and consistency with her HEP. Skilled physical therapy services discharged with pt continuing progress with her HEP as well as pt verbalizing that she feels ready to graduate to her home program.        PT Short Term Goals - 02/18/20 1355      PT SHORT TERM GOAL #1   Title Patient will be independent with her initial HEP to decrease pain, improve strength and function.    Baseline Does her HEP. No questinons (02/18/2020)    Time 3    Period Weeks    Status Achieved    Target Date 01/15/20             PT Long Term Goals - 02/24/20 1404      PT LONG TERM GOAL #1   Title Patient will have a decrease in L thoracic/scapular pain to 3/10 or less at most to promote ability to perform chores, wash dishes, write cards more comfortably.    Baseline 9/10 L scapular pain at most for the past 3 months (12/23/2019); 4/10 at most for the past 7 days, has been doing a lot of stuff (02/18/2020); 2-3/10 at most for the past 7 days (02/24/2020)    Time 4    Period Weeks    Status Achieved    Target Date 03/18/20      PT LONG TERM GOAL #2   Title Patient will improve her FOTO score by at least 10 points as a demonstration of improved function.    Baseline Thoracic spine FOTO 46 (12/23/2019); 71 (02/18/2020); 99 (02/24/2020)    Time 8    Period Weeks    Status Achieved    Target Date 02/19/20      PT LONG TERM GOAL #3   Title Pt will report decreased pain with washing disches as a demonstration of improved abiity to perform functional tasks at home.    Baseline Increased L thoracic/scapular pain with washing dishes (12/23/2019); Improved comfort level with dishes. (02/18/2020)    Time 8    Period Weeks    Status Achieved    Target Date 02/19/20                  Plan - 02/24/20 1358  Clinical Impression Statement Pt demonstrates significant decrease in pain, and significant increase in function since initial evaluation. Pt also demonstrates independence and consistency with her HEP. Skilled physical therapy services discharged with pt continuing progress with her HEP as well as pt verbalizing that she feels ready to graduate to her home program.    Personal Factors and Comorbidities Age;Comorbidity 3+;Fitness;Time since onset of injury/illness/exacerbation;Past/Current Experience    Comorbidities anxiety, HTN, LBP    Examination-Activity Limitations --    Stability/Clinical Decision Making Stable/Uncomplicated    Clinical Decision Making Low    Rehab Potential --    PT Frequency --    PT Duration --    PT Treatment/Interventions Therapeutic activities;Therapeutic exercise;Neuromuscular re-education;Patient/family education;Manual techniques    PT Next Visit Plan Continue progress with her [place] and Agree with Plan of Care Patient           Patient will benefit from skilled therapeutic intervention in order to improve the following deficits and impairments:  Postural dysfunction,Improper body mechanics  Visit Diagnosis: Pain in thoracic spine  Pain in scapula     Problem List Patient Active Problem List   Diagnosis Date Noted  . Left-sided thoracic back pain 11/12/2019  . Routine general medical examination at a health care facility 07/29/2019  . Genetic testing 01/02/2018  . Family history of breast cancer   . Family history of genetic disease carrier   . Meningioma ([place]) 07/17/2017  . Fall 01/04/2017  . Vasomotor symptoms due to menopause 06/26/2016  . Atrophic vaginitis 09/01/2015  . Pedal edema 05/27/2014  . Estrogen deficiency 02/21/2013  . Encounter for Medicare annual wellness exam 02/21/2013  . Low back pain potentially associated with radiculopathy  10/11/2012  . ALLERGIC RHINITIS 01/28/2010  . Prediabetes 07/30/2009  . ANXIETY 11/30/2008  . Hyperlipidemia 04/13/2008  . Hypothyroidism 01/07/2008  . Obesity 01/07/2008  . HYPERTENSION, BENIGN 01/07/2008  . OSTEOARTHRITIS, GENERALIZED 01/07/2008    Thank you for your referral.  Joneen Boers PT, DPT   02/24/2020, 2:46 PM  [facility] [address], [place], [address] Phone: [phone]   Fax:  [phone]  Name: Amber Miles MRN: [id number] Date of Birth: 1945/12/31

## 2020-04-16 DIAGNOSIS — H524 Presbyopia: Secondary | ICD-10-CM | POA: Diagnosis not present

## 2020-04-16 DIAGNOSIS — H40053 Ocular hypertension, bilateral: Secondary | ICD-10-CM | POA: Diagnosis not present

## 2020-04-16 DIAGNOSIS — H2513 Age-related nuclear cataract, bilateral: Secondary | ICD-10-CM | POA: Diagnosis not present

## 2020-05-06 DIAGNOSIS — H1045 Other chronic allergic conjunctivitis: Secondary | ICD-10-CM | POA: Diagnosis not present

## 2020-05-06 DIAGNOSIS — J4530 Mild persistent asthma, uncomplicated: Secondary | ICD-10-CM | POA: Diagnosis not present

## 2020-05-06 DIAGNOSIS — J300 Vasomotor rhinitis: Secondary | ICD-10-CM | POA: Diagnosis not present

## 2020-05-19 ENCOUNTER — Ambulatory Visit
Admission: RE | Admit: 2020-05-19 | Discharge: 2020-05-19 | Disposition: A | Discharge status: Home / Self Care | Payer: PPO | Source: Ambulatory Visit | Attending: Family Medicine | Admitting: Family Medicine

## 2020-05-19 ENCOUNTER — Other Ambulatory Visit: Payer: Self-pay

## 2020-05-19 DIAGNOSIS — Z780 Asymptomatic menopausal state: Secondary | ICD-10-CM | POA: Diagnosis not present

## 2020-05-19 DIAGNOSIS — E2839 Other primary ovarian failure: Secondary | ICD-10-CM

## 2020-05-25 ENCOUNTER — Encounter: Payer: Self-pay | Admitting: *Deleted

## 2020-07-16 ENCOUNTER — Telehealth: Payer: Self-pay | Admitting: *Deleted

## 2020-07-16 NOTE — Chronic Care Management (AMB) (Signed)
  Chronic Care Management   Note  07/16/2020 Name: Amber Miles MRN: [id number] DOB: 08-07-1945  Amber Miles is a 75 y.o. year old female who is a primary care patient of Tower, Wynelle Fanny, MD. I reached out to Thompson Caul by phone today in response to a referral sent by Amber Miles PCP Tower, Wynelle Fanny, MD     Amber Miles was given information about Chronic Care Management services today including:  CCM service includes personalized support from designated clinical staff supervised by her physician, including individualized plan of care and coordination with other care providers 24/7 contact phone numbers for assistance for urgent and routine care needs. Service will only be billed when office clinical staff spend 20 minutes or more in a month to coordinate care. Only one practitioner may furnish and bill the service in a calendar month. The patient may stop CCM services at any time (effective at the end of the month) by phone call to the office staff. The patient will be responsible for cost sharing (co-pay) of up to 20% of the service fee (after annual deductible is met).  Patient agreed to services and verbal consent obtained.   Follow up plan: Telephone appointment with care management team member scheduled for:07/22/2020  Amber Miles, [place] [facility]  Direct Dial: [phone]

## 2020-07-22 ENCOUNTER — Ambulatory Visit: Payer: PPO

## 2020-07-22 ENCOUNTER — Ambulatory Visit (INDEPENDENT_AMBULATORY_CARE_PROVIDER_SITE_OTHER): Payer: PPO

## 2020-07-22 DIAGNOSIS — E785 Hyperlipidemia, unspecified: Secondary | ICD-10-CM

## 2020-07-22 DIAGNOSIS — I10 Essential (primary) hypertension: Secondary | ICD-10-CM

## 2020-07-23 NOTE — Patient Instructions (Addendum)
Visit Information: Thank you for taking the time to speak with me today  PATIENT GOALS:   Goals Addressed             This Visit's Progress    Monitor and manage high blood pressure/ high cholesterol   On track    Timeframe:  Long-Range Goal Priority:  Medium Start Date:   07/22/2020                          Expected End Date: 10/15/2020                      Follow Up Date 08/26/2020    - check blood pressure 3 times per week- write blood pressure results in a log or diary - Take your medication as requested.  Call for medicine refill 2 or 3 days before it runs out - Eat a heart healthy/ low salt diet.  Reduce your intake of saturated fat and trans fat (See education information sent to you in the mail on heart healthy / low salt diet) -  Follow up with your doctor as recommended.  - increase your activity with exercise if you are able and ok with your doctor. (See exercise booklet sent to you in the mail)   Why is this important?   You won't feel high blood pressure, but it can still hurt your blood vessels.  High blood pressure can cause heart or kidney problems. It can also cause a stroke.  Making lifestyle changes like losing a little weight or eating less salt will help.  Checking your blood pressure at home and at different times of the day can help to control blood pressure.  If the doctor prescribes medicine remember to take it the way the doctor ordered.  Call the office if you cannot afford the medicine or if there are questions about it.            Consent to CCM Services: Ms. Xie was given information about Chronic Care Management services today including:  CCM service includes personalized support from designated clinical staff supervised by her physician, including individualized plan of care and coordination with other care providers 24/7 contact phone numbers for assistance for urgent and routine care needs. Service will only be billed when office clinical staff  spend 20 minutes or more in a month to coordinate care. Only one practitioner may furnish and bill the service in a calendar month. The patient may stop CCM services at any time (effective at the end of the month) by phone call to the office staff. The patient will be responsible for cost sharing (co-pay) of up to 20% of the service fee (after annual deductible is met).  Patient agreed to services and verbal consent obtained.   The patient verbalized understanding of instructions, educational materials, and care plan provided today and agreed to receive a mailed copy of patient instructions, educational materials, and care plan.   The patient has been provided with contact information for the care management team and has been advised to call with any health related questions or concerns.  The care management team will reach out to the patient again over the next 45 days.   Quinn Plowman RN,BSN,CCM RN Case Manager [facility]  [phone]   CLINICAL CARE PLAN: Patient Care Plan: Cardiovascular     Problem Identified: Knowledge deficit related to long term care plan for management of hypertension/ hyperlipidemia  Priority: Medium     Long-Range Goal: Hypertension / Hyperlipidemia  Monitored   Start Date: 07/22/2020  Expected End Date: 10/15/2020  This Visit's Progress: On track  Priority: Medium  Note:   Objective:  Last practice recorded BP readings:  BP Readings from Last 3 Encounters:  11/12/19 128/76  07/29/19 128/60  07/15/18 130/70  Most recent lipid panel:     Component Value Date/Time   CHOL 163 07/22/2019 0935   TRIG 185.0 (H) 07/22/2019 0935   HDL 42.90 07/22/2019 0935   CHOLHDL 4 07/22/2019 0935   VLDL 37.0 07/22/2019 0935   LDLCALC 83 07/22/2019 0935   LDLDIRECT 105.0 06/15/2015 0930   She states she checks her blood pressure periodically but not often.  Patient reports her blood pressure ranges from 130-140/ 70-80.  Patient states she eats a lot of  fresh vegetables and tries to follow a low salt diet.  Current Barriers:  Knowledge deficit related to long term care plan for self management of hypertension and hyperlipidemia Case Manager Clinical Goal(s):  patient will attend scheduled medical appointments patient will demonstrate ongoing adherence to prescribed treatment plan for hypertension as evidenced by taking all medications as prescribed, monitoring and recording blood pressure as directed, adhering to low sodium/DASH diet patient will verbalize basic understanding of hypertension / hyperlipidemia disease process and self health management plan  Interventions:  Collaboration with Tower, Wynelle Fanny, MD regarding development and update of comprehensive plan of care as evidenced by provider attestation and co-signature Inter-disciplinary care team collaboration (see longitudinal plan of care) Reviewed medications with patient and discussed importance of compliance Discussed plans with patient for ongoing care management follow up and provided patient with direct contact information for care management team Advised patient, providing education and rationale, to monitor blood pressure daily and record, calling PCP for findings outside established parameters.  Reviewed scheduled/upcoming provider appointments.Next follow up appointment with primary care provider for annual wellness visit is 08/10/2020 Information sent to patient on Heart healthy/ low salt diet and exercise  Self-Care Activities: Self administers medications as prescribed Attends all scheduled provider appointments Calls provider office for new concerns, questions, or BP outside discussed parameters Checks BP and records as discussed Follows a low sodium diet/DASH diet Patient Goals: - check blood pressure 3 times per week- write blood pressure results in a log or diary - Take your medication as requested.  Call for medicine refill 2 or 3 days before it runs out - Eat a  heart healthy/ low salt diet.  Reduce your intake of saturated fat and trans fat (See education information sent to you in the mail on heart healthy / low salt diet) -  Follow up with your doctor as recommended.  - increase your activity with exercise if you are able and ok with your doctor. (See exercise booklet sent to you in the mail)  Follow Up Plan: The patient has been provided with contact information for the care management team and has been advised to call with any health related questions or concerns.  The care management team will reach out to the patient again over the next 45 days.

## 2020-07-23 NOTE — Chronic Care Management (AMB) (Signed)
Chronic Care Management   CCM RN Visit Note  07/23/2020 Name: Amber Miles MRN: [id number] DOB: 01-19-1945  Subjective: Amber Miles is a 75 y.o. year old female who is a primary care patient of Tower, Wynelle Fanny, MD. The care management team was consulted for assistance with disease management and care coordination needs.    Engaged with patient by telephone for initial visit in response to provider referral for case management and/or care coordination services.   Consent to Services:  The patient was given the following information about Chronic Care Management services today, agreed to services, and gave verbal consent: 1. CCM service includes personalized support from designated clinical staff supervised by the primary care provider, including individualized plan of care and coordination with other care providers 2. 24/7 contact phone numbers for assistance for urgent and routine care needs. 3. Service will only be billed when office clinical staff spend 20 minutes or more in a month to coordinate care. 4. Only one practitioner may furnish and bill the service in a calendar month. 5.The patient may stop CCM services at any time (effective at the end of the month) by phone call to the office staff. 6. The patient will be responsible for cost sharing (co-pay) of up to 20% of the service fee (after annual deductible is met). Patient agreed to services and consent obtained.  Patient agreed to services and verbal consent obtained.   Assessment: Review of patient past medical history, allergies, medications, health status, including review of consultants reports, laboratory and other test data, was performed as part of comprehensive evaluation and provision of chronic care management services.   SDOH (Social Determinants of Health) assessments and interventions performed:  SDOH Interventions    Flowsheet Row Most Recent Value  SDOH Interventions   Food Insecurity Interventions Intervention Not  Indicated  Housing Interventions Intervention Not Indicated  Transportation Interventions Intervention Not Indicated        CCM Care Plan  Allergies  Allergen Reactions   Neosporin [Neomycin-Polymyxin-Gramicidin] Other (See Comments)    blisters   Tape Swelling    Itching and redness Pt said severe reaction to surgical tape.? latex    Outpatient Encounter Medications as of 07/22/2020  Medication Sig Note   acetaminophen (TYLENOL) 325 MG tablet Take 650 mg by mouth every 6 (six) hours as needed.    acyclovir (ZOVIRAX) 800 MG tablet TAKE 1 TABLET BY MOUTH 3 TIMES DAILY FOR 3 DAYS FLARES 05/27/2014: Received from: External Pharmacy   albuterol (PROAIR HFA) 108 (90 Base) MCG/ACT inhaler USE 2 PUFFS EVERY 4 HOURS AS NEEDED FOR WHEEZING    azelastine (ASTELIN) 0.1 % nasal spray Place 1 spray into both nostrils 2 (two) times daily.    Docusate Calcium (STOOL SOFTENER PO) Take 1 tablet by mouth daily as needed.    esomeprazole (NEXIUM) 40 MG capsule TAKE ONE CAPSULE BY MOUTH EVERY MORNING BEFORE BREAKFAST    estradiol (VIVELLE-DOT) 0.0375 MG/24HR PLACE 1 PATCH ONTO THE SKIN 2 (TWO) TIMES A WEEK.    FLOVENT HFA 110 MCG/ACT inhaler Inhale 1 puff into the lungs in the morning and at bedtime.    Glucosamine-Chondroitin 750-600 MG TABS Take 1 tablet by mouth 2 (two) times daily.      lisinopril (ZESTRIL) 5 MG tablet Take 1 tablet (5 mg total) by mouth daily.    montelukast (SINGULAIR) 10 MG tablet Take 10 mg by mouth daily.     Multiple Vitamin (MULTIVITAMIN) tablet Take 1 tablet by mouth daily.  mupirocin ointment (BACTROBAN) 2 % Apply 1 application topically daily as needed.    potassium chloride SA (KLOR-CON M20) 20 MEQ tablet TAKE 1 TABLET BY MOUTH ONCE DAILY    SYNTHROID 112 MCG tablet Take 112 mcg by mouth daily.    tiZANidine (ZANAFLEX) 4 MG tablet Take 1 tablet (4 mg total) by mouth every 6 (six) hours as needed.    traMADol (ULTRAM) 50 MG tablet TAKE 1-2 TABLETS BY MOUTH EVERY 8 HOURS  AS NEEDED FOR SEVERE(SHOULDER PAIN). CAUTION OF SEDATION    triamterene-hydrochlorothiazide (MAXZIDE-25) 37.5-25 MG tablet Take 2 tablets by mouth daily.    verapamil (CALAN-SR) 240 MG CR tablet TAKE 1 TABLET (240 MG TOTAL) BY MOUTH 2 (TWO) TIMES DAILY.    amoxicillin (AMOXIL) 500 MG capsule Take 500 mg by mouth as directed. 07/22/2020: Patient states this is for dental appointment.    mupirocin ointment (BACTROBAN) 2 % Apply 1 application topically 2 (two) times daily. To affected area/wound (Patient not taking: Reported on 07/22/2020)    No facility-administered encounter medications on file as of 07/22/2020.    Patient Active Problem List   Diagnosis Date Noted   Left-sided thoracic back pain 11/12/2019   Routine general medical examination at a health care facility 07/29/2019   Genetic testing 01/02/2018   Family history of breast cancer    Family history of genetic disease carrier    Meningioma ([place]) 07/17/2017   Fall 01/04/2017   Vasomotor symptoms due to menopause 06/26/2016   Atrophic vaginitis 09/01/2015   Pedal edema 05/27/2014   Estrogen deficiency 02/21/2013   Encounter for Medicare annual wellness exam 02/21/2013   Low back pain potentially associated with radiculopathy 10/11/2012   ALLERGIC RHINITIS 01/28/2010   Prediabetes 07/30/2009   ANXIETY 11/30/2008   Hyperlipidemia 04/13/2008   Hypothyroidism 01/07/2008   Obesity 01/07/2008   HYPERTENSION, BENIGN 01/07/2008   OSTEOARTHRITIS, GENERALIZED 01/07/2008    Conditions to be addressed/monitored:HTN and HLD  Care Plan : Cardiovascular  Updates made by Dannielle Karvonen, RN since 07/23/2020 12:00 AM     Problem: Knowledge deficit related to long term care plan for management of hypertension/ hyperlipidemia   Priority: Medium     Long-Range Goal: Hypertension / Hyperlipidemia  Monitored   Start Date: 07/22/2020  Expected End Date: 10/15/2020  This Visit's Progress: On track  Priority: Medium  Note:   Objective:  Last  practice recorded BP readings:  BP Readings from Last 3 Encounters:  11/12/19 128/76  07/29/19 128/60  07/15/18 130/70  Most recent lipid panel:     Component Value Date/Time   CHOL 163 07/22/2019 0935   TRIG 185.0 (H) 07/22/2019 0935   HDL 42.90 07/22/2019 0935   CHOLHDL 4 07/22/2019 0935   VLDL 37.0 07/22/2019 0935   LDLCALC 83 07/22/2019 0935   LDLDIRECT 105.0 06/15/2015 0930  Patient confirms history of HTN and hyperlipidemia. She states she checks her blood pressure periodically but not often.  Patient reports her blood pressure ranges from 130-140/ 70-80.  Patient states she eats a lot of fresh vegetables and tries to follow a low salt diet.  Current Barriers:  Knowledge deficit related to long term care plan for self management of hypertension and hyperlipidemia Case Manager Clinical Goal(s):  patient will attend scheduled medical appointments patient will demonstrate ongoing adherence to prescribed treatment plan for hypertension as evidenced by taking all medications as prescribed, monitoring and recording blood pressure as directed, adhering to low sodium/DASH diet patient will verbalize basic understanding of hypertension /  hyperlipidemia disease process and self health management plan  Interventions:  Collaboration with Tower, Wynelle Fanny, MD regarding development and update of comprehensive plan of care as evidenced by provider attestation and co-signature Inter-disciplinary care team collaboration (see longitudinal plan of care) Reviewed medications with patient and discussed importance of compliance Discussed plans with patient for ongoing care management follow up and provided patient with direct contact information for care management team Advised patient, providing education and rationale, to monitor blood pressure daily and record, calling PCP for findings outside established parameters.  Reviewed scheduled/upcoming provider appointments. Next follow up appointment with  primary care provider for annual wellness visit is 08/10/2020 Information sent to patient on Heart healthy/ low salt diet and exercise  Self-Care Activities: Self administers medications as prescribed Attends all scheduled provider appointments Calls provider office for new concerns, questions, or BP outside discussed parameters Checks BP and records as discussed Follows a low sodium diet/DASH diet Patient Goals: - check blood pressure at least 1 time per week- write blood pressure results in a log or diary - Take your medication as requested.  Call for medicine refill 2 or 3 days before it runs out - Eat a heart healthy/ low salt diet.  Reduce your intake of saturated fat and trans fat (See education information sent to you in the mail on heart healthy / low salt diet) -  Follow up with your doctor as recommended.  - increase your activity with exercise if you are able and ok with your doctor. (See exercise booklet sent to you in the mail)  Follow Up Plan: The patient has been provided with contact information for the care management team and has been advised to call with any health related questions or concerns.  The care management team will reach out to the patient again over the next 45 days.              Plan:The patient has been provided with contact information for the care management team and has been advised to call with any health related questions or concerns.  and The care management team will reach out to the patient again over the next 45 days. Quinn Plowman RN,BSN,CCM RN Case Manager [place]  [phone]

## 2020-07-31 ENCOUNTER — Other Ambulatory Visit: Payer: Self-pay | Admitting: Family Medicine

## 2020-08-03 ENCOUNTER — Ambulatory Visit (INDEPENDENT_AMBULATORY_CARE_PROVIDER_SITE_OTHER): Payer: PPO

## 2020-08-03 ENCOUNTER — Telehealth: Payer: Self-pay | Admitting: Family Medicine

## 2020-08-03 DIAGNOSIS — R7303 Prediabetes: Secondary | ICD-10-CM

## 2020-08-03 DIAGNOSIS — Z0000 Encounter for general adult medical examination without abnormal findings: Secondary | ICD-10-CM | POA: Diagnosis not present

## 2020-08-03 DIAGNOSIS — I10 Essential (primary) hypertension: Secondary | ICD-10-CM

## 2020-08-03 DIAGNOSIS — E039 Hypothyroidism, unspecified: Secondary | ICD-10-CM

## 2020-08-03 DIAGNOSIS — E785 Hyperlipidemia, unspecified: Secondary | ICD-10-CM

## 2020-08-03 NOTE — Progress Notes (Signed)
Subjective:   Amber Miles is a 75 y.o. female who presents for Medicare Annual (Subsequent) preventive examination.  Review of Systems: N/A     I connected with the patient today by telephone and verified that I am speaking with the correct person using two identifiers. Location patient: home Location nurse: work Persons participating in the telephone visit: patient, nurse.   I discussed the limitations, risks, security and privacy concerns of performing an evaluation and management service by telephone and the availability of in person appointments. I also discussed with the patient that there may be a patient responsible charge related to this service. The patient expressed understanding and verbally consented to this telephonic visit.        Cardiac Risk Factors include: advanced age (>26men, >37 women);hypertension;Other (see comment), Risk factor comments: hyperlipidemia     Objective:    Today's Vitals   08/03/20 1310  PainSc: 0-No pain   There is no height or weight on file to calculate BMI.  Advanced Directives 08/03/2020 07/22/2020 12/23/2019 07/22/2019 07/15/2018 07/02/2017 06/23/2016  Does Patient Have a Medical Advance Directive? Yes Yes Yes Yes Yes Yes Yes  Type of Paramedic of [place];Living will [place];Living will [place];Living will [place];Living will Living will;Healthcare Power of [place];Living will [place];Living will  Does patient want to make changes to medical advance directive? - No - Patient declined No - Patient declined - No - Patient declined - -  Copy of [place] in Chart? No - copy requested - - No - copy requested No - copy requested No - copy requested No - copy requested    Current Medications (verified) Outpatient Encounter Medications as of 08/03/2020  Medication Sig   acetaminophen (TYLENOL)  325 MG tablet Take 650 mg by mouth every 6 (six) hours as needed.   acyclovir (ZOVIRAX) 800 MG tablet TAKE 1 TABLET BY MOUTH 3 TIMES DAILY FOR 3 DAYS FLARES   albuterol (PROAIR HFA) 108 (90 Base) MCG/ACT inhaler USE 2 PUFFS EVERY 4 HOURS AS NEEDED FOR WHEEZING   amoxicillin (AMOXIL) 500 MG capsule Take 500 mg by mouth as directed.   azelastine (ASTELIN) 0.1 % nasal spray Place 1 spray into both nostrils 2 (two) times daily.   Docusate Calcium (STOOL SOFTENER PO) Take 1 tablet by mouth daily as needed.   esomeprazole (NEXIUM) 40 MG capsule TAKE ONE CAPSULE BY MOUTH EVERY MORNING BEFORE BREAKFAST   estradiol (VIVELLE-DOT) 0.0375 MG/24HR PLACE 1 PATCH ONTO THE SKIN 2 (TWO) TIMES A WEEK.   FLOVENT HFA 110 MCG/ACT inhaler Inhale 1 puff into the lungs in the morning and at bedtime.   Glucosamine-Chondroitin 750-600 MG TABS Take 1 tablet by mouth 2 (two) times daily.     lisinopril (ZESTRIL) 5 MG tablet Take 1 tablet (5 mg total) by mouth daily.   montelukast (SINGULAIR) 10 MG tablet Take 10 mg by mouth daily.    Multiple Vitamin (MULTIVITAMIN) tablet Take 1 tablet by mouth daily.     mupirocin ointment (BACTROBAN) 2 % Apply 1 application topically daily as needed.   mupirocin ointment (BACTROBAN) 2 % Apply 1 application topically 2 (two) times daily. To affected area/wound   potassium chloride SA (KLOR-CON M20) 20 MEQ tablet TAKE 1 TABLET BY MOUTH ONCE DAILY   SYNTHROID 112 MCG tablet Take 112 mcg by mouth daily.   tiZANidine (ZANAFLEX) 4 MG tablet Take 1 tablet (  4 mg total) by mouth every 6 (six) hours as needed.   traMADol (ULTRAM) 50 MG tablet TAKE 1-2 TABLETS BY MOUTH EVERY 8 HOURS AS NEEDED FOR SEVERE(SHOULDER PAIN). CAUTION OF SEDATION   triamterene-hydrochlorothiazide (MAXZIDE-25) 37.5-25 MG tablet TAKE 2 TABLETS BY MOUTH EVERY DAY   verapamil (CALAN-SR) 240 MG CR tablet TAKE 1 TABLET (240 MG TOTAL) BY MOUTH 2 (TWO) TIMES DAILY.   No facility-administered encounter medications on file as of  08/03/2020.    Allergies (verified) Neosporin [neomycin-polymyxin-gramicidin] and Tape   History: Past Medical History:  Diagnosis Date   Allergy    allergic rhinitis   Anxiety    Arthritis    OA knee replacement   Cataract    Degenerative disc disease, thoracic    Family history of breast cancer    Family history of genetic disease carrier    paternal cousin has NBN mutation   GERD (gastroesophageal reflux disease)    Hyperlipidemia    Hypertension    Hypothyroid    Reactive airways dysfunction syndrome (HCC)    Skin lesions, generalized    squamous cell skin lesions and basal cell skin lesions   Past Surgical History:  Procedure Laterality Date   ABDOMINAL HYSTERECTOMY  1990s   hyst with oophrectomy for B9lesion/cyst and prolapse   BREAST BIOPSY Bilateral    benign   JOINT REPLACEMENT  05/2009   left total knee replacement   recocele     and cystocele repair   TONSILLECTOMY     tsa     Family History  Problem Relation Age of Onset   Hypertension Mother    Stroke Mother    Depression Mother    Alzheimer's disease Mother    Heart disease Father    Cerebral palsy Father    Hypertension Sister    Breast cancer Cousin 41       NBN mutation   Melanoma Maternal Uncle        dx >50   Social History   Socioeconomic History   Marital status: Widowed    Spouse name: Not on file   Number of children: Not on file   Years of education: Not on file   Highest education level: Not on file  Occupational History   Not on file  Tobacco Use   Smoking status: Never   Smokeless tobacco: Never  Vaping Use   Vaping Use: Never used  Substance and Sexual Activity   Alcohol use: No    Alcohol/week: 0.0 standard drinks   Drug use: No   Sexual activity: Not Currently  Other Topics Concern   Not on file  Social History Narrative   Not on file   Social Determinants of Health   Financial Resource Strain: Low Risk    Difficulty of Paying Living Expenses: Not hard at  all  Food Insecurity: No Food Insecurity   Worried About Charity fundraiser in the Last Year: Never true   [place] in the Last Year: Never true  Transportation Needs: No Transportation Needs   Lack of Transportation (Medical): No   Lack of Transportation (Non-Medical): No  Physical Activity: Inactive   Days of Exercise per Week: 0 days   Minutes of Exercise per Session: 0 min  Stress: No Stress Concern Present   Feeling of Stress : Not at all  Social Connections: Not on file    Tobacco Counseling Counseling given: Not Answered   Clinical Intake:  Pre-visit preparation completed: Yes  Pain : No/denies pain Pain Score: 0-No pain     Nutritional Risks: None Diabetes: No  How often do you need to have someone help you when you read instructions, pamphlets, or other written materials from your doctor or pharmacy?: 1 - Never  Diabetic: No Nutrition Risk Assessment:  Has the patient had any N/V/D within the last 2 months?  No  Does the patient have any non-healing wounds?  No  Has the patient had any unintentional weight loss or weight gain?  No   Diabetes:  Is the patient diabetic?  No  If diabetic, was a CBG obtained today?   N/A Did the patient bring in their glucometer from home?   N/A How often do you monitor your CBG's? N/A.   Financial Strains and Diabetes Management:  Are you having any financial strains with the device, your supplies or your medication?  N/A .  Does the patient want to be seen by Chronic Care Management for management of their diabetes?   N/A Would the patient like to be referred to a Nutritionist or for Diabetic Management?   N/A    Interpreter Needed?: No  Information entered by :: CJohnson, RN   Activities of Daily Living In your present state of health, do you have any difficulty performing the following activities: 08/03/2020  Hearing? N  Vision? N  Difficulty concentrating or making decisions? N  Walking or climbing  stairs? N  Dressing or bathing? N  Doing errands, shopping? N  Preparing Food and eating ? N  Using the Toilet? N  In the past six months, have you accidently leaked urine? N  Do you have problems with loss of bowel control? N  Managing your Medications? N  Managing your Finances? N  Housekeeping or managing your Housekeeping? N  Some recent data might be hidden    Patient Care Team: Tower, Wynelle Fanny, MD as PCP - Silvano Rusk, MD as Consulting Physician (Ophthalmology) Dannielle Karvonen, RN as Case Manager  Indicate any recent Medical Services you may have received from other than [facility] providers in the past year (date may be approximate).     Assessment:   This is a routine wellness examination for Amber Miles.  Hearing/Vision screen Vision Screening - Comments:: Patient gets annual eye exams   Dietary issues and exercise activities discussed: Current Exercise Habits: The patient does not participate in regular exercise at present, Exercise limited by: None identified   Goals Addressed             This Visit's Progress    Patient Stated       08/03/2020, I will maintain and continue medications as prescribed.       Depression Screen PHQ 2/9 Scores 08/03/2020 07/22/2020 07/22/2019 07/15/2018 07/02/2017 06/23/2016 06/22/2015  PHQ - 2 Score 0 0 0 0 0 0 0  PHQ- 9 Score 0 - 0 0 0 - -    Fall Risk Fall Risk  08/03/2020 07/22/2020 07/22/2019 07/15/2018 07/02/2017  Falls in the past year? 1 1 0 0 Yes  Comment - - - - lost balance due to foot being numb; multiple bruises  Number falls in past yr: 0 0 0 - 1  Injury with Fall? 0 0 0 - Yes  Risk for fall due to : Medication side effect History of fall(s) Medication side effect - -  Follow up Falls evaluation completed;Falls prevention discussed Falls prevention discussed Falls evaluation completed;Falls prevention discussed - -    FALL RISK  PREVENTION PERTAINING TO THE HOME:  Any stairs in or around the home? Yes  If so, are there  any without handrails? No  Home free of loose throw rugs in walkways, pet beds, electrical cords, etc? Yes  Adequate lighting in your home to reduce risk of falls? Yes   ASSISTIVE DEVICES UTILIZED TO PREVENT FALLS:  Life alert? No  Use of a cane, walker or w/c? No  Grab bars in the bathroom? No  Shower chair or bench in shower? No  Elevated toilet seat or a handicapped toilet? No   TIMED UP AND GO:  Was the test performed?  N/A telephone visit .    Cognitive Function: MMSE - Mini Mental State Exam 08/03/2020 07/22/2019 07/15/2018 07/02/2017 06/23/2016  Orientation to time 5 5 5 5 5   Orientation to Place 5 5 5 5 5   Registration 3 3 3 3 3   Attention/ Calculation 5 5 0 0 0  Recall 3 3 3 3 3   Language- name 2 objects - - 0 0 0  Language- repeat 1 1 1 1 1   Language- follow 3 step command - - 0 3 3  Language- read & follow direction - - 0 0 0  Write a sentence - - 0 0 0  Copy design - - 0 0 0  Total score - - 17 20 20   Mini Cog  Mini-Cog screen was completed. Maximum score is 22. A value of 0 denotes this part of the MMSE was not completed or the patient failed this part of the Mini-Cog screening.       Immunizations Immunization History  Administered Date(s) Administered   Fluad Quad(high Dose 65+) 10/09/2019   Influenza Split 10/18/2010   Influenza Whole 11/30/2006, 10/28/2008, 10/27/2009   Influenza, High Dose Seasonal PF 11/11/2015, 10/05/2016, 10/18/2017, 11/11/2018   Influenza-Unspecified 10/07/2012, 08/30/2013, 10/01/2014   PFIZER Comirnaty(Gray Top)Covid-19 Tri-Sucrose Vaccine 06/09/2020   PFIZER(Purple Top)SARS-COV-2 Vaccination 02/04/2019, 02/24/2019   Pneumococcal Conjugate-13 05/27/2014   Pneumococcal Polysaccharide-23 12/04/2011, 10/07/2015, 10/05/2016, 10/04/2017, 05/15/2019, 05/06/2020   Td 12/04/2011   Zoster Recombinat (Shingrix) 08/06/2019, 10/09/2019   Zoster, Live 10/05/2010    TDAP status: Up to date  Flu Vaccine status: Up to date  Pneumococcal  vaccine status: Up to date  Covid-19 vaccine status: Completed vaccines  Qualifies for Shingles Vaccine? Yes   Zostavax completed Yes   Shingrix Completed?: Yes  Screening Tests Health Maintenance  Topic Date Due   COLONOSCOPY (Pts 45-29yrs Insurance coverage will need to be confirmed)  08/02/2020   INFLUENZA VACCINE  08/16/2020   COVID-19 Vaccine (4 - Booster for [place] series) 10/10/2020   MAMMOGRAM  11/30/2020   TETANUS/TDAP  12/03/2021   DEXA SCAN  Completed   Hepatitis C Screening  Completed   PNA vac Low Risk Adult  Completed   Zoster Vaccines- Shingrix  Completed   HPV VACCINES  Aged Out    Health Maintenance  Health Maintenance Due  Topic Date Due   COLONOSCOPY (Pts 45-75yrs Insurance coverage will need to be confirmed)  08/02/2020    Colorectal cancer screening: due, will discuss with provider   Mammogram status: Completed 12/01/2019. Repeat every year  Bone Density status: Completed 05/19/2020. Results reflect: Bone density results: NORMAL. Repeat every 5 years.  Lung Cancer Screening: (Low Dose CT Chest recommended if Age 52-80 years, 30 pack-year currently smoking OR have quit w/in 15years.) does not qualify.   Additional Screening:  Hepatitis C Screening: does qualify; Completed 06/22/2015  Vision Screening: Recommended annual ophthalmology  exams for early detection of glaucoma and other disorders of the eye. Is the patient up to date with their annual eye exam?  Yes  Who is the provider or what is the name of the office in which the patient attends annual eye exams? Dr. Valetta Close If pt is not established with a provider, would they like to be referred to a provider to establish care? No .   Dental Screening: Recommended annual dental exams for proper oral hygiene  Community Resource Referral / Chronic Care Management: CRR required this visit?  No   CCM required this visit?  No      Plan:     I have personally reviewed and noted the following in the  patient's chart:   Medical and social history Use of alcohol, tobacco or illicit drugs  Current medications and supplements including opioid prescriptions.  Functional ability and status Nutritional status Physical activity Advanced directives List of other physicians Hospitalizations, surgeries, and ER visits in previous 12 months Vitals Screenings to include cognitive, depression, and falls Referrals and appointments  In addition, I have reviewed and discussed with patient certain preventive protocols, quality metrics, and best practice recommendations. A written personalized care plan for preventive services as well as general preventive health recommendations were provided to patient.   Due to this being a telephonic visit, the after visit summary with patients personalized plan was offered to patient via office or my-chart. Patient preferred to pick up at office at next visit or via mychart.   Andrez Grime, LPN   7/67/0110

## 2020-08-03 NOTE — Progress Notes (Signed)
PCP notes:  Health Maintenance: Colonoscopy- due   Abnormal Screenings: none   Patient concerns: none   Nurse concerns: none   Next PCP appt.: 08/10/2020 @ 3 pm

## 2020-08-03 NOTE — Telephone Encounter (Signed)
-----   Message from Cloyd Stagers, RT sent at 07/22/2020  4:59 PM EDT ----- Regarding: Lab Orders for Wednesday 7.20.2022 Please place lab orders for Wednesday 7.20.2022, office visit for physical on Tuesday 7.26.2022 Thank you, Dyke Maes RT(R)

## 2020-08-03 NOTE — Patient Instructions (Signed)
Amber Miles , Thank you for taking time to come for your Medicare Wellness Visit. I appreciate your ongoing commitment to your health goals. Please review the following plan we discussed and let me know if I can assist you in the future.   Screening recommendations/referrals: Colonoscopy: due, will discuss with provider  Mammogram: Up to date, completed 12/01/2019, due 11/2020 Bone Density: Up to date, completed 05/19/2020, due in 5 years  Recommended yearly ophthalmology/optometry visit for glaucoma screening and checkup Recommended yearly dental visit for hygiene and checkup  Vaccinations: Influenza vaccine: Up to date, completed 10/09/2019, due 08/2020 Pneumococcal vaccine: Completed series Tdap vaccine: Up to date, completed 12/04/2011, due 11/2021 Shingles vaccine: Completed series   Covid-19:Completed series  Advanced directives: Please bring a copy of your POA (Power of [place]) and/or Living Will to your next appointment.   Conditions/risks identified: hypertension, hyperlipidemia  Next appointment: Follow up in one year for your annual wellness visit    Preventive Care 69 Years and Older, Female Preventive care refers to lifestyle choices and visits with your health care provider that can promote health and wellness. What does preventive care include? A yearly physical exam. This is also called an annual well check. Dental exams once or twice a year. Routine eye exams. Ask your health care provider how often you should have your eyes checked. Personal lifestyle choices, including: Daily care of your teeth and gums. Regular physical activity. Eating a healthy diet. Avoiding tobacco and drug use. Limiting alcohol use. Practicing safe sex. Taking low-dose aspirin every day. Taking vitamin and mineral supplements as recommended by your health care provider. What happens during an annual well check? The services and screenings done by your health care provider during your annual  well check will depend on your age, overall health, lifestyle risk factors, and family history of disease. Counseling  Your health care provider may ask you questions about your: Alcohol use. Tobacco use. Drug use. Emotional well-being. Home and relationship well-being. Sexual activity. Eating habits. History of falls. Memory and ability to understand (cognition). Work and work Statistician. Reproductive health. Screening  You may have the following tests or measurements: Height, weight, and BMI. Blood pressure. Lipid and cholesterol levels. These may be checked every 5 years, or more frequently if you are over 66 years old. Skin check. Lung cancer screening. You may have this screening every year starting at age 71 if you have a 30-pack-year history of smoking and currently smoke or have quit within the past 15 years. Fecal occult blood test (FOBT) of the stool. You may have this test every year starting at age 14. Flexible sigmoidoscopy or colonoscopy. You may have a sigmoidoscopy every 5 years or a colonoscopy every 10 years starting at age 65. Hepatitis C blood test. Hepatitis B blood test. Sexually transmitted disease (STD) testing. Diabetes screening. This is done by checking your blood sugar (glucose) after you have not eaten for a while (fasting). You may have this done every 1-3 years. Bone density scan. This is done to screen for osteoporosis. You may have this done starting at age 58. Mammogram. This may be done every 1-2 years. Talk to your health care provider about how often you should have regular mammograms. Talk with your health care provider about your test results, treatment options, and if necessary, the need for more tests. Vaccines  Your health care provider may recommend certain vaccines, such as: Influenza vaccine. This is recommended every year. Tetanus, diphtheria, and acellular pertussis (Tdap, Td) vaccine.  You may need a Td booster every 10 years. Zoster  vaccine. You may need this after age 16. Pneumococcal 13-valent conjugate (PCV13) vaccine. One dose is recommended after age 84. Pneumococcal polysaccharide (PPSV23) vaccine. One dose is recommended after age 9. Talk to your health care provider about which screenings and vaccines you need and how often you need them. This information is not intended to replace advice given to you by your health care provider. Make sure you discuss any questions you have with your health care provider. Document Released: 01/29/2015 Document Revised: 09/22/2015 Document Reviewed: 11/03/2014 Elsevier Interactive Patient Education  2017 [employer] Prevention in the Home Falls can cause injuries. They can happen to people of all ages. There are many things you can do to make your home safe and to help prevent falls. What can I do on the outside of my home? Regularly fix the edges of walkways and driveways and fix any cracks. Remove anything that might make you trip as you walk through a door, such as a raised step or threshold. Trim any bushes or trees on the path to your home. Use bright outdoor lighting. Clear any walking paths of anything that might make someone trip, such as rocks or tools. Regularly check to see if handrails are loose or broken. Make sure that both sides of any steps have handrails. Any raised decks and porches should have guardrails on the edges. Have any leaves, snow, or ice cleared regularly. Use sand or salt on walking paths during winter. Clean up any spills in your garage right away. This includes oil or grease spills. What can I do in the bathroom? Use night lights. Install grab bars by the toilet and in the tub and shower. Do not use towel bars as grab bars. Use non-skid mats or decals in the tub or shower. If you need to sit down in the shower, use a plastic, non-slip stool. Keep the floor dry. Clean up any water that spills on the floor as soon as it happens. Remove  soap buildup in the tub or shower regularly. Attach bath mats securely with double-sided non-slip rug tape. Do not have throw rugs and other things on the floor that can make you trip. What can I do in the bedroom? Use night lights. Make sure that you have a light by your bed that is easy to reach. Do not use any sheets or blankets that are too big for your bed. They should not hang down onto the floor. Have a firm chair that has side arms. You can use this for support while you get dressed. Do not have throw rugs and other things on the floor that can make you trip. What can I do in the kitchen? Clean up any spills right away. Avoid walking on wet floors. Keep items that you use a lot in easy-to-reach places. If you need to reach something above you, use a strong step stool that has a grab bar. Keep electrical cords out of the way. Do not use floor polish or wax that makes floors slippery. If you must use wax, use non-skid floor wax. Do not have throw rugs and other things on the floor that can make you trip. What can I do with my stairs? Do not leave any items on the stairs. Make sure that there are handrails on both sides of the stairs and use them. Fix handrails that are broken or loose. Make sure that handrails are as long as  the stairways. Check any carpeting to make sure that it is firmly attached to the stairs. Fix any carpet that is loose or worn. Avoid having throw rugs at the top or bottom of the stairs. If you do have throw rugs, attach them to the floor with carpet tape. Make sure that you have a light switch at the top of the stairs and the bottom of the stairs. If you do not have them, ask someone to add them for you. What else can I do to help prevent falls? Wear shoes that: Do not have high heels. Have rubber bottoms. Are comfortable and fit you well. Are closed at the toe. Do not wear sandals. If you use a stepladder: Make sure that it is fully opened. Do not climb a  closed stepladder. Make sure that both sides of the stepladder are locked into place. Ask someone to hold it for you, if possible. Clearly mark and make sure that you can see: Any grab bars or handrails. First and last steps. Where the edge of each step is. Use tools that help you move around (mobility aids) if they are needed. These include: Canes. Walkers. Scooters. Crutches. Turn on the lights when you go into a dark area. Replace any light bulbs as soon as they burn out. Set up your furniture so you have a clear path. Avoid moving your furniture around. If any of your floors are uneven, fix them. If there are any pets around you, be aware of where they are. Review your medicines with your doctor. Some medicines can make you feel dizzy. This can increase your chance of falling. Ask your doctor what other things that you can do to help prevent falls. This information is not intended to replace advice given to you by your health care provider. Make sure you discuss any questions you have with your health care provider. Document Released: 10/29/2008 Document Revised: 06/10/2015 Document Reviewed: 02/06/2014 Elsevier Interactive Patient Education  2017 [employer].

## 2020-08-04 ENCOUNTER — Other Ambulatory Visit (INDEPENDENT_AMBULATORY_CARE_PROVIDER_SITE_OTHER): Payer: PPO

## 2020-08-04 ENCOUNTER — Other Ambulatory Visit: Payer: Self-pay

## 2020-08-04 ENCOUNTER — Other Ambulatory Visit: Payer: Self-pay | Admitting: Family Medicine

## 2020-08-04 DIAGNOSIS — E039 Hypothyroidism, unspecified: Secondary | ICD-10-CM

## 2020-08-04 DIAGNOSIS — I10 Essential (primary) hypertension: Secondary | ICD-10-CM

## 2020-08-04 DIAGNOSIS — R7303 Prediabetes: Secondary | ICD-10-CM

## 2020-08-04 DIAGNOSIS — E785 Hyperlipidemia, unspecified: Secondary | ICD-10-CM | POA: Diagnosis not present

## 2020-08-04 LAB — CBC WITH DIFFERENTIAL/PLATELET
Basophils Absolute: 0.1 10*3/uL (ref 0.0–0.1)
Basophils Relative: 1.1 % (ref 0.0–3.0)
Eosinophils Absolute: 0.2 10*3/uL (ref 0.0–0.7)
Eosinophils Relative: 3.1 % (ref 0.0–5.0)
HCT: 46.5 % — ABNORMAL HIGH (ref 36.0–46.0)
Hemoglobin: 15.8 g/dL — ABNORMAL HIGH (ref 12.0–15.0)
Lymphocytes Relative: 43.1 % (ref 12.0–46.0)
Lymphs Abs: 2.2 10*3/uL (ref 0.7–4.0)
MCHC: 33.9 g/dL (ref 30.0–36.0)
MCV: 90.2 fl (ref 78.0–100.0)
Monocytes Absolute: 0.5 10*3/uL (ref 0.1–1.0)
Monocytes Relative: 10.4 % (ref 3.0–12.0)
Neutro Abs: 2.2 10*3/uL (ref 1.4–7.7)
Neutrophils Relative %: 42.3 % — ABNORMAL LOW (ref 43.0–77.0)
Platelets: 296 10*3/uL (ref 150.0–400.0)
RBC: 5.16 Mil/uL — ABNORMAL HIGH (ref 3.87–5.11)
RDW: 15.1 % (ref 11.5–15.5)
WBC: 5.1 10*3/uL (ref 4.0–10.5)

## 2020-08-04 LAB — LIPID PANEL
Cholesterol: 186 mg/dL (ref 0–200)
HDL: 46.6 mg/dL (ref >39.00)
NonHDL: 139.12
Total CHOL/HDL Ratio: 4
Triglycerides: 255 mg/dL — ABNORMAL HIGH (ref 0.0–149.0)
VLDL: 51 mg/dL — ABNORMAL HIGH (ref 0.0–40.0)

## 2020-08-04 LAB — COMPREHENSIVE METABOLIC PANEL
ALT: 19 U/L (ref 0–35)
AST: 19 U/L (ref 0–37)
Albumin: 4.2 g/dL (ref 3.5–5.2)
Alkaline Phosphatase: 69 U/L (ref 39–117)
BUN: 22 mg/dL (ref 6–23)
CO2: 25 mEq/L (ref 19–32)
Calcium: 10 mg/dL (ref 8.4–10.5)
Chloride: 99 mEq/L (ref 96–112)
Creatinine, Ser: 1.11 mg/dL (ref 0.40–1.20)
GFR: 48.6 mL/min — ABNORMAL LOW (ref >60.00)
Glucose, Bld: 102 mg/dL — ABNORMAL HIGH (ref 70–99)
Potassium: 3.9 mEq/L (ref 3.5–5.1)
Sodium: 136 mEq/L (ref 135–145)
Total Bilirubin: 0.5 mg/dL (ref 0.2–1.2)
Total Protein: 7.1 g/dL (ref 6.0–8.3)

## 2020-08-04 LAB — TSH: TSH: 9.07 u[IU]/mL — ABNORMAL HIGH (ref 0.35–5.50)

## 2020-08-04 LAB — HEMOGLOBIN A1C: Hgb A1c MFr Bld: 6 % (ref 4.6–6.5)

## 2020-08-04 LAB — LDL CHOLESTEROL, DIRECT: Direct LDL: 114 mg/dL

## 2020-08-05 NOTE — Telephone Encounter (Signed)
LOV 11/12/19 for acute visit. Next appointment on 08/10/20 CPE  Last refilled: Tramadol 09/23/19 #30 with 0 refill           Klor Con 07/29/19 # 90 with 3 refills

## 2020-08-10 ENCOUNTER — Encounter: Payer: Self-pay | Admitting: Family Medicine

## 2020-08-10 ENCOUNTER — Other Ambulatory Visit: Payer: Self-pay

## 2020-08-10 ENCOUNTER — Ambulatory Visit (INDEPENDENT_AMBULATORY_CARE_PROVIDER_SITE_OTHER): Payer: PPO | Admitting: Family Medicine

## 2020-08-10 VITALS — BP 128/64 | HR 67 | Temp 97.8°F | Ht 67.5 in | Wt 245.4 lb

## 2020-08-10 DIAGNOSIS — R7303 Prediabetes: Secondary | ICD-10-CM

## 2020-08-10 DIAGNOSIS — E039 Hypothyroidism, unspecified: Secondary | ICD-10-CM

## 2020-08-10 DIAGNOSIS — D329 Benign neoplasm of meninges, unspecified: Secondary | ICD-10-CM | POA: Diagnosis not present

## 2020-08-10 DIAGNOSIS — E785 Hyperlipidemia, unspecified: Secondary | ICD-10-CM | POA: Diagnosis not present

## 2020-08-10 DIAGNOSIS — Z0000 Encounter for general adult medical examination without abnormal findings: Secondary | ICD-10-CM | POA: Diagnosis not present

## 2020-08-10 DIAGNOSIS — I10 Essential (primary) hypertension: Secondary | ICD-10-CM

## 2020-08-10 DIAGNOSIS — N951 Menopausal and female climacteric states: Secondary | ICD-10-CM

## 2020-08-10 NOTE — Patient Instructions (Addendum)
You can try tizanidine occasionally 2 pills instead of one if really needed (caution of falls)   Call Dr [facility] office to schedule your colonoscopy   Chair yoga is great for balance and strength  There are lots of videos   In addition to the one a day vitamin take another 1000 iu of vitamin D3 daily  This is for bone health  Make sure your multi vitamin does not have iron   To prevent diabetes Try to get most of your carbohydrates from produce (with the exception of white potatoes)  Eat less bread/pasta/rice/snack foods/cereals/sweets and other items from the middle of the grocery store (processed carbs)  Think about transitioning to vaginal estrogen  Small amount of estrogen cream - vaginal twice weekly  Let me know when you are ready to transition

## 2020-08-10 NOTE — Progress Notes (Signed)
Subjective:    Patient ID: Amber Miles, female    DOB: 05/27/1945, 75 y.o.   MRN: [id number]  This visit occurred during the SARS-CoV-2 public health emergency.  Safety protocols were in place, including screening questions prior to the visit, additional usage of staff PPE, and extensive cleaning of exam room while observing appropriate contact time as indicated for disinfecting solutions.   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  Needs more muscle relaxer occ Tizanidine  Very busy  Also cooking    Colonoscopy 7/17 with 5 y recall -will get that scheduled  She usually gets a call to schedule   Mammogram 11/21  Self breast exam- no lumps  Uses vivelle dot   Dexa 5/22-in the nl range [place] - one tripped on root in the yard (more careful also)  Fractures -none    HTN bp is stable today  No cp or palpitations or headaches or edema  No side effects to medicines  BP Readings from Last 3 Encounters:  08/10/20 128/64  11/12/19 128/76  07/29/19 128/60    Plan to continue lisinopril 5 mg daily  triam-hct 37.5-25 mg daily  Verapamil sr 240 mg daily Pulse Readings from Last 3 Encounters:  08/10/20 67  11/12/19 64  07/29/19 74    Hypothyroidism  Pt has no clinical changes No change in energy level/ hair or skin/ edema and no tremor Lab Results  Component Value Date   TSH 9.07 (H) 08/04/2020    Levothyroxine 112 mcg  Sees Dr Jerilynn Mages -has appt soon    Hyperlipidemia Lab Results  Component Value Date   CHOL 186 08/04/2020   CHOL 163 07/22/2019   CHOL 167 07/08/2018   Lab Results  Component Value Date   HDL 46.60 08/04/2020   HDL 42.90 07/22/2019   HDL 43.00 07/08/2018   Lab Results  Component Value Date   LDLCALC 83 07/22/2019    LDLCALC 98 07/08/2018   LDLCALC 101 (H) 07/02/2017   Lab Results  Component Value Date   TRIG 255.0 (H) 08/04/2020   TRIG 185.0 (H) 07/22/2019   TRIG 130.0 07/08/2018   Lab Results  Component Value Date   CHOLHDL 4 08/04/2020   CHOLHDL 4 07/22/2019   CHOLHDL 4 07/08/2018   Lab Results  Component Value Date   LDLDIRECT 114.0 08/04/2020   LDLDIRECT 105.0 06/15/2015  Diet controlled  LDL down  Trig up - at times eats sugar but usually avoids   Prediabetes Lab Results  Component Value Date   HGBA1C 6.0 08/04/2020  This is stable     Other labs Lab Results  Component Value Date   WBC 5.1 08/04/2020   HGB 15.8 (H) 08/04/2020   HCT 46.5 (H) 08/04/2020   MCV 90.2 08/04/2020   PLT 296.0 08/04/2020   Lab Results  Component Value Date   CREATININE 1.11 08/04/2020   BUN 22 08/04/2020   NA 136 08/04/2020   K 3.9 08/04/2020   CL 99 08/04/2020   CO2 25 08/04/2020   Lab Results  Component Value Date   ALT 19 08/04/2020   AST 19 08/04/2020   ALKPHOS 69 08/04/2020   BILITOT 0.5 08/04/2020  Patient Active Problem List   Diagnosis Date Noted   Routine general medical examination at a health care facility 07/29/2019   Genetic testing 01/02/2018   Family history of breast cancer    Family history of genetic disease carrier    Meningioma ([place]) 07/17/2017   Fall 01/04/2017   Vasomotor symptoms due to menopause 06/26/2016   Atrophic vaginitis 09/01/2015   Pedal edema 05/27/2014   Estrogen deficiency 02/21/2013   Encounter for Medicare annual wellness exam 02/21/2013   Low back pain potentially associated with radiculopathy 10/11/2012   ALLERGIC RHINITIS 01/28/2010   Prediabetes 07/30/2009   ANXIETY 11/30/2008   Hyperlipidemia 04/13/2008   Hypothyroidism 01/07/2008   HYPERTENSION, BENIGN 01/07/2008   OSTEOARTHRITIS, GENERALIZED 01/07/2008   Past Medical History:  Diagnosis Date   Allergy    allergic rhinitis   Anxiety    Arthritis    OA knee replacement    Cataract    Degenerative disc disease, thoracic    Family history of breast cancer    Family history of genetic disease carrier    paternal cousin has NBN mutation   GERD (gastroesophageal reflux disease)    Hyperlipidemia    Hypertension    Hypothyroid    Reactive airways dysfunction syndrome (HCC)    Skin lesions, generalized    squamous cell skin lesions and basal cell skin lesions   Past Surgical History:  Procedure Laterality Date   ABDOMINAL HYSTERECTOMY  1990s   hyst with oophrectomy for B9lesion/cyst and prolapse   BREAST BIOPSY Bilateral    benign   JOINT REPLACEMENT  05/2009   left total knee replacement   recocele     and cystocele repair   TONSILLECTOMY     tsa     Social History   Tobacco Use   Smoking status: Never   Smokeless tobacco: Never  Vaping Use   Vaping Use: Never used  Substance Use Topics   Alcohol use: No    Alcohol/week: 0.0 standard drinks   Drug use: No   Family History  Problem Relation Age of Onset   Hypertension Mother    Stroke Mother    Depression Mother    Alzheimer's disease Mother    Heart disease Father    Cerebral palsy Father    Hypertension Sister    Breast cancer Cousin 46       NBN mutation   Melanoma Maternal Uncle        dx >50   Allergies  Allergen Reactions   Neosporin [Neomycin-Polymyxin-Gramicidin] Other (See Comments)    blisters   Tape Swelling    Itching and redness Pt said severe reaction to surgical tape.? latex   Current Outpatient Medications on File Prior to Visit  Medication Sig Dispense Refill   acetaminophen (TYLENOL) 325 MG tablet Take 650 mg by mouth every 6 (six) hours as needed.     acyclovir (ZOVIRAX) 800 MG tablet TAKE 1 TABLET BY MOUTH 3 TIMES DAILY FOR 3 DAYS FLARES  2   albuterol (PROAIR HFA) 108 (90 Base) MCG/ACT inhaler USE 2 PUFFS EVERY 4 HOURS AS NEEDED FOR WHEEZING 18 g 3   amoxicillin (AMOXIL) 500 MG capsule Take 500 mg by mouth as directed.     azelastine (ASTELIN) 0.1 %  nasal spray Place 1 spray into both nostrils 2 (two) times daily.     Docusate Calcium (STOOL SOFTENER PO) Take 1 tablet by mouth daily as needed.     esomeprazole (NEXIUM) 40 MG capsule TAKE 1  CAPSULE BY MOUTH EVERY DAY BEFORE BREAKFAST 90 capsule 0   estradiol (VIVELLE-DOT) 0.0375 MG/24HR PLACE 1 PATCH ONTO THE SKIN 2 (TWO) TIMES A WEEK. 24 patch 1   FLOVENT HFA 110 MCG/ACT inhaler Inhale 2 puffs into the lungs in the morning and at bedtime.     Glucosamine-Chondroitin 750-600 MG TABS Take 1 tablet by mouth 2 (two) times daily.       lisinopril (ZESTRIL) 5 MG tablet TAKE 1 TABLET BY MOUTH EVERY DAY 90 tablet 0   montelukast (SINGULAIR) 10 MG tablet Take 10 mg by mouth daily.      Multiple Vitamin (MULTIVITAMIN) tablet Take 1 tablet by mouth daily.       mupirocin ointment (BACTROBAN) 2 % Apply 1 application topically 2 (two) times daily. To affected area/wound 22 g 1   potassium chloride SA (KLOR-CON M20) 20 MEQ tablet TAKE 1 TABLET BY MOUTH EVERY DAY 90 tablet 3   SYNTHROID 112 MCG tablet Take 112 mcg by mouth daily.  11   tiZANidine (ZANAFLEX) 4 MG tablet Take 1 tablet (4 mg total) by mouth every 6 (six) hours as needed. 120 tablet 3   traMADol (ULTRAM) 50 MG tablet TAKE 1-2 TABLETS BY MOUTH EVERY 8 HOURS AS NEEDED FOR SEVERE(SHOULDER PAIN). CAUTION OF SEDATION 30 tablet 0   triamterene-hydrochlorothiazide (MAXZIDE-25) 37.5-25 MG tablet TAKE 2 TABLETS BY MOUTH EVERY DAY 180 tablet 0   verapamil (CALAN-SR) 240 MG CR tablet TAKE 1 TABLET (240 MG TOTAL) BY MOUTH 2 (TWO) TIMES DAILY. 180 tablet 0   No current facility-administered medications on file prior to visit.    Review of Systems  Constitutional:  Negative for activity change, appetite change, fatigue, fever and unexpected weight change.  HENT:  Negative for congestion, ear pain, rhinorrhea, sinus pressure and sore throat.   Eyes:  Negative for pain, redness and visual disturbance.  Respiratory:  Negative for cough, shortness of breath  and wheezing.   Cardiovascular:  Negative for chest pain and palpitations.  Gastrointestinal:  Negative for abdominal pain, blood in stool, constipation and diarrhea.  Endocrine: Negative for polydipsia and polyuria.  Genitourinary:  Negative for dysuria, frequency and urgency.  Musculoskeletal:  Positive for arthralgias and back pain. Negative for myalgias.  Skin:  Negative for pallor and rash.  Allergic/Immunologic: Negative for environmental allergies.  Neurological:  Negative for dizziness, syncope and headaches.  Hematological:  Negative for adenopathy. Does not bruise/bleed easily.  Psychiatric/Behavioral:  Negative for decreased concentration and dysphoric mood. The patient is not nervous/anxious.       Objective:   Physical Exam Constitutional:      General: She is not in acute distress.    Appearance: Normal appearance. She is well-developed. She is obese. She is not ill-appearing or diaphoretic.  HENT:     Head: Normocephalic and atraumatic.     Right Ear: Tympanic membrane, ear canal and external ear normal.     Left Ear: Tympanic membrane, ear canal and external ear normal.     Nose: Nose normal. No congestion.     Mouth/Throat:     Mouth: Mucous membranes are moist.     Pharynx: Oropharynx is clear. No posterior oropharyngeal erythema.  Eyes:     General: No scleral icterus.    Extraocular Movements: Extraocular movements intact.     Conjunctiva/sclera: Conjunctivae normal.     Pupils: Pupils are equal, round, and reactive to light.  Neck:     Thyroid: No thyromegaly.     Vascular: No  carotid bruit or JVD.  Cardiovascular:     Rate and Rhythm: Normal rate and regular rhythm.     Pulses: Normal pulses.     Heart sounds: Normal heart sounds.    No gallop.  Pulmonary:     Effort: Pulmonary effort is normal. No respiratory distress.     Breath sounds: Normal breath sounds. No wheezing.     Comments: Good air exch Chest:     Chest wall: No tenderness.  Abdominal:      General: Bowel sounds are normal. There is no distension or abdominal bruit.     Palpations: Abdomen is soft. There is no mass.     Tenderness: There is no abdominal tenderness.     Hernia: No hernia is present.  Genitourinary:    Comments: Breast exam: No mass, nodules, thickening, tenderness, bulging, retraction, inflamation, nipple discharge or skin changes noted.  No axillary or clavicular LA.     Musculoskeletal:        General: No tenderness. Normal range of motion.     Cervical back: Normal range of motion and neck supple. No rigidity. No muscular tenderness.     Right lower leg: No edema.     Left lower leg: No edema.     Comments: No kyphosis Limited rom of LS  Lymphadenopathy:     Cervical: No cervical adenopathy.  Skin:    General: Skin is warm and dry.     Coloration: Skin is not pale.     Findings: No erythema or rash.  Neurological:     Mental Status: She is alert. Mental status is at baseline.     Cranial Nerves: No cranial nerve deficit.     Motor: No abnormal muscle tone.     Coordination: Coordination normal.     Gait: Gait normal.     Deep Tendon Reflexes: Reflexes are normal and symmetric. Reflexes normal.  Psychiatric:        Mood and Affect: Mood normal.        Cognition and Memory: Cognition and memory normal.     Comments: sharp          Assessment & Plan:   Problem List Items Addressed This Visit       Cardiovascular and Mediastinum   HYPERTENSION, BENIGN    bp in fair control at this time  BP Readings from Last 1 Encounters:  08/10/20 128/64  No changes needed Most recent labs reviewed  Disc lifstyle change with low sodium diet and exercise  Plan to continue lisinopril 5 mg daily  triam-hct 37.5-25 mg daily  Verapamil sr 240 mg daily        Endocrine   Hypothyroidism    Lab Results  Component Value Date   TSH 9.07 (H) 08/04/2020  This is up on levothyroxine 112 mcg daily  Sees Dr Jerilynn Mages Will f/u as planned and adj tx if needed   No clinical changes         Nervous and Auditory   Meningioma ([place])    Clinically stable  Continues neuro monitoring         Other   Hyperlipidemia    Disc goals for lipids and reasons to control them Rev last labs with pt Rev low sat fat diet in detail LDL is down-commended Trig up- enc to eat low glycemic diet        Prediabetes    Lab Results  Component Value Date   HGBA1C 6.0 08/04/2020  disc imp  of low glycemic diet and wt loss to prevent DM2       Vasomotor symptoms due to menopause    Pt states that her previous gyn told her to stay on low dose HRT forever to help pelvic floor  She is extremely hesitant to stop it  We rev risks of breast cancer/blood clots  Suggested vaginal estrogen cream instead which may have less risks  She will consider it        Routine general medical examination at a health care facility - Primary    Reviewed health habits including diet and exercise and skin cancer prevention Reviewed appropriate screening tests for age  Also reviewed health mt list, fam hx and immunization status , as well as social and family history   See HPI amw rev  Labs rev  Colonoscopy due-pt plans to call Dr [facility] office to schedule  Mammogram utd  dexa utd and nl  imms utd         .

## 2020-08-11 NOTE — Assessment & Plan Note (Signed)
Lab Results  Component Value Date   HGBA1C 6.0 08/04/2020   disc imp of low glycemic diet and wt loss to prevent DM2

## 2020-08-11 NOTE — Assessment & Plan Note (Signed)
Reviewed health habits including diet and exercise and skin cancer prevention Reviewed appropriate screening tests for age  Also reviewed health mt list, fam hx and immunization status , as well as social and family history   See HPI amw rev  Labs rev  Colonoscopy due-pt plans to call Dr [facility] office to schedule  Mammogram utd  dexa utd and nl  imms utd

## 2020-08-11 NOTE — Assessment & Plan Note (Signed)
Lab Results  Component Value Date   TSH 9.07 (H) 08/04/2020   This is up on levothyroxine 112 mcg daily  Sees Dr Jerilynn Mages Will f/u as planned and adj tx if needed  No clinical changes

## 2020-08-11 NOTE — Assessment & Plan Note (Signed)
Disc goals for lipids and reasons to control them Rev last labs with pt Rev low sat fat diet in detail LDL is down-commended Trig up- enc to eat low glycemic diet

## 2020-08-11 NOTE — Assessment & Plan Note (Addendum)
Clinically stable  Continues neuro monitoring

## 2020-08-11 NOTE — Assessment & Plan Note (Signed)
Pt states that her previous gyn told her to stay on low dose HRT forever to help pelvic floor  She is extremely hesitant to stop it  We rev risks of breast cancer/blood clots  Suggested vaginal estrogen cream instead which may have less risks  She will consider it

## 2020-08-11 NOTE — Assessment & Plan Note (Signed)
bp in fair control at this time  BP Readings from Last 1 Encounters:  08/10/20 128/64   No changes needed Most recent labs reviewed  Disc lifstyle change with low sodium diet and exercise  Plan to continue lisinopril 5 mg daily  triam-hct 37.5-25 mg daily  Verapamil sr 240 mg daily

## 2020-08-26 ENCOUNTER — Telehealth: Payer: PPO

## 2020-08-26 ENCOUNTER — Ambulatory Visit (INDEPENDENT_AMBULATORY_CARE_PROVIDER_SITE_OTHER): Payer: PPO

## 2020-08-26 DIAGNOSIS — E785 Hyperlipidemia, unspecified: Secondary | ICD-10-CM | POA: Diagnosis not present

## 2020-08-26 DIAGNOSIS — I10 Essential (primary) hypertension: Secondary | ICD-10-CM | POA: Diagnosis not present

## 2020-08-26 NOTE — Patient Instructions (Signed)
Visit Information:  Thank you for taking the time to speak with me today.   PATIENT GOALS:  Goals Addressed             This Visit's Progress    Monitor and manage high blood pressure/ high cholesterol   On track    Timeframe:  Long-Range Goal Priority:  Medium Start Date:   07/22/2020                          Expected End Date: 12/15/2020                    Follow Up Date 10/07/2020 - check blood pressure at least 1 time per week- write blood pressure results in a log or diary ( purchase new monitor or have existing monitor calibrated at your provider visit).  Call your insurance plan to determine if you have discount / allowance option for blood pressure monitor.  - Continue to take your medication as requested.  Call for medicine refill 2 or 3 days before it runs out - Eat a heart healthy/ low salt diet.  Reduce your intake of saturated fat and trans fat (See education information sent to you in the mail on heart healthy / low salt diet) -  Follow up with your doctor as recommended.  - increase your activity with exercise if you are able and ok with your doctor. (See exercise booklet sent to you in the mail)    Why is this important?   You won't feel high blood pressure, but it can still hurt your blood vessels.  High blood pressure can cause heart or kidney problems. It can also cause a stroke.  Making lifestyle changes like losing a little weight or eating less salt will help.  Checking your blood pressure at home and at different times of the day can help to control blood pressure.  If the doctor prescribes medicine remember to take it the way the doctor ordered.  Call the office if you cannot afford the medicine or if there are questions about it.           The patient verbalized understanding of instructions, educational materials, and care plan provided today and agreed to receive a mailed copy of patient instructions, educational materials, and care plan.   The patient  has been provided with contact information for the care management team and has been advised to call with any health related questions or concerns.  The care management team will reach out to the patient again over the next 45 days.   Quinn Plowman RN,BSN,CCM RN Case Manager [facility]  [phone]

## 2020-08-26 NOTE — Chronic Care Management (AMB) (Signed)
Chronic Care Management   CCM RN Visit Note  08/26/2020 Name: Amber Miles MRN: [id number] DOB: 1945-03-18  Subjective: Amber Miles is a 75 y.o. year old female who is a primary care patient of Tower, Wynelle Fanny, MD. The care management team was consulted for assistance with disease management and care coordination needs.    Engaged with patient by telephone for follow up visit in response to provider referral for case management and/or care coordination services.   Consent to Services:  The patient was given information about Chronic Care Management services, agreed to services, and gave verbal consent prior to initiation of services.  Please see initial visit note for detailed documentation.   Patient agreed to services and verbal consent obtained.   Assessment: Review of patient past medical history, allergies, medications, health status, including review of consultants reports, laboratory and other test data, was performed as part of comprehensive evaluation and provision of chronic care management services.   SDOH (Social Determinants of Health) assessments and interventions performed:    CCM Care Plan  Allergies  Allergen Reactions   Neosporin [Neomycin-Polymyxin-Gramicidin] Other (See Comments)    blisters   Tape Swelling    Itching and redness Pt said severe reaction to surgical tape.? latex    Outpatient Encounter Medications as of 08/26/2020  Medication Sig Note   acetaminophen (TYLENOL) 325 MG tablet Take 650 mg by mouth every 6 (six) hours as needed.    acyclovir (ZOVIRAX) 800 MG tablet TAKE 1 TABLET BY MOUTH 3 TIMES DAILY FOR 3 DAYS FLARES 05/27/2014: Received from: External Pharmacy   albuterol (PROAIR HFA) 108 (90 Base) MCG/ACT inhaler USE 2 PUFFS EVERY 4 HOURS AS NEEDED FOR WHEEZING    amoxicillin (AMOXIL) 500 MG capsule Take 500 mg by mouth as directed. 07/22/2020: Patient states this is for dental appointment.    azelastine (ASTELIN) 0.1 % nasal spray Place 1 spray  into both nostrils 2 (two) times daily.    Docusate Calcium (STOOL SOFTENER PO) Take 1 tablet by mouth daily as needed.    esomeprazole (NEXIUM) 40 MG capsule TAKE 1 CAPSULE BY MOUTH EVERY DAY BEFORE BREAKFAST    estradiol (VIVELLE-DOT) 0.0375 MG/24HR PLACE 1 PATCH ONTO THE SKIN 2 (TWO) TIMES A WEEK.    FLOVENT HFA 110 MCG/ACT inhaler Inhale 2 puffs into the lungs in the morning and at bedtime.    Glucosamine-Chondroitin 750-600 MG TABS Take 1 tablet by mouth 2 (two) times daily.      lisinopril (ZESTRIL) 5 MG tablet TAKE 1 TABLET BY MOUTH EVERY DAY    montelukast (SINGULAIR) 10 MG tablet Take 10 mg by mouth daily.     Multiple Vitamin (MULTIVITAMIN) tablet Take 1 tablet by mouth daily.      mupirocin ointment (BACTROBAN) 2 % Apply 1 application topically 2 (two) times daily. To affected area/wound    potassium chloride SA (KLOR-CON M20) 20 MEQ tablet TAKE 1 TABLET BY MOUTH EVERY DAY    SYNTHROID 112 MCG tablet Take 112 mcg by mouth daily.    tiZANidine (ZANAFLEX) 4 MG tablet Take 1 tablet (4 mg total) by mouth every 6 (six) hours as needed.    traMADol (ULTRAM) 50 MG tablet TAKE 1-2 TABLETS BY MOUTH EVERY 8 HOURS AS NEEDED FOR SEVERE(SHOULDER PAIN). CAUTION OF SEDATION    triamterene-hydrochlorothiazide (MAXZIDE-25) 37.5-25 MG tablet TAKE 2 TABLETS BY MOUTH EVERY DAY    verapamil (CALAN-SR) 240 MG CR tablet TAKE 1 TABLET (240 MG TOTAL) BY MOUTH 2 (TWO) TIMES  DAILY.    No facility-administered encounter medications on file as of 08/26/2020.    Patient Active Problem List   Diagnosis Date Noted   Routine general medical examination at a health care facility 07/29/2019   Genetic testing 01/02/2018   Family history of breast cancer    Family history of genetic disease carrier    Meningioma ([place]) 07/17/2017   Fall 01/04/2017   Vasomotor symptoms due to menopause 06/26/2016   Atrophic vaginitis 09/01/2015   Pedal edema 05/27/2014   Estrogen deficiency 02/21/2013   Encounter for Medicare  annual wellness exam 02/21/2013   Low back pain potentially associated with radiculopathy 10/11/2012   ALLERGIC RHINITIS 01/28/2010   Prediabetes 07/30/2009   ANXIETY 11/30/2008   Hyperlipidemia 04/13/2008   Hypothyroidism 01/07/2008   HYPERTENSION, BENIGN 01/07/2008   OSTEOARTHRITIS, GENERALIZED 01/07/2008    Conditions to be addressed/monitored:HTN and HLD  Care Plan : Cardiovascular  Updates made by Dannielle Karvonen, RN since 08/26/2020 12:00 AM     Problem: Knowledge deficit related to long term care plan for management of hypertension/ hyperlipidemia   Priority: Medium     Long-Range Goal: Hypertension / Hyperlipidemia  Monitored   Start Date: 07/22/2020  Expected End Date: 12/15/2020  This Visit's Progress: On track  Recent Progress: On track  Priority: Medium  Note:   Objective:  Last practice recorded BP readings:  BP Readings from Last 3 Encounters:  08/10/20 128/64  11/12/19 128/76  07/29/19 128/60  Most recent lipid panel:     Component Value Date/Time   CHOL 186 08/04/2020 0933   TRIG 255.0 (H) 08/04/2020 0933   HDL 46.60 08/04/2020 0933   CHOLHDL 4 08/04/2020 0933   VLDL 51.0 (H) 08/04/2020 0933   LDLCALC 83 07/22/2019 0935   LDLDIRECT 114.0 08/04/2020 0933   Patient confirms history of HTN and hyperlipidemia. Patient states she is doing well. She reports having her annual wellness visit with her primary care provider on 08/10/2020.  RNCM discussed lab results with patient focusing on triglycerides. Noted increase of triglycerides from 1 year ago.  Discussed with patient low saturated fat diet, low glycemic diet as recommended by her provider.  Patient states her last blood pressure taken at home by one of her children was 153/ 80.  Patient states her blood pressure monitor is over 58 years old. RNCM recommended patient have blood pressure monitor calibrated at her next provider visit or obtain new monitor (preferably obtaining new monitor). Advised patient to  contact her Health team advantage insurance plan regarding catalogue/ allowance/ or discount pricing for blood pressure monitor. Encouraged patient to continue to adhere to low salt diet.   Current Barriers:  Knowledge deficit related to long term care plan for self management of hypertension and hyperlipidemia Case Manager Clinical Goal(s):  patient will attend scheduled medical appointments patient will demonstrate ongoing adherence to prescribed treatment plan for hypertension as evidenced by taking all medications as prescribed, monitoring and recording blood pressure as directed, adhering to low sodium/DASH diet patient will verbalize basic understanding of hypertension / hyperlipidemia disease process and self health management plan  Interventions:  Collaboration with Tower, Wynelle Fanny, MD regarding development and update of comprehensive plan of care as evidenced by provider attestation and co-signature Inter-disciplinary care team collaboration (see longitudinal plan of care) Reviewed medications with patient and discussed importance of compliance Discussed plans with patient for ongoing care management follow up and provided patient with direct contact information for care management team Advised patient, providing education and  rationale, to monitor blood pressure daily and record, calling PCP for findings outside established parameters.  Reviewed scheduled/upcoming provider appointments. Patient has follow up appointment scheduled with endocrinologist on 10/04/2020 Information sent to patient on Heart healthy/ low salt diet and exercise  Self-Care Activities: Self administers medications as prescribed Attends all scheduled provider appointments Calls provider office for new concerns, questions, or BP outside discussed parameters Checks BP and records as discussed Follows a low sodium diet/DASH diet Patient Goals: - check blood pressure at least 1 time per week- write blood pressure  results in a log or diary ( purchase new monitor or have existing monitor calibrated at your provider visit).  Call your insurance plan to determine if you have discount / allowance option for blood pressure monitor.  - Continue to take your medication as requested.  Call for medicine refill 2 or 3 days before it runs out - Eat a heart healthy/ low salt diet.  Reduce your intake of saturated fat and trans fat (See education information sent to you in the mail on heart healthy / low salt diet) -  Follow up with your doctor as recommended.  - increase your activity with exercise if you are able and ok with your doctor. (See exercise booklet sent to you in the mail) Follow Up Plan: The patient has been provided with contact information for the care management team and has been advised to call with any health related questions or concerns.  The care management team will reach out to the patient again over the next 45 days.       Plan:The patient has been provided with contact information for the care management team and has been advised to call with any health related questions or concerns.  and The care management team will reach out to the patient again over the next 45 days. Quinn Plowman RN,BSN,CCM RN Case Manager [place]  [phone]

## 2020-09-23 DIAGNOSIS — E669 Obesity, unspecified: Secondary | ICD-10-CM | POA: Diagnosis not present

## 2020-09-23 DIAGNOSIS — N958 Other specified menopausal and perimenopausal disorders: Secondary | ICD-10-CM | POA: Diagnosis not present

## 2020-09-23 DIAGNOSIS — J4520 Mild intermittent asthma, uncomplicated: Secondary | ICD-10-CM | POA: Diagnosis not present

## 2020-09-23 DIAGNOSIS — I10 Essential (primary) hypertension: Secondary | ICD-10-CM | POA: Diagnosis not present

## 2020-09-23 DIAGNOSIS — E042 Nontoxic multinodular goiter: Secondary | ICD-10-CM | POA: Diagnosis not present

## 2020-09-23 DIAGNOSIS — E0521 Thyrotoxicosis with toxic multinodular goiter with thyrotoxic crisis or storm: Secondary | ICD-10-CM | POA: Diagnosis not present

## 2020-09-23 DIAGNOSIS — K229 Disease of esophagus, unspecified: Secondary | ICD-10-CM | POA: Diagnosis not present

## 2020-09-23 DIAGNOSIS — E890 Postprocedural hypothyroidism: Secondary | ICD-10-CM | POA: Diagnosis not present

## 2020-09-23 DIAGNOSIS — D34 Benign neoplasm of thyroid gland: Secondary | ICD-10-CM | POA: Diagnosis not present

## 2020-10-04 DIAGNOSIS — I10 Essential (primary) hypertension: Secondary | ICD-10-CM | POA: Diagnosis not present

## 2020-10-04 DIAGNOSIS — E890 Postprocedural hypothyroidism: Secondary | ICD-10-CM | POA: Diagnosis not present

## 2020-10-04 DIAGNOSIS — E0521 Thyrotoxicosis with toxic multinodular goiter with thyrotoxic crisis or storm: Secondary | ICD-10-CM | POA: Diagnosis not present

## 2020-10-04 DIAGNOSIS — N958 Other specified menopausal and perimenopausal disorders: Secondary | ICD-10-CM | POA: Diagnosis not present

## 2020-10-04 DIAGNOSIS — D34 Benign neoplasm of thyroid gland: Secondary | ICD-10-CM | POA: Diagnosis not present

## 2020-10-04 DIAGNOSIS — E042 Nontoxic multinodular goiter: Secondary | ICD-10-CM | POA: Diagnosis not present

## 2020-10-04 DIAGNOSIS — K229 Disease of esophagus, unspecified: Secondary | ICD-10-CM | POA: Diagnosis not present

## 2020-10-04 DIAGNOSIS — Z6836 Body mass index (BMI) 36.0-36.9, adult: Secondary | ICD-10-CM | POA: Diagnosis not present

## 2020-10-04 DIAGNOSIS — J4520 Mild intermittent asthma, uncomplicated: Secondary | ICD-10-CM | POA: Diagnosis not present

## 2020-10-04 DIAGNOSIS — E669 Obesity, unspecified: Secondary | ICD-10-CM | POA: Diagnosis not present

## 2020-10-07 ENCOUNTER — Other Ambulatory Visit: Payer: Self-pay

## 2020-10-07 ENCOUNTER — Ambulatory Visit (INDEPENDENT_AMBULATORY_CARE_PROVIDER_SITE_OTHER): Payer: PPO

## 2020-10-07 DIAGNOSIS — I10 Essential (primary) hypertension: Secondary | ICD-10-CM

## 2020-10-07 DIAGNOSIS — E785 Hyperlipidemia, unspecified: Secondary | ICD-10-CM

## 2020-10-07 NOTE — Chronic Care Management (AMB) (Signed)
Chronic Care Management   CCM RN Visit Note  10/07/2020 Name: Amber Miles MRN: [id number] DOB: [birth year]  Subjective: Amber Miles is a 75 y.o. year old female who is a primary care patient of Tower, Wynelle Fanny, MD. The care management team was consulted for assistance with disease management and care coordination needs.    Engaged with patient by telephone for follow up visit in response to provider referral for case management and/or care coordination services.   Consent to Services:  The patient was given information about Chronic Care Management services, agreed to services, and gave verbal consent prior to initiation of services.  Please see initial visit note for detailed documentation.   Patient agreed to services and verbal consent obtained.   Assessment: Review of patient past medical history, allergies, medications, health status, including review of consultants reports, laboratory and other test data, was performed as part of comprehensive evaluation and provision of chronic care management services.   SDOH (Social Determinants of Health) assessments and interventions performed:    CCM Care Plan  Allergies  Allergen Reactions   Neosporin [Neomycin-Polymyxin-Gramicidin] Other (See Comments)    blisters   Tape Swelling    Itching and redness Pt said severe reaction to surgical tape.? latex    Outpatient Encounter Medications as of 10/07/2020  Medication Sig Note   acetaminophen (TYLENOL) 325 MG tablet Take 650 mg by mouth every 6 (six) hours as needed.    acyclovir (ZOVIRAX) 800 MG tablet TAKE 1 TABLET BY MOUTH 3 TIMES DAILY FOR 3 DAYS FLARES 05/27/2014: Received from: External Pharmacy   albuterol (PROAIR HFA) 108 (90 Base) MCG/ACT inhaler USE 2 PUFFS EVERY 4 HOURS AS NEEDED FOR WHEEZING    amoxicillin (AMOXIL) 500 MG capsule Take 500 mg by mouth as directed. 07/22/2020: Patient states this is for dental appointment.    azelastine (ASTELIN) 0.1 % nasal spray Place 1 spray  into both nostrils 2 (two) times daily.    Docusate Calcium (STOOL SOFTENER PO) Take 1 tablet by mouth daily as needed.    esomeprazole (NEXIUM) 40 MG capsule TAKE 1 CAPSULE BY MOUTH EVERY DAY BEFORE BREAKFAST    estradiol (VIVELLE-DOT) 0.0375 MG/24HR PLACE 1 PATCH ONTO THE SKIN 2 (TWO) TIMES A WEEK.    FLOVENT HFA 110 MCG/ACT inhaler Inhale 2 puffs into the lungs in the morning and at bedtime.    Glucosamine-Chondroitin 750-600 MG TABS Take 1 tablet by mouth 2 (two) times daily.      lisinopril (ZESTRIL) 5 MG tablet TAKE 1 TABLET BY MOUTH EVERY DAY    montelukast (SINGULAIR) 10 MG tablet Take 10 mg by mouth daily.     Multiple Vitamin (MULTIVITAMIN) tablet Take 1 tablet by mouth daily.      mupirocin ointment (BACTROBAN) 2 % Apply 1 application topically 2 (two) times daily. To affected area/wound    potassium chloride SA (KLOR-CON M20) 20 MEQ tablet TAKE 1 TABLET BY MOUTH EVERY DAY    SYNTHROID 112 MCG tablet Take 112 mcg by mouth daily.    tiZANidine (ZANAFLEX) 4 MG tablet Take 1 tablet (4 mg total) by mouth every 6 (six) hours as needed.    traMADol (ULTRAM) 50 MG tablet TAKE 1-2 TABLETS BY MOUTH EVERY 8 HOURS AS NEEDED FOR SEVERE(SHOULDER PAIN). CAUTION OF SEDATION    triamterene-hydrochlorothiazide (MAXZIDE-25) 37.5-25 MG tablet TAKE 2 TABLETS BY MOUTH EVERY DAY    verapamil (CALAN-SR) 240 MG CR tablet TAKE 1 TABLET (240 MG TOTAL) BY MOUTH 2 (TWO) TIMES  DAILY.    No facility-administered encounter medications on file as of 10/07/2020.    Patient Active Problem List   Diagnosis Date Noted   Routine general medical examination at a health care facility 07/29/2019   Genetic testing 01/02/2018   Family history of breast cancer    Family history of genetic disease carrier    Meningioma ([place]) 07/17/2017   Fall 01/04/2017   Vasomotor symptoms due to menopause 06/26/2016   Atrophic vaginitis 09/01/2015   Pedal edema 05/27/2014   Estrogen deficiency 02/21/2013   Encounter for Medicare  annual wellness exam 02/21/2013   Low back pain potentially associated with radiculopathy 10/11/2012   ALLERGIC RHINITIS 01/28/2010   Prediabetes 07/30/2009   ANXIETY 11/30/2008   Hyperlipidemia 04/13/2008   Hypothyroidism 01/07/2008   HYPERTENSION, BENIGN 01/07/2008   OSTEOARTHRITIS, GENERALIZED 01/07/2008    Conditions to be addressed/monitored:HTN and HLD  Care Plan : Cardiovascular  Updates made by Dannielle Karvonen, RN since 10/07/2020 12:00 AM     Problem: Knowledge deficit related to long term care plan for management of hypertension/ hyperlipidemia   Priority: Medium     Long-Range Goal: Hypertension / Hyperlipidemia  Monitored   Start Date: 07/22/2020  Expected End Date: 01/14/2021  This Visit's Progress: On track  Recent Progress: On track  Priority: Medium  Note:   Objective:  Last practice recorded BP readings:  BP Readings from Last 3 Encounters:  08/10/20 128/64  11/12/19 128/76  07/29/19 128/60  Most recent lipid panel:     Component Value Date/Time   CHOL 186 08/04/2020 0933   TRIG 255.0 (H) 08/04/2020 0933   HDL 46.60 08/04/2020 0933   CHOLHDL 4 08/04/2020 0933   VLDL 51.0 (H) 08/04/2020 0933   LDLCALC 83 07/22/2019 0935   LDLDIRECT 114.0 08/04/2020 0933   Patient confirms history of HTN and hyperlipidemia. Patient states she is doing well.  She reports recent blood pressures:  118/68 and 123/72.  Patient states she took her blood pressure monitor to her provider visit  for calibration as advised by this RNCM. She states she was told the blood pressure cuff was to small.  .  Patient states will purchase a new monitor.  Patient states she is being very mindful of her food choices. She states she is eating smaller portions and snacking less.  Patient states she had dropped 2 lbs at her last doctor visit.  She states she is not formally exercising but states she stays busy with social activities.   Current Barriers:  Knowledge deficit related to long term care  plan for self management of hypertension and hyperlipidemia Case Manager Clinical Goal(s):  patient will attend scheduled medical appointments patient will demonstrate ongoing adherence to prescribed treatment plan for hypertension as evidenced by taking all medications as prescribed, monitoring and recording blood pressure as directed, adhering to low sodium/DASH diet patient will verbalize basic understanding of hypertension / hyperlipidemia disease process and self health management plan  Interventions:  Collaboration with Tower, Wynelle Fanny, MD regarding development and update of comprehensive plan of care as evidenced by provider attestation and co-signature Inter-disciplinary care team collaboration (see longitudinal plan of care) Reviewed medications with patient and discussed importance of compliance Discussed plans with patient for ongoing care management follow up and provided patient with direct contact information for care management team Advised patient, providing education and rationale, to monitor blood pressure daily and record, calling PCP for findings outside established parameters.  Reviewed scheduled/upcoming provider appointments.  Self-Care Activities: Self administers  medications as prescribed Attends all scheduled provider appointments Calls provider office for new concerns, questions, or BP outside discussed parameters Checks BP and records as discussed Follows a low sodium diet/DASH diet Patient Goals: - Continue to check blood pressure at least 1 time per week- write blood pressure results in a log or diary ( purchase new monitor or have existing monitor calibrated at your provider visit).  Call your insurance plan to determine if you have discount / allowance option for blood pressure monitor.  - Continue to take your medication as requested.  Call for medicine refill 2 or 3 days before it runs out - Eat a heart healthy/ low salt diet.  Reduce your intake of saturated fat  and trans fat (See education information sent to you in the mail on heart healthy / low salt diet) -  Follow up with your doctor as recommended.  - increase your activity with exercise if you are able and ok with your doctor.  Follow Up Plan: The patient has been provided with contact information for the care management team and has been advised to call with any health related questions or concerns.  The care management team will reach out to the patient again over the next 2-3 months.       Plan:The patient has been provided with contact information for the care management team and has been advised to call with any health related questions or concerns.  and The care management team will reach out to the patient again over the next 2-3 months. Quinn Plowman RN,BSN,CCM RN Case Manager [place]  [phone]

## 2020-10-07 NOTE — Patient Instructions (Signed)
Visit Information:  Thank you for taking the time to speak with me today.  PATIENT GOALS:  Goals Addressed             This Visit's Progress    Monitor and manage high blood pressure/ high cholesterol   On track    Timeframe:  Long-Range Goal Priority:  Medium Start Date:   07/22/2020                          Expected End Date: 12/15/2020                    Follow Up Date 12/21/2020  - Continue to check blood pressure at least 1 time per week- write blood pressure results in a log or diary ( purchase new monitor or have existing monitor calibrated at your provider visit).  Call your insurance plan to determine if you have discount / allowance option for blood pressure monitor.  - Continue to take your medication as requested.  Call for medicine refill 2 or 3 days before it runs out - Eat a heart healthy/ low salt diet.  Reduce your intake of saturated fat and trans fat (See education information sent to you in the mail on heart healthy / low salt diet) -  Follow up with your doctor as recommended.  - increase your activity with exercise if you are able and ok with your doctor.     Why is this important?   You won't feel high blood pressure, but it can still hurt your blood vessels.  High blood pressure can cause heart or kidney problems. It can also cause a stroke.  Making lifestyle changes like losing a little weight or eating less salt will help.  Checking your blood pressure at home and at different times of the day can help to control blood pressure.  If the doctor prescribes medicine remember to take it the way the doctor ordered.  Call the office if you cannot afford the medicine or if there are questions about it.           The patient verbalized understanding of instructions, educational materials, and care plan provided today and agreed to receive a mailed copy of patient instructions, educational materials, and care plan.   The patient has been provided with contact  information for the care management team and has been advised to call with any health related questions or concerns.  The care management team will reach out to the patient again over the next 2-3 months .   Quinn Plowman RN,BSN,CCM RN Case Manager [place]  [phone]

## 2020-10-14 DIAGNOSIS — Z86010 Personal history of colonic polyps: Secondary | ICD-10-CM | POA: Diagnosis not present

## 2020-10-14 DIAGNOSIS — Z8371 Family history of colonic polyps: Secondary | ICD-10-CM | POA: Diagnosis not present

## 2020-10-14 DIAGNOSIS — Z1211 Encounter for screening for malignant neoplasm of colon: Secondary | ICD-10-CM | POA: Diagnosis not present

## 2020-10-14 DIAGNOSIS — K5730 Diverticulosis of large intestine without perforation or abscess without bleeding: Secondary | ICD-10-CM | POA: Diagnosis not present

## 2020-10-15 DIAGNOSIS — I10 Essential (primary) hypertension: Secondary | ICD-10-CM

## 2020-10-15 DIAGNOSIS — E785 Hyperlipidemia, unspecified: Secondary | ICD-10-CM

## 2020-10-22 DIAGNOSIS — H2513 Age-related nuclear cataract, bilateral: Secondary | ICD-10-CM | POA: Diagnosis not present

## 2020-10-22 DIAGNOSIS — H5203 Hypermetropia, bilateral: Secondary | ICD-10-CM | POA: Diagnosis not present

## 2020-10-22 DIAGNOSIS — H40053 Ocular hypertension, bilateral: Secondary | ICD-10-CM | POA: Diagnosis not present

## 2020-10-25 ENCOUNTER — Other Ambulatory Visit: Payer: Self-pay | Admitting: Family Medicine

## 2020-10-25 DIAGNOSIS — Z1231 Encounter for screening mammogram for malignant neoplasm of breast: Secondary | ICD-10-CM

## 2020-10-27 ENCOUNTER — Other Ambulatory Visit: Payer: Self-pay | Admitting: Family Medicine

## 2020-10-28 NOTE — Telephone Encounter (Signed)
CPE was on 08/10/20, given age and estradiol was last filled on 07/29/19 #24 patches with 1 refill, I will route that Rx to PCP for review.  Also zanaflex was last filled on 07/29/19 #120 tabs with 3 refills, please advise

## 2020-10-28 NOTE — Telephone Encounter (Signed)
At last visit we dicussed replacing the patch with vaginal cream (less risky we think in terms of blood clots and breast cancer) Let me know if she wants to try that before I refill it

## 2020-10-28 NOTE — Telephone Encounter (Signed)
Pt said she did not want to try the vaginal cream she wants to stay on the patches she's currently using. She also said she is almost out of zanaflex and would like PCP to fill both meds

## 2020-11-12 ENCOUNTER — Other Ambulatory Visit: Payer: Self-pay | Admitting: Family Medicine

## 2020-11-12 NOTE — Telephone Encounter (Signed)
Name of Medication: Tramadol Name of Pharmacy: [facility] or Written Date and Quantity: 08/05/20 #30 tabs with 0 Last Office Visit and Type: CPE on 08/10/20 Next Office Visit and Type: none scheduled

## 2020-11-23 ENCOUNTER — Other Ambulatory Visit: Payer: Self-pay

## 2020-11-23 ENCOUNTER — Ambulatory Visit: Payer: PPO | Admitting: Podiatry

## 2020-11-23 ENCOUNTER — Ambulatory Visit (INDEPENDENT_AMBULATORY_CARE_PROVIDER_SITE_OTHER): Payer: PPO

## 2020-11-23 DIAGNOSIS — M7751 Other enthesopathy of right foot: Secondary | ICD-10-CM

## 2020-11-23 MED ORDER — METHYLPREDNISOLONE 4 MG PO TBPK: ORAL_TABLET | ORAL | 0 refills | Status: DC

## 2020-11-23 MED ORDER — BETAMETHASONE SOD PHOS & ACET 6 (3-3) MG/ML IJ SUSP
3.0000 mg | Freq: Once | INTRAMUSCULAR | Status: AC
  Administered 2020-11-23: 3 mg via INTRA_ARTICULAR

## 2020-11-23 NOTE — Progress Notes (Signed)
   Subjective:  75 y.o. female presenting today for new complaint regarding pain and tenderness associated to the lateral aspect of the right foot and ankle.  Patient states that she has had pain associated to this area for the past few weeks.  Sudden onset.  She denies a history of injury.  She states that she woke up 1 evening and a significant amount of pain.  It has improved over the past few weeks.  She presents for further treatment and evaluation   Past Medical History:  Diagnosis Date   Allergy    allergic rhinitis   Anxiety    Arthritis    OA knee replacement   Cataract    Degenerative disc disease, thoracic    Family history of breast cancer    Family history of genetic disease carrier    paternal cousin has NBN mutation   GERD (gastroesophageal reflux disease)    Hyperlipidemia    Hypertension    Hypothyroid    Reactive airways dysfunction syndrome (HCC)    Skin lesions, generalized    squamous cell skin lesions and basal cell skin lesions     Objective / Physical Exam:  General:  The patient is alert and oriented x3 in no acute distress. Dermatology:  Skin is warm, dry and supple bilateral lower extremities. Negative for open lesions or macerations. Vascular:  Palpable pedal pulses bilaterally. No edema or erythema noted. Capillary refill within normal limits. Neurological:  Epicritic and protective threshold grossly intact bilaterally.  Musculoskeletal Exam:  Pain on palpation to the lateral aspects of the patient's right ankle. Mild edema noted. Range of motion within normal limits to all pedal and ankle joints bilateral. Muscle strength 5/5 in all groups bilateral.   Radiographic Exam:  Diffuse degenerative changes noted throughout the pedal joints of the foot consistent with an arthritic foot type and degenerative joint disease.  No fractures identified.  Assessment: 1.  Capsulitis right ankle  Plan of Care:  1. Patient was evaluated. X-Rays reviewed.   2. Injection of 0.5 mL Celestone Soluspan injected in the patient's right ankle. 3.  Prescription for Medrol Dosepak 4.  No NSAIDs prescribed.  Patient does have a history of renal impairment 5.  Continue wearing good supportive new balance shoes 6.  Return to clinic 4 weeks.  If there is no significant improvement we may need to consider an ankle brace   Edrick Kins, DPM [facility]  Dr. Edrick Kins, [address]                                        [place], [place] [address]                Office [phone]  Fax [phone]

## 2020-11-30 ENCOUNTER — Ambulatory Visit (INDEPENDENT_AMBULATORY_CARE_PROVIDER_SITE_OTHER): Payer: PPO

## 2020-11-30 DIAGNOSIS — E785 Hyperlipidemia, unspecified: Secondary | ICD-10-CM

## 2020-11-30 DIAGNOSIS — I10 Essential (primary) hypertension: Secondary | ICD-10-CM

## 2020-11-30 NOTE — Chronic Care Management (AMB) (Signed)
Chronic Care Management   CCM RN Visit Note  11/30/2020 Name: Amber Miles MRN: [id number] DOB: 06-24-1945  Subjective: Amber Miles is a 75 y.o. year old female who is a primary care patient of [facility], Wynelle Fanny, MD. The care management team was consulted for assistance with disease management and care coordination needs.    Engaged with patient by telephone for follow up visit in response to provider referral for case management and/or care coordination services.   Consent to Services:  The patient was given information about Chronic Care Management services, agreed to services, and gave verbal consent prior to initiation of services.  Please see initial visit note for detailed documentation.   Patient agreed to services and verbal consent obtained.   Assessment: Review of patient past medical history, allergies, medications, health status, including review of consultants reports, laboratory and other test data, was performed as part of comprehensive evaluation and provision of chronic care management services.   SDOH (Social Determinants of Health) assessments and interventions performed:    CCM Care Plan  Allergies  Allergen Reactions   Neosporin [Neomycin-Polymyxin-Gramicidin] Other (See Comments)    blisters   Tape Swelling    Itching and redness Pt said severe reaction to surgical tape.? latex    Outpatient Encounter Medications as of 11/30/2020  Medication Sig Note   acetaminophen (TYLENOL) 325 MG tablet Take 650 mg by mouth every 6 (six) hours as needed.    acyclovir (ZOVIRAX) 800 MG tablet TAKE 1 TABLET BY MOUTH 3 TIMES DAILY FOR 3 DAYS FLARES 05/27/2014: Received from: External Pharmacy   albuterol (PROAIR HFA) 108 (90 Base) MCG/ACT inhaler USE 2 PUFFS EVERY 4 HOURS AS NEEDED FOR WHEEZING    amoxicillin (AMOXIL) 500 MG capsule Take 500 mg by mouth as directed. 07/22/2020: Patient states this is for dental appointment.    azelastine (ASTELIN) 0.1 % nasal spray Place 1 spray  into both nostrils 2 (two) times daily.    Docusate Calcium (STOOL SOFTENER PO) Take 1 tablet by mouth daily as needed.    esomeprazole (NEXIUM) 40 MG capsule TAKE 1 CAPSULE BY MOUTH EVERY DAY BEFORE BREAKFAST    estradiol (VIVELLE-DOT) 0.0375 MG/24HR PLACE 1 PATCH ONTO THE SKIN 2 (TWO) TIMES A WEEK.    FLOVENT HFA 110 MCG/ACT inhaler Inhale 2 puffs into the lungs in the morning and at bedtime.    Glucosamine-Chondroitin 750-600 MG TABS Take 1 tablet by mouth 2 (two) times daily.      lisinopril (ZESTRIL) 5 MG tablet TAKE 1 TABLET BY MOUTH EVERY DAY    methylPREDNISolone (MEDROL DOSEPAK) 4 MG TBPK tablet 6 day dose pack - take as directed    montelukast (SINGULAIR) 10 MG tablet Take 10 mg by mouth daily.     Multiple Vitamin (MULTIVITAMIN) tablet Take 1 tablet by mouth daily.      mupirocin ointment (BACTROBAN) 2 % Apply 1 application topically 2 (two) times daily. To affected area/wound    potassium chloride SA (KLOR-CON M20) 20 MEQ tablet TAKE 1 TABLET BY MOUTH EVERY DAY    SYNTHROID 112 MCG tablet Take 112 mcg by mouth daily.    tiZANidine (ZANAFLEX) 4 MG tablet TAKE 1 TABLET BY MOUTH EVERY 6 HOURS AS NEEDED    traMADol (ULTRAM) 50 MG tablet TAKE 1-2 TABLETS BY MOUTH EVERY 8 HOURS AS NEEDED FOR SEVERE(SHOULDER PAIN). CAUTION OF SEDATION    triamterene-hydrochlorothiazide (MAXZIDE-25) 37.5-25 MG tablet TAKE 2 TABLETS BY MOUTH EVERY DAY    verapamil (CALAN-SR) 240  MG CR tablet TAKE 1 TABLET (240 MG TOTAL) BY MOUTH 2 (TWO) TIMES DAILY.    No facility-administered encounter medications on file as of 11/30/2020.    Patient Active Problem List   Diagnosis Date Noted   Routine general medical examination at a health care facility 07/29/2019   Genetic testing 01/02/2018   Family history of breast cancer    Family history of genetic disease carrier    Meningioma ([place]) 07/17/2017   Fall 01/04/2017   Vasomotor symptoms due to menopause 06/26/2016   Atrophic vaginitis 09/01/2015   Pedal edema  05/27/2014   Estrogen deficiency 02/21/2013   Encounter for Medicare annual wellness exam 02/21/2013   Low back pain potentially associated with radiculopathy 10/11/2012   ALLERGIC RHINITIS 01/28/2010   Prediabetes 07/30/2009   ANXIETY 11/30/2008   Hyperlipidemia 04/13/2008   Hypothyroidism 01/07/2008   HYPERTENSION, BENIGN 01/07/2008   OSTEOARTHRITIS, GENERALIZED 01/07/2008    Conditions to be addressed/monitored:HTN and HLD  Care Plan : [facility] of care  Updates made by Dannielle Karvonen, RN since 11/30/2020 12:00 AM     Problem: Chronic Disease management education and/ or coordination needs ( HTN, HLD)   Priority: High     Long-Range Goal: Development of plan of care to address chronic disease management and /or care coordination needs ( HTN, HLD)   Start Date: 11/30/2020  Expected End Date: 03/15/2021  Priority: High  Note:   Current Barriers:  Knowledge Deficits related to plan of care for management of HTN and HLD  Chronic Disease Management support and education needs related to HTN and HLD Patient states she developed a pinched nerve in her ankle which radiates to her foot.  She states she has seen her doctor regarding this and was treated with prednisone and follows up with doctor in 4 weeks.   Patient reports completing the prednisone on yesterday.  Patient states she has not checked her blood pressure recently. RNCM discussed with patient importance of checking blood pressure and advised to check once per week.   RNCM Clinical Goal(s):  Patient will verbalize understanding of plan for management of HTN and HLD take all medications exactly as prescribed and will call provider for medication related questions attend all scheduled medical appointments:  continue to work with RN Care Manager to address care management and care coordination needs related to  HTN and HLD through collaboration with RN Care manager, provider, and care team.   Interventions: 1:1 collaboration  with primary care provider regarding development and update of comprehensive plan of care as evidenced by provider attestation and co-signature Inter-disciplinary care team collaboration (see longitudinal plan of care) Evaluation of current treatment plan related to  self management and patient's adherence to plan as established by provider  Hypertension Interventions: Last practice recorded BP readings:  BP Readings from Last 3 Encounters:  08/10/20 128/64  11/12/19 128/76  07/29/19 128/60  Most recent eGFR/CrCl: No results found for: EGFR  No components found for: CRCL  Evaluation of current treatment plan related to hypertension self management and patient's adherence to plan as established by provider; Reviewed medications with patient and discussed importance of compliance; Discussed plans with patient for ongoing care management follow up and provided patient with direct contact information for care management team; Reviewed scheduled/upcoming provider appointments including:  Advised patient to monitor blood pressure at least 1 time per week.   Hyperlipidemia Interventions: Medication review performed; medication list updated in electronic medical record.  Reviewed importance of limiting foods high  in cholesterol;  Advised patient to follow up with providers as recommended.  Advised patient to continue to follow a heart healthy, low salt diet.    Patient Goals/Self-Care Activities: - check blood pressure weekly - take all medications exactly as prescribed - call doctor when you experience any new symptoms - go to all doctor appointments as scheduled Continue to follow a heart healthy, low salt diet Increase your activity with exercise if you are able and ok with your doctor.        Plan:The patient has been provided with contact information for the care management team and has been advised to call with any health related questions or concerns.  The care management team  will reach out to the patient again over the next 2-3 months . Quinn Plowman RN,BSN,CCM RN Case Manager [place]  [phone]

## 2020-11-30 NOTE — Patient Instructions (Addendum)
Visit Information:  Thank you for taking the time to speak with me today.   Patient Goals/Self-Care Activities: - check blood pressure weekly - take all medications exactly as prescribed - call doctor when you experience any new symptoms - go to all doctor appointments as scheduled Continue to follow a heart healthy, low salt diet Increase your activity with exercise if you are able and ok with your doctor  The patient verbalized understanding of instructions, educational materials, and care plan provided today and agreed to receive a mailed copy of patient instructions, educational materials, and care plan.   The patient has been provided with contact information for the care management team and has been advised to call with any health related questions or concerns.  The care management team will reach out to the patient on February 21, 2021 at 2:30 pm.   Quinn Plowman RN,BSN,CCM RN Case Manager [place]  [phone]

## 2020-12-01 ENCOUNTER — Other Ambulatory Visit: Payer: Self-pay

## 2020-12-01 ENCOUNTER — Ambulatory Visit
Admission: RE | Admit: 2020-12-01 | Discharge: 2020-12-01 | Disposition: A | Discharge status: Home / Self Care | Payer: PPO | Source: Ambulatory Visit | Attending: Family Medicine | Admitting: Family Medicine

## 2020-12-01 DIAGNOSIS — Z1231 Encounter for screening mammogram for malignant neoplasm of breast: Secondary | ICD-10-CM | POA: Diagnosis not present

## 2020-12-03 ENCOUNTER — Other Ambulatory Visit: Payer: Self-pay | Admitting: Family Medicine

## 2020-12-03 DIAGNOSIS — R928 Other abnormal and inconclusive findings on diagnostic imaging of breast: Secondary | ICD-10-CM

## 2020-12-15 DIAGNOSIS — I10 Essential (primary) hypertension: Secondary | ICD-10-CM

## 2020-12-15 DIAGNOSIS — E785 Hyperlipidemia, unspecified: Secondary | ICD-10-CM

## 2020-12-20 ENCOUNTER — Other Ambulatory Visit: Payer: Self-pay | Admitting: Family Medicine

## 2020-12-20 ENCOUNTER — Ambulatory Visit
Admission: RE | Admit: 2020-12-20 | Discharge: 2020-12-20 | Disposition: A | Discharge status: Home / Self Care | Payer: PPO | Source: Ambulatory Visit | Attending: Family Medicine | Admitting: Family Medicine

## 2020-12-20 ENCOUNTER — Other Ambulatory Visit: Payer: Self-pay

## 2020-12-20 DIAGNOSIS — R928 Other abnormal and inconclusive findings on diagnostic imaging of breast: Secondary | ICD-10-CM

## 2020-12-20 DIAGNOSIS — N6489 Other specified disorders of breast: Secondary | ICD-10-CM

## 2020-12-20 DIAGNOSIS — N6321 Unspecified lump in the left breast, upper outer quadrant: Secondary | ICD-10-CM | POA: Diagnosis not present

## 2020-12-20 DIAGNOSIS — R922 Inconclusive mammogram: Secondary | ICD-10-CM | POA: Diagnosis not present

## 2020-12-21 ENCOUNTER — Telehealth: Payer: PPO

## 2020-12-21 NOTE — Telephone Encounter (Signed)
Name of Medication: Tramadol Name of Pharmacy: [facility] or Written Date and Quantity: 11/12/20 #30 tabs with 0 Last Office Visit and Type: CPE on 08/10/20 Next Office Visit and Type: none scheduled

## 2020-12-24 ENCOUNTER — Ambulatory Visit: Payer: PPO | Admitting: Podiatry

## 2020-12-28 ENCOUNTER — Ambulatory Visit: Payer: PPO | Admitting: Podiatry

## 2020-12-28 ENCOUNTER — Other Ambulatory Visit: Payer: Self-pay

## 2020-12-28 DIAGNOSIS — I8312 Varicose veins of left lower extremity with inflammation: Secondary | ICD-10-CM | POA: Diagnosis not present

## 2020-12-28 DIAGNOSIS — L821 Other seborrheic keratosis: Secondary | ICD-10-CM | POA: Diagnosis not present

## 2020-12-28 DIAGNOSIS — M7751 Other enthesopathy of right foot: Secondary | ICD-10-CM | POA: Diagnosis not present

## 2020-12-28 DIAGNOSIS — I872 Venous insufficiency (chronic) (peripheral): Secondary | ICD-10-CM | POA: Diagnosis not present

## 2020-12-28 DIAGNOSIS — I8311 Varicose veins of right lower extremity with inflammation: Secondary | ICD-10-CM | POA: Diagnosis not present

## 2020-12-28 DIAGNOSIS — B0089 Other herpesviral infection: Secondary | ICD-10-CM | POA: Diagnosis not present

## 2020-12-28 DIAGNOSIS — D2261 Melanocytic nevi of right upper limb, including shoulder: Secondary | ICD-10-CM | POA: Diagnosis not present

## 2020-12-28 DIAGNOSIS — Z85828 Personal history of other malignant neoplasm of skin: Secondary | ICD-10-CM | POA: Diagnosis not present

## 2020-12-28 DIAGNOSIS — D225 Melanocytic nevi of trunk: Secondary | ICD-10-CM | POA: Diagnosis not present

## 2020-12-28 DIAGNOSIS — D2262 Melanocytic nevi of left upper limb, including shoulder: Secondary | ICD-10-CM | POA: Diagnosis not present

## 2020-12-28 DIAGNOSIS — L570 Actinic keratosis: Secondary | ICD-10-CM | POA: Diagnosis not present

## 2020-12-28 MED ORDER — BETAMETHASONE SOD PHOS & ACET 6 (3-3) MG/ML IJ SUSP
3.0000 mg | Freq: Once | INTRAMUSCULAR | Status: AC
Start: 2020-12-28 — End: 2020-12-28
  Administered 2020-12-28: 3 mg via INTRA_ARTICULAR

## 2020-12-28 NOTE — Progress Notes (Signed)
° °  Subjective:  75 y.o. female presenting today for follow-up evaluation regarding pain and tenderness associated to the lateral aspect of the right foot and ankle.  Patient states that the injection did help.  There has been some improvement but she continues to have some residual pain.  She presents for further treatment evaluation   Past Medical History:  Diagnosis Date   Allergy    allergic rhinitis   Anxiety    Arthritis    OA knee replacement   Cataract    Degenerative disc disease, thoracic    Family history of breast cancer    Family history of genetic disease carrier    paternal cousin has NBN mutation   GERD (gastroesophageal reflux disease)    Hyperlipidemia    Hypertension    Hypothyroid    Reactive airways dysfunction syndrome (HCC)    Skin lesions, generalized    squamous cell skin lesions and basal cell skin lesions     Objective / Physical Exam:  General:  The patient is alert and oriented x3 in no acute distress. Dermatology:  Skin is warm, dry and supple bilateral lower extremities. Negative for open lesions or macerations. Vascular:  Palpable pedal pulses bilaterally. No edema or erythema noted. Capillary refill within normal limits. Neurological:  Epicritic and protective threshold grossly intact bilaterally.  Musculoskeletal Exam:  There continues to be some pain on palpation to the lateral aspects of the patient's right ankle. Mild edema noted. Range of motion within normal limits to all pedal and ankle joints bilateral. Muscle strength 5/5 in all groups bilateral.   Radiographic Exam 11/23/2020:  Diffuse degenerative changes noted throughout the pedal joints of the foot consistent with an arthritic foot type and degenerative joint disease.  No fractures identified.  Assessment: 1.  Capsulitis right ankle  Plan of Care:  1. Patient was evaluated. X-Rays reviewed.  2. Injection of 0.5 mL Celestone Soluspan injected in the patient's right ankle. 3.   No NSAIDs prescribed.  Patient does have a history of renal impairment 5.  Continue wearing good supportive new balance shoes 6.  Cam boot dispensed today.  Weightbearing as tolerated x4 weeks 7.  Return to clinic in 4 weeks  Edrick Kins, DPM [facility]  Dr. Edrick Kins, DPM    [address], [place] [address]                Office [phone]  Fax [phone]

## 2021-01-03 ENCOUNTER — Telehealth: Payer: Self-pay | Admitting: Family Medicine

## 2021-01-03 ENCOUNTER — Telehealth: Payer: PPO

## 2021-01-03 ENCOUNTER — Other Ambulatory Visit: Payer: PPO

## 2021-01-03 NOTE — Telephone Encounter (Signed)
Pt is calling in wanting to know why her 1st mammogram in 12/02/19 and her 2nd one have different findings. She is saying the first one says her breasts are nondense and the second one says they are dense. She is wanting to know why there is a difference. Please advise pt at [phone].

## 2021-01-03 NOTE — Telephone Encounter (Signed)
Good question I think "heterogeneously dense" and "scattered areas of density" are really the same thing, just worded different (the best that I can tell)

## 2021-01-03 NOTE — Telephone Encounter (Signed)
Pt notified of Dr. Tower's comments and verbalized understanding  

## 2021-01-21 ENCOUNTER — Encounter: Payer: Self-pay | Admitting: Family Medicine

## 2021-01-21 ENCOUNTER — Ambulatory Visit (INDEPENDENT_AMBULATORY_CARE_PROVIDER_SITE_OTHER): Payer: PPO | Admitting: Family Medicine

## 2021-01-21 ENCOUNTER — Other Ambulatory Visit: Payer: Self-pay

## 2021-01-21 DIAGNOSIS — J209 Acute bronchitis, unspecified: Secondary | ICD-10-CM | POA: Insufficient documentation

## 2021-01-21 MED ORDER — BENZONATATE 200 MG PO CAPS: 200.0000 mg | ORAL_CAPSULE | Freq: Three times a day (TID) | ORAL | 1 refills | Status: DC | PRN

## 2021-01-21 MED ORDER — PREDNISONE 10 MG PO TABS: ORAL_TABLET | ORAL | 0 refills | Status: DC

## 2021-01-21 MED ORDER — PROMETHAZINE-DM 6.25-15 MG/5ML PO SYRP: 5.0000 mL | ORAL_SOLUTION | Freq: Three times a day (TID) | ORAL | 0 refills | Status: DC | PRN

## 2021-01-21 NOTE — Patient Instructions (Addendum)
Drink fluids and rest   Take the prometh DM instead of delsym  Mucinex is ok  Tessalon for cough also   Take the prednisone for bronchitis   Update if not starting to improve in a week or if worsening   If symptoms get severe alert [place] and go to the ER

## 2021-01-21 NOTE — Progress Notes (Signed)
Virtual Visit via Telephone Note  I connected with Amber Miles on 01/21/21 at 11:30 AM EST by telephone and verified that I am speaking with the correct person using two identifiers.  Location: Patient: home Provider: office   I discussed the limitations, risks, security and privacy concerns of performing an evaluation and management service by telephone and the availability of in person appointments. I also discussed with the patient that there may be a patient responsible charge related to this service. The patient expressed understanding and agreed to proceed.  Parties involved in encounter  Patient: Amber Miles  Provider:  Loura Pardon MD   History of Present Illness: Pt presents with c/o cough  Coughing since Friday night Prod of phlegm some times/other times dry  Phlegm is pale yellow  Albuterol helps wheeze  No sob  Had chills over the weekend and then some sweats Did not take temp   Coughs so hard the gags (no vomiting)  Has spells   No nasal congestion  No ST  Wednesday had some diarrhea/ green looking   Took 2 covid tests/both negative   Had a flu shot this season  No known flu contacts  Has been out and about quite a bit prior     Otc Mucinex Cough syrup  -delsym  Vics vapor rub   Albuterol mdi helps   Patient Active Problem List   Diagnosis Date Noted   Acute bronchitis 01/21/2021   Routine general medical examination at a health care facility 07/29/2019   Genetic testing 01/02/2018   Family history of breast cancer    Family history of genetic disease carrier    Meningioma ([place]) 07/17/2017   Fall 01/04/2017   Vasomotor symptoms due to menopause 06/26/2016   Atrophic vaginitis 09/01/2015   Pedal edema 05/27/2014   Estrogen deficiency 02/21/2013   Encounter for Medicare annual wellness exam 02/21/2013   Low back pain potentially associated with radiculopathy 10/11/2012   ALLERGIC RHINITIS 01/28/2010   Prediabetes 07/30/2009   ANXIETY  11/30/2008   Hyperlipidemia 04/13/2008   Hypothyroidism 01/07/2008   HYPERTENSION, BENIGN 01/07/2008   OSTEOARTHRITIS, GENERALIZED 01/07/2008   Past Medical History:  Diagnosis Date   Allergy    allergic rhinitis   Anxiety    Arthritis    OA knee replacement   Cataract    Degenerative disc disease, thoracic    Family history of breast cancer    Family history of genetic disease carrier    paternal cousin has NBN mutation   GERD (gastroesophageal reflux disease)    Hyperlipidemia    Hypertension    Hypothyroid    Reactive airways dysfunction syndrome (HCC)    Skin lesions, generalized    squamous cell skin lesions and basal cell skin lesions   Past Surgical History:  Procedure Laterality Date   ABDOMINAL HYSTERECTOMY  1990s   hyst with oophrectomy for B9lesion/cyst and prolapse   BREAST BIOPSY Bilateral    benign   JOINT REPLACEMENT  05/2009   left total knee replacement   recocele     and cystocele repair   TONSILLECTOMY     tsa     Social History   Tobacco Use   Smoking status: Never   Smokeless tobacco: Never  Vaping Use   Vaping Use: Never used  Substance Use Topics   Alcohol use: No    Alcohol/week: 0.0 standard drinks   Drug use: No   Family History  Problem Relation Age of Onset   Hypertension Mother  Stroke Mother    Depression Mother    Alzheimer's disease Mother    Heart disease Father    Cerebral palsy Father    Hypertension Sister    Breast cancer Cousin 55       NBN mutation   Melanoma Maternal Uncle        dx >50   Allergies  Allergen Reactions   Neosporin [Neomycin-Polymyxin-Gramicidin] Other (See Comments)    blisters   Tape Swelling    Itching and redness Pt said severe reaction to surgical tape.? latex   Current Outpatient Medications on File Prior to Visit  Medication Sig Dispense Refill   acetaminophen (TYLENOL) 325 MG tablet Take 650 mg by mouth every 6 (six) hours as needed.     acyclovir (ZOVIRAX) 800 MG tablet TAKE 1  TABLET BY MOUTH 3 TIMES DAILY FOR 3 DAYS FLARES  2   albuterol (PROAIR HFA) 108 (90 Base) MCG/ACT inhaler USE 2 PUFFS EVERY 4 HOURS AS NEEDED FOR WHEEZING 18 g 3   amoxicillin (AMOXIL) 500 MG capsule Take 500 mg by mouth as directed.     azelastine (ASTELIN) 0.1 % nasal spray Place 1 spray into both nostrils 2 (two) times daily.     Docusate Calcium (STOOL SOFTENER PO) Take 1 tablet by mouth daily as needed.     esomeprazole (NEXIUM) 40 MG capsule TAKE 1 CAPSULE BY MOUTH EVERY DAY BEFORE BREAKFAST 90 capsule 2   estradiol (VIVELLE-DOT) 0.0375 MG/24HR PLACE 1 PATCH ONTO THE SKIN 2 (TWO) TIMES A WEEK. 24 patch 3   FLOVENT HFA 110 MCG/ACT inhaler Inhale 2 puffs into the lungs in the morning and at bedtime.     Glucosamine-Chondroitin 750-600 MG TABS Take 1 tablet by mouth 2 (two) times daily.       lisinopril (ZESTRIL) 5 MG tablet TAKE 1 TABLET BY MOUTH EVERY DAY 90 tablet 2   montelukast (SINGULAIR) 10 MG tablet Take 10 mg by mouth daily.      Multiple Vitamin (MULTIVITAMIN) tablet Take 1 tablet by mouth daily.       potassium chloride SA (KLOR-CON M20) 20 MEQ tablet TAKE 1 TABLET BY MOUTH EVERY DAY 90 tablet 3   SYNTHROID 112 MCG tablet Take 112 mcg by mouth daily.  11   tiZANidine (ZANAFLEX) 4 MG tablet TAKE 1 TABLET BY MOUTH EVERY 6 HOURS AS NEEDED 360 tablet 0   traMADol (ULTRAM) 50 MG tablet TAKE 1-2 TABLETS BY MOUTH EVERY 8 HOURS AS NEEDED FOR SEVERE(SHOULDER PAIN). CAUTION OF SEDATION 30 tablet 0   triamterene-hydrochlorothiazide (MAXZIDE-25) 37.5-25 MG tablet TAKE 2 TABLETS BY MOUTH EVERY DAY 180 tablet 2   verapamil (CALAN-SR) 240 MG CR tablet TAKE 1 TABLET (240 MG TOTAL) BY MOUTH 2 (TWO) TIMES DAILY. 180 tablet 2   No current facility-administered medications on file prior to visit.   Review of Systems  Constitutional:  Negative for chills, fever and malaise/fatigue.  HENT:  Negative for congestion, ear pain, sinus pain and sore throat.   Eyes:  Negative for blurred vision, discharge  and redness.  Respiratory:  Positive for cough, sputum production and wheezing. Negative for shortness of breath and stridor.   Cardiovascular:  Negative for chest pain, palpitations and leg swelling.  Gastrointestinal:  Negative for abdominal pain, diarrhea, nausea and vomiting.  Musculoskeletal:  Negative for myalgias.  Skin:  Negative for rash.  Neurological:  Negative for dizziness and headaches.     Observations/Objective: Pt sounds well, not distressed  Mildly hoarse  No  sob or wheezing with speech Occ hacking cough noted  Nl cognition/good historian Nl mood   Assessment and Plan: Problem List Items Addressed This Visit       Respiratory   Acute bronchitis    S/p viral uri , using albuterol and persistent cough Prednisone 40 mg taper  (aware of potential side effects) tessalon and prometh DM Discussed symptom care  ER precautions discussed  Update if not starting to improve in a week or if worsening    Meds ordered this encounter  Medications   predniSONE (DELTASONE) 10 MG tablet    Sig: Take 4 pills once daily by mouth for 3 days, then 3 pills daily for 3 days, then 2 pills daily for 3 days then 1 pill daily for 3 days then stop    Dispense:  30 tablet    Refill:  0   promethazine-dextromethorphan (PROMETHAZINE-DM) 6.25-15 MG/5ML syrup    Sig: Take 5 mLs by mouth 3 (three) times daily as needed for cough. Caution of sedation    Dispense:  118 mL    Refill:  0   benzonatate (TESSALON) 200 MG capsule    Sig: Take 1 capsule (200 mg total) by mouth 3 (three) times daily as needed for cough. Swallow pill whole, do not bite pill    Dispense:  30 capsule    Refill:  1           Follow Up Instructions: Drink fluids and rest   Take the prometh DM instead of delsym  Mucinex is ok  Tessalon for cough also   Take the prednisone for bronchitis   Update if not starting to improve in a week or if worsening   If symptoms get severe alert [place] and go to the ER   I  discussed the assessment and treatment plan with the patient. The patient was provided an opportunity to ask questions and all were answered. The patient agreed with the plan and demonstrated an understanding of the instructions.   The patient was advised to call back or seek an in-person evaluation if the symptoms worsen or if the condition fails to improve as anticipated.  I provided 16 minutes of non-face-to-face time during this encounter.   Loura Pardon, MD

## 2021-01-21 NOTE — Assessment & Plan Note (Addendum)
S/p viral uri , using albuterol and persistent cough Prednisone 40 mg taper  (aware of potential side effects) tessalon and prometh DM Discussed symptom care  ER precautions discussed  Update if not starting to improve in a week or if worsening    Meds ordered this encounter  Medications   predniSONE (DELTASONE) 10 MG tablet    Sig: Take 4 pills once daily by mouth for 3 days, then 3 pills daily for 3 days, then 2 pills daily for 3 days then 1 pill daily for 3 days then stop    Dispense:  30 tablet    Refill:  0   promethazine-dextromethorphan (PROMETHAZINE-DM) 6.25-15 MG/5ML syrup    Sig: Take 5 mLs by mouth 3 (three) times daily as needed for cough. Caution of sedation    Dispense:  118 mL    Refill:  0   benzonatate (TESSALON) 200 MG capsule    Sig: Take 1 capsule (200 mg total) by mouth 3 (three) times daily as needed for cough. Swallow pill whole, do not bite pill    Dispense:  30 capsule    Refill:  1

## 2021-01-28 ENCOUNTER — Ambulatory Visit: Payer: PPO | Admitting: Podiatry

## 2021-02-15 ENCOUNTER — Other Ambulatory Visit: Payer: Self-pay | Admitting: Family Medicine

## 2021-02-15 DIAGNOSIS — J4530 Mild persistent asthma, uncomplicated: Secondary | ICD-10-CM

## 2021-02-21 ENCOUNTER — Telehealth: Payer: PPO

## 2021-02-25 ENCOUNTER — Telehealth: Payer: Self-pay | Admitting: Family Medicine

## 2021-02-25 MED ORDER — AMOXICILLIN-POT CLAVULANATE 875-125 MG PO TABS: 1.0000 | ORAL_TABLET | Freq: Two times a day (BID) | ORAL | 0 refills | Status: DC

## 2021-02-25 NOTE — Telephone Encounter (Signed)
Pt notified Rx sent to pharmacy. ER precautions given and pt will f/u if no improvement after finishing abx

## 2021-02-25 NOTE — Telephone Encounter (Signed)
I sent in augmentin  Please f/u if not improving   If short of breath let me know  Please review ER precautions

## 2021-02-25 NOTE — Telephone Encounter (Signed)
Pt called stating that she had an OV with [facility] on 01/21/21. Pt states that she still cant get rid of her cough, pt is asking if she can get an antibiotic called in. Please advise.

## 2021-03-17 ENCOUNTER — Ambulatory Visit: Payer: PPO

## 2021-03-17 DIAGNOSIS — E785 Hyperlipidemia, unspecified: Secondary | ICD-10-CM

## 2021-03-17 DIAGNOSIS — I10 Essential (primary) hypertension: Secondary | ICD-10-CM

## 2021-03-17 NOTE — Patient Instructions (Signed)
Visit Information ? ?Thank you for taking time to visit with me today. Please don't hesitate to contact me if I can be of assistance to you before our next scheduled telephone appointment. ? ?Following are the goals we discussed today:  ?- Call insurance company for possible coverage of blood pressure monitor.  ?- Continue to take all medications exactly as prescribed ?- call doctor when you experience any new symptoms ?- Follow up with your providers as recommended. ?Continue to follow a heart healthy, low salt diet ?Increase your activity with exercise if you are able and ok with your doctor. ? ?Our next appointment is by telephone on 06/02/2021 at 2:00 pm ? ?Please call the care guide team at [phone] if you need to cancel or reschedule your appointment.  ? ?If you are experiencing a Mental Health or [place] or need someone to talk to, please call the Suicide and Crisis Lifeline: 988 ?call 1-800-273-TALK (toll free, 24 hour hotline)  ? ?The patient verbalized understanding of instructions, educational materials, and care plan provided today and agreed to receive a mailed copy of patient instructions, educational materials, and care plan.  ? ?Quinn Plowman RN,BSN,CCM ?RN Case Manager ?[place]  ?[phone] ? ?

## 2021-03-17 NOTE — Chronic Care Management (AMB) (Signed)
Chronic Care Management   CCM RN Visit Note  03/17/2021 Name: Amber Miles MRN: [id number] DOB: March 10, 1945  Subjective: Amber Miles is a 76 y.o. year old female who is a primary care patient of Tower, Wynelle Fanny, MD. The care management team was consulted for assistance with disease management and care coordination needs.    Engaged with patient by telephone for follow up visit in response to provider referral for case management and/or care coordination services.   Consent to Services:  The patient was given information about Chronic Care Management services, agreed to services, and gave verbal consent prior to initiation of services.  Please see initial visit note for detailed documentation.   Patient agreed to services and verbal consent obtained.   Assessment: Review of patient past medical history, allergies, medications, health status, including review of consultants reports, laboratory and other test data, was performed as part of comprehensive evaluation and provision of chronic care management services.   SDOH (Social Determinants of Health) assessments and interventions performed:    CCM Care Plan  Allergies  Allergen Reactions   Neosporin [Neomycin-Polymyxin-Gramicidin] Other (See Comments)    blisters   Tape Swelling    Itching and redness Pt said severe reaction to surgical tape.? latex    Outpatient Encounter Medications as of 03/17/2021  Medication Sig Note   acetaminophen (TYLENOL) 325 MG tablet Take 650 mg by mouth every 6 (six) hours as needed.    acyclovir (ZOVIRAX) 800 MG tablet TAKE 1 TABLET BY MOUTH 3 TIMES DAILY FOR 3 DAYS FLARES 05/27/2014: Received from: External Pharmacy   albuterol (VENTOLIN HFA) 108 (90 Base) MCG/ACT inhaler INHALE 1 TO 2 PUFFS EVERY 4 TO 6 HOURS AS NEEDED FOR WHEEZE OR COUGH    amoxicillin (AMOXIL) 500 MG capsule Take 500 mg by mouth as directed. 07/22/2020: Patient states this is for dental appointment.    azelastine (ASTELIN) 0.1 % nasal  spray Place 1 spray into both nostrils 2 (two) times daily.    Cholecalciferol (VITAMIN D3) 25 MCG (1000 UT) CAPS Take by mouth.    Docusate Calcium (STOOL SOFTENER PO) Take 1 tablet by mouth daily as needed.    esomeprazole (NEXIUM) 40 MG capsule TAKE 1 CAPSULE BY MOUTH EVERY DAY BEFORE BREAKFAST    estradiol (VIVELLE-DOT) 0.0375 MG/24HR PLACE 1 PATCH ONTO THE SKIN 2 (TWO) TIMES A WEEK.    FLOVENT HFA 110 MCG/ACT inhaler Inhale 2 puffs into the lungs in the morning and at bedtime.    Glucosamine-Chondroitin 750-600 MG TABS Take 1 tablet by mouth 2 (two) times daily.      lisinopril (ZESTRIL) 5 MG tablet TAKE 1 TABLET BY MOUTH EVERY DAY    montelukast (SINGULAIR) 10 MG tablet Take 10 mg by mouth daily.     Multiple Vitamin (MULTIVITAMIN) tablet Take 1 tablet by mouth daily.      potassium chloride SA (KLOR-CON M20) 20 MEQ tablet TAKE 1 TABLET BY MOUTH EVERY DAY    SYNTHROID 112 MCG tablet Take 112 mcg by mouth daily.    tiZANidine (ZANAFLEX) 4 MG tablet TAKE 1 TABLET BY MOUTH EVERY 6 HOURS AS NEEDED    traMADol (ULTRAM) 50 MG tablet TAKE 1-2 TABLETS BY MOUTH EVERY 8 HOURS AS NEEDED FOR SEVERE(SHOULDER PAIN). CAUTION OF SEDATION    triamterene-hydrochlorothiazide (MAXZIDE-25) 37.5-25 MG tablet TAKE 2 TABLETS BY MOUTH EVERY DAY    verapamil (CALAN-SR) 240 MG CR tablet TAKE 1 TABLET (240 MG TOTAL) BY MOUTH 2 (TWO) TIMES DAILY.  amoxicillin-clavulanate (AUGMENTIN) 875-125 MG tablet Take 1 tablet by mouth 2 (two) times daily. (Patient not taking: Reported on 03/17/2021)    benzonatate (TESSALON) 200 MG capsule Take 1 capsule (200 mg total) by mouth 3 (three) times daily as needed for cough. Swallow pill whole, do not bite pill (Patient not taking: Reported on 03/17/2021)    predniSONE (DELTASONE) 10 MG tablet Take 4 pills once daily by mouth for 3 days, then 3 pills daily for 3 days, then 2 pills daily for 3 days then 1 pill daily for 3 days then stop (Patient not taking: Reported on 03/17/2021)     promethazine-dextromethorphan (PROMETHAZINE-DM) 6.25-15 MG/5ML syrup Take 5 mLs by mouth 3 (three) times daily as needed for cough. Caution of sedation (Patient not taking: Reported on 03/17/2021)    No facility-administered encounter medications on file as of 03/17/2021.    Patient Active Problem List   Diagnosis Date Noted   Acute bronchitis 01/21/2021   Routine general medical examination at a health care facility 07/29/2019   Genetic testing 01/02/2018   Family history of breast cancer    Family history of genetic disease carrier    Meningioma ([place]) 07/17/2017   Fall 01/04/2017   Vasomotor symptoms due to menopause 06/26/2016   Atrophic vaginitis 09/01/2015   Pedal edema 05/27/2014   Estrogen deficiency 02/21/2013   Encounter for Medicare annual wellness exam 02/21/2013   Low back pain potentially associated with radiculopathy 10/11/2012   ALLERGIC RHINITIS 01/28/2010   Prediabetes 07/30/2009   ANXIETY 11/30/2008   Hyperlipidemia 04/13/2008   Hypothyroidism 01/07/2008   HYPERTENSION, BENIGN 01/07/2008   OSTEOARTHRITIS, GENERALIZED 01/07/2008    Conditions to be addressed/monitored:HTN and HLD  Care Plan : [facility] plan of care  Updates made by Dannielle Karvonen, RN since 03/17/2021 12:00 AM     Problem: Chronic Disease management education and/ or coordination needs ( HTN, HLD)   Priority: High     Long-Range Goal: Development of plan of care to address chronic disease management and /or care coordination needs ( HTN, HLD)   Start Date: 11/30/2020  Expected End Date: 07/15/2021  Priority: High  Note:   Current Barriers:  Knowledge Deficits related to plan of care for management of HTN and HLD  Chronic Disease Management support and education needs related to HTN and HLD Patient states she is doing well. She states she has not obtained a new blood pressure monitor.  Advised patient to call her insurance company for possible coverage of blood pressure monitor.   Patient states the  pinched nerve in her ankle has resolved.   RNCM Clinical Goal(s):  Patient will verbalize understanding of plan for management of HTN and HLD take all medications exactly as prescribed and will call provider for medication related questions attend all scheduled medical appointments:  continue to work with RN Care Manager to address care management and care coordination needs related to  HTN and HLD through collaboration with RN Care manager, provider, and care team.   Interventions: 1:1 collaboration with primary care provider regarding development and update of comprehensive plan of care as evidenced by provider attestation and co-signature Inter-disciplinary care team collaboration (see longitudinal plan of care) Evaluation of current treatment plan related to  self management and patient's adherence to plan as established by provider  Hypertension Interventions: Last practice recorded BP readings:  BP Readings from Last 3 Encounters:  08/10/20 128/64  11/12/19 128/76  07/29/19 128/60  Most recent eGFR/CrCl: No results found for: EGFR  No components found for: CRCL  Evaluation of current treatment plan related to hypertension self management and patient's adherence to plan as established by provider; Reviewed medications with patient and discussed importance of compliance; Discussed plans with patient for ongoing care management follow up and provided patient with direct contact information for care management team; Reviewed scheduled/upcoming provider appointments Advised patient to call her insurance company for possible coverage of blood pressure monitor Discussed importance of monitoring blood pressure   Hyperlipidemia Interventions: Medication review performed; medication list updated in electronic medical record.  Reviewed importance of limiting foods high in cholesterol;  Advised patient to follow up with providers as recommended.  Advised patient to continue to follow a heart  healthy, low salt diet.   Patient Goals/Self-Care Activities: - Call insurance company for possible coverage of blood pressure monitor.  - Continue to take all medications exactly as prescribed - call doctor when you experience any new symptoms - Follow up with your providers as recommended. Continue to follow a heart healthy, low salt diet Increase your activity with exercise if you are able and ok with your doctor.        Plan:The patient has been provided with contact information for the care management team and has been advised to call with any health related questions or concerns.  The care management team will reach out to the patient again over the next 2 - 3 months . Quinn Plowman RN,BSN,CCM RN Case Manager [place]  [phone]

## 2021-04-13 ENCOUNTER — Other Ambulatory Visit: Payer: Self-pay | Admitting: Family Medicine

## 2021-04-14 NOTE — Telephone Encounter (Signed)
Name of Medication: Tramadol ?Name of Pharmacy: [name] ?Last Fill or Written Date and Quantity: 12/21/20 #30 tabs with 0 refills ?Last Office Visit and Type:phone visit/congestion on 01/21/21 (CPE on 08/10/20) ?Next Office Visit and Type: none scheduled  ? ?Zanaflex last filled on 10/28/20 #360 tabs with 0 refills ?

## 2021-04-26 DIAGNOSIS — H2513 Age-related nuclear cataract, bilateral: Secondary | ICD-10-CM | POA: Diagnosis not present

## 2021-04-26 DIAGNOSIS — H40053 Ocular hypertension, bilateral: Secondary | ICD-10-CM | POA: Diagnosis not present

## 2021-05-12 DIAGNOSIS — J300 Vasomotor rhinitis: Secondary | ICD-10-CM | POA: Diagnosis not present

## 2021-05-12 DIAGNOSIS — H1045 Other chronic allergic conjunctivitis: Secondary | ICD-10-CM | POA: Diagnosis not present

## 2021-05-12 DIAGNOSIS — J4530 Mild persistent asthma, uncomplicated: Secondary | ICD-10-CM | POA: Diagnosis not present

## 2021-06-02 ENCOUNTER — Ambulatory Visit (INDEPENDENT_AMBULATORY_CARE_PROVIDER_SITE_OTHER): Payer: PPO

## 2021-06-02 DIAGNOSIS — I10 Essential (primary) hypertension: Secondary | ICD-10-CM

## 2021-06-02 DIAGNOSIS — E785 Hyperlipidemia, unspecified: Secondary | ICD-10-CM

## 2021-06-02 NOTE — Patient Instructions (Signed)
Visit Information  Thank you for taking time to visit with me today. Please don't hesitate to contact me if I can be of assistance to you before our next scheduled telephone appointment.  Following are the goals we discussed today:  Patient goals met.  Chronic care management services closed.   Please call the care guide team at [phone] if you need to cancel or reschedule your appointment.   If you are experiencing a Mental Health or [place] or need someone to talk to, please call the Suicide and Crisis Lifeline: 988 call 1-800-273-TALK (toll free, 24 hour hotline)   The patient verbalized understanding of instructions, educational materials, and care plan provided today and agreed to receive a mailed copy of patient instructions, educational materials, and care plan.   Quinn Plowman RN,BSN,CCM RN Case Manager [place]  [phone]

## 2021-06-02 NOTE — Chronic Care Management (AMB) (Signed)
Chronic Care Management   CCM RN Visit Note  06/02/2021 Name: Amber Miles MRN: [id number] DOB: 21-Apr-1945  Subjective: Amber Miles is a 76 y.o. year old female who is a primary care patient of Tower, Wynelle Fanny, MD. The care management team was consulted for assistance with disease management and care coordination needs.    Engaged with patient by telephone for follow up visit in response to provider referral for case management and/or care coordination services.   Consent to Services:  The patient was given information about Chronic Care Management services, agreed to services, and gave verbal consent prior to initiation of services.  Please see initial visit note for detailed documentation.   Patient agreed to services and verbal consent obtained.   Assessment: Review of patient past medical history, allergies, medications, health status, including review of consultants reports, laboratory and other test data, was performed as part of comprehensive evaluation and provision of chronic care management services.   SDOH (Social Determinants of Health) assessments and interventions performed:    CCM Care Plan  Allergies  Allergen Reactions   Neosporin [Neomycin-Polymyxin-Gramicidin] Other (See Comments)    blisters   Tape Swelling    Itching and redness Pt said severe reaction to surgical tape.? latex    Outpatient Encounter Medications as of 06/02/2021  Medication Sig Note   acetaminophen (TYLENOL) 325 MG tablet Take 650 mg by mouth every 6 (six) hours as needed.    acyclovir (ZOVIRAX) 800 MG tablet TAKE 1 TABLET BY MOUTH 3 TIMES DAILY FOR 3 DAYS FLARES 05/27/2014: Received from: External Pharmacy   albuterol (VENTOLIN HFA) 108 (90 Base) MCG/ACT inhaler INHALE 1 TO 2 PUFFS EVERY 4 TO 6 HOURS AS NEEDED FOR WHEEZE OR COUGH    amoxicillin (AMOXIL) 500 MG capsule Take 500 mg by mouth as directed. 07/22/2020: Patient states this is for dental appointment.    amoxicillin-clavulanate  (AUGMENTIN) 875-125 MG tablet Take 1 tablet by mouth 2 (two) times daily. (Patient not taking: Reported on 03/17/2021)    azelastine (ASTELIN) 0.1 % nasal spray Place 1 spray into both nostrils 2 (two) times daily.    benzonatate (TESSALON) 200 MG capsule Take 1 capsule (200 mg total) by mouth 3 (three) times daily as needed for cough. Swallow pill whole, do not bite pill (Patient not taking: Reported on 03/17/2021)    Cholecalciferol (VITAMIN D3) 25 MCG (1000 UT) CAPS Take by mouth.    Docusate Calcium (STOOL SOFTENER PO) Take 1 tablet by mouth daily as needed.    esomeprazole (NEXIUM) 40 MG capsule TAKE 1 CAPSULE BY MOUTH EVERY DAY BEFORE BREAKFAST    estradiol (VIVELLE-DOT) 0.0375 MG/24HR PLACE 1 PATCH ONTO THE SKIN 2 (TWO) TIMES A WEEK.    FLOVENT HFA 110 MCG/ACT inhaler Inhale 2 puffs into the lungs in the morning and at bedtime.    Glucosamine-Chondroitin 750-600 MG TABS Take 1 tablet by mouth 2 (two) times daily.      lisinopril (ZESTRIL) 5 MG tablet TAKE 1 TABLET BY MOUTH EVERY DAY    montelukast (SINGULAIR) 10 MG tablet Take 10 mg by mouth daily.     Multiple Vitamin (MULTIVITAMIN) tablet Take 1 tablet by mouth daily.      potassium chloride SA (KLOR-CON M20) 20 MEQ tablet TAKE 1 TABLET BY MOUTH EVERY DAY    predniSONE (DELTASONE) 10 MG tablet Take 4 pills once daily by mouth for 3 days, then 3 pills daily for 3 days, then 2 pills daily for 3 days then  1 pill daily for 3 days then stop (Patient not taking: Reported on 03/17/2021)    promethazine-dextromethorphan (PROMETHAZINE-DM) 6.25-15 MG/5ML syrup Take 5 mLs by mouth 3 (three) times daily as needed for cough. Caution of sedation (Patient not taking: Reported on 03/17/2021)    SYNTHROID 112 MCG tablet Take 112 mcg by mouth daily.    tiZANidine (ZANAFLEX) 4 MG tablet TAKE 1 TABLET BY MOUTH EVERY 6 HOURS AS NEEDED    traMADol (ULTRAM) 50 MG tablet TAKE 1-2 TABLETS BY MOUTH EVERY 8 HOURS AS NEEDED FOR SEVERE(SHOULDER PAIN). CAUTION OF SEDATION     triamterene-hydrochlorothiazide (MAXZIDE-25) 37.5-25 MG tablet TAKE 2 TABLETS BY MOUTH EVERY DAY    verapamil (CALAN-SR) 240 MG CR tablet TAKE 1 TABLET (240 MG TOTAL) BY MOUTH 2 (TWO) TIMES DAILY.    No facility-administered encounter medications on file as of 06/02/2021.    Patient Active Problem List   Diagnosis Date Noted   Acute bronchitis 01/21/2021   Routine general medical examination at a health care facility 07/29/2019   Genetic testing 01/02/2018   Family history of breast cancer    Family history of genetic disease carrier    Meningioma ([place]) 07/17/2017   Fall 01/04/2017   Vasomotor symptoms due to menopause 06/26/2016   Atrophic vaginitis 09/01/2015   Pedal edema 05/27/2014   Estrogen deficiency 02/21/2013   Encounter for Medicare annual wellness exam 02/21/2013   Low back pain potentially associated with radiculopathy 10/11/2012   ALLERGIC RHINITIS 01/28/2010   Prediabetes 07/30/2009   ANXIETY 11/30/2008   Hyperlipidemia 04/13/2008   Hypothyroidism 01/07/2008   HYPERTENSION, BENIGN 01/07/2008   OSTEOARTHRITIS, GENERALIZED 01/07/2008    Conditions to be addressed/monitored:HTN and HLD  Care Plan : [facility] plan of care  Updates made by Dannielle Karvonen, RN since 06/02/2021 12:00 AM     Problem: Chronic Disease management education and/ or coordination needs ( HTN, HLD)   Priority: High     Long-Range Goal: Development of plan of care to address chronic disease management and /or care coordination needs ( HTN, HLD)   Start Date: 11/30/2020  Expected End Date: 07/15/2021  Priority: High  Note:   Current Barriers:  Goals met Knowledge Deficits related to plan of care for management of HTN and HLD  Chronic Disease Management support and education needs related to HTN and HLD Patient states she is doing well.  Denies any further needs.  Patient verbal agreed to closure of case management services.  RNCM Clinical Goal(s):  Patient will verbalize understanding of plan  for management of HTN and HLD take all medications exactly as prescribed and will call provider for medication related questions attend all scheduled medical appointments:  continue to work with RN Care Manager to address care management and care coordination needs related to  HTN and HLD through collaboration with RN Care manager, provider, and care team.   Interventions: 1:1 collaboration with primary care provider regarding development and update of comprehensive plan of care as evidenced by provider attestation and co-signature Inter-disciplinary care team collaboration (see longitudinal plan of care) Evaluation of current treatment plan related to  self management and patient's adherence to plan as established by provider  Hypertension Interventions: Status: Goals Met Last practice recorded BP readings:  BP Readings from Last 3 Encounters:  08/10/20 128/64  11/12/19 128/76  07/29/19 128/60  Most recent eGFR/CrCl: No results found for: EGFR  No components found for: CRCL  Evaluation of current treatment plan related to hypertension self management and patient's  adherence to plan as established by provider; Reviewed medications with patient and discussed importance of compliance; Discussed plans with patient for ongoing care management follow up and provided patient with direct contact information for care management team; Reviewed scheduled/upcoming provider appointments Advised patient to call her insurance company for possible coverage of blood pressure monitor Discussed importance of monitoring blood pressure   Hyperlipidemia Interventions: Status: Goals Met Medication review performed; medication list updated in electronic medical record.  Reviewed importance of limiting foods high in cholesterol;  Advised patient to follow up with providers as recommended.  Advised patient to continue to follow a heart healthy, low salt diet.   Patient Goals/Self-Care Activities: - Call  insurance company for possible coverage of blood pressure monitor.  - Continue to take all medications exactly as prescribed - call doctor when you experience any new symptoms - Follow up with your providers as recommended. Continue to follow a heart healthy, low salt diet Increase your activity with exercise if you are able and ok with your doctor.        Plan: No further follow up.   Quinn Plowman RN,BSN,CCM RN Case Manager [place]  [phone]

## 2021-06-06 ENCOUNTER — Ambulatory Visit (INDEPENDENT_AMBULATORY_CARE_PROVIDER_SITE_OTHER): Payer: PPO | Admitting: Family Medicine

## 2021-06-06 VITALS — BP 120/60 | HR 59 | Temp 98.1°F | Ht 67.5 in | Wt 235.1 lb

## 2021-06-06 DIAGNOSIS — B349 Viral infection, unspecified: Secondary | ICD-10-CM

## 2021-06-06 MED ORDER — DOXYCYCLINE HYCLATE 100 MG PO TABS: 100.0000 mg | ORAL_TABLET | Freq: Two times a day (BID) | ORAL | 0 refills | Status: DC

## 2021-06-06 NOTE — Progress Notes (Unsigned)
Amber Mcilhenny T. Berle Fitz, MD, [name], [address]  Phone: [phone]  FAX: [name] - 76 y.o. female  MRN [id number]  Date of Birth: 07-Feb-1945  Date: 06/06/2021  PCP: [name], MD  Referral: [name], MD  Chief Complaint  Patient presents with   Laryngitis   Cough    With Yellow Phlegm-Started on 06/03/21 Negative Home Covid Test today around noon   Subjective:   Amber Miles is a 76 y.o. very pleasant female patient with Body mass index is 36.27 kg/m. who presents with the following:  She presents with 3 to 4 days of some laryngitis, sore throat, and her cough is progressed over the last few days.  Now it is productive of some yellowish-green sputum.  She does have a history of some significant asthma, right now she is not wheezing.  She does take Singulair at baseline for active maintenance, but she has not been using her Ventolin inhaler.  Friday, got up and could not talk. Will get laryngitis.  Coughed every since  Has had asthma since a child  Takes Sinulair for asthma.  Lungs were clear a couple of weeks ago.  Throat and in chest  Taking some mucinex Using some cough drops Delsym.     Review of Systems is noted in the HPI, as appropriate  Objective:   BP 120/60   Pulse (!) 59   Temp 98.1 F (36.7 C) (Oral)   Ht 5' 7.5" (1.715 m)   Wt 235 lb 1 oz (106.6 kg)   SpO2 97%   BMI 36.27 kg/m    Gen: WDWN, NAD. Globally Non-toxic HEENT: Throat clear, w/o exudate, R TM clear, L TM - good landmarks, No fluid present. rhinnorhea.  MMM Frontal sinuses: NT Max sinuses: NT NECK: Anterior cervical  LAD is absent CV: RRR, No M/G/R, cap refill <2 sec PULM: Breathing comfortably in no respiratory distress. no wheezing, crackles, rhonchi   Laboratory and Imaging Data:  Assessment and Plan:     ICD-10-CM   1. Viral syndrome  B34.9      It  sounds as if she has some viral laryngitis that hopefully is improving.  She does have a productive cough.  We reviewed the nature of laryngitis and is typically a viral infection.  I anticipation this will improve just with supportive care.  For now I encouraged supportive care, and some ongoing mucolytic's and I think this would be reasonable and appropriate.  Tylenol if needed as well.  If she is not improving by later in the week, I think it is reasonable to fill her doxycycline.  Certainly if she develops some fever or develop some wheezing.  Albuterol for wheezing.  Medication Management during today's office visit: Meds ordered this encounter  Medications   doxycycline (VIBRA-TABS) 100 MG tablet    Sig: Take 1 tablet (100 mg total) by mouth 2 (two) times daily.    Dispense:  20 tablet    Refill:  0   Medications Discontinued During This Encounter  Medication Reason   acetaminophen (TYLENOL) 325 MG tablet Completed Course   amoxicillin-clavulanate (AUGMENTIN) 875-125 MG tablet Completed Course   Cholecalciferol (VITAMIN D3) 25 MCG (1000 UT) CAPS Completed Course   benzonatate (TESSALON) 200 MG capsule Completed Course   predniSONE (DELTASONE) 10 MG tablet Completed Course   promethazine-dextromethorphan (PROMETHAZINE-DM) 6.25-15 MG/5ML syrup Completed Course  Orders placed today for conditions managed today: No orders of the defined types were placed in this encounter.   Follow-up if needed: No follow-ups on file.  [employer] One speech-to-text software was used for transcription in this dictation.  Possible transcriptional errors can occur using Editor, commissioning.   Signed,  [name]. Karole Oo, MD   Outpatient Encounter Medications as of 06/06/2021  Medication Sig   acyclovir (ZOVIRAX) 800 MG tablet TAKE 1 TABLET BY MOUTH 3 TIMES DAILY FOR 3 DAYS FLARES   albuterol (VENTOLIN HFA) 108 (90 Base) MCG/ACT inhaler INHALE 1 TO 2 PUFFS EVERY 4 TO 6 HOURS AS NEEDED FOR  WHEEZE OR COUGH   amoxicillin (AMOXIL) 500 MG capsule Take 500 mg by mouth as directed.   azelastine (ASTELIN) 0.1 % nasal spray Place 1 spray into both nostrils 2 (two) times daily.   Docusate Calcium (STOOL SOFTENER PO) Take 3 tablets by mouth daily as needed.   doxycycline (VIBRA-TABS) 100 MG tablet Take 1 tablet (100 mg total) by mouth 2 (two) times daily.   esomeprazole (NEXIUM) 40 MG capsule TAKE 1 CAPSULE BY MOUTH EVERY DAY BEFORE BREAKFAST   estradiol (VIVELLE-DOT) 0.0375 MG/24HR PLACE 1 PATCH ONTO THE SKIN 2 (TWO) TIMES A WEEK.   FLOVENT HFA 110 MCG/ACT inhaler Inhale 2 puffs into the lungs in the morning and at bedtime.   Glucosamine-Chondroitin 750-600 MG TABS Take 1 tablet by mouth 2 (two) times daily.     lisinopril (ZESTRIL) 5 MG tablet TAKE 1 TABLET BY MOUTH EVERY DAY   montelukast (SINGULAIR) 10 MG tablet Take 10 mg by mouth daily.    Multiple Vitamin (MULTIVITAMIN) tablet Take 1 tablet by mouth daily.     naproxen sodium (ALEVE) 220 MG tablet Take 220 mg by mouth every 12 (twelve) hours.   potassium chloride SA (KLOR-CON M20) 20 MEQ tablet TAKE 1 TABLET BY MOUTH EVERY DAY   SYNTHROID 112 MCG tablet Take 112 mcg by mouth daily.   tiZANidine (ZANAFLEX) 4 MG tablet TAKE 1 TABLET BY MOUTH EVERY 6 HOURS AS NEEDED   traMADol (ULTRAM) 50 MG tablet TAKE 1-2 TABLETS BY MOUTH EVERY 8 HOURS AS NEEDED FOR SEVERE(SHOULDER PAIN). CAUTION OF SEDATION   triamterene-hydrochlorothiazide (MAXZIDE-25) 37.5-25 MG tablet TAKE 2 TABLETS BY MOUTH EVERY DAY   verapamil (CALAN-SR) 240 MG CR tablet TAKE 1 TABLET (240 MG TOTAL) BY MOUTH 2 (TWO) TIMES DAILY.   [DISCONTINUED] acetaminophen (TYLENOL) 325 MG tablet Take 650 mg by mouth every 6 (six) hours as needed.   [DISCONTINUED] amoxicillin-clavulanate (AUGMENTIN) 875-125 MG tablet Take 1 tablet by mouth 2 (two) times daily. (Patient not taking: Reported on 03/17/2021)   [DISCONTINUED] benzonatate (TESSALON) 200 MG capsule Take 1 capsule (200 mg total) by  mouth 3 (three) times daily as needed for cough. Swallow pill whole, do not bite pill (Patient not taking: Reported on 03/17/2021)   [DISCONTINUED] Cholecalciferol (VITAMIN D3) 25 MCG (1000 UT) CAPS Take by mouth.   [DISCONTINUED] predniSONE (DELTASONE) 10 MG tablet Take 4 pills once daily by mouth for 3 days, then 3 pills daily for 3 days, then 2 pills daily for 3 days then 1 pill daily for 3 days then stop (Patient not taking: Reported on 03/17/2021)   [DISCONTINUED] promethazine-dextromethorphan (PROMETHAZINE-DM) 6.25-15 MG/5ML syrup Take 5 mLs by mouth 3 (three) times daily as needed for cough. Caution of sedation (Patient not taking: Reported on 03/17/2021)   No facility-administered encounter medications on file as of 06/06/2021.

## 2021-06-08 ENCOUNTER — Encounter: Payer: Self-pay | Admitting: Family Medicine

## 2021-06-15 DIAGNOSIS — I10 Essential (primary) hypertension: Secondary | ICD-10-CM

## 2021-06-15 DIAGNOSIS — E785 Hyperlipidemia, unspecified: Secondary | ICD-10-CM | POA: Diagnosis not present

## 2021-06-21 ENCOUNTER — Other Ambulatory Visit: Payer: Self-pay | Admitting: Family Medicine

## 2021-06-21 ENCOUNTER — Ambulatory Visit
Admission: RE | Admit: 2021-06-21 | Discharge: 2021-06-21 | Disposition: A | Discharge status: Home / Self Care | Payer: PPO | Source: Ambulatory Visit | Attending: Family Medicine | Admitting: Family Medicine

## 2021-06-21 DIAGNOSIS — N6489 Other specified disorders of breast: Secondary | ICD-10-CM

## 2021-06-21 DIAGNOSIS — N6012 Diffuse cystic mastopathy of left breast: Secondary | ICD-10-CM | POA: Diagnosis not present

## 2021-06-21 DIAGNOSIS — R928 Other abnormal and inconclusive findings on diagnostic imaging of breast: Secondary | ICD-10-CM | POA: Diagnosis not present

## 2021-07-10 ENCOUNTER — Other Ambulatory Visit: Payer: Self-pay | Admitting: Family Medicine

## 2021-07-15 ENCOUNTER — Other Ambulatory Visit: Payer: Self-pay | Admitting: Family Medicine

## 2021-07-18 NOTE — Telephone Encounter (Signed)
Last filled 04-14-21 #[id number] Last OV 06-06-21 No Future OV  [id number] [place], [place] - [address]

## 2021-07-26 ENCOUNTER — Other Ambulatory Visit: Payer: Self-pay | Admitting: Neurosurgery

## 2021-07-26 DIAGNOSIS — D320 Benign neoplasm of cerebral meninges: Secondary | ICD-10-CM

## 2021-08-04 ENCOUNTER — Ambulatory Visit (INDEPENDENT_AMBULATORY_CARE_PROVIDER_SITE_OTHER): Payer: PPO

## 2021-08-04 VITALS — Ht 67.5 in | Wt 235.0 lb

## 2021-08-04 DIAGNOSIS — Z0000 Encounter for general adult medical examination without abnormal findings: Secondary | ICD-10-CM | POA: Diagnosis not present

## 2021-08-04 NOTE — Patient Instructions (Addendum)
Amber Miles , Thank you for taking time to come for your Medicare Wellness Visit. I appreciate your ongoing commitment to your health goals. Please review the following plan we discussed and let me know if I can assist you in the future.   These are the goals we discussed:  Goals       Patient Stated      Starting 07/15/2018, I will continue to take medications as prescribed.       Patient Stated      07/22/2019, I will maintain and continue medications as prescribed.       Patient Stated      08/03/2020, I will maintain and continue medications as prescribed.      Stay healthy (pt-stated)        This is a list of the screening recommended for you and due dates:  Health Maintenance  Topic Date Due   COVID-19 Vaccine (6 - Pfizer series) 08/20/2021*   Flu Shot  08/16/2021   Mammogram  12/01/2021   Tetanus Vaccine  12/03/2021   Colon Cancer Screening  10/14/2025   Pneumonia Vaccine  Completed   DEXA scan (bone density measurement)  Completed   Hepatitis C Screening: USPSTF Recommendation to screen - Ages 57-79 yo.  Completed   Zoster (Shingles) Vaccine  Completed   HPV Vaccine  Aged Out  *Topic was postponed. The date shown is not the original due date.    Advanced directives: Yes  Conditions/risks identified: None  Next appointment: Follow up in one year for your annual wellness visit     Preventive Care 65 Years and Older, Female Preventive care refers to lifestyle choices and visits with your health care provider that can promote health and wellness. What does preventive care include? A yearly physical exam. This is also called an annual well check. Dental exams once or twice a year. Routine eye exams. Ask your health care provider how often you should have your eyes checked. Personal lifestyle choices, including: Daily care of your teeth and gums. Regular physical activity. Eating a healthy diet. Avoiding tobacco and drug use. Limiting alcohol use. Practicing safe  sex. Taking low-dose aspirin every day. Taking vitamin and mineral supplements as recommended by your health care provider. What happens during an annual well check? The services and screenings done by your health care provider during your annual well check will depend on your age, overall health, lifestyle risk factors, and family history of disease. Counseling  Your health care provider may ask you questions about your: Alcohol use. Tobacco use. Drug use. Emotional well-being. Home and relationship well-being. Sexual activity. Eating habits. History of falls. Memory and ability to understand (cognition). Work and work Statistician. Reproductive health. Screening  You may have the following tests or measurements: Height, weight, and BMI. Blood pressure. Lipid and cholesterol levels. These may be checked every 5 years, or more frequently if you are over 44 years old. Skin check. Lung cancer screening. You may have this screening every year starting at age 92 if you have a 30-pack-year history of smoking and currently smoke or have quit within the past 15 years. Fecal occult blood test (FOBT) of the stool. You may have this test every year starting at age 2. Flexible sigmoidoscopy or colonoscopy. You may have a sigmoidoscopy every 5 years or a colonoscopy every 10 years starting at age 52. Hepatitis C blood test. Hepatitis B blood test. Sexually transmitted disease (STD) testing. Diabetes screening. This is done by checking your blood  sugar (glucose) after you have not eaten for a while (fasting). You may have this done every 1-3 years. Bone density scan. This is done to screen for osteoporosis. You may have this done starting at age 63. Mammogram. This may be done every 1-2 years. Talk to your health care provider about how often you should have regular mammograms. Talk with your health care provider about your test results, treatment options, and if necessary, the need for more  tests. Vaccines  Your health care provider may recommend certain vaccines, such as: Influenza vaccine. This is recommended every year. Tetanus, diphtheria, and acellular pertussis (Tdap, Td) vaccine. You may need a Td booster every 10 years. Zoster vaccine. You may need this after age 44. Pneumococcal 13-valent conjugate (PCV13) vaccine. One dose is recommended after age 75. Pneumococcal polysaccharide (PPSV23) vaccine. One dose is recommended after age 42. Talk to your health care provider about which screenings and vaccines you need and how often you need them. This information is not intended to replace advice given to you by your health care provider. Make sure you discuss any questions you have with your health care provider. Document Released: 01/29/2015 Document Revised: 09/22/2015 Document Reviewed: 11/03/2014 Elsevier Interactive Patient Education  2017 [place] Prevention in the Home Falls can cause injuries. They can happen to people of all ages. There are many things you can do to make your home safe and to help prevent falls. What can I do on the outside of my home? Regularly fix the edges of walkways and driveways and fix any cracks. Remove anything that might make you trip as you walk through a door, such as a raised step or threshold. Trim any bushes or trees on the path to your home. Use bright outdoor lighting. Clear any walking paths of anything that might make someone trip, such as rocks or tools. Regularly check to see if handrails are loose or broken. Make sure that both sides of any steps have handrails. Any raised decks and porches should have guardrails on the edges. Have any leaves, snow, or ice cleared regularly. Use sand or salt on walking paths during winter. Clean up any spills in your garage right away. This includes oil or grease spills. What can I do in the bathroom? Use night lights. Install grab bars by the toilet and in the tub and shower.  Do not use towel bars as grab bars. Use non-skid mats or decals in the tub or shower. If you need to sit down in the shower, use a plastic, non-slip stool. Keep the floor dry. Clean up any water that spills on the floor as soon as it happens. Remove soap buildup in the tub or shower regularly. Attach bath mats securely with double-sided non-slip rug tape. Do not have throw rugs and other things on the floor that can make you trip. What can I do in the bedroom? Use night lights. Make sure that you have a light by your bed that is easy to reach. Do not use any sheets or blankets that are too big for your bed. They should not hang down onto the floor. Have a firm chair that has side arms. You can use this for support while you get dressed. Do not have throw rugs and other things on the floor that can make you trip. What can I do in the kitchen? Clean up any spills right away. Avoid walking on wet floors. Keep items that you use a lot in easy-to-reach  places. If you need to reach something above you, use a strong step stool that has a grab bar. Keep electrical cords out of the way. Do not use floor polish or wax that makes floors slippery. If you must use wax, use non-skid floor wax. Do not have throw rugs and other things on the floor that can make you trip. What can I do with my stairs? Do not leave any items on the stairs. Make sure that there are handrails on both sides of the stairs and use them. Fix handrails that are broken or loose. Make sure that handrails are as long as the stairways. Check any carpeting to make sure that it is firmly attached to the stairs. Fix any carpet that is loose or worn. Avoid having throw rugs at the top or bottom of the stairs. If you do have throw rugs, attach them to the floor with carpet tape. Make sure that you have a light switch at the top of the stairs and the bottom of the stairs. If you do not have them, ask someone to add them for you. What else  can I do to help prevent falls? Wear shoes that: Do not have high heels. Have rubber bottoms. Are comfortable and fit you well. Are closed at the toe. Do not wear sandals. If you use a stepladder: Make sure that it is fully opened. Do not climb a closed stepladder. Make sure that both sides of the stepladder are locked into place. Ask someone to hold it for you, if possible. Clearly mark and make sure that you can see: Any grab bars or handrails. First and last steps. Where the edge of each step is. Use tools that help you move around (mobility aids) if they are needed. These include: Canes. Walkers. Scooters. Crutches. Turn on the lights when you go into a dark area. Replace any light bulbs as soon as they burn out. Set up your furniture so you have a clear path. Avoid moving your furniture around. If any of your floors are uneven, fix them. If there are any pets around you, be aware of where they are. Review your medicines with your doctor. Some medicines can make you feel dizzy. This can increase your chance of falling. Ask your doctor what other things that you can do to help prevent falls. This information is not intended to replace advice given to you by your health care provider. Make sure you discuss any questions you have with your health care provider. Document Released: 10/29/2008 Document Revised: 06/10/2015 Document Reviewed: 02/06/2014 Elsevier Interactive Patient Education  2017 [employer].

## 2021-08-04 NOTE — Progress Notes (Signed)
Subjective:   Amber Miles is a 76 y.o. female who presents for Medicare Annual (Subsequent) preventive examination.  Review of Systems    Virtual Visit via Telephone Note  I connected with  Amber Miles on 08/04/21 at  2:00 PM EDT by telephone and verified that I am speaking with the correct person using two identifiers.  Location: Patient: Home Provider: Office Persons participating in the virtual visit: patient/Nurse Health Advisor   I discussed the limitations, risks, security and privacy concerns of performing an evaluation and management service by telephone and the availability of in person appointments. The patient expressed understanding and agreed to proceed.  Interactive audio and video telecommunications were attempted between this nurse and patient, however failed, due to patient having technical difficulties OR patient did not have access to video capability.  We continued and completed visit with audio only.  Some vital signs may be absent or patient reported.   Criselda Peaches, LPN  Cardiac Risk Factors include: advanced age (>44mn, >>68women);hypertension     Objective:    Today's Vitals   08/04/21 1405  Weight: 235 lb (106.6 kg)  Height: 5' 7.5" (1.715 m)   Body mass index is 36.26 kg/m.     08/04/2021    2:14 PM 08/03/2020    1:20 PM 07/22/2020    2:08 PM 12/23/2019    4:39 PM 07/22/2019    2:45 PM 07/15/2018   11:05 AM 07/02/2017    9:08 AM  Advanced Directives  Does Patient Have a Medical Advance Directive? Yes Yes Yes Yes Yes Yes Yes  Type of AParamedicof A[place]orntownLiving will HF[place]Living will HNa[place]Living will HAjoLiving will HLy[place]Living will Living will;Healthcare Power of AGladstoneLiving will  Does patient want to make changes to medical advance directive? No - Patient declined  No - Patient declined No  - Patient declined  No - Patient declined   Copy of HCashiersin Chart? No - copy requested No - copy requested   No - copy requested No - copy requested No - copy requested    Current Medications (verified) Outpatient Encounter Medications as of 08/04/2021  Medication Sig   acyclovir (ZOVIRAX) 800 MG tablet TAKE 1 TABLET BY MOUTH 3 TIMES DAILY FOR 3 DAYS FLARES   albuterol (VENTOLIN HFA) 108 (90 Base) MCG/ACT inhaler INHALE 1 TO 2 PUFFS EVERY 4 TO 6 HOURS AS NEEDED FOR WHEEZE OR COUGH   amoxicillin (AMOXIL) 500 MG capsule Take 500 mg by mouth as directed.   azelastine (ASTELIN) 0.1 % nasal spray Place 1 spray into both nostrils 2 (two) times daily.   Docusate Calcium (STOOL SOFTENER PO) Take 3 tablets by mouth daily as needed.   doxycycline (VIBRA-TABS) 100 MG tablet Take 1 tablet (100 mg total) by mouth 2 (two) times daily.   esomeprazole (NEXIUM) 40 MG capsule TAKE 1 CAPSULE BY MOUTH EVERY DAY BEFORE BREAKFAST   estradiol (VIVELLE-DOT) 0.0375 MG/24HR PLACE 1 PATCH ONTO THE SKIN 2 (TWO) TIMES A WEEK.   FLOVENT HFA 110 MCG/ACT inhaler Inhale 2 puffs into the lungs in the morning and at bedtime.   Glucosamine-Chondroitin 750-600 MG TABS Take 1 tablet by mouth 2 (two) times daily.     lisinopril (ZESTRIL) 5 MG tablet TAKE 1 TABLET BY MOUTH EVERY DAY   montelukast (SINGULAIR) 10 MG tablet Take 10 mg by mouth daily.  Multiple Vitamin (MULTIVITAMIN) tablet Take 1 tablet by mouth daily.     naproxen sodium (ALEVE) 220 MG tablet Take 220 mg by mouth every 12 (twelve) hours.   potassium chloride SA (KLOR-CON M20) 20 MEQ tablet TAKE 1 TABLET BY MOUTH EVERY DAY   SYNTHROID 112 MCG tablet Take 112 mcg by mouth daily.   tiZANidine (ZANAFLEX) 4 MG tablet TAKE 1 TABLET BY MOUTH EVERY 6 HOURS AS NEEDED   traMADol (ULTRAM) 50 MG tablet TAKE 1-2 TABLETS BY MOUTH EVERY 8 HOURS AS NEEDED FOR SEVERE(SHOULDER PAIN). CAUTION OF SEDATION   triamterene-hydrochlorothiazide (MAXZIDE-25) 37.5-25  MG tablet TAKE 2 TABLETS BY MOUTH EVERY DAY   verapamil (CALAN-SR) 240 MG CR tablet TAKE 1 TABLET (240 MG TOTAL) BY MOUTH 2 (TWO) TIMES DAILY.   No facility-administered encounter medications on file as of 08/04/2021.    Allergies (verified) Neosporin [neomycin-polymyxin-gramicidin] and Tape   History: Past Medical History:  Diagnosis Date   Allergy    allergic rhinitis   Anxiety    Arthritis    OA knee replacement   Cataract    Degenerative disc disease, thoracic    Family history of breast cancer    Family history of genetic disease carrier    paternal cousin has NBN mutation   GERD (gastroesophageal reflux disease)    Hyperlipidemia    Hypertension    Hypothyroid    Reactive airways dysfunction syndrome (HCC)    Skin lesions, generalized    squamous cell skin lesions and basal cell skin lesions   Past Surgical History:  Procedure Laterality Date   ABDOMINAL HYSTERECTOMY  1990s   hyst with oophrectomy for B9lesion/cyst and prolapse   BREAST BIOPSY Bilateral    benign   JOINT REPLACEMENT  05/2009   left total knee replacement   recocele     and cystocele repair   TONSILLECTOMY     tsa     Family History  Problem Relation Age of Onset   Hypertension Mother    Stroke Mother    Depression Mother    Alzheimer's disease Mother    Heart disease Father    Cerebral palsy Father    Hypertension Sister    Breast cancer Cousin 45       NBN mutation   Melanoma Maternal Uncle        dx >50   Social History   Socioeconomic History   Marital status: Widowed    Spouse name: Not on file   Number of children: Not on file   Years of education: Not on file   Highest education level: Not on file  Occupational History   Not on file  Tobacco Use   Smoking status: Never   Smokeless tobacco: Never  Vaping Use   Vaping Use: Never used  Substance and Sexual Activity   Alcohol use: No    Alcohol/week: 0.0 standard drinks of alcohol   Drug use: No   Sexual activity: Not  Currently  Other Topics Concern   Not on file  Social History Narrative   Not on file   Social Determinants of Health   Financial Resource Strain: Low Risk  (08/04/2021)   Overall Financial Resource Strain (CARDIA)    Difficulty of Paying Living Expenses: Not hard at all  Food Insecurity: No Food Insecurity (08/04/2021)   Hunger Vital Sign    Worried About Running Out of Food in the Last Year: Never true    [place] in the Last Year: Never  true  Transportation Needs: No Transportation Needs (08/04/2021)   PRAPARE - Hydrologist (Medical): No    Lack of Transportation (Non-Medical): No  Physical Activity: Sufficiently Active (08/04/2021)   Exercise Vital Sign    Days of Exercise per Week: 7 days    Minutes of Exercise per Session: 30 min  Stress: No Stress Concern Present (08/04/2021)   [place]    Feeling of Stress : Not at all  Social Connections: Moderately Integrated (08/04/2021)   Social Connection and Isolation Panel [NHANES]    Frequency of Communication with Friends and Family: More than three times a week    Frequency of Social Gatherings with Friends and Family: More than three times a week    Attends Religious Services: More than 4 times per year    Active Member of [employer] or Organizations: Yes    Attends Archivist Meetings: More than 4 times per year    Marital Status: Widowed    Tobacco Counseling Counseling given: Not Answered   Clinical Intake:  Pre-visit preparation completed: NoDiabetic?  No  Interpreter Needed?: NoActivities of Daily Living    08/04/2021    2:12 PM  In your present state of health, do you have any difficulty performing the following activities:  Hearing? 0  Vision? 0  Difficulty concentrating or making decisions? 0  Walking or climbing stairs? 0  Dressing or bathing? 0  Doing errands, shopping? 0  Preparing Food and eating ?  N  Using the Toilet? N  In the past six months, have you accidently leaked urine? N  Do you have problems with loss of bowel control? N  Managing your Medications? N  Managing your Finances? N  Housekeeping or managing your Housekeeping? N    Patient Care Team: Tower, Wynelle Fanny, MD as PCP - Silvano Rusk, MD as Consulting Physician (Ophthalmology)  Indicate any recent Medical Services you may have received from other than [facility] providers in the past year (date may be approximate).     Assessment:   This is a routine wellness examination for [place].  Hearing/Vision screen Hearing Screening - Comments:: No hearing difficulty Vision Screening - Comments:: Wears glasses. Followed by Coralie Keens  Dietary issues and exercise activities discussed: Exercise limited by: None identified   Goals Addressed               This Visit's Progress     Stay healthy (pt-stated)         Depression Screen    08/04/2021    2:10 PM 08/03/2020    1:23 PM 07/22/2020    2:17 PM 07/22/2019    2:47 PM 07/15/2018   11:12 AM 07/02/2017    9:10 AM 06/23/2016    9:39 AM  PHQ 2/9 Scores  PHQ - 2 Score 0 0 0 0 0 0 0  PHQ- 9 Score  0  0 0 0     Fall Risk    08/04/2021    2:13 PM 08/03/2020    1:23 PM 07/22/2020    2:16 PM 07/22/2019    2:47 PM 07/15/2018   11:12 AM  [place] in the past year? 0 1 1 0 0  Number falls in past yr: 0 0 0 0   Injury with Fall? 0 0 0 0   Risk for fall due to : No Fall Risks Medication side effect History of fall(s) Medication  side effect   Follow up  Falls evaluation completed;Falls prevention discussed Falls prevention discussed Falls evaluation completed;Falls prevention discussed     FALL RISK PREVENTION PERTAINING TO THE HOME:  Any stairs in or around the home? No If so, are there any without handrails? Yes  Home free of loose throw rugs in walkways, pet beds, electrical cords, etc? Yes  Adequate lighting in your home to reduce risk of falls?  Yes   ASSISTIVE DEVICES UTILIZED TO PREVENT FALLS:  Life alert? Yes  Use of a cane, walker or w/c? No  Grab bars in the bathroom? No  Shower chair or bench in shower? No  Elevated toilet seat or a handicapped toilet? No   TIMED UP AND GO:  Was the test performed? No . Audio Visit  Cognitive Function:    08/03/2020    1:25 PM 07/22/2019    2:49 PM 07/15/2018   11:12 AM 07/02/2017    9:09 AM 06/23/2016    9:39 AM  MMSE - Mini Mental State Exam  Orientation to time '5 5 5 5 5  '$ Orientation to Place '5 5 5 5 5  '$ Registration '3 3 3 3 3  '$ Attention/ Calculation 5 5 0 0 0  Recall '3 3 3 3 3  '$ Language- name 2 objects   0 0 0  Language- repeat '1 1 1 1 1  '$ Language- follow 3 step command   0 3 3  Language- read & follow direction   0 0 0  Write a sentence   0 0 0  Copy design   0 0 0  Total score   '17 20 20        '$ 08/04/2021    2:15 PM  6CIT Screen  What Year? 0 points  What month? 0 points  What time? 0 points  Count back from 20 0 points  Months in reverse 0 points  Repeat phrase 0 points  Total Score 0 points    Immunizations Immunization History  Administered Date(s) Administered   Fluad Quad(high Dose 65+) 10/09/2019   Influenza Split 10/18/2010   Influenza Whole 11/30/2006, 10/28/2008, 10/27/2009   Influenza, High Dose Seasonal PF 11/11/2015, 10/05/2016, 10/18/2017, 11/11/2018, 10/29/2020   Influenza-Unspecified 10/07/2012, 08/30/2013, 10/01/2014   PFIZER Comirnaty(Gray Top)Covid-19 Tri-Sucrose Vaccine 06/09/2020   PFIZER(Purple Top)SARS-COV-2 Vaccination 02/04/2019, 02/24/2019, 10/20/2019   Pfizer Covid-19 Vaccine Bivalent Booster 44yr & up 11/02/2020   Pneumococcal Conjugate-13 05/27/2014   Pneumococcal Polysaccharide-23 12/04/2011, 10/07/2015, 10/05/2016, 10/04/2017, 05/15/2019, 05/06/2020, 05/12/2021   Td 12/04/2011   Zoster Recombinat (Shingrix) 08/06/2019, 10/09/2019   Zoster, Live 10/05/2010    TDAP status: Up to date  Flu Vaccine status: Up to  date  Pneumococcal vaccine status: Up to date  Covid-19 vaccine status: Completed vaccines  Qualifies for Shingles Vaccine? Yes   Zostavax completed Yes   Shingrix Completed?: Yes  Screening Tests Health Maintenance  Topic Date Due   COVID-19 Vaccine (6 - Pfizer series) 08/20/2021 (Originally 03/05/2021)   INFLUENZA VACCINE  08/16/2021   MAMMOGRAM  12/01/2021   TETANUS/TDAP  12/03/2021   COLONOSCOPY (Pts 45-471yrInsurance coverage will need to be confirmed)  10/14/2025   Pneumonia Vaccine 77Years old  Completed   DEXA SCAN  Completed   Hepatitis C Screening  Completed   Zoster Vaccines- Shingrix  Completed   HPV VACCINES  Aged Out    Health Maintenance  There are no preventive care reminders to display for this patient.   Colorectal cancer screening: Type of screening: Colonoscopy.  Completed 10/14/20. Repeat every 5 years  Mammogram status: Completed 12/11/20. Repeat every year  Bone Density status: Completed 05/19/20. Results reflect: Bone density results: OSTEOPOROSIS. Repeat every   years.  Lung Cancer Screening: (Low Dose CT Chest recommended if Age 78-80 years, 30 pack-year currently smoking OR have quit w/in 15years.)  qualify.     Additional Screening:  Hepatitis C Screening: does qualify; Completed 06/22/15  Vision Screening: Recommended annual ophthalmology exams for early detection of glaucoma and other disorders of the eye. Is the patient up to date with their annual eye exam?  Yes  Who is the provider or what is the name of the office in which the patient attends annual eye exams? [facility] If pt is not established with a provider, would they like to be referred to a provider to establish care? No .   Dental Screening: Recommended annual dental exams for proper oral hygiene  Community Resource Referral / Chronic Care Management:  CRR required this visit?  No   CCM required this visit?  No      Plan:     I have personally reviewed and noted  the following in the patient's chart:   Medical and social history Use of alcohol, tobacco or illicit drugs  Current medications and supplements including opioid prescriptions.  Functional ability and status Nutritional status Physical activity Advanced directives List of other physicians Hospitalizations, surgeries, and ER visits in previous 12 months Vitals Screenings to include cognitive, depression, and falls Referrals and appointments  In addition, I have reviewed and discussed with patient certain preventive protocols, quality metrics, and best practice recommendations. A written personalized care plan for preventive services as well as general preventive health recommendations were provided to patient.     Criselda Peaches, LPN   07/17/6376   Nurse Notes: None

## 2021-08-14 ENCOUNTER — Other Ambulatory Visit: Payer: Self-pay | Admitting: Family Medicine

## 2021-08-22 ENCOUNTER — Ambulatory Visit
Admission: RE | Admit: 2021-08-22 | Discharge: 2021-08-22 | Disposition: A | Discharge status: Home / Self Care | Payer: PPO | Source: Ambulatory Visit | Attending: Neurosurgery | Admitting: Neurosurgery

## 2021-08-22 DIAGNOSIS — D320 Benign neoplasm of cerebral meninges: Secondary | ICD-10-CM | POA: Diagnosis not present

## 2021-08-22 DIAGNOSIS — R220 Localized swelling, mass and lump, head: Secondary | ICD-10-CM | POA: Diagnosis not present

## 2021-08-22 DIAGNOSIS — D329 Benign neoplasm of meninges, unspecified: Secondary | ICD-10-CM | POA: Diagnosis not present

## 2021-08-22 MED ORDER — GADOBUTROL 1 MMOL/ML IV SOLN
10.0000 mL | Freq: Once | INTRAVENOUS | Status: AC | PRN
  Administered 2021-08-22: 10 mL via INTRAVENOUS

## 2021-08-29 DIAGNOSIS — D320 Benign neoplasm of cerebral meninges: Secondary | ICD-10-CM | POA: Diagnosis not present

## 2021-08-29 DIAGNOSIS — Z6837 Body mass index (BMI) 37.0-37.9, adult: Secondary | ICD-10-CM | POA: Diagnosis not present

## 2021-09-06 ENCOUNTER — Other Ambulatory Visit: Payer: Self-pay | Admitting: Family Medicine

## 2021-09-06 NOTE — Telephone Encounter (Signed)
Last filled 04/14/21 Last ov 01/21/21 Next ov 09/08/21

## 2021-09-08 ENCOUNTER — Encounter: Payer: Self-pay | Admitting: Family Medicine

## 2021-09-08 ENCOUNTER — Ambulatory Visit (INDEPENDENT_AMBULATORY_CARE_PROVIDER_SITE_OTHER): Payer: PPO | Admitting: Family Medicine

## 2021-09-08 VITALS — BP 140/64 | HR 68 | Temp 98.2°F | Ht 67.5 in | Wt 236.1 lb

## 2021-09-08 DIAGNOSIS — M5137 Other intervertebral disc degeneration, lumbosacral region: Secondary | ICD-10-CM

## 2021-09-08 DIAGNOSIS — M7061 Trochanteric bursitis, right hip: Secondary | ICD-10-CM

## 2021-09-08 DIAGNOSIS — M19011 Primary osteoarthritis, right shoulder: Secondary | ICD-10-CM | POA: Diagnosis not present

## 2021-09-08 DIAGNOSIS — M51379 Other intervertebral disc degeneration, lumbosacral region without mention of lumbar back pain or lower extremity pain: Secondary | ICD-10-CM

## 2021-09-08 MED ORDER — TRIAMCINOLONE ACETONIDE 40 MG/ML IJ SUSP
40.0000 mg | Freq: Once | INTRAMUSCULAR | Status: AC
  Administered 2021-09-08: 40 mg via INTRA_ARTICULAR

## 2021-09-08 NOTE — Progress Notes (Signed)
Amber Morash T. Annette Bertelson, MD, [place] at [facility] [place], [address]  Phone: [phone]  FAX: [place] - 76 y.o. female  MRN [id number]  Date of Birth: [birth year]  Date: 09/08/2021  PCP: [name], MD  Referral: [name], MD  Chief Complaint  Patient presents with   Shoulder Pain    Right   Hip Pain    Right   Spasms   Subjective:   Amber Miles is a 76 y.o. very pleasant female patient with Body mass index is 36.44 kg/m. who presents with the following:  Very pleasant 76 year old patient, she presents with some ongoing shoulder pain as well as hip pain.  Saw her previously about 6 years ago with some right-sided shoulder pain and impingement.  She did have some mild glenohumeral joint osteoarthritis at that time, she continues to have some ongoing intermittent progressive shoulder pain.  This is present now with some mild restriction of motion and allow crepitus with motion.  There is a deep dull ache, she has not had any specific injury at all.  Muscle spasms in her shoulder blades.  This is a multi decade problem, she has been having issues with some parascapular muscular tenderness and trapezius tenderness ongoing for years.  She currently takes some Zanaflex 3 times a day, recently got #360, and she also takes tramadol as needed.  This is on top of some basic Tylenol and NSAIDs, as well.  She also has diffuse multilevel degenerative disc disease of the lumbosacral spine, and she does already see Dr. Ron Agee, where she receives intermittent epidural steroid injections.  Right now, she has some fairly dramatic pain on the lateral aspect of her hip on the right side without anterior groin pain.  No restriction of motion in the hip.  I did review a plain film from 2017, at that time the joint space was completely preserved.  Review of Systems is noted in the HPI, as  appropriate  Objective:   BP (!) 140/64   Pulse 68   Temp 98.2 F (36.8 C) (Oral)   Ht 5' 7.5" (1.715 m)   Wt 236 lb 2 oz (107.1 kg)   SpO2 94%   BMI 36.44 kg/m   GEN: No acute distress; alert,appropriate. PULM: Breathing comfortably in no respiratory distress PSYCH: Normally interactive.   Right shoulder: Nontender along the clavicle, AC joint, or bicipital groove. She lacks roughly 10 degrees of terminal abduction and flexion and there is loud crepitus and palpable crepitus with movement and in abduction on the right side she does have some elevation of the Hemi scapula greater than on the left comparatively There is tenderness in the trapezius and Spinous musculature bilaterally.  This corresponds to the pain that the patient has been having for decades. Negative Jobe test, Michel Bickers test, and Neer testing. Speeds and Yergason's tests are negative.   HIP EXAM: SIDE: R ROM: Abduction, Flexion, Internal and External range of motion: Full Pain with terminal IROM and EROM: Minimal to none GTB: Fairly dramatic tenderness on the right side SLR: NEG Knees: No effusion FABER: NT REVERSE FABER: NT, neg Piriformis: NT at direct palpation Str: flexion: 4/5 abduction: 4/5 adduction: 4/5 Strength testing non-tender    Laboratory and Imaging Data:  Assessment and Plan:     ICD-10-CM   1. Glenohumeral arthritis, right  M19.011 triamcinolone acetonide (KENALOG-40) injection 40 mg    2. Trochanteric  bursitis of right hip  M70.61 triamcinolone acetonide (KENALOG-40) injection 40 mg    3. DDD (degenerative disc disease), lumbosacral  M51.37      With some known glenohumeral osteoarthritis, and we will would go along with this consistently historically and on exam, strongly suspect glenohumeral osteoarthritic exacerbation, acute on chronic.  She does have loud palpable crepitus on exam.  Known multilevel degenerative disc disease, and this is certainly playing a role.  I  think some inherent weakness is also likely playing a role.  With abnormal movement mechanics, it is quite common to have some various overuse bursitis, and it seems like she has some very significant trochanteric bursitis right now.  I think is reasonable to try to get this to calm down with a direct intrabursal injection.  She is failing all manner of other conservative care including Tylenol, NSAIDs, tramadol, Zanaflex.  Think also intermittent epidural steroid injections, as she is getting is a reasonable and appropriate long-term pain management strategy.  Intraarticular Shoulder Aspiration/Injection Procedure Note Amber Miles 1945-10-26 Date of procedure: 09/08/2021  Procedure: Large Joint Aspiration / Injection of Shoulder, Intraarticular, R Indications: Pain  Procedure Details Verbal consent was obtained from the patient. Risks explained and contrasted with benefits and alternatives. Patient prepped with Chloraprep and Ethyl Chloride used for anesthesia. An intraarticular shoulder injection was performed using the posterior approach; needle placed into joint capsule without difficulty. The patient tolerated the procedure well and had decreased pain post injection. No complications. Injection: 9 cc of Lidocaine 1% and 1 mL Kenalog 40 mg. Needle: 21 gauge, 2 inch   Aspiration/Injection Procedure Note Amber Miles Oct 19, 1945 Date of procedure: 09/08/2021  Procedure: Large Joint Aspiration / Injection of Hip, Trochanteric Bursa, R Indications: Pain  Procedure Details Verbal consent obtained. Risks, benefits, and alternatives reviewed. Greater trochanter sterilely prepped with Chloraprep. Ethyl Chloride used for anesthesia. 9 cc of Lidocaine 1% injected with 1 mL of Kenalog 40 mg into trochanteric bursa at area of maximal tenderness at greater trochanter. Needle taken to bone to troch bursa, flows easily. Bursa massaged. No bleeding and no complications. Decreased pain after  injection. Needle: 22 gauge spinal needle Medication: 1 mL of Kenalog 40 mg   Medication Management during today's office visit: Meds ordered this encounter  Medications   triamcinolone acetonide (KENALOG-40) injection 40 mg   triamcinolone acetonide (KENALOG-40) injection 40 mg   Medications Discontinued During This Encounter  Medication Reason   doxycycline (VIBRA-TABS) 100 MG tablet Completed Course    Orders placed today for conditions managed today: No orders of the defined types were placed in this encounter.   Disposition: No follow-ups on file.  [employer] speech-to-text software was used for transcription in this dictation.  Possible transcriptional errors can occur using Editor, commissioning.   Signed,  [name]. Deysy Schabel, MD   Outpatient Encounter Medications as of 09/08/2021  Medication Sig   acyclovir (ZOVIRAX) 800 MG tablet TAKE 1 TABLET BY MOUTH 3 TIMES DAILY FOR 3 DAYS FLARES   albuterol (VENTOLIN HFA) 108 (90 Base) MCG/ACT inhaler INHALE 1 TO 2 PUFFS EVERY 4 TO 6 HOURS AS NEEDED FOR WHEEZE OR COUGH   amoxicillin (AMOXIL) 500 MG capsule Take 500 mg by mouth as directed.   azelastine (ASTELIN) 0.1 % nasal spray Place 1 spray into both nostrils 2 (two) times daily.   Docusate Calcium (STOOL SOFTENER PO) Take 3 tablets by mouth daily as needed.   esomeprazole (NEXIUM) 40 MG capsule TAKE 1 CAPSULE BY  MOUTH EVERY DAY BEFORE BREAKFAST   estradiol (VIVELLE-DOT) 0.0375 MG/24HR PLACE 1 PATCH ONTO THE SKIN 2 (TWO) TIMES A WEEK.   FLOVENT HFA 110 MCG/ACT inhaler Inhale 2 puffs into the lungs in the morning and at bedtime.   Glucosamine-Chondroitin 750-600 MG TABS Take 1 tablet by mouth 2 (two) times daily.     lisinopril (ZESTRIL) 5 MG tablet TAKE 1 TABLET BY MOUTH EVERY DAY   montelukast (SINGULAIR) 10 MG tablet Take 10 mg by mouth daily.    Multiple Vitamin (MULTIVITAMIN) tablet Take 1 tablet by mouth daily.     naproxen sodium (ALEVE) 220 MG tablet Take 220 mg by  mouth every 12 (twelve) hours.   potassium chloride SA (KLOR-CON M20) 20 MEQ tablet TAKE 1 TABLET BY MOUTH EVERY DAY   SYNTHROID 112 MCG tablet Take 112 mcg by mouth daily.   tiZANidine (ZANAFLEX) 4 MG tablet TAKE 1 TABLET BY MOUTH EVERY 6 HOURS AS NEEDED   traMADol (ULTRAM) 50 MG tablet TAKE 1-2 TABLETS BY MOUTH EVERY 8 HOURS AS NEEDED FOR SEVERE(SHOULDER PAIN). CAUTION OF SEDATION   triamterene-hydrochlorothiazide (MAXZIDE-25) 37.5-25 MG tablet TAKE 2 TABLETS BY MOUTH EVERY DAY   verapamil (CALAN-SR) 240 MG CR tablet TAKE 1 TABLET (240 MG TOTAL) BY MOUTH 2 (TWO) TIMES DAILY.   [DISCONTINUED] doxycycline (VIBRA-TABS) 100 MG tablet Take 1 tablet (100 mg total) by mouth 2 (two) times daily.   [EXPIRED] triamcinolone acetonide (KENALOG-40) injection 40 mg    [EXPIRED] triamcinolone acetonide (KENALOG-40) injection 40 mg    No facility-administered encounter medications on file as of 09/08/2021.

## 2021-09-13 ENCOUNTER — Other Ambulatory Visit: Payer: Self-pay | Admitting: Family Medicine

## 2021-09-14 NOTE — Telephone Encounter (Signed)
Pt's had multiple acute appts but last CPE was 08/10/20 (over a year) and no future appts

## 2021-09-14 NOTE — Telephone Encounter (Signed)
Please schedule annual exam and refill until then

## 2021-09-15 NOTE — Telephone Encounter (Signed)
Spoke to pt. Made AWV pt 2 appt 10-05-21. Lab appt 9/18. Rx sent to pharmacy.

## 2021-09-22 ENCOUNTER — Ambulatory Visit: Payer: PPO | Admitting: Family Medicine

## 2021-09-29 ENCOUNTER — Telehealth: Payer: Self-pay | Admitting: Family Medicine

## 2021-09-29 NOTE — Telephone Encounter (Signed)
Pt has a CPE scheduled on 10/05/21 not an acute appt for cough, please get her scheduled for an appt for cough and congestion with any available provider. PCP doesn't have any appts until next Wednesday (out of the office)

## 2021-09-29 NOTE — Telephone Encounter (Signed)
Called pt & schedule appt for cough for 10/03/21 with dugal

## 2021-09-29 NOTE — Telephone Encounter (Signed)
Pt called in stated she been coughing more than usual with congestion requesting something be called in to pharmacy stated already have a appointed schedule . Please advise #  [phone]

## 2021-10-01 ENCOUNTER — Telehealth: Payer: Self-pay | Admitting: Family Medicine

## 2021-10-01 DIAGNOSIS — I10 Essential (primary) hypertension: Secondary | ICD-10-CM

## 2021-10-01 DIAGNOSIS — E785 Hyperlipidemia, unspecified: Secondary | ICD-10-CM

## 2021-10-01 DIAGNOSIS — R7303 Prediabetes: Secondary | ICD-10-CM

## 2021-10-01 DIAGNOSIS — E039 Hypothyroidism, unspecified: Secondary | ICD-10-CM

## 2021-10-01 NOTE — Telephone Encounter (Signed)
-----   Message from Ellamae Sia sent at 09/27/2021  2:53 PM EDT ----- Regarding: Lab orders for Monday, 9.18.23  AWV lab orders, please.

## 2021-10-03 ENCOUNTER — Ambulatory Visit (INDEPENDENT_AMBULATORY_CARE_PROVIDER_SITE_OTHER): Payer: PPO | Admitting: Family

## 2021-10-03 ENCOUNTER — Encounter: Payer: Self-pay | Admitting: Family

## 2021-10-03 ENCOUNTER — Other Ambulatory Visit (INDEPENDENT_AMBULATORY_CARE_PROVIDER_SITE_OTHER): Payer: PPO

## 2021-10-03 VITALS — BP 124/58 | HR 65 | Temp 98.3°F | Resp 16 | Ht 67.5 in | Wt 234.1 lb

## 2021-10-03 DIAGNOSIS — Z20822 Contact with and (suspected) exposure to covid-19: Secondary | ICD-10-CM | POA: Insufficient documentation

## 2021-10-03 DIAGNOSIS — R7303 Prediabetes: Secondary | ICD-10-CM

## 2021-10-03 DIAGNOSIS — I10 Essential (primary) hypertension: Secondary | ICD-10-CM | POA: Diagnosis not present

## 2021-10-03 DIAGNOSIS — E039 Hypothyroidism, unspecified: Secondary | ICD-10-CM

## 2021-10-03 DIAGNOSIS — J069 Acute upper respiratory infection, unspecified: Secondary | ICD-10-CM | POA: Diagnosis not present

## 2021-10-03 DIAGNOSIS — B370 Candidal stomatitis: Secondary | ICD-10-CM | POA: Insufficient documentation

## 2021-10-03 DIAGNOSIS — E785 Hyperlipidemia, unspecified: Secondary | ICD-10-CM

## 2021-10-03 LAB — POC COVID19 BINAXNOW: SARS Coronavirus 2 Ag: NEGATIVE

## 2021-10-03 LAB — CBC WITH DIFFERENTIAL/PLATELET
Basophils Absolute: 0 10*3/uL (ref 0.0–0.1)
Basophils Relative: 0.8 % (ref 0.0–3.0)
Eosinophils Absolute: 0.2 10*3/uL (ref 0.0–0.7)
Eosinophils Relative: 3.1 % (ref 0.0–5.0)
HCT: 40.5 % (ref 36.0–46.0)
Hemoglobin: 13.6 g/dL (ref 12.0–15.0)
Lymphocytes Relative: 20.9 % (ref 12.0–46.0)
Lymphs Abs: 1.1 10*3/uL (ref 0.7–4.0)
MCHC: 33.6 g/dL (ref 30.0–36.0)
MCV: 90.4 fl (ref 78.0–100.0)
Monocytes Absolute: 0.5 10*3/uL (ref 0.1–1.0)
Monocytes Relative: 9.3 % (ref 3.0–12.0)
Neutro Abs: 3.4 10*3/uL (ref 1.4–7.7)
Neutrophils Relative %: 65.9 % (ref 43.0–77.0)
Platelets: 277 10*3/uL (ref 150.0–400.0)
RBC: 4.49 Mil/uL (ref 3.87–5.11)
RDW: 15.6 % — ABNORMAL HIGH (ref 11.5–15.5)
WBC: 5.1 10*3/uL (ref 4.0–10.5)

## 2021-10-03 LAB — TSH: TSH: 3.96 u[IU]/mL (ref 0.35–5.50)

## 2021-10-03 LAB — COMPREHENSIVE METABOLIC PANEL
ALT: 16 U/L (ref 0–35)
AST: 11 U/L (ref 0–37)
Albumin: 3.3 g/dL — ABNORMAL LOW (ref 3.5–5.2)
Alkaline Phosphatase: 72 U/L (ref 39–117)
BUN: 24 mg/dL — ABNORMAL HIGH (ref 6–23)
CO2: 26 mEq/L (ref 19–32)
Calcium: 9.7 mg/dL (ref 8.4–10.5)
Chloride: 105 mEq/L (ref 96–112)
Creatinine, Ser: 1.17 mg/dL (ref 0.40–1.20)
GFR: 45.25 mL/min — ABNORMAL LOW (ref >60.00)
Glucose, Bld: 100 mg/dL — ABNORMAL HIGH (ref 70–99)
Potassium: 4.2 mEq/L (ref 3.5–5.1)
Sodium: 139 mEq/L (ref 135–145)
Total Bilirubin: 0.4 mg/dL (ref 0.2–1.2)
Total Protein: 6.4 g/dL (ref 6.0–8.3)

## 2021-10-03 LAB — LIPID PANEL
Cholesterol: 139 mg/dL (ref 0–200)
HDL: 43.5 mg/dL (ref >39.00)
LDL Cholesterol: 80 mg/dL (ref 0–99)
NonHDL: 95.34
Total CHOL/HDL Ratio: 3
Triglycerides: 78 mg/dL (ref 0.0–149.0)
VLDL: 15.6 mg/dL (ref 0.0–40.0)

## 2021-10-03 LAB — HEMOGLOBIN A1C: Hgb A1c MFr Bld: 6 % (ref 4.6–6.5)

## 2021-10-03 MED ORDER — AMOXICILLIN-POT CLAVULANATE 875-125 MG PO TABS: 1.0000 | ORAL_TABLET | Freq: Two times a day (BID) | ORAL | 0 refills | Status: DC

## 2021-10-03 MED ORDER — NYSTATIN 100000 UNIT/ML MT SUSP: 5.0000 mL | Freq: Four times a day (QID) | OROMUCOSAL | 0 refills | Status: AC

## 2021-10-03 NOTE — Progress Notes (Signed)
Covid negative d/w pt in office.

## 2021-10-03 NOTE — Progress Notes (Signed)
Established Patient Office Visit  Subjective:  Patient ID: Amber Miles, female    DOB: Oct 23, 1945  Age: 76 y.o. MRN: [id number]  CC:  Chief Complaint  Patient presents with  . Cough    Years from asthma started last week. Coughed up blood Friday and Saturday.    HPI Amber Miles is here today with concerns.   Over the last one week with increased coughing that is slightly productive, and has noticed she is coughing up some blood which she states she feels is from her throat not her chest. She describes the blood sputum as small traces of bright red blood. No chest congestion. No nasal congestion, does has pnd. Some sore throat. Some hoarseness. No ear pain. No sob. Feels more in her throat more than anything.   No fever or chills.   Using cough drops with some mild relief.   Past Medical History:  Diagnosis Date  . Allergy    allergic rhinitis  . Anxiety   . Arthritis    OA knee replacement  . Cataract   . Degenerative disc disease, thoracic   . Family history of breast cancer   . Family history of genetic disease carrier    paternal cousin has NBN mutation  . GERD (gastroesophageal reflux disease)   . Hyperlipidemia   . Hypertension   . Hypothyroid   . Reactive airways dysfunction syndrome ([place])   . Skin lesions, generalized    squamous cell skin lesions and basal cell skin lesions    Past Surgical History:  Procedure Laterality Date  . ABDOMINAL HYSTERECTOMY  1990s   hyst with oophrectomy for B9lesion/cyst and prolapse  . BREAST BIOPSY Bilateral    benign  . JOINT REPLACEMENT  05/2009   left total knee replacement  . recocele     and cystocele repair  . TONSILLECTOMY    . tsa      Family History  Problem Relation Age of Onset  . Hypertension Mother   . Stroke Mother   . Depression Mother   . Alzheimer's disease Mother   . Heart disease Father   . Cerebral palsy Father   . Hypertension Sister   . Breast cancer Cousin 50       NBN mutation  .  Melanoma Maternal Uncle        dx >50    Social History   Socioeconomic History  . Marital status: Widowed    Spouse name: Not on file  . Number of children: Not on file  . Years of education: Not on file  . Highest education level: Not on file  Occupational History  . Not on file  Tobacco Use  . Smoking status: Never  . Smokeless tobacco: Never  Vaping Use  . Vaping Use: Never used  Substance and Sexual Activity  . Alcohol use: No    Alcohol/week: 0.0 standard drinks of alcohol  . Drug use: No  . Sexual activity: Not Currently  Other Topics Concern  . Not on file  Social History Narrative  . Not on file   Social Determinants of Health   Financial Resource Strain: Low Risk  (08/04/2021)   Overall Financial Resource Strain (CARDIA)   . Difficulty of Paying Living Expenses: Not hard at all  Food Insecurity: No Food Insecurity (08/04/2021)   Hunger Vital Sign   . Worried About Charity fundraiser in the Last Year: Never true   . Ran Out of Food in the Last  Year: Never true  Transportation Needs: No Transportation Needs (08/04/2021)   PRAPARE - Transportation   . Lack of Transportation (Medical): No   . Lack of Transportation (Non-Medical): No  Physical Activity: Sufficiently Active (08/04/2021)   Exercise Vital Sign   . Days of Exercise per Week: 7 days   . Minutes of Exercise per Session: 30 min  Stress: No Stress Concern Present (08/04/2021)   [place]   . Feeling of Stress : Not at all  Social Connections: Moderately Integrated (08/04/2021)   Social Connection and Isolation Panel [NHANES]   . Frequency of Communication with Friends and Family: More than three times a week   . Frequency of Social Gatherings with Friends and Family: More than three times a week   . Attends Religious Services: More than 4 times per year   . Active Member of Clubs or Organizations: Yes   . Attends Archivist  Meetings: More than 4 times per year   . Marital Status: Widowed  Intimate Partner Violence: Not At Risk (08/04/2021)   Humiliation, Afraid, Rape, and Kick questionnaire   . Fear of Current or Ex-Partner: No   . Emotionally Abused: No   . Physically Abused: No   . Sexually Abused: No    Outpatient Medications Prior to Visit  Medication Sig Dispense Refill  . Acetaminophen-guaiFENesin (MUCINEX COLD & FLU PO) Take by mouth.    Marland Kitchen acyclovir (ZOVIRAX) 800 MG tablet TAKE 1 TABLET BY MOUTH 3 TIMES DAILY FOR 3 DAYS FLARES  2  . albuterol (VENTOLIN HFA) 108 (90 Base) MCG/ACT inhaler INHALE 1 TO 2 PUFFS EVERY 4 TO 6 HOURS AS NEEDED FOR WHEEZE OR COUGH 8.5 each 1  . amoxicillin (AMOXIL) 500 MG capsule Take 500 mg by mouth as directed.    Marland Kitchen azelastine (ASTELIN) 0.1 % nasal spray Place 1 spray into both nostrils 2 (two) times daily.    Marland Kitchen Dextromethorphan-guaiFENesin (DELSYM COUGH/CHEST CONGEST DM) 5-100 MG/5ML LIQD Take by mouth.    Mariane Baumgarten Calcium (STOOL SOFTENER PO) Take 3 tablets by mouth daily as needed.    Marland Kitchen esomeprazole (NEXIUM) 40 MG capsule TAKE 1 CAPSULE BY MOUTH EVERY DAY BEFORE BREAKFAST 90 capsule 2  . estradiol (VIVELLE-DOT) 0.0375 MG/24HR PLACE 1 PATCH ONTO THE SKIN 2 (TWO) TIMES A WEEK. 24 patch 3  . FLOVENT HFA 110 MCG/ACT inhaler Inhale 2 puffs into the lungs in the morning and at bedtime.    . Glucosamine-Chondroitin 750-600 MG TABS Take 1 tablet by mouth 2 (two) times daily.      Marland Kitchen lisinopril (ZESTRIL) 5 MG tablet TAKE 1 TABLET BY MOUTH EVERY DAY 90 tablet 2  . montelukast (SINGULAIR) 10 MG tablet Take 10 mg by mouth daily.     . Multiple Vitamin (MULTIVITAMIN) tablet Take 1 tablet by mouth daily.      . naproxen sodium (ALEVE) 220 MG tablet Take 220 mg by mouth every 12 (twelve) hours.    . potassium chloride SA (KLOR-CON M20) 20 MEQ tablet TAKE 1 TABLET BY MOUTH EVERY DAY 90 tablet 3  . SYNTHROID 112 MCG tablet Take 112 mcg by mouth daily.  11  . tiZANidine (ZANAFLEX) 4 MG  tablet TAKE 1 TABLET BY MOUTH EVERY 6 HOURS AS NEEDED 360 tablet 0  . traMADol (ULTRAM) 50 MG tablet TAKE 1-2 TABLETS BY MOUTH EVERY 8 HOURS AS NEEDED FOR SEVERE(SHOULDER PAIN). CAUTION OF SEDATION 30 tablet 0  . triamterene-hydrochlorothiazide (MAXZIDE-25) 37.5-25  MG tablet TAKE 2 TABLETS BY MOUTH EVERY DAY 180 tablet 2  . verapamil (CALAN-SR) 240 MG CR tablet TAKE 1 TABLET (240 MG TOTAL) BY MOUTH 2 (TWO) TIMES DAILY. 180 tablet 0   No facility-administered medications prior to visit.    Allergies  Allergen Reactions  . Neosporin [Neomycin-Polymyxin-Gramicidin] Other (See Comments)    blisters  . Tape Swelling    Itching and redness Pt said severe reaction to surgical tape.? latex        Objective:    Physical Exam Constitutional:      General: She is not in acute distress.    Appearance: Normal appearance. She is obese. She is not ill-appearing.  HENT:     Right Ear: Tympanic membrane normal.     Left Ear: Tympanic membrane normal.     Nose: Nose normal. No congestion or rhinorrhea.     Right Turbinates: Not enlarged or swollen.     Left Turbinates: Not enlarged or swollen.     Right Sinus: No maxillary sinus tenderness or frontal sinus tenderness.     Left Sinus: No maxillary sinus tenderness or frontal sinus tenderness.     Mouth/Throat:     Mouth: Mucous membranes are moist.     Tongue: Lesions (white budding on base of erythema on tongue) present.     Pharynx: No pharyngeal swelling, oropharyngeal exudate or posterior oropharyngeal erythema.     Tonsils: No tonsillar exudate.  Eyes:     Extraocular Movements: Extraocular movements intact.     Conjunctiva/sclera: Conjunctivae normal.     Pupils: Pupils are equal, round, and reactive to light.  Neck:     Thyroid: No thyroid mass.  Cardiovascular:     Rate and Rhythm: Normal rate and regular rhythm.  Pulmonary:     Effort: Pulmonary effort is normal.     Breath sounds: Normal breath sounds.  Lymphadenopathy:      Cervical: Cervical adenopathy present.     Right cervical: Superficial cervical adenopathy present.     Left cervical: Superficial cervical adenopathy present.  Neurological:     Mental Status: She is alert.    BP (!) 124/58   Pulse 65   Temp 98.3 F (36.8 C)   Resp 16   Ht 5' 7.5" (1.715 m)   Wt 234 lb 2 oz (106.2 kg)   SpO2 97%   BMI 36.13 kg/m  Wt Readings from Last 3 Encounters:  10/03/21 234 lb 2 oz (106.2 kg)  09/08/21 236 lb 2 oz (107.1 kg)  08/04/21 235 lb (106.6 kg)     Health Maintenance Due  Topic Date Due  . COVID-19 Vaccine (6 - Pfizer series) 03/05/2021  . INFLUENZA VACCINE  08/16/2021    There are no preventive care reminders to display for this patient.  Lab Results  Component Value Date   TSH 9.07 (H) 08/04/2020   Lab Results  Component Value Date   WBC 5.1 08/04/2020   HGB 15.8 (H) 08/04/2020   HCT 46.5 (H) 08/04/2020   MCV 90.2 08/04/2020   PLT 296.0 08/04/2020   Lab Results  Component Value Date   NA 136 08/04/2020   K 3.9 08/04/2020   CO2 25 08/04/2020   GLUCOSE 102 (H) 08/04/2020   BUN 22 08/04/2020   CREATININE 1.11 08/04/2020   BILITOT 0.5 08/04/2020   ALKPHOS 69 08/04/2020   AST 19 08/04/2020   ALT 19 08/04/2020   PROT 7.1 08/04/2020   ALBUMIN 4.2 08/04/2020   CALCIUM  10.0 08/04/2020   GFR 48.60 (L) 08/04/2020   Lab Results  Component Value Date   HGBA1C 6.0 08/04/2020      Assessment & Plan:   Problem List Items Addressed This Visit       Respiratory   Upper respiratory infection, acute    rx augmentin 875/125 mg po bid x 10 days Take antibiotic as prescribed. Increase oral fluids. Pt to f/u if sx worsen and or fail to improve in 2-3 days.  If no improvement can consider cxr however today no chest congestion, and lungs clear on auscultation.        Relevant Medications   amoxicillin-clavulanate (AUGMENTIN) 875-125 MG tablet   nystatin (MYCOSTATIN) 100000 UNIT/ML suspension     Digestive   Oral candida -  Primary    rx nystatin 5 ml QID x 7 days  Wash mouth after use of inhalers        Relevant Medications   nystatin (MYCOSTATIN) 100000 UNIT/ML suspension     Other   Suspected COVID-19 virus infection    covid tested in office, negative.       Relevant Orders   POC COVID-19 BinaxNow    Meds ordered this encounter  Medications  . amoxicillin-clavulanate (AUGMENTIN) 875-125 MG tablet    Sig: Take 1 tablet by mouth 2 (two) times daily.    Dispense:  20 tablet    Refill:  0    Order Specific Question:   Supervising Provider    Answer:   BEDSOLE, AMY E [[id number]]  . nystatin (MYCOSTATIN) 100000 UNIT/ML suspension    Sig: Take 5 mLs (500,000 Units total) by mouth 4 (four) times daily for 7 days. Swish in mouth for at least 60 seconds, and then spit out. Do not swallow.    Dispense:  140 mL    Refill:  0    Order Specific Question:   Supervising Provider    Answer:   BEDSOLE, AMY E [[id number]]    Follow-up: Return if symptoms worsen or fail to improve with pcp.    Eugenia Pancoast, FNP

## 2021-10-03 NOTE — Assessment & Plan Note (Signed)
rx nystatin 5 ml QID x 7 days  Wash mouth after use of inhalers

## 2021-10-03 NOTE — Assessment & Plan Note (Addendum)
rx augmentin 875/125 mg po bid x 10 days Take antibiotic as prescribed. Increase oral fluids. Pt to f/u if sx worsen and or fail to improve in 2-3 days.  If no improvement can consider cxr however today no chest congestion, and lungs clear on auscultation.

## 2021-10-03 NOTE — Assessment & Plan Note (Signed)
covid tested in office, negative.

## 2021-10-03 NOTE — Patient Instructions (Signed)
  Gargle with warm salt water as needed for sore throat.   Regards,   Eugenia Pancoast FNP-C

## 2021-10-05 ENCOUNTER — Encounter: Payer: Self-pay | Admitting: Family Medicine

## 2021-10-05 ENCOUNTER — Ambulatory Visit (INDEPENDENT_AMBULATORY_CARE_PROVIDER_SITE_OTHER): Payer: PPO | Admitting: Family Medicine

## 2021-10-05 VITALS — BP 139/80 | HR 75 | Temp 97.3°F | Ht 67.0 in | Wt 231.4 lb

## 2021-10-05 DIAGNOSIS — R7303 Prediabetes: Secondary | ICD-10-CM | POA: Diagnosis not present

## 2021-10-05 DIAGNOSIS — Z0000 Encounter for general adult medical examination without abnormal findings: Secondary | ICD-10-CM

## 2021-10-05 DIAGNOSIS — I10 Essential (primary) hypertension: Secondary | ICD-10-CM | POA: Diagnosis not present

## 2021-10-05 DIAGNOSIS — D329 Benign neoplasm of meninges, unspecified: Secondary | ICD-10-CM

## 2021-10-05 DIAGNOSIS — Z23 Encounter for immunization: Secondary | ICD-10-CM | POA: Diagnosis not present

## 2021-10-05 DIAGNOSIS — E785 Hyperlipidemia, unspecified: Secondary | ICD-10-CM

## 2021-10-05 DIAGNOSIS — E039 Hypothyroidism, unspecified: Secondary | ICD-10-CM | POA: Diagnosis not present

## 2021-10-05 NOTE — Assessment & Plan Note (Signed)
bp in fair control at this time  BP Readings from Last 1 Encounters:  10/05/21 139/80   No changes needed Most recent labs reviewed  Disc lifstyle change with low sodium diet and exercise  Plan to continue  Verapamil SR 240 mg daily  Lisinopril 5 mg daily  Triam hct 37.5-25 mg daily

## 2021-10-05 NOTE — Assessment & Plan Note (Signed)
No clinical changes 

## 2021-10-05 NOTE — Patient Instructions (Addendum)
Make sure you get at least 2000 iu of vitamin D daily    Aim for 64 oz of fluids daily- mostly water if you can   Flu shot today   I think RSV shot is a good idea    To prevent diabetes Try to get most of your carbohydrates from produce (with the exception of white potatoes)  Eat less bread/pasta/rice/snack foods/cereals/sweets and other items from the middle of the grocery store (processed carbs)   For cholesterol  Avoid red meat/ fried foods/ egg yolks/ fatty breakfast meats/ butter, cheese and high fat dairy/ and shellfish

## 2021-10-05 NOTE — Assessment & Plan Note (Signed)
Reviewed health habits including diet and exercise and skin cancer prevention Reviewed appropriate screening tests for age  Also reviewed health mt list, fam hx and immunization status , as well as social and family history   See HPI Labs reviewed Flu shot given  dexa utd /no falls or fx  Disc vit D intake  colonosocpy utd 09/2020 Mammogram scheduled in November

## 2021-10-05 NOTE — Progress Notes (Signed)
Subjective:    Patient ID: Amber Miles, female    DOB: [birth year], 76 y.o.   MRN: [id number]  HPI Here for health maintenance exam and to review chronic medical problems    Wt Readings from Last 3 Encounters:  10/05/21 231 lb 6 oz (105 kg)  10/03/21 234 lb 2 oz (106.2 kg)  09/08/21 236 lb 2 oz (107.1 kg)   36.24 kg/m    Was tx for uri with augmentin recently as well as thrush  Neg covid test  Still no voice Is doing better overall    Immunization History  Administered Date(s) Administered   Fluad Quad(high Dose 65+) 10/09/2019   Influenza Split 10/18/2010   Influenza Whole 11/30/2006, 10/28/2008, 10/27/2009   Influenza, High Dose Seasonal PF 11/11/2015, 10/05/2016, 10/18/2017, 11/11/2018, 10/29/2020   Influenza-Unspecified 10/07/2012, 08/30/2013, 10/01/2014   PFIZER Comirnaty(Gray Top)Covid-19 Tri-Sucrose Vaccine 06/09/2020   PFIZER(Purple Top)SARS-COV-2 Vaccination 02/04/2019, 02/24/2019, 10/20/2019   Pfizer Covid-19 Vaccine Bivalent Booster 32yr & up 11/02/2020   Pneumococcal Conjugate-13 05/27/2014   Pneumococcal Polysaccharide-23 12/04/2011, 10/07/2015, 10/05/2016, 10/04/2017, 05/15/2019, 05/06/2020, 05/12/2021   Td 12/04/2011   Zoster Recombinat (Shingrix) 08/06/2019, 10/09/2019   Zoster, Live 10/05/2010   Health Maintenance Due  Topic Date Due   COVID-19 Vaccine (6 - Pfizer series) 03/05/2021   INFLUENZA VACCINE  08/16/2021   Flu shot given today   Mammogram scheduled 12/05/21 Self breast exam: no lumps   Is on hormone patch    Dexa  05/2020 -normal bmd   Falls: none Fractures:none  Supplements : vitamin D in her glucosamine supplement  Exercise : doing PT exercises for shoulder and back and she walks   Colonoscopy 09/2020 with 5 y recall   HTN bp is stable today  No cp or palpitations or headaches or edema  No side effects to medicines  BP Readings from Last 3 Encounters:  10/05/21 (!) 150/78  10/03/21 (!) 124/58  09/08/21 (!) 140/64      Verapamil SR 240 mg daily  Lisinopril 5 mg daily  Triam hct 37.5-25 mg daily   She does not have a cuff at home any longer    Lab Results  Component Value Date   CREATININE 1.17 10/03/2021   BUN 24 (H) 10/03/2021   NA 139 10/03/2021   K 4.2 10/03/2021   CL 105 10/03/2021   CO2 26 10/03/2021   GFR 45.2  Needs to drink more fluids    Hypothyroidism  Pt has no clinical changes No change in energy level/ hair or skin/ edema and no tremor Lab Results  Component Value Date   TSH 3.96 10/03/2021    Sees Dr MJerilynn Mages [place]  Levothyroxine 112 mcg daily     Hyperlipidemia Lab Results  Component Value Date   CHOL 139 10/03/2021   CHOL 186 08/04/2020   CHOL 163 07/22/2019   Lab Results  Component Value Date   HDL 43.50 10/03/2021   HDL 46.60 08/04/2020   HDL 42.90 07/22/2019   Lab Results  Component Value Date   LDLCALC 80 10/03/2021   LDLCALC 83 07/22/2019   LDLCALC 98 07/08/2018   Lab Results  Component Value Date   TRIG 78.0 10/03/2021   TRIG 255.0 (H) 08/04/2020   TRIG 185.0 (H) 07/22/2019   Lab Results  Component Value Date   CHOLHDL 3 10/03/2021   CHOLHDL 4 08/04/2020   CHOLHDL 4 07/22/2019   Lab Results  Component Value Date   LDLDIRECT 114.0 08/04/2020  LDLDIRECT 105.0 06/15/2015   Triglycerides are better Eating out of the garden /better overall    Prediabetes Lab Results  Component Value Date   HGBA1C 6.0 10/03/2021  This is stable  Not eating a lot of sugar    Lab Results  Component Value Date   WBC 5.1 10/03/2021   HGB 13.6 10/03/2021   HCT 40.5 10/03/2021   MCV 90.4 10/03/2021   PLT 277.0 10/03/2021   Lab Results  Component Value Date   ALT 16 10/03/2021   AST 11 10/03/2021   ALKPHOS 72 10/03/2021   BILITOT 0.4 10/03/2021    Patient Active Problem List   Diagnosis Date Noted   Upper respiratory infection, acute 10/03/2021   Oral candida 10/03/2021   Suspected COVID-19 virus infection 10/03/2021    Routine general medical examination at a health care facility 07/29/2019   Genetic testing 01/02/2018   Family history of breast cancer    Family history of genetic disease carrier    Meningioma ([place]) 07/17/2017   Fall 01/04/2017   Vasomotor symptoms due to menopause 06/26/2016   Atrophic vaginitis 09/01/2015   Pedal edema 05/27/2014   Estrogen deficiency 02/21/2013   Encounter for Medicare annual wellness exam 02/21/2013   Low back pain potentially associated with radiculopathy 10/11/2012   ALLERGIC RHINITIS 01/28/2010   Prediabetes 07/30/2009   ANXIETY 11/30/2008   Hyperlipidemia 04/13/2008   Hypothyroidism 01/07/2008   HYPERTENSION, BENIGN 01/07/2008   OSTEOARTHRITIS, GENERALIZED 01/07/2008   Past Medical History:  Diagnosis Date   Allergy    allergic rhinitis   Anxiety    Arthritis    OA knee replacement   Cataract    Degenerative disc disease, thoracic    Family history of breast cancer    Family history of genetic disease carrier    paternal cousin has NBN mutation   GERD (gastroesophageal reflux disease)    Hyperlipidemia    Hypertension    Hypothyroid    Reactive airways dysfunction syndrome (HCC)    Skin lesions, generalized    squamous cell skin lesions and basal cell skin lesions   Past Surgical History:  Procedure Laterality Date   ABDOMINAL HYSTERECTOMY  1990s   hyst with oophrectomy for B9lesion/cyst and prolapse   BREAST BIOPSY Bilateral    benign   JOINT REPLACEMENT  05/2009   left total knee replacement   recocele     and cystocele repair   TONSILLECTOMY     tsa     Social History   Tobacco Use   Smoking status: Never   Smokeless tobacco: Never  Vaping Use   Vaping Use: Never used  Substance Use Topics   Alcohol use: No    Alcohol/week: 0.0 standard drinks of alcohol   Drug use: No   Family History  Problem Relation Age of Onset   Hypertension Mother    Stroke Mother    Depression Mother    Alzheimer's disease Mother    Heart  disease Father    Cerebral palsy Father    Hypertension Sister    Breast cancer Cousin 47       NBN mutation   Melanoma Maternal Uncle        dx >50   Allergies  Allergen Reactions   Neosporin [Neomycin-Polymyxin-Gramicidin] Other (See Comments)    blisters   Tape Swelling    Itching and redness Pt said severe reaction to surgical tape.? latex   Current Outpatient Medications on File Prior to Visit  Medication Sig Dispense  Refill   Acetaminophen-guaiFENesin (MUCINEX COLD & FLU PO) Take by mouth.     acyclovir (ZOVIRAX) 800 MG tablet TAKE 1 TABLET BY MOUTH 3 TIMES DAILY FOR 3 DAYS FLARES  2   albuterol (VENTOLIN HFA) 108 (90 Base) MCG/ACT inhaler INHALE 1 TO 2 PUFFS EVERY 4 TO 6 HOURS AS NEEDED FOR WHEEZE OR COUGH 8.5 each 1   amoxicillin (AMOXIL) 500 MG capsule Take 500 mg by mouth as directed.     amoxicillin-clavulanate (AUGMENTIN) 875-125 MG tablet Take 1 tablet by mouth 2 (two) times daily. 20 tablet 0   azelastine (ASTELIN) 0.1 % nasal spray Place 1 spray into both nostrils 2 (two) times daily.     Dextromethorphan-guaiFENesin (DELSYM COUGH/CHEST CONGEST DM) 5-100 MG/5ML LIQD Take by mouth.     Docusate Calcium (STOOL SOFTENER PO) Take 3 tablets by mouth daily as needed.     esomeprazole (NEXIUM) 40 MG capsule TAKE 1 CAPSULE BY MOUTH EVERY DAY BEFORE BREAKFAST 90 capsule 2   estradiol (VIVELLE-DOT) 0.0375 MG/24HR PLACE 1 PATCH ONTO THE SKIN 2 (TWO) TIMES A WEEK. 24 patch 3   FLOVENT HFA 110 MCG/ACT inhaler Inhale 2 puffs into the lungs in the morning and at bedtime.     GLUCOSAMINE-CHONDROITIN-VIT D3 PO Take 1 capsule by mouth 2 (two) times daily.     lisinopril (ZESTRIL) 5 MG tablet TAKE 1 TABLET BY MOUTH EVERY DAY 90 tablet 2   montelukast (SINGULAIR) 10 MG tablet Take 10 mg by mouth daily.      Multiple Vitamin (MULTIVITAMIN) tablet Take 1 tablet by mouth daily.       naproxen sodium (ALEVE) 220 MG tablet Take 220 mg by mouth every 12 (twelve) hours.     nystatin (MYCOSTATIN)  100000 UNIT/ML suspension Take 5 mLs (500,000 Units total) by mouth 4 (four) times daily for 7 days. Swish in mouth for at least 60 seconds, and then spit out. Do not swallow. 140 mL 0   potassium chloride SA (KLOR-CON M20) 20 MEQ tablet TAKE 1 TABLET BY MOUTH EVERY DAY 90 tablet 3   SYNTHROID 112 MCG tablet Take 112 mcg by mouth daily.  11   tiZANidine (ZANAFLEX) 4 MG tablet TAKE 1 TABLET BY MOUTH EVERY 6 HOURS AS NEEDED 360 tablet 0   traMADol (ULTRAM) 50 MG tablet TAKE 1-2 TABLETS BY MOUTH EVERY 8 HOURS AS NEEDED FOR SEVERE(SHOULDER PAIN). CAUTION OF SEDATION 30 tablet 0   triamterene-hydrochlorothiazide (MAXZIDE-25) 37.5-25 MG tablet TAKE 2 TABLETS BY MOUTH EVERY DAY 180 tablet 2   verapamil (CALAN-SR) 240 MG CR tablet TAKE 1 TABLET (240 MG TOTAL) BY MOUTH 2 (TWO) TIMES DAILY. 180 tablet 0   No current facility-administered medications on file prior to visit.    Review of Systems  Constitutional:  Positive for fatigue. Negative for activity change, appetite change, fever and unexpected weight change.  HENT:  Negative for congestion, ear pain, rhinorrhea, sinus pressure and sore throat.   Eyes:  Negative for pain, redness and visual disturbance.  Respiratory:  Negative for cough, shortness of breath and wheezing.   Cardiovascular:  Negative for chest pain and palpitations.  Gastrointestinal:  Negative for abdominal pain, blood in stool, constipation and diarrhea.  Endocrine: Negative for polydipsia and polyuria.  Genitourinary:  Negative for dysuria, frequency and urgency.  Musculoskeletal:  Positive for arthralgias. Negative for back pain and myalgias.  Skin:  Negative for pallor and rash.  Allergic/Immunologic: Negative for environmental allergies.  Neurological:  Negative for dizziness, syncope and headaches.  Hematological:  Negative for adenopathy. Does not bruise/bleed easily.  Psychiatric/Behavioral:  Negative for decreased concentration and dysphoric mood. The patient is not  nervous/anxious.        Objective:   Physical Exam Constitutional:      General: She is not in acute distress.    Appearance: Normal appearance. She is well-developed. She is obese. She is not ill-appearing or diaphoretic.  HENT:     Head: Normocephalic and atraumatic.     Right Ear: Tympanic membrane, ear canal and external ear normal.     Left Ear: Tympanic membrane, ear canal and external ear normal.     Nose: Nose normal. No congestion.     Mouth/Throat:     Mouth: Mucous membranes are moist.     Pharynx: Oropharynx is clear. No posterior oropharyngeal erythema.  Eyes:     General: No scleral icterus.    Extraocular Movements: Extraocular movements intact.     Conjunctiva/sclera: Conjunctivae normal.     Pupils: Pupils are equal, round, and reactive to light.  Neck:     Thyroid: No thyromegaly.     Vascular: No carotid bruit or JVD.  Cardiovascular:     Rate and Rhythm: Normal rate and regular rhythm.     Pulses: Normal pulses.     Heart sounds: Normal heart sounds.     No gallop.  Pulmonary:     Effort: Pulmonary effort is normal. No respiratory distress.     Breath sounds: Normal breath sounds. No wheezing.     Comments: Good air exch Chest:     Chest wall: No tenderness.  Abdominal:     General: Bowel sounds are normal. There is no distension or abdominal bruit.     Palpations: Abdomen is soft. There is no mass.     Tenderness: There is no abdominal tenderness.     Hernia: No hernia is present.  Genitourinary:    Comments: Breast exam: No mass, nodules, thickening, tenderness, bulging, retraction, inflamation, nipple discharge or skin changes noted.  No axillary or clavicular LA.     Musculoskeletal:        General: No tenderness. Normal range of motion.     Cervical back: Normal range of motion and neck supple. No rigidity. No muscular tenderness.     Right lower leg: No edema.     Left lower leg: No edema.     Comments: No kyphosis   Lymphadenopathy:      Cervical: No cervical adenopathy.  Skin:    General: Skin is warm and dry.     Coloration: Skin is not pale.     Findings: No erythema or rash.     Comments: Solar lentigines diffusely Some sks   Neurological:     Mental Status: She is alert. Mental status is at baseline.     Cranial Nerves: No cranial nerve deficit.     Motor: No abnormal muscle tone.     Coordination: Coordination normal.     Gait: Gait normal.     Deep Tendon Reflexes: Reflexes are normal and symmetric. Reflexes normal.  Psychiatric:        Mood and Affect: Mood normal.        Cognition and Memory: Cognition and memory normal.           Assessment & Plan:   Problem List Items Addressed This Visit       Cardiovascular and Mediastinum   HYPERTENSION, BENIGN    bp in fair control at  this time  BP Readings from Last 1 Encounters:  10/05/21 139/80  No changes needed Most recent labs reviewed  Disc lifstyle change with low sodium diet and exercise  Plan to continue  Verapamil SR 240 mg daily  Lisinopril 5 mg daily  Triam hct 37.5-25 mg daily         Endocrine   Hypothyroidism    Hypothyroidism  Pt has no clinical changes No change in energy level/ hair or skin/ edema and no tremor Lab Results  Component Value Date   TSH 3.96 10/03/2021    Levothyroxine 112 mcg  Under endo care For [place] soon         Nervous and Auditory   Meningioma (HCC)    No clinical changes         Other   Hyperlipidemia    Disc goals for lipids and reasons to control them Rev last labs with pt Rev low sat fat diet in detail Diet controlled Much improved triglycerides/eating more plant based foods       Prediabetes    Lab Results  Component Value Date   HGBA1C 6.0 10/03/2021  This is stable disc imp of low glycemic diet and wt loss to prevent DM2       Routine general medical examination at a health care facility - Primary    Reviewed health habits including diet and exercise and skin cancer  prevention Reviewed appropriate screening tests for age  Also reviewed health mt list, fam hx and immunization status , as well as social and family history   See HPI Labs reviewed Flu shot given  dexa utd /no falls or fx  Disc vit D intake  colonosocpy utd 09/2020 Mammogram scheduled in November        Relevant Orders   Flu Vaccine QUAD High Dose(Fluad) (Completed)   Other Visit Diagnoses     Need for influenza vaccination       Relevant Orders   Flu Vaccine QUAD High Dose(Fluad) (Completed)

## 2021-10-05 NOTE — Assessment & Plan Note (Signed)
Lab Results  Component Value Date   HGBA1C 6.0 10/03/2021   This is stable disc imp of low glycemic diet and wt loss to prevent DM2

## 2021-10-05 NOTE — Assessment & Plan Note (Signed)
Disc goals for lipids and reasons to control them Rev last labs with pt Rev low sat fat diet in detail Diet controlled Much improved triglycerides/eating more plant based foods

## 2021-10-05 NOTE — Assessment & Plan Note (Signed)
Hypothyroidism  Pt has no clinical changes No change in energy level/ hair or skin/ edema and no tremor Lab Results  Component Value Date   TSH 3.96 10/03/2021    Levothyroxine 112 mcg  Under endo care [place] soon

## 2021-10-07 DIAGNOSIS — I10 Essential (primary) hypertension: Secondary | ICD-10-CM | POA: Diagnosis not present

## 2021-10-07 DIAGNOSIS — E669 Obesity, unspecified: Secondary | ICD-10-CM | POA: Diagnosis not present

## 2021-10-07 DIAGNOSIS — E042 Nontoxic multinodular goiter: Secondary | ICD-10-CM | POA: Diagnosis not present

## 2021-10-07 DIAGNOSIS — K229 Disease of esophagus, unspecified: Secondary | ICD-10-CM | POA: Diagnosis not present

## 2021-10-07 DIAGNOSIS — E890 Postprocedural hypothyroidism: Secondary | ICD-10-CM | POA: Diagnosis not present

## 2021-10-07 DIAGNOSIS — Z6836 Body mass index (BMI) 36.0-36.9, adult: Secondary | ICD-10-CM | POA: Diagnosis not present

## 2021-10-07 DIAGNOSIS — E0521 Thyrotoxicosis with toxic multinodular goiter with thyrotoxic crisis or storm: Secondary | ICD-10-CM | POA: Diagnosis not present

## 2021-10-07 DIAGNOSIS — D34 Benign neoplasm of thyroid gland: Secondary | ICD-10-CM | POA: Diagnosis not present

## 2021-10-10 DIAGNOSIS — E890 Postprocedural hypothyroidism: Secondary | ICD-10-CM | POA: Diagnosis not present

## 2021-10-10 DIAGNOSIS — E669 Obesity, unspecified: Secondary | ICD-10-CM | POA: Diagnosis not present

## 2021-10-10 DIAGNOSIS — I10 Essential (primary) hypertension: Secondary | ICD-10-CM | POA: Diagnosis not present

## 2021-10-10 DIAGNOSIS — Z6836 Body mass index (BMI) 36.0-36.9, adult: Secondary | ICD-10-CM | POA: Diagnosis not present

## 2021-10-10 DIAGNOSIS — E042 Nontoxic multinodular goiter: Secondary | ICD-10-CM | POA: Diagnosis not present

## 2021-10-13 ENCOUNTER — Other Ambulatory Visit: Payer: Self-pay | Admitting: Family Medicine

## 2021-10-14 DIAGNOSIS — J4520 Mild intermittent asthma, uncomplicated: Secondary | ICD-10-CM | POA: Diagnosis not present

## 2021-10-14 DIAGNOSIS — I10 Essential (primary) hypertension: Secondary | ICD-10-CM | POA: Diagnosis not present

## 2021-10-14 DIAGNOSIS — E042 Nontoxic multinodular goiter: Secondary | ICD-10-CM | POA: Diagnosis not present

## 2021-10-14 DIAGNOSIS — E890 Postprocedural hypothyroidism: Secondary | ICD-10-CM | POA: Diagnosis not present

## 2021-10-14 DIAGNOSIS — K229 Disease of esophagus, unspecified: Secondary | ICD-10-CM | POA: Diagnosis not present

## 2021-10-14 DIAGNOSIS — E0521 Thyrotoxicosis with toxic multinodular goiter with thyrotoxic crisis or storm: Secondary | ICD-10-CM | POA: Diagnosis not present

## 2021-10-14 DIAGNOSIS — N958 Other specified menopausal and perimenopausal disorders: Secondary | ICD-10-CM | POA: Diagnosis not present

## 2021-10-14 DIAGNOSIS — E669 Obesity, unspecified: Secondary | ICD-10-CM | POA: Diagnosis not present

## 2021-10-14 DIAGNOSIS — Z6836 Body mass index (BMI) 36.0-36.9, adult: Secondary | ICD-10-CM | POA: Diagnosis not present

## 2021-10-16 ENCOUNTER — Other Ambulatory Visit: Payer: Self-pay | Admitting: Family Medicine

## 2021-10-17 NOTE — Telephone Encounter (Signed)
Last filled on 07/18/21 #360 tabs/ 0 refills, last OV was on 10/05/21

## 2021-10-27 DIAGNOSIS — H40053 Ocular hypertension, bilateral: Secondary | ICD-10-CM | POA: Diagnosis not present

## 2021-10-27 DIAGNOSIS — H2513 Age-related nuclear cataract, bilateral: Secondary | ICD-10-CM | POA: Diagnosis not present

## 2021-10-27 DIAGNOSIS — H524 Presbyopia: Secondary | ICD-10-CM | POA: Diagnosis not present

## 2021-12-05 ENCOUNTER — Ambulatory Visit
Admission: RE | Admit: 2021-12-05 | Discharge: 2021-12-05 | Disposition: A | Discharge status: Home / Self Care | Payer: PPO | Source: Ambulatory Visit | Attending: Family Medicine | Admitting: Family Medicine

## 2021-12-05 DIAGNOSIS — R920 Mammographic microcalcification found on diagnostic imaging of breast: Secondary | ICD-10-CM | POA: Diagnosis not present

## 2021-12-05 DIAGNOSIS — N6489 Other specified disorders of breast: Secondary | ICD-10-CM

## 2021-12-28 ENCOUNTER — Telehealth: Payer: Self-pay | Admitting: Family Medicine

## 2021-12-28 DIAGNOSIS — Z85828 Personal history of other malignant neoplasm of skin: Secondary | ICD-10-CM | POA: Diagnosis not present

## 2021-12-28 DIAGNOSIS — D485 Neoplasm of uncertain behavior of skin: Secondary | ICD-10-CM | POA: Diagnosis not present

## 2021-12-28 DIAGNOSIS — D225 Melanocytic nevi of trunk: Secondary | ICD-10-CM | POA: Diagnosis not present

## 2021-12-28 DIAGNOSIS — L821 Other seborrheic keratosis: Secondary | ICD-10-CM | POA: Diagnosis not present

## 2021-12-28 DIAGNOSIS — D692 Other nonthrombocytopenic purpura: Secondary | ICD-10-CM | POA: Diagnosis not present

## 2021-12-28 DIAGNOSIS — L820 Inflamed seborrheic keratosis: Secondary | ICD-10-CM | POA: Diagnosis not present

## 2021-12-28 DIAGNOSIS — C44519 Basal cell carcinoma of skin of other part of trunk: Secondary | ICD-10-CM | POA: Diagnosis not present

## 2021-12-28 DIAGNOSIS — L570 Actinic keratosis: Secondary | ICD-10-CM | POA: Diagnosis not present

## 2021-12-28 DIAGNOSIS — D0422 Carcinoma in situ of skin of left ear and external auricular canal: Secondary | ICD-10-CM | POA: Diagnosis not present

## 2021-12-29 NOTE — Telephone Encounter (Signed)
CPE was on 10/05/21, last filled on 10/17/21 #360 tabs with 0 refills

## 2021-12-29 NOTE — Telephone Encounter (Signed)
It this early?

## 2021-12-30 NOTE — Telephone Encounter (Signed)
Pt had not picked up refill in Oct. She last filled med in Aug., pt advised refill is at pharmacy from 10/17/21 she just needs to get that one filled

## 2021-12-30 NOTE — Telephone Encounter (Signed)
Patient called and stated she wanted to know why the medication wasn't refilled. Call back number [phone].

## 2022-02-24 ENCOUNTER — Other Ambulatory Visit: Payer: Self-pay | Admitting: Family Medicine

## 2022-02-24 NOTE — Telephone Encounter (Signed)
Last filled on 10/28/20 #24 patches with 3 refills, CPE was on 10/05/21

## 2022-02-28 DIAGNOSIS — M5416 Radiculopathy, lumbar region: Secondary | ICD-10-CM | POA: Diagnosis not present

## 2022-03-07 ENCOUNTER — Other Ambulatory Visit: Payer: Self-pay | Admitting: Family Medicine

## 2022-03-22 DIAGNOSIS — M5416 Radiculopathy, lumbar region: Secondary | ICD-10-CM | POA: Diagnosis not present

## 2022-03-24 DIAGNOSIS — J301 Allergic rhinitis due to pollen: Secondary | ICD-10-CM | POA: Diagnosis not present

## 2022-03-24 DIAGNOSIS — K219 Gastro-esophageal reflux disease without esophagitis: Secondary | ICD-10-CM | POA: Diagnosis not present

## 2022-03-24 DIAGNOSIS — R490 Dysphonia: Secondary | ICD-10-CM | POA: Diagnosis not present

## 2022-03-24 DIAGNOSIS — E041 Nontoxic single thyroid nodule: Secondary | ICD-10-CM | POA: Diagnosis not present

## 2022-04-10 ENCOUNTER — Encounter: Payer: Self-pay | Admitting: Speech Pathology

## 2022-04-10 ENCOUNTER — Ambulatory Visit: Payer: PPO | Attending: Otolaryngology | Admitting: Speech Pathology

## 2022-04-10 DIAGNOSIS — R498 Other voice and resonance disorders: Secondary | ICD-10-CM | POA: Insufficient documentation

## 2022-04-10 DIAGNOSIS — R490 Dysphonia: Secondary | ICD-10-CM | POA: Insufficient documentation

## 2022-04-10 NOTE — Therapy (Unsigned)
OUTPATIENT SPEECH LANGUAGE PATHOLOGY VOICE EVALUATION   Patient Name: Amber Miles MRN: [id number] DOB:[birth year], 77 y.o., female Today's Date: 04/10/2022  PCP: Loura Pardon, MD REFERRING PROVIDER: Carloyn Manner, MD   End of Session - 04/10/22 1407     Visit Number 1    Number of Visits 17    Date for SLP Re-Evaluation 07/09/22    SLP Start Time 20    SLP Stop Time  1500    SLP Time Calculation (min) 60 min    Activity Tolerance Patient tolerated treatment well             Past Medical History:  Diagnosis Date   Allergy    allergic rhinitis   Anxiety    Arthritis    OA knee replacement   Cataract    Degenerative disc disease, thoracic    Family history of breast cancer    Family history of genetic disease carrier    paternal cousin has NBN mutation   GERD (gastroesophageal reflux disease)    Hyperlipidemia    Hypertension    Hypothyroid    Reactive airways dysfunction syndrome ([place])    Skin lesions, generalized    squamous cell skin lesions and basal cell skin lesions   Past Surgical History:  Procedure Laterality Date   ABDOMINAL HYSTERECTOMY  1990s   hyst with oophrectomy for B9lesion/cyst and prolapse   BREAST BIOPSY Bilateral    benign   JOINT REPLACEMENT  05/2009   left total knee replacement   recocele     and cystocele repair   TONSILLECTOMY     tsa     Patient Active Problem List   Diagnosis Date Noted   Upper respiratory infection, acute 10/03/2021   Oral candida 10/03/2021   Suspected COVID-19 virus infection 10/03/2021   Routine general medical examination at a health care facility 07/29/2019   Genetic testing 01/02/2018   Family history of breast cancer    Family history of genetic disease carrier    Meningioma ([place]) 07/17/2017   Fall 01/04/2017   Vasomotor symptoms due to menopause 06/26/2016   Atrophic vaginitis 09/01/2015   Pedal edema 05/27/2014   Estrogen deficiency 02/21/2013   Encounter for Medicare annual wellness  exam 02/21/2013   Low back pain potentially associated with radiculopathy 10/11/2012   ALLERGIC RHINITIS 01/28/2010   Prediabetes 07/30/2009   ANXIETY 11/30/2008   Hyperlipidemia 04/13/2008   Hypothyroidism 01/07/2008   HYPERTENSION, BENIGN 01/07/2008   OSTEOARTHRITIS, GENERALIZED 01/07/2008    Onset date: January 2023  REFERRING DIAG: Muscle tension dysphonia  THERAPY DIAG:  Dysphonia  Other voice and resonance disorders  Rationale for Evaluation and Treatment Rehabilitation  SUBJECTIVE:   SUBJECTIVE STATEMENT: "I can't get people's attention when they're far away from me." Pt accompanied by: self  PERTINENT HISTORY: Patient is a 77 y.o. 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 Yes: NPRS scale: 3/10 Pain location: right hip   FALLS: Has patient fallen in last 6 months? 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 Caffeine/alcohol intake:  8 oz tea, drinks decaf coffee Daily voice use: minimal and moderate Environmental risks: None reported Occupational risks: None identified Misuse: Hyperfunction, Speaks without adequate breath support, and Speaks on residual capacity Phonotraumatic behaviors: Excessive and/or habitual throat clearing  PERCEPTUAL VOICE ASSESSMENT: Voice quality: hoarse, rough, strained, and low vocal intensity Vocal abuse:  chronic cough Resonance: normal Respiratory  function: thoracic breathing  OBJECTIVE VOICE ASSESSMENT: Objective Voice Measurements Initial Evaluation 04/10/2022     Maximum phonation time for sustained "ah" 6.7 sec    Average dB for sustained "ah" 75 dB    Average fundamental frequency during sustained "ah" 239 Hz  (Within  average of 244 Hz +/- 27 for gender)     Relative Average Perterbuation for "ah" 1.183 %    Shimmer 7.197%    Noise to Harmonic Ratio 0.167    Voice Turbulence Index 0.088    Habitual pitch 172 Hz    Highest dynamic pitch in conversational speech 368 Hz    Lowest dynamic pitch in conversational speech 71 Hz    Average dB in conversation 70 dB    /s/ :/z/ ratio(suggestive of dysfunction > 1.0) 2.7    VRQOL 12/50      ORAL MOTOR EXAMINATION Facial : WFL Lingual: WFL Velum: WFL Mandible: WFL Cough: WFL Voice: Hoarse    PATIENT REPORTED OUTCOME MEASURES (PROM):  VOICE-RELATED QUALITY OF LIFE (V-RQOL)  The Voice-Related Quality of Life (V-RQOL) measure is a patient-reported outcome measure assessing voice-related problems. Patient reported they Have trouble speaking loudly or being heard in noisy situations, Run out of air and needs to take frequent breaths when talkin, Do not know what will come out when they begin speaking, and Feel anxious or frustrated due to voice   V-RQOL Score:  (12 /50) (10-15 =excellent, 16-20=very good, 21-25 = good, 26-30 = fair, 30+ = poor)   TODAY'S TREATMENT:  Diagnostic intervention using laryngeal relaxation and semi-occluded vocal tract techniques with improvement in sustained vowel phonation time 9.5 sec and quality    PATIENT EDUCATION: Education details: proposed treatment plan Person educated: Patient Education method: Explanation Education comprehension: verbalized understanding   HOME EXERCISE PROGRAM: Vocal hygiene to be provided     GOALS: Goals reviewed with patient? Yes  SHORT TERM GOALS: Target date: 10 sessions  Patient will demonstrate  laryngeal relaxation strategies with independence.  Baseline: Goal status: INITIAL  2.   Patient will implement vocal techniques (resonant voice, abdominal breathing, flow phonation) to reduce hyperfunction for [facility] pitch and vocal quality at sentence level 90% acc. Baseline:  Goal status: INITIAL  3.  Patient will complete home exercise program for voice with modified independence. Baseline:  Goal status: INITIAL    LONG TERM GOALS: Target date: 07/09/2022  Patient will implement vocal techniques (resonant voice, abdominal breathing, flow phonation) to reduce hyperfunction for [facility] pitch and vocal quality at conversation level 80% acc.  Baseline:  Goal status: INITIAL  2.  Patient will improve vocal quality and pitch to within normal range as measured by perceptual scales and acoustic/aerodynamic voice measures.  Baseline:  Goal status: INITIAL  3.  Patient will report improved voice quality of life as measured by Voice-Related Quality of Life scale. 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 She reports onset of voice problem following brochitis and lu in Jan 2023. Vocal quality is  hoarse, gravelly, and she is noted to speak on residual capacity. I recommend course of ST focused on vocal hygiene, vocal retraining, and abdominal breathing to improve vocal quality for social and community activities.  OBJECTIVE IMPAIRMENTS include voice disorder. These impairments are limiting patient from effectively communicating at home and in community. Factors affecting potential to achieve goals and functional outcome are  time post onset (>1 year) .Marland Kitchen Patient will benefit from skilled SLP services to address above impairments and improve overall function.  REHAB POTENTIAL: Good  PLAN: SLP FREQUENCY: 2x/week  SLP DURATION: 8 weeks  PLANNED INTERVENTIONS: Cueing hierachy,  SLP instruction and feedback, Patient/family education, Re-evaluation, and laryngeal relaxation, vocal hygiene, vocal function exercises, abdominal breathing    Deneise Lever, MS, Actor [phone]

## 2022-04-12 ENCOUNTER — Ambulatory Visit: Payer: PPO | Admitting: Speech Pathology

## 2022-04-12 DIAGNOSIS — R490 Dysphonia: Secondary | ICD-10-CM

## 2022-04-12 DIAGNOSIS — R498 Other voice and resonance disorders: Secondary | ICD-10-CM

## 2022-04-12 NOTE — Patient Instructions (Addendum)
Abdominal Breathing : 15 minutes, twice a day  Lay on your back or in a recliner watching your belly move up with inhalation and down with exhalations   Shoulders down - this is a cue to relax Place your hand on your abdomen - this helps you focus on easy abdominal breath support - the best and most relaxed way to breathe Breathe in through your nose and fill your belly with air, watching your hand move outward Breathe out through your mouth and watch your belly move in. An audible "sh"  may help   Think of your belly as a balloon, when you fill with air (inhale), the balloon gets bigger. As the air goes out (exhale), the balloon deflates.   Practice breathing in and out in front of a mirror, watching your belly Breathe in for a count of 5 and breathe out for a count of 5  ________________________  Neck Stretches  Side to side: Drop your ear towards your right shoulder. Hold the stretch for 3-5 breaths. Drop your ear towards your left shoulder. Hold the stretch for 3-5 breaths. Do 3 times for each side.  Head turn: Turn and look towards your right shoulder. Hold the stretch for 3-5 breaths. Turn and look towards your left shoulder. Hold the stretch for 3-5 breaths. Do 3 times for each side.  Up and down: Tilt chin up. Hold for 3-5 breaths Tilt chin down. Hold for 3-5 breaths 3 times.  Yawn stretch: Open your mouth as if you are going to yawn, inhale and then sigh it out. Feel your throat widen and relax. Repeat 3 times.   Turn and look towards your left shoulder.

## 2022-04-13 NOTE — Therapy (Signed)
OUTPATIENT SPEECH LANGUAGE PATHOLOGY VOICE TREATMENT   Patient Name: Amber Miles MRN: [id number] DOB:1945-05-03, 77 y.o., female Today's Date: 04/13/2022  PCP: Loura Pardon, MD REFERRING PROVIDER: Carloyn Manner, MD   End of Session - 04/13/22 0810     Visit Number 2    Number of Visits 17    Date for SLP Re-Evaluation 07/09/22    SLP Start Time 1500    SLP Stop Time  1600    SLP Time Calculation (min) 60 min    Activity Tolerance Patient tolerated treatment well             Past Medical History:  Diagnosis Date   Allergy    allergic rhinitis   Anxiety    Arthritis    OA knee replacement   Cataract    Degenerative disc disease, thoracic    Family history of breast cancer    Family history of genetic disease carrier    paternal cousin has NBN mutation   GERD (gastroesophageal reflux disease)    Hyperlipidemia    Hypertension    Hypothyroid    Reactive airways dysfunction syndrome ([place])    Skin lesions, generalized    squamous cell skin lesions and basal cell skin lesions   Past Surgical History:  Procedure Laterality Date   ABDOMINAL HYSTERECTOMY  1990s   hyst with oophrectomy for B9lesion/cyst and prolapse   BREAST BIOPSY Bilateral    benign   JOINT REPLACEMENT  05/2009   left total knee replacement   recocele     and cystocele repair   TONSILLECTOMY     tsa     Patient Active Problem List   Diagnosis Date Noted   Upper respiratory infection, acute 10/03/2021   Oral candida 10/03/2021   Suspected COVID-19 virus infection 10/03/2021   Routine general medical examination at a health care facility 07/29/2019   Genetic testing 01/02/2018   Family history of breast cancer    Family history of genetic disease carrier    Meningioma ([place]) 07/17/2017   Fall 01/04/2017   Vasomotor symptoms due to menopause 06/26/2016   Atrophic vaginitis 09/01/2015   Pedal edema 05/27/2014   Estrogen deficiency 02/21/2013   Encounter for Medicare annual wellness exam  02/21/2013   Low back pain potentially associated with radiculopathy 10/11/2012   ALLERGIC RHINITIS 01/28/2010   Prediabetes 07/30/2009   ANXIETY 11/30/2008   Hyperlipidemia 04/13/2008   Hypothyroidism 01/07/2008   HYPERTENSION, BENIGN 01/07/2008   OSTEOARTHRITIS, GENERALIZED 01/07/2008    Onset date: January 2023  REFERRING DIAG: Muscle tension dysphonia  THERAPY DIAG:  Dysphonia  Other voice and resonance disorders  Rationale for Evaluation and Treatment Rehabilitation  SUBJECTIVE:   SUBJECTIVE STATEMENT: "I tried to practice the stretches you showed me." Pt accompanied by: self  PERTINENT HISTORY: Patient is a 77 y.o. 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 Yes: NPRS scale: 3/10 Pain location: right hip   PATIENT GOALS: Sing in church again  OBJECTIVE:    TODAY'S TREATMENT:  Initiated training in Insurance underwriter, handouts provided. Pt reports chronic coughing, several exacerbations noted in session today. Reviewed alternatives such as sipping water, pursed lip breathing, laryngeal relaxation. Reviewed neck and supplemental vocal tract relaxation  stretches and provided handout for home practice. Abdominal breathing training initiated, with pt ~60% in isolation. Provided instructions for home practice in supine position. Training in respiration/airflow sound coordination with /s/, until 90% acc. Transition to voiced sounds introduced laryngeal tension/strain, but improves with use of straw ~70% acc.   PATIENT EDUCATION: Education details: Insurance underwriter and abdominal breathing Person educated: Patient Education  method: Explanation Education comprehension: verbalized understanding   HOME EXERCISE PROGRAM: Vocal hygiene, abdominal breathing     GOALS: Goals reviewed with patient? Yes  SHORT TERM GOALS: Target date: 10 sessions  Patient will demonstrate laryngeal relaxation strategies with independence.  Baseline: Goal status: INITIAL  2.   Patient will implement vocal techniques (resonant voice, abdominal breathing, flow phonation) to reduce hyperfunction for [facility] pitch and vocal quality at sentence level 90% acc. Baseline:  Goal status: INITIAL  3.  Patient will complete home exercise program for voice with modified independence. Baseline:  Goal status: INITIAL    LONG TERM GOALS: Target date: 07/09/2022  Patient will implement vocal techniques (resonant voice, abdominal breathing, flow phonation) to reduce hyperfunction for [facility] pitch and vocal quality at conversation level 80% acc.  Baseline:  Goal status: INITIAL  2.  Patient will improve vocal quality and pitch to within normal range as measured by perceptual scales and acoustic/aerodynamic voice measures.  Baseline:  Goal status: INITIAL  3.  Patient will report improved voice quality of life as measured by Voice-Related Quality of Life scale. 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 She reports onset of voice problem following brochitis and flu in Jan 2023. Vocal quality is hoarse, gravelly, and she is noted to speak on residual capacity. Frequent/chronic cough is also noted. Initiated training in vocal hygiene and abdominal breathing, appears she may benefit from semi-occluded vocal tract exercises. I recommend course of ST focused on vocal hygiene, vocal retraining, and abdominal breathing to improve vocal quality for social and community activities.  OBJECTIVE IMPAIRMENTS include voice disorder. These  impairments are limiting patient from effectively communicating at home and in community. Factors affecting potential to achieve goals and functional outcome are  time post onset (>1 year) .Marland Kitchen Patient will benefit from skilled SLP services to address above impairments and improve overall function.  REHAB POTENTIAL: Good  PLAN: SLP FREQUENCY: 2x/week  SLP DURATION: 8 weeks  PLANNED INTERVENTIONS: Cueing hierachy, SLP instruction and feedback, Patient/family education, Re-evaluation, and laryngeal relaxation, vocal hygiene, vocal function exercises, abdominal breathing    Deneise Lever, MS, Actor [phone]

## 2022-04-17 ENCOUNTER — Ambulatory Visit: Payer: PPO | Attending: Otolaryngology | Admitting: Speech Pathology

## 2022-04-17 DIAGNOSIS — R498 Other voice and resonance disorders: Secondary | ICD-10-CM | POA: Diagnosis not present

## 2022-04-17 DIAGNOSIS — R490 Dysphonia: Secondary | ICD-10-CM | POA: Diagnosis not present

## 2022-04-17 NOTE — Patient Instructions (Signed)
Everyday sayings: Take your time, pause for a full breath before each word/phrase  Amber Miles How was your day? Happy Birthday!  Hey, how are ya? Do you want a hug?

## 2022-04-17 NOTE — Therapy (Signed)
OUTPATIENT SPEECH LANGUAGE PATHOLOGY VOICE TREATMENT   Patient Name: Amber Miles MRN: [id number] DOB:[birth year], 77 y.o., female Today's Date: 04/17/2022  PCP: Loura Pardon, MD REFERRING PROVIDER: Carloyn Manner, MD   End of Session - 04/17/22 1403     Visit Number 3    Number of Visits 17    Date for SLP Re-Evaluation 07/09/22    SLP Start Time 56    SLP Stop Time  1500    SLP Time Calculation (min) 60 min    Activity Tolerance Patient tolerated treatment well             Past Medical History:  Diagnosis Date   Allergy    allergic rhinitis   Anxiety    Arthritis    OA knee replacement   Cataract    Degenerative disc disease, thoracic    Family history of breast cancer    Family history of genetic disease carrier    paternal cousin has NBN mutation   GERD (gastroesophageal reflux disease)    Hyperlipidemia    Hypertension    Hypothyroid    Reactive airways dysfunction syndrome ([place])    Skin lesions, generalized    squamous cell skin lesions and basal cell skin lesions   Past Surgical History:  Procedure Laterality Date   ABDOMINAL HYSTERECTOMY  1990s   hyst with oophrectomy for B9lesion/cyst and prolapse   BREAST BIOPSY Bilateral    benign   JOINT REPLACEMENT  05/2009   left total knee replacement   recocele     and cystocele repair   TONSILLECTOMY     tsa     Patient Active Problem List   Diagnosis Date Noted   Upper respiratory infection, acute 10/03/2021   Oral candida 10/03/2021   Suspected COVID-19 virus infection 10/03/2021   Routine general medical examination at a health care facility 07/29/2019   Genetic testing 01/02/2018   Family history of breast cancer    Family history of genetic disease carrier    Meningioma 07/17/2017   Fall 01/04/2017   Vasomotor symptoms due to menopause 06/26/2016   Atrophic vaginitis 09/01/2015   Pedal edema 05/27/2014   Estrogen deficiency 02/21/2013   Encounter for Medicare annual wellness exam  02/21/2013   Low back pain potentially associated with radiculopathy 10/11/2012   ALLERGIC RHINITIS 01/28/2010   Prediabetes 07/30/2009   ANXIETY 11/30/2008   Hyperlipidemia 04/13/2008   Hypothyroidism 01/07/2008   HYPERTENSION, BENIGN 01/07/2008   OSTEOARTHRITIS, GENERALIZED 01/07/2008    Onset date: January 2023  REFERRING DIAG: Muscle tension dysphonia  THERAPY DIAG:  Dysphonia  Other voice and resonance disorders  Rationale for Evaluation and Treatment Rehabilitation  SUBJECTIVE:   SUBJECTIVE STATEMENT: "It comes and goes." (Re: voice) Pt accompanied by: self  PERTINENT HISTORY: Patient is a 77 y.o. 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 Yes: NPRS scale: 3/10 Pain location: right hip   PATIENT GOALS: Sing in church again  OBJECTIVE:    TODAY'S TREATMENT:  Pt reports minimal time for home practice over [place] weekend with family activities. She reports difficulty with abdominal breathing; began session supine on mat table for increased awareness of abdominal movement with inhale/exhale. Introduced coordination of respiration/phonation to begin  building awareness of speaking on residual capacity. Pt with coughing exacerbation in supine, so transitioned to upright position with focus of breath pacing at word and eventually phrase level. Pt required moderate cues to slow rate, allow for full breath before each utterance. Generated list of personally relevant words/phrases for home practice with abdominal breathing. Voiced /v/ was somewhat effective as reset for increased vocal tension.    PATIENT  EDUCATION: Education details: Insurance underwriter and abdominal breathing Person educated: Patient Education method: Explanation Education comprehension: verbalized understanding   HOME EXERCISE PROGRAM: Vocal hygiene, abdominal breathing     GOALS: Goals reviewed with patient? Yes  SHORT TERM GOALS: Target date: 10 sessions  Patient will demonstrate laryngeal relaxation strategies with independence.  Baseline: Goal status: INITIAL  2.   Patient will implement vocal techniques (resonant voice, abdominal breathing, flow phonation) to reduce hyperfunction for [facility] pitch and vocal quality at sentence level 90% acc. Baseline:  Goal status: INITIAL  3.  Patient will complete home exercise program for voice with modified independence. Baseline:  Goal status: INITIAL    LONG TERM GOALS: Target date: 07/09/2022  Patient will implement vocal techniques (resonant voice, abdominal breathing, flow phonation) to reduce hyperfunction for [facility] pitch and vocal quality at conversation level 80% acc.  Baseline:  Goal status: INITIAL  2.  Patient will improve vocal quality and pitch to within normal range as measured by perceptual scales and acoustic/aerodynamic voice measures.  Baseline:  Goal status: INITIAL  3.  Patient will report improved voice quality of life as measured by Voice-Related Quality of Life scale. 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 She reports onset of voice problem following brochitis and flu in Jan 2023. Vocal quality is hoarse, gravelly, and she is noted to speak on residual capacity. Frequent/chronic cough is also noted. Initiated training in vocal hygiene and abdominal breathing, appears she may benefit from semi-occluded vocal tract exercises. I recommend course of ST focused on vocal hygiene, vocal retraining, and abdominal breathing to improve  vocal quality for social and community activities.  OBJECTIVE IMPAIRMENTS include voice disorder. These impairments are limiting patient from effectively communicating at home and in community. Factors affecting potential to achieve goals and functional outcome are  time post onset (>1 year) .Marland Kitchen Patient will benefit from skilled SLP services to address above impairments and improve overall function.  REHAB POTENTIAL: Good  PLAN: SLP FREQUENCY: 2x/week  SLP DURATION: 8 weeks  PLANNED INTERVENTIONS: Cueing hierachy, SLP instruction and feedback, Patient/family education, Re-evaluation, and laryngeal relaxation, vocal hygiene, vocal function exercises, abdominal breathing    Deneise Lever, MS, Actor [phone]

## 2022-04-18 DIAGNOSIS — M5416 Radiculopathy, lumbar region: Secondary | ICD-10-CM | POA: Diagnosis not present

## 2022-04-18 DIAGNOSIS — M545 Low back pain, unspecified: Secondary | ICD-10-CM | POA: Diagnosis not present

## 2022-04-18 DIAGNOSIS — M47817 Spondylosis without myelopathy or radiculopathy, lumbosacral region: Secondary | ICD-10-CM | POA: Diagnosis not present

## 2022-04-19 ENCOUNTER — Ambulatory Visit: Payer: PPO | Admitting: Speech Pathology

## 2022-04-19 DIAGNOSIS — R490 Dysphonia: Secondary | ICD-10-CM

## 2022-04-19 DIAGNOSIS — R498 Other voice and resonance disorders: Secondary | ICD-10-CM

## 2022-04-19 NOTE — Therapy (Unsigned)
OUTPATIENT SPEECH LANGUAGE PATHOLOGY VOICE TREATMENT   Patient Name: Amber Miles MRN: [id number] DOB:01-26-1945, 77 y.o., female Today's Date: 04/17/2022  PCP: Loura Pardon, MD REFERRING PROVIDER: Carloyn Manner, MD   End of Session - 04/17/22 1403     Visit Number 3    Number of Visits 17    Date for SLP Re-Evaluation 07/09/22    SLP Start Time 45    SLP Stop Time  1500    SLP Time Calculation (min) 60 min    Activity Tolerance Patient tolerated treatment well             Past Medical History:  Diagnosis Date   Allergy    allergic rhinitis   Anxiety    Arthritis    OA knee replacement   Cataract    Degenerative disc disease, thoracic    Family history of breast cancer    Family history of genetic disease carrier    paternal cousin has NBN mutation   GERD (gastroesophageal reflux disease)    Hyperlipidemia    Hypertension    Hypothyroid    Reactive airways dysfunction syndrome ([place])    Skin lesions, generalized    squamous cell skin lesions and basal cell skin lesions   Past Surgical History:  Procedure Laterality Date   ABDOMINAL HYSTERECTOMY  1990s   hyst with oophrectomy for B9lesion/cyst and prolapse   BREAST BIOPSY Bilateral    benign   JOINT REPLACEMENT  05/2009   left total knee replacement   recocele     and cystocele repair   TONSILLECTOMY     tsa     Patient Active Problem List   Diagnosis Date Noted   Upper respiratory infection, acute 10/03/2021   Oral candida 10/03/2021   Suspected COVID-19 virus infection 10/03/2021   Routine general medical examination at a health care facility 07/29/2019   Genetic testing 01/02/2018   Family history of breast cancer    Family history of genetic disease carrier    Meningioma 07/17/2017   Fall 01/04/2017   Vasomotor symptoms due to menopause 06/26/2016   Atrophic vaginitis 09/01/2015   Pedal edema 05/27/2014   Estrogen deficiency 02/21/2013   Encounter for Medicare annual wellness exam  02/21/2013   Low back pain potentially associated with radiculopathy 10/11/2012   ALLERGIC RHINITIS 01/28/2010   Prediabetes 07/30/2009   ANXIETY 11/30/2008   Hyperlipidemia 04/13/2008   Hypothyroidism 01/07/2008   HYPERTENSION, BENIGN 01/07/2008   OSTEOARTHRITIS, GENERALIZED 01/07/2008    Onset date: January 2023  REFERRING DIAG: Muscle tension dysphonia  THERAPY DIAG:  Dysphonia  Other voice and resonance disorders  Rationale for Evaluation and Treatment Rehabilitation  SUBJECTIVE:   SUBJECTIVE STATEMENT: Pt appeared in a good mood, reported she was having issues with allergies due to the wind.  Pt accompanied by: self  PERTINENT HISTORY: Patient is a 77 y.o. 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 Yes: NPRS scale: 3/10 Pain location: right hip   PATIENT GOALS: Sing in church again  OBJECTIVE:    TODAY'S TREATMENT:  SLP provided skilled information and instruction in neck stretches. Pt independent in completing stretches, observed with relaxed posture and reported practicing stretches daily at home.  SLP  provided verbal and written education on abdominal breathing. Pt independent in abdominal breathing when SLP provided a more salient example of thinking of abdominal breathing as thought pt thinks of her back stretches.   Patient instructed in flow phonation through Skill level 1: Establish Airflow Release: Unarticulated: moderate assistance required to promote relaxed release of airflow; Articulated (unvoiced): moderate assistance faded to min assistance for exaggerated mouth movements and relaxed release of  airflow. Pt unable to move to voiced articulation.      PT given the following exercises to complete with voiceless phonation:   Sue-sue-sue   [place]  This or that   [facility]  Now or never   January   Pt independent in voiceless phonation at the multisyllabic and phrase level, maintained relaxed posture.   SLP further facilitated the session by provided pt a handout with sentences increasing in length to practice at home. Pt observed to be indepdent in voiceless phonation.    Pt observed with throat clearing throughout session. SLP provided extensive verbal and written information regarding vocal hygiene.    PATIENT EDUCATION: Education details: Insurance underwriter and abdominal breathing Person educated: Patient Education method: Explanation Education comprehension: verbalized understanding   HOME EXERCISE PROGRAM: Vocal hygiene, abdominal breathing Voiceless phonation of phrases and sentences      GOALS: Goals reviewed with patient? Yes  SHORT TERM GOALS: Target date: 10 sessions  Patient will demonstrate laryngeal relaxation strategies with independence.  Baseline: Goal status: INITIAL  2.   Patient will implement vocal techniques (resonant voice, abdominal breathing, flow phonation) to reduce hyperfunction for [facility] pitch and vocal quality at sentence level 90% acc. Baseline:  Goal status: INITIAL  3.  Patient will complete home exercise program for voice with modified independence. Baseline:  Goal status: INITIAL    LONG TERM GOALS: Target date: 07/09/2022  Patient will implement vocal techniques (resonant voice, abdominal breathing, flow phonation) to reduce hyperfunction for [facility] pitch and vocal quality at conversation level 80% acc.  Baseline:  Goal status: INITIAL  2.  Patient will improve vocal quality and pitch to within normal range as measured by perceptual scales and acoustic/aerodynamic voice measures.  Baseline:  Goal status: INITIAL  3.   Patient will report improved voice quality of life as measured by Voice-Related Quality of Life scale. 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 She reports onset of voice problem following brochitis and flu in Jan 2023. Vocal quality is hoarse, gravelly, and she is noted to speak on residual capacity. Frequent/chronic cough is also noted. Initiated training in vocal hygiene and abdominal breathing, appears she may benefit from semi-occluded vocal tract exercises. I recommend course of ST focused on vocal hygiene, vocal retraining, and abdominal breathing to improve vocal quality for social and community activities.  OBJECTIVE IMPAIRMENTS include voice disorder. These impairments are limiting patient from effectively communicating at home and in community. Factors affecting potential to achieve goals and functional outcome are  time post onset (>1 year) .Marland Kitchen Patient will benefit from skilled SLP services to address above impairments and improve overall function.  REHAB POTENTIAL: Good  PLAN: SLP FREQUENCY: 2x/week  SLP DURATION: 8 weeks  PLANNED INTERVENTIONS: Cueing hierachy, SLP instruction and feedback, Patient/family education, Re-evaluation, and laryngeal relaxation, vocal hygiene, vocal function exercises, abdominal breathing  Dacoda Spallone B. Rutherford Nail, M.S., CCC-SLP, [place] Pathologist Certified Brain Injury [facility]  Rehabilitation Services Office [phone] Ascom [phone] Fax [phone]

## 2022-04-21 DIAGNOSIS — M545 Low back pain, unspecified: Secondary | ICD-10-CM | POA: Diagnosis not present

## 2022-04-25 ENCOUNTER — Ambulatory Visit: Payer: PPO | Admitting: Speech Pathology

## 2022-04-25 DIAGNOSIS — R490 Dysphonia: Secondary | ICD-10-CM | POA: Diagnosis not present

## 2022-04-25 DIAGNOSIS — R498 Other voice and resonance disorders: Secondary | ICD-10-CM

## 2022-04-26 ENCOUNTER — Encounter: Payer: PPO | Admitting: Speech Pathology

## 2022-04-26 NOTE — Therapy (Signed)
OUTPATIENT SPEECH LANGUAGE PATHOLOGY VOICE TREATMENT   Patient Name: Amber Miles MRN: [id number] DOB:02-12-1945, 77 y.o., female Today's Date: 04/26/2022  PCP: Roxy Manns, MD REFERRING PROVIDER: Bud Face, MD   End of Session - 04/26/22 0853     Visit Number 5    Number of Visits 17    Date for SLP Re-Evaluation 07/09/22    SLP Start Time 1500    SLP Stop Time  1600    SLP Time Calculation (min) 60 min    Activity Tolerance Patient tolerated treatment well             Past Medical History:  Diagnosis Date   Allergy    allergic rhinitis   Anxiety    Arthritis    OA knee replacement   Cataract    Degenerative disc disease, thoracic    Family history of breast cancer    Family history of genetic disease carrier    paternal cousin has NBN mutation   GERD (gastroesophageal reflux disease)    Hyperlipidemia    Hypertension    Hypothyroid    Reactive airways dysfunction syndrome (HCC)    Skin lesions, generalized    squamous cell skin lesions and basal cell skin lesions   Past Surgical History:  Procedure Laterality Date   ABDOMINAL HYSTERECTOMY  1990s   hyst with oophrectomy for B9lesion/cyst and prolapse   BREAST BIOPSY Bilateral    benign   JOINT REPLACEMENT  05/2009   left total knee replacement   recocele     and cystocele repair   TONSILLECTOMY     tsa     Patient Active Problem List   Diagnosis Date Noted   Upper respiratory infection, acute 10/03/2021   Oral candida 10/03/2021   Suspected COVID-19 virus infection 10/03/2021   Routine general medical examination at a health care facility 07/29/2019   Genetic testing 01/02/2018   Family history of breast cancer    Family history of genetic disease carrier    Meningioma 07/17/2017   Fall 01/04/2017   Vasomotor symptoms due to menopause 06/26/2016   Atrophic vaginitis 09/01/2015   Pedal edema 05/27/2014   Estrogen deficiency 02/21/2013   Encounter for Medicare annual wellness exam  02/21/2013   Low back pain potentially associated with radiculopathy 10/11/2012   ALLERGIC RHINITIS 01/28/2010   Prediabetes 07/30/2009   ANXIETY 11/30/2008   Hyperlipidemia 04/13/2008   Hypothyroidism 01/07/2008   HYPERTENSION, BENIGN 01/07/2008   OSTEOARTHRITIS, GENERALIZED 01/07/2008    Onset date: January 2023  REFERRING DIAG: Muscle tension dysphonia  THERAPY DIAG:  Dysphonia  Other voice and resonance disorders  Rationale for Evaluation and Treatment Rehabilitation  SUBJECTIVE:   SUBJECTIVE STATEMENT: Pt reports coughing a lot due to allergies Pt accompanied by: self  PERTINENT HISTORY: Patient is a 77 y.o. 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 Yes: NPRS scale: 3/10 Pain location: right hip   PATIENT GOALS: Sing in church again  OBJECTIVE:    TODAY'S TREATMENT:  Pt reports continued practice with neck stretches and abdominal breathing at home; she is completing breathing when laying down for her other exercises. Reports she is trying to be more aware of speaking on residual capacity, however  she was noted to do this frequently during today's session in spontaneous conversation between structured tasks. Guided pt through relaxation and vocal warm up; pt independent with neck and supplemental vocal tract relaxation stretches. Pt reports difficulty with abdominal breathing in isolation with cough exacerbation (she feels due to allergies, asthma). Reviewed pursed lip breathing for cough suppression. With airflow tasks, pt uses appropriate breath support in 10/10 opportunities, however continues with difficulty when  phonation introduced. Utilized carrier voiceless sound to promote easier onset phonation ("Whooo"), however even with semi-occluded vocal tract (straw phonation, straw in water), tension with onset of voicing occurred 60% of the time. Frequent throat clearing/coughing due to allergies appears to exacerbate tension. Focused remainder of session on vocal hygiene and breath support at phrase level; pt required mod cues to avoid speaking on residual capacity in structured phrase level tasks.  PATIENT EDUCATION: Education details: Higher education careers adviser and abdominal breathing Person educated: Patient Education method: Explanation Education comprehension: verbalized understanding   HOME EXERCISE PROGRAM: Vocal hygiene, abdominal breathing Voiceless phonation of phrases and sentences    GOALS: Goals reviewed with patient? Yes  SHORT TERM GOALS: Target date: 10 sessions  Patient will demonstrate laryngeal relaxation strategies with independence.  Baseline: Goal status: INITIAL  2.   Patient will implement vocal techniques (resonant voice, abdominal breathing, flow phonation) to reduce hyperfunction for [facility] [place] pitch and vocal quality at sentence level 90% acc. Baseline:  Goal status: INITIAL  3.  Patient will complete home exercise program for voice with modified independence. Baseline:  Goal status: INITIAL    LONG TERM GOALS: Target date: 07/09/2022  Patient will implement vocal techniques (resonant voice, abdominal breathing, flow phonation) to reduce hyperfunction for [facility] pitch and vocal quality at conversation level 80% acc.  Baseline:  Goal status: INITIAL  2.  Patient will improve vocal quality and pitch to within normal range as measured by perceptual scales and acoustic/aerodynamic voice measures.  Baseline:  Goal status: INITIAL  3.  Patient will report improved voice quality of life as measured by Voice-Related Quality of Life scale. 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 She reports onset of voice problem following brochitis and flu in Jan 2023. Vocal quality is hoarse, gravelly, and she is noted to speak on residual capacity. Frequent/chronic cough is also noted; exacerbated in recent session due to allergies/pollen. Initiated training in vocal hygiene and abdominal breathing, appears she may benefit from semi-occluded vocal tract exercises. I recommend course of ST focused on vocal hygiene, vocal retraining, and abdominal breathing to improve vocal quality for social and community activities.  OBJECTIVE IMPAIRMENTS include voice disorder. These impairments are limiting patient from effectively communicating at home and in community. Factors affecting potential to achieve goals and functional outcome are  time post onset (>1 year) .Marland Kitchen Patient will benefit from skilled SLP services to address above impairments and improve overall function.  REHAB POTENTIAL: Good  PLAN: SLP FREQUENCY: 2x/week  SLP DURATION: 8 weeks  PLANNED INTERVENTIONS: Cueing hierachy, SLP instruction and feedback, Patient/family education, Re-evaluation, and laryngeal relaxation, vocal hygiene, vocal function exercises, abdominal breathing  Rondel Baton, [place], Sports administrator [phone]  [facility]  [facility] Office [phone] Fax [phone]

## 2022-04-27 ENCOUNTER — Encounter: Payer: PPO | Admitting: Speech Pathology

## 2022-04-27 DIAGNOSIS — H401131 Primary open-angle glaucoma, bilateral, mild stage: Secondary | ICD-10-CM | POA: Diagnosis not present

## 2022-04-27 DIAGNOSIS — H524 Presbyopia: Secondary | ICD-10-CM | POA: Diagnosis not present

## 2022-04-27 DIAGNOSIS — H2513 Age-related nuclear cataract, bilateral: Secondary | ICD-10-CM | POA: Diagnosis not present

## 2022-05-01 ENCOUNTER — Ambulatory Visit: Payer: PPO | Admitting: Speech Pathology

## 2022-05-01 DIAGNOSIS — R490 Dysphonia: Secondary | ICD-10-CM | POA: Diagnosis not present

## 2022-05-01 DIAGNOSIS — R498 Other voice and resonance disorders: Secondary | ICD-10-CM

## 2022-05-01 NOTE — Therapy (Signed)
OUTPATIENT SPEECH LANGUAGE PATHOLOGY VOICE TREATMENT   Patient Name: Amber Miles MRN: [id number] DOB:1945/05/28, 77 y.o., female Today's Date: 05/01/2022  PCP: Roxy Manns, MD REFERRING PROVIDER: Bud Face, MD   End of Session - 05/01/22 1505     Visit Number 6    Number of Visits 17    Date for SLP Re-Evaluation 07/09/22    SLP Start Time 1500    SLP Stop Time  1600    SLP Time Calculation (min) 60 min    Activity Tolerance Patient tolerated treatment well             Past Medical History:  Diagnosis Date   Allergy    allergic rhinitis   Anxiety    Arthritis    OA knee replacement   Cataract    Degenerative disc disease, thoracic    Family history of breast cancer    Family history of genetic disease carrier    paternal cousin has NBN mutation   GERD (gastroesophageal reflux disease)    Hyperlipidemia    Hypertension    Hypothyroid    Reactive airways dysfunction syndrome (HCC)    Skin lesions, generalized    squamous cell skin lesions and basal cell skin lesions   Past Surgical History:  Procedure Laterality Date   ABDOMINAL HYSTERECTOMY  1990s   hyst with oophrectomy for B9lesion/cyst and prolapse   BREAST BIOPSY Bilateral    benign   JOINT REPLACEMENT  05/2009   left total knee replacement   recocele     and cystocele repair   TONSILLECTOMY     tsa     Patient Active Problem List   Diagnosis Date Noted   Upper respiratory infection, acute 10/03/2021   Oral candida 10/03/2021   Suspected COVID-19 virus infection 10/03/2021   Routine general medical examination at a health care facility 07/29/2019   Genetic testing 01/02/2018   Family history of breast cancer    Family history of genetic disease carrier    Meningioma 07/17/2017   Fall 01/04/2017   Vasomotor symptoms due to menopause 06/26/2016   Atrophic vaginitis 09/01/2015   Pedal edema 05/27/2014   Estrogen deficiency 02/21/2013   Encounter for Medicare annual wellness exam  02/21/2013   Low back pain potentially associated with radiculopathy 10/11/2012   ALLERGIC RHINITIS 01/28/2010   Prediabetes 07/30/2009   ANXIETY 11/30/2008   Hyperlipidemia 04/13/2008   Hypothyroidism 01/07/2008   HYPERTENSION, BENIGN 01/07/2008   OSTEOARTHRITIS, GENERALIZED 01/07/2008    Onset date: January 2023  REFERRING DIAG: Muscle tension dysphonia  THERAPY DIAG:  Dysphonia  Other voice and resonance disorders  Rationale for Evaluation and Treatment Rehabilitation  SUBJECTIVE:   SUBJECTIVE STATEMENT: Pt reports spending time outside exacerbates her cough. Pt accompanied by: self  PERTINENT HISTORY: Patient is a 77 y.o. 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 Yes: NPRS scale: 3/10 Pain location: right hip   PATIENT GOALS: Sing in church again  OBJECTIVE:    TODAY'S TREATMENT:  Due to several coughing episodes in initial minutes of therapy, initialy targeted cough suppression strategies (sniff-blow, straw exhale) and instructed pt to use these as preventative if possible, or to complete immediately post-cough to reduce tension/strain and  reduce exacerbation. After initial instruction, pt implemented this with occasional min visual cues. Utilized laryngeal relaxation, as well as semi-occluded vocal tract (straw phonation, cup bubbles, voiced /v/), resonant voice, and flow phonation, with marginal/intermittent success. Apparent obstruction of airflow when initiating phonation tasks: gentler voicing/easy onset was somewhat effective, however noted increased strain as task progressed. Shifted focus to target pt  awareness of "smooth" vs "forced" onset and she began terminating efforts when increased strain occurred with min cues.   PATIENT EDUCATION: Education details: vocal hygiene and abdominal breathing, only do phonation tasks at home if able to do so without strain Person educated: Patient Education method: Explanation Education comprehension: verbalized understanding   HOME EXERCISE PROGRAM: Vocal hygiene, abdominal breathing Voiceless phonation of phrases and sentences    GOALS: Goals reviewed with patient? Yes  SHORT TERM GOALS: Target date: 10 sessions  Patient will demonstrate laryngeal relaxation strategies with independence.  Baseline: Goal status: INITIAL  2.   Patient will implement vocal techniques (resonant voice, abdominal breathing, flow phonation) to reduce hyperfunction for [facility] pitch and vocal quality at sentence level 90% acc. Baseline:  Goal status: INITIAL  3.  Patient will complete home exercise program for voice with modified independence. Baseline:  Goal status: INITIAL    LONG TERM GOALS: Target date: 07/09/2022  Patient will implement vocal techniques (resonant voice, abdominal breathing, flow phonation) to reduce hyperfunction for [facility] pitch and vocal quality at conversation level 80% acc.  Baseline:  Goal status: INITIAL  2.  Patient will improve vocal quality and pitch to within normal range as measured by perceptual scales and acoustic/aerodynamic voice measures.  Baseline:  Goal status: INITIAL  3.  Patient will report improved voice quality of life as measured by Voice-Related Quality of Life scale. 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 She reports onset of voice problem following brochitis and flu in Jan 2023. Vocal quality is hoarse, gravelly, and she is noted to speak on residual capacity. Frequent/chronic cough is  also noted; exacerbated in recent session due to allergies/pollen. Semi-occluded vocal tract exercises, resonant voice, and flow phonation attempted today with some success in sustained phonation tasks, however significant degree of strain persists. I recommend course of ST focused on vocal hygiene, vocal retraining, and abdominal breathing to improve vocal quality for social and community activities.  OBJECTIVE IMPAIRMENTS include voice disorder. These impairments are limiting patient from effectively communicating at home and in community. Factors affecting potential to achieve goals and functional outcome are  time post onset (>1 year) .Marland Kitchen Patient will benefit from skilled SLP services to address above impairments and improve overall function.  REHAB POTENTIAL: Good  PLAN: SLP FREQUENCY: 2x/week  SLP DURATION: 8 weeks  PLANNED INTERVENTIONS: Cueing hierachy, SLP instruction and feedback, Patient/family education, Re-evaluation, and laryngeal relaxation, vocal hygiene, vocal function exercises, abdominal breathing  Rondel Baton, [place], Sports administrator [phone]  [facility]  [facility] Services Office [phone] Fax [phone]

## 2022-05-01 NOTE — Patient Instructions (Signed)
Voice rest, as much as possible. Keep doing your stretches and practicing abdominal breathing. Sniff-blow to prevent cough, or to relax right after coughing (you can also do this through the straw).  Phonation tasks: only do voice sound practice if you are able to get a smooth, clearish voice quality (no sound/air stopping)  Suggestions of things to try (1-3 times, then quit if no improvement in your smoothness)   -[id number]   -Mmmmmmmmmmm   -Whoooooooo  Different ways to try (options... what works best?): Through a straw sticking about 1 in down into a glass of water, through the straw by itself, or with no straw   If none of these work, just do breathing and stretching.

## 2022-05-02 DIAGNOSIS — M47817 Spondylosis without myelopathy or radiculopathy, lumbosacral region: Secondary | ICD-10-CM | POA: Diagnosis not present

## 2022-05-03 ENCOUNTER — Ambulatory Visit: Payer: PPO | Admitting: Speech Pathology

## 2022-05-03 DIAGNOSIS — R498 Other voice and resonance disorders: Secondary | ICD-10-CM

## 2022-05-03 DIAGNOSIS — R490 Dysphonia: Secondary | ICD-10-CM | POA: Diagnosis not present

## 2022-05-03 NOTE — Therapy (Unsigned)
OUTPATIENT SPEECH LANGUAGE PATHOLOGY VOICE TREATMENT   Patient Name: Amber Miles MRN: [id number] DOB:07/21/1945, 77 y.o., female Today's Date: 05/03/2022  PCP: Roxy Manns, MD REFERRING PROVIDER: Bud Face, MD   End of Session - 05/03/22 1634     Visit Number 7    Number of Visits 17    Date for SLP Re-Evaluation 07/09/22    SLP Start Time 1400    SLP Stop Time  1500    SLP Time Calculation (min) 60 min    Activity Tolerance Patient tolerated treatment well             Past Medical History:  Diagnosis Date   Allergy    allergic rhinitis   Anxiety    Arthritis    OA knee replacement   Cataract    Degenerative disc disease, thoracic    Family history of breast cancer    Family history of genetic disease carrier    paternal cousin has NBN mutation   GERD (gastroesophageal reflux disease)    Hyperlipidemia    Hypertension    Hypothyroid    Reactive airways dysfunction syndrome (HCC)    Skin lesions, generalized    squamous cell skin lesions and basal cell skin lesions   Past Surgical History:  Procedure Laterality Date   ABDOMINAL HYSTERECTOMY  1990s   hyst with oophrectomy for B9lesion/cyst and prolapse   BREAST BIOPSY Bilateral    benign   JOINT REPLACEMENT  05/2009   left total knee replacement   recocele     and cystocele repair   TONSILLECTOMY     tsa     Patient Active Problem List   Diagnosis Date Noted   Upper respiratory infection, acute 10/03/2021   Oral candida 10/03/2021   Suspected COVID-19 virus infection 10/03/2021   Routine general medical examination at a health care facility 07/29/2019   Genetic testing 01/02/2018   Family history of breast cancer    Family history of genetic disease carrier    Meningioma 07/17/2017   Fall 01/04/2017   Vasomotor symptoms due to menopause 06/26/2016   Atrophic vaginitis 09/01/2015   Pedal edema 05/27/2014   Estrogen deficiency 02/21/2013   Encounter for Medicare annual wellness exam  02/21/2013   Low back pain potentially associated with radiculopathy 10/11/2012   ALLERGIC RHINITIS 01/28/2010   Prediabetes 07/30/2009   ANXIETY 11/30/2008   Hyperlipidemia 04/13/2008   Hypothyroidism 01/07/2008   HYPERTENSION, BENIGN 01/07/2008   OSTEOARTHRITIS, GENERALIZED 01/07/2008    Onset date: January 2023  REFERRING DIAG: Muscle tension dysphonia  THERAPY DIAG:  Dysphonia  Other voice and resonance disorders  Rationale for Evaluation and Treatment Rehabilitation  SUBJECTIVE:   SUBJECTIVE STATEMENT: Pt reports spending time outside exacerbates her cough. Pt accompanied by: self  PERTINENT HISTORY: Patient is a 77 y.o. 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 Yes: NPRS scale: 3/10 Pain location: right hip   PATIENT GOALS: Sing in church again  OBJECTIVE:    TODAY'S TREATMENT:  Due to several coughing episodes in initial minutes of therapy, initialy targeted cough suppression strategies (sniff-blow, straw exhale) and instructed pt to use these as preventative if possible, or to complete immediately post-cough to reduce tension/strain and  reduce exacerbation. After initial instruction, pt implemented this with occasional min visual cues. Utilized laryngeal relaxation, as well as semi-occluded vocal tract (straw phonation, cup bubbles, voiced /v/), resonant voice, and flow phonation, with marginal/intermittent success. Apparent obstruction of airflow when initiating phonation tasks: gentler voicing/easy onset was somewhat effective, however noted increased strain as task progressed. Shifted focus to target pt  awareness of "smooth" vs "forced" onset and she began terminating efforts when increased strain occurred with min cues.   PATIENT EDUCATION: Education details: vocal hygiene and abdominal breathing, only do phonation tasks at home if able to do so without strain Person educated: Patient Education method: Explanation Education comprehension: verbalized understanding   HOME EXERCISE PROGRAM: Vocal hygiene, abdominal breathing Voiceless phonation of phrases and sentences    GOALS: Goals reviewed with patient? Yes  SHORT TERM GOALS: Target date: 10 sessions  Patient will demonstrate laryngeal relaxation strategies with independence.  Baseline: Goal status: INITIAL  2.   Patient will implement vocal techniques (resonant voice, abdominal breathing, flow phonation) to reduce hyperfunction for [facility] pitch and vocal quality at sentence level 90% acc. Baseline:  Goal status: INITIAL  3.  Patient will complete home exercise program for voice with modified independence. Baseline:  Goal status: INITIAL    LONG TERM GOALS: Target date: 07/09/2022  Patient will implement vocal techniques (resonant voice, abdominal breathing, flow phonation) to reduce hyperfunction for [facility] pitch and vocal quality at conversation level 80% acc.  Baseline:  Goal status: INITIAL  2.  Patient will improve vocal quality and pitch to within normal range as measured by perceptual scales and acoustic/aerodynamic voice measures.  Baseline:  Goal status: INITIAL  3.  Patient will report improved voice quality of life as measured by Voice-Related Quality of Life scale. 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 She reports onset of voice problem following brochitis and flu in Jan 2023. Vocal quality is hoarse, gravelly, and she is noted to speak on residual capacity. Frequent/chronic cough is  also noted; exacerbated in recent session due to allergies/pollen. Semi-occluded vocal tract exercises, resonant voice, and flow phonation attempted today with some success in sustained phonation tasks, however significant degree of strain persists. I recommend course of ST focused on vocal hygiene, vocal retraining, and abdominal breathing to improve vocal quality for social and community activities.  OBJECTIVE IMPAIRMENTS include voice disorder. These impairments are limiting patient from effectively communicating at home and in community. Factors affecting potential to achieve goals and functional outcome are  time post onset (>1 year) .Marland Kitchen Patient will benefit from skilled SLP services to address above impairments and improve overall function.  REHAB POTENTIAL: Good  PLAN: SLP FREQUENCY: 2x/week  SLP DURATION: 8 weeks  PLANNED INTERVENTIONS: Cueing hierachy, SLP instruction and feedback, Patient/family education, Re-evaluation, and laryngeal relaxation, vocal hygiene, vocal function exercises, abdominal breathing  Rondel Baton, [place], Sports administrator [phone]  [facility]  [facility] Office [phone] Fax [phone]

## 2022-05-08 ENCOUNTER — Ambulatory Visit: Payer: PPO | Admitting: Speech Pathology

## 2022-05-08 ENCOUNTER — Telehealth: Payer: Self-pay | Admitting: Family Medicine

## 2022-05-08 ENCOUNTER — Ambulatory Visit (INDEPENDENT_AMBULATORY_CARE_PROVIDER_SITE_OTHER): Payer: PPO | Admitting: Family Medicine

## 2022-05-08 ENCOUNTER — Encounter: Payer: Self-pay | Admitting: Family Medicine

## 2022-05-08 ENCOUNTER — Encounter: Payer: Self-pay | Admitting: Speech Pathology

## 2022-05-08 VITALS — BP 132/60 | HR 63 | Temp 97.8°F | Ht 67.0 in | Wt 241.0 lb

## 2022-05-08 DIAGNOSIS — R490 Dysphonia: Secondary | ICD-10-CM

## 2022-05-08 DIAGNOSIS — N3001 Acute cystitis with hematuria: Secondary | ICD-10-CM

## 2022-05-08 DIAGNOSIS — R300 Dysuria: Secondary | ICD-10-CM

## 2022-05-08 DIAGNOSIS — R498 Other voice and resonance disorders: Secondary | ICD-10-CM

## 2022-05-08 DIAGNOSIS — N390 Urinary tract infection, site not specified: Secondary | ICD-10-CM | POA: Insufficient documentation

## 2022-05-08 LAB — POC URINALSYSI DIPSTICK (AUTOMATED)
Bilirubin, UA: NEGATIVE
Blood, UA: 100
Glucose, UA: NEGATIVE
Ketones, UA: NEGATIVE
Nitrite, UA: NEGATIVE
Protein, UA: NEGATIVE
Spec Grav, UA: 1.01 (ref 1.010–1.025)
Urobilinogen, UA: 0.2 E.U./dL
pH, UA: 6 (ref 5.0–8.0)

## 2022-05-08 MED ORDER — CEPHALEXIN 500 MG PO CAPS: 500.0000 mg | ORAL_CAPSULE | Freq: Three times a day (TID) | ORAL | 0 refills | Status: DC

## 2022-05-08 NOTE — Telephone Encounter (Signed)
Pt called back stating she was told by access nurse to scheduled a ov for today. Scheduled pt for today, 4/22 @ 12:30 with [facility]. Call back # [phone]

## 2022-05-08 NOTE — Telephone Encounter (Signed)
Will see her then 

## 2022-05-08 NOTE — Telephone Encounter (Signed)
Patient called in stating that she had pink in her urine with a little stomach pain on Saturday. She did not have any on Sunday. I sent her to access nurse to be triaged.

## 2022-05-08 NOTE — Progress Notes (Signed)
Subjective:    Patient ID: Amber Miles, female    DOB: 14-Feb-1945, 77 y.o.   MRN: [id number]  HPI Pt presents with urinary symptoms   Wt Readings from Last 3 Encounters:  05/08/22 241 lb (109.3 kg)  10/05/21 231 lb 6 oz (105 kg)  10/03/21 234 lb 2 oz (106.2 kg)   37.75 kg/m  Vitals:   05/08/22 1224  BP: (!) 148/68  Pulse: 63  Temp: 97.8 F (36.6 C)  SpO2: 99%    Pt notes ? Hematuria- pink colored urine- started sat am  Now drinking lots of water and it is more clear   Dysuria - burns after she stands up after urinating  Frequency  Urgency  Some abd discomfort -over bladder area   No nausea No fever  No flank pain   She does use an estrogen patch 0.0375 mg    HTN bp is stable today  No cp or palpitations or headaches or edema  No side effects to medicines  BP Readings from Last 3 Encounters:  05/08/22 132/60  10/05/21 139/80  10/03/21 (!) 124/58      Takes Verapamil SR 240 mg daily  Lisinopril 5 mg daily  Triam hct 37.5-25 mg daily   Results for orders placed or performed in visit on 05/08/22  POCT Urinalysis Dipstick (Automated)  Result Value Ref Range   Color, UA Yellow    Clarity, UA Cloudy    Glucose, UA Negative Negative   Bilirubin, UA Negative    Ketones, UA Negative    Spec Grav, UA 1.010 1.010 - 1.025   Blood, UA 100 Ery/uL    pH, UA 6.0 5.0 - 8.0   Protein, UA Negative Negative   Urobilinogen, UA 0.2 0.2 or 1.0 E.U./dL   Nitrite, UA Negative    Leukocytes, UA Large (3+) (A) Negative      Dealing with chronic hoarsness Since illness a year ago  Working with ENT and speech   Lab Results  Component Value Date   CREATININE 1.17 10/03/2021   BUN 24 (H) 10/03/2021   NA 139 10/03/2021   K 4.2 10/03/2021   CL 105 10/03/2021   CO2 26 10/03/2021    Patient Active Problem List   Diagnosis Date Noted   UTI (urinary tract infection) 05/08/2022   Hoarseness of voice 05/08/2022   Upper respiratory infection, acute 10/03/2021    Oral candida 10/03/2021   Suspected COVID-19 virus infection 10/03/2021   Routine general medical examination at a health care facility 07/29/2019   Genetic testing 01/02/2018   Family history of breast cancer    Family history of genetic disease carrier    Meningioma 07/17/2017   Fall 01/04/2017   Vasomotor symptoms due to menopause 06/26/2016   Atrophic vaginitis 09/01/2015   Pedal edema 05/27/2014   Estrogen deficiency 02/21/2013   Encounter for Medicare annual wellness exam 02/21/2013   Low back pain potentially associated with radiculopathy 10/11/2012   ALLERGIC RHINITIS 01/28/2010   Prediabetes 07/30/2009   ANXIETY 11/30/2008   Hyperlipidemia 04/13/2008   Hypothyroidism 01/07/2008   HYPERTENSION, BENIGN 01/07/2008   OSTEOARTHRITIS, GENERALIZED 01/07/2008   Past Medical History:  Diagnosis Date   Allergy    allergic rhinitis   Anxiety    Arthritis    OA knee replacement   Cataract    Degenerative disc disease, thoracic    Family history of breast cancer    Family history of genetic disease carrier    paternal cousin has NBN  mutation   GERD (gastroesophageal reflux disease)    Hyperlipidemia    Hypertension    Hypothyroid    Reactive airways dysfunction syndrome    Skin lesions, generalized    squamous cell skin lesions and basal cell skin lesions   Past Surgical History:  Procedure Laterality Date   ABDOMINAL HYSTERECTOMY  1990s   hyst with oophrectomy for B9lesion/cyst and prolapse   BREAST BIOPSY Bilateral    benign   JOINT REPLACEMENT  05/2009   left total knee replacement   recocele     and cystocele repair   TONSILLECTOMY     tsa     Social History   Tobacco Use   Smoking status: Never   Smokeless tobacco: Never  Vaping Use   Vaping Use: Never used  Substance Use Topics   Alcohol use: No    Alcohol/week: 0.0 standard drinks of alcohol   Drug use: No   Family History  Problem Relation Age of Onset   Hypertension Mother    Stroke Mother     Depression Mother    Alzheimer's disease Mother    Heart disease Father    Cerebral palsy Father    Hypertension Sister    Breast cancer Cousin 99       NBN mutation   Melanoma Maternal Uncle        dx >50   Allergies  Allergen Reactions   Neosporin [Neomycin-Polymyxin-Gramicidin] Other (See Comments)    blisters   Tape Swelling    Itching and redness Pt said severe reaction to surgical tape.? latex   Current Outpatient Medications on File Prior to Visit  Medication Sig Dispense Refill   Acetaminophen-guaiFENesin (MUCINEX COLD & FLU PO) Take by mouth.     acyclovir (ZOVIRAX) 800 MG tablet TAKE 1 TABLET BY MOUTH 3 TIMES DAILY FOR 3 DAYS FLARES  2   albuterol (VENTOLIN HFA) 108 (90 Base) MCG/ACT inhaler INHALE 1 TO 2 PUFFS EVERY 4 TO 6 HOURS AS NEEDED FOR WHEEZE OR COUGH 8.5 each 1   amoxicillin (AMOXIL) 500 MG capsule Take 500 mg by mouth as directed.     ARNUITY ELLIPTA 200 MCG/ACT AEPB Inhale 1 puff into the lungs daily.     azelastine (ASTELIN) 0.1 % nasal spray Place 1 spray into both nostrils 2 (two) times daily.     Dextromethorphan-guaiFENesin (DELSYM COUGH/CHEST CONGEST DM) 5-100 MG/5ML LIQD Take by mouth.     Docusate Calcium (STOOL SOFTENER PO) Take 3 tablets by mouth daily as needed.     esomeprazole (NEXIUM) 40 MG capsule TAKE 1 CAPSULE BY MOUTH EVERY DAY BEFORE BREAKFAST 90 capsule 1   estradiol (VIVELLE-DOT) 0.0375 MG/24HR PLACE 1 PATCH ONTO THE SKIN 2 TIMES A WEEK. 24 patch 1   GLUCOSAMINE-CHONDROITIN-VIT D3 PO Take 1 capsule by mouth 2 (two) times daily.     lisinopril (ZESTRIL) 5 MG tablet TAKE 1 TABLET BY MOUTH EVERY DAY 90 tablet 1   montelukast (SINGULAIR) 10 MG tablet Take 10 mg by mouth daily.      Multiple Vitamin (MULTIVITAMIN) tablet Take 1 tablet by mouth daily.       naproxen sodium (ALEVE) 220 MG tablet Take 220 mg by mouth every 12 (twelve) hours.     potassium chloride SA (KLOR-CON M20) 20 MEQ tablet TAKE 1 TABLET BY MOUTH EVERY DAY 90 tablet 3    SYNTHROID 112 MCG tablet Take 112 mcg by mouth daily.  11   tiZANidine (ZANAFLEX) 4 MG tablet TAKE 1 TABLET  BY MOUTH EVERY 6 HOURS AS NEEDED 360 tablet 0   traMADol (ULTRAM) 50 MG tablet TAKE 1-2 TABLETS BY MOUTH EVERY 8 HOURS AS NEEDED FOR SEVERE(SHOULDER PAIN). CAUTION OF SEDATION 30 tablet 0   triamterene-hydrochlorothiazide (MAXZIDE-25) 37.5-25 MG tablet TAKE 2 TABLETS BY MOUTH EVERY DAY 180 tablet 1   verapamil (CALAN-SR) 240 MG CR tablet TAKE 1 TABLET (240 MG TOTAL) BY MOUTH 2 (TWO) TIMES DAILY. 180 tablet 2   No current facility-administered medications on file prior to visit.    Review of Systems  Constitutional:  Positive for fatigue. Negative for activity change, appetite change and fever.  HENT:  Negative for congestion and sore throat.   Eyes:  Negative for itching and visual disturbance.  Respiratory:  Negative for cough and shortness of breath.   Cardiovascular:  Negative for leg swelling.  Gastrointestinal:  Negative for abdominal distention, abdominal pain, constipation, diarrhea and nausea.  Endocrine: Negative for cold intolerance and polydipsia.  Genitourinary:  Positive for dysuria, frequency, hematuria and urgency. Negative for difficulty urinating and flank pain.  Musculoskeletal:  Negative for myalgias.  Skin:  Negative for rash.  Allergic/Immunologic: Negative for immunocompromised state.  Neurological:  Negative for dizziness and weakness.  Hematological:  Negative for adenopathy.       Objective:   Physical Exam Constitutional:      General: She is not in acute distress.    Appearance: She is well-developed. She is obese.  HENT:     Head: Normocephalic and atraumatic.     Mouth/Throat:     Mouth: Mucous membranes are moist.     Pharynx: Oropharynx is clear.     Comments: Voice is mildly hoarse Eyes:     Conjunctiva/sclera: Conjunctivae normal.     Pupils: Pupils are equal, round, and reactive to light.  Cardiovascular:     Rate and Rhythm: Normal rate  and regular rhythm.     Heart sounds: Normal heart sounds.  Pulmonary:     Effort: Pulmonary effort is normal.     Breath sounds: Normal breath sounds.  Abdominal:     General: Bowel sounds are normal. There is no distension.     Palpations: Abdomen is soft.     Tenderness: There is abdominal tenderness. There is no right CVA tenderness, left CVA tenderness or rebound.     Comments: No cva tenderness  Mild suprapubic tenderness  Musculoskeletal:     Cervical back: Normal range of motion and neck supple.  Lymphadenopathy:     Cervical: No cervical adenopathy.  Skin:    Findings: No rash.  Neurological:     Mental Status: She is alert.           Assessment & Plan:   Problem List Items Addressed This Visit       Genitourinary   UTI (urinary tract infection) - Primary    First ? Uti  Pink urine/ dysuria and bladder pain  Ua pos for wbc, blood Cx pending Tx with keflex 500 mg tid 7 d Inst to call if sympt worsen while cx is pending  Er precautions noted  Handout given Enc good water intake       Relevant Medications   cephALEXin (KEFLEX) 500 MG capsule   Other Relevant Orders   Urine Culture     Other   Hoarseness of voice    Ongoing since illness a y ago  Saw ENT Working with speech path and making progress      Other Visit Diagnoses  Dysuria       Relevant Orders   POCT Urinalysis Dipstick (Automated) (Completed)

## 2022-05-08 NOTE — Telephone Encounter (Signed)
[facility] - Client TELEPHONE ADVICE RECORD Access[facility]urse Patient Name: Amber Miles Gender: Female DOB: September 14, 1945 Age: 77 Y 4 M 4 D Return Phone Number: [phone] (Primary), [phone] (Secondary) Address[place]  [address] Client [facility] - Client Client Site [facility] - Day Provider Milinda Antis, Idamae Schuller - MD Contact Type Call Who Is Calling Patient / Member / Family / Caregiver Call Type Triage / Clinical Relationship To Patient Self Return Phone Number [phone] (Primary) Chief Complaint Urine - unusual color Reason for Call Symptomatic / Request for Health Information Initial Comment Caller states patient needs to be triaged. Patient is having pink in her urine and stomach pains on Saturday. Wants to know if she needs to comie in and be checked out. Translation No Nurse Assessment Nurse: Leonie Douglas, RN, Ashlynn Date/Time (Eastern Time): 05/08/2022 8:36:39 AM Confirm and document reason for call. If symptomatic, describe symptoms. ---caller states that there is burning after urination. there is frequency and urgency. caller states that she saw pink in her urine on Saturday. none since then. Does the patient have any new or worsening symptoms? ---Yes Will a triage be completed? ---Yes Related visit to physician within the last 2 weeks? ---No Does the PT have any chronic conditions? (i.e. diabetes, asthma, this includes High risk factors for pregnancy, etc.) ---Yes List chronic conditions. ---asthma, HTN, Is this a behavioral health or substance abuse call? ---No Guidelines Guideline Title Affirmed Question Affirmed Notes Nurse Date/Time (Eastern Time) Urine - Blood In Pain or burning with passing urine Leonie Douglas, RN, Ashlynn 05/08/2022 8:38:28 AM Disp. Time Lamount Cohen Time) Disposition Final User 05/08/2022 8:42:07 AM See PCP within 24 Hours Yes Leonie Douglas, RN,  Ashlynn Final Disposition 05/08/2022 8:42:07 AM See PCP within 24 Hours Yes Leonie Douglas, RN, Ashlynn PLEASE NOTE: All timestamps contained within this report are represented as [place] Standard Time. CONFIDENTIALTY NOTICE: This fax transmission is intended only for the addressee. It contains information that is legally privileged, confidential or otherwise protected from use or disclosure. If you are not the intended recipient, you are strictly prohibited from reviewing, disclosing, copying using or disseminating any of this information or taking any action in reliance on or regarding this information. If you have received this fax in error, please notify us immediately by telephone so that we can arrange for its return to [place]. Phone: [phone], Toll-Free: [phone], Fax: [phone] Page: 2 of 2 Call Id: [id number] Caller Disagree/Comply Comply Caller Understands Yes PreDisposition Call Doctor Care Advice Given Per Guideline SEE PCP WITHIN 24 HOURS: * IF OFFICE WILL BE OPEN: You need to be examined within the next 24 hours. Call your doctor (or NP/PA) when the office opens and make an appointment. DRINK EXTRA FLUIDS: * Drink extra fluids. * Drink 8 to 10 cups (1,800 to 2,400 ml) of liquids a day. PAIN MEDICINES: * For pain relief, you can take either acetaminophen, ibuprofen, or naproxen. SAMPLE: * Bring in a sample of the bloody urine. * Keep it in the refrigerator until you leave. CALL BACK IF: * Fever occurs * You become worse CARE ADVICE given per Urine, Blood In (Adult) guideline. Referrals REFERRED TO PCP OFFIC

## 2022-05-08 NOTE — Assessment & Plan Note (Signed)
First ? Uti  Pink urine/ dysuria and bladder pain  Ua pos for wbc, blood Cx pending Tx with keflex 500 mg tid 7 d Inst to call if sympt worsen while cx is pending  Er precautions noted  Handout given Enc good water intake

## 2022-05-08 NOTE — Therapy (Signed)
OUTPATIENT SPEECH LANGUAGE PATHOLOGY VOICE TREATMENT   Patient Name: Amber Miles MRN: [id number] DOB:1945/12/20, 77 y.o., female Today's Date: 05/03/2022  PCP: Roxy Manns, MD REFERRING PROVIDER: Bud Face, MD   End of Session - 05/03/22 1634     Visit Number 7    Number of Visits 17    Date for SLP Re-Evaluation 07/09/22    SLP Start Time 1400    SLP Stop Time  1500    SLP Time Calculation (min) 60 min    Activity Tolerance Patient tolerated treatment well             Past Medical History:  Diagnosis Date   Allergy    allergic rhinitis   Anxiety    Arthritis    OA knee replacement   Cataract    Degenerative disc disease, thoracic    Family history of breast cancer    Family history of genetic disease carrier    paternal cousin has NBN mutation   GERD (gastroesophageal reflux disease)    Hyperlipidemia    Hypertension    Hypothyroid    Reactive airways dysfunction syndrome (HCC)    Skin lesions, generalized    squamous cell skin lesions and basal cell skin lesions   Past Surgical History:  Procedure Laterality Date   ABDOMINAL HYSTERECTOMY  1990s   hyst with oophrectomy for B9lesion/cyst and prolapse   BREAST BIOPSY Bilateral    benign   JOINT REPLACEMENT  05/2009   left total knee replacement   recocele     and cystocele repair   TONSILLECTOMY     tsa     Patient Active Problem List   Diagnosis Date Noted   Upper respiratory infection, acute 10/03/2021   Oral candida 10/03/2021   Suspected COVID-19 virus infection 10/03/2021   Routine general medical examination at a health care facility 07/29/2019   Genetic testing 01/02/2018   Family history of breast cancer    Family history of genetic disease carrier    Meningioma 07/17/2017   Fall 01/04/2017   Vasomotor symptoms due to menopause 06/26/2016   Atrophic vaginitis 09/01/2015   Pedal edema 05/27/2014   Estrogen deficiency 02/21/2013   Encounter for Medicare annual wellness exam  02/21/2013   Low back pain potentially associated with radiculopathy 10/11/2012   ALLERGIC RHINITIS 01/28/2010   Prediabetes 07/30/2009   ANXIETY 11/30/2008   Hyperlipidemia 04/13/2008   Hypothyroidism 01/07/2008   HYPERTENSION, BENIGN 01/07/2008   OSTEOARTHRITIS, GENERALIZED 01/07/2008    Onset date: January 2023  REFERRING DIAG: Muscle tension dysphonia  THERAPY DIAG:  Dysphonia  Other voice and resonance disorders  Rationale for Evaluation and Treatment Rehabilitation  SUBJECTIVE:   SUBJECTIVE STATEMENT:  Pt pleasant and cooperative with unfamiliar therapist  Pt accompanied by: self  PERTINENT HISTORY: Patient is a 77 y.o. 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 No   PATIENT GOALS: Sing in church again  OBJECTIVE:    TODAY'S TREATMENT:  Pt sipped water intermittently throughout the session. Cough noted x4 today. Pt able to verbalize HEP and demonstrate properly. SLP encouraged pt to feel stretch in soft palate and pharynx during yawn exercise. SLP reviewed diaphragmatic vs clavicular breathing, and encouraged using visual  cue at home for feedback. Pt verbalized cough suppression strategies including using Luden's cough drops and drinking water. Pt continues to report discomfort with left side laryngeal massage. Pt was observed reading some of the [facility] article with good breaking for breaths.  PATIENT EDUCATION: Education details: vocal hygiene and abdominal breathing, yawning, easy onsets, laryngeal massage to the extent that it does not cause pain. Person educated: Patient Education method:  Explanation Education comprehension: verbalized understanding   HOME EXERCISE PROGRAM: Vocal hygiene, abdominal breathing Voiceless phonation of phrases and sentences    GOALS: Goals reviewed with patient? Yes  SHORT TERM GOALS: Target date: 10 sessions  Patient will demonstrate laryngeal relaxation strategies with independence.  Baseline: Goal status: INITIAL  2.   Patient will implement vocal techniques (resonant voice, abdominal breathing, flow phonation) to reduce hyperfunction for [facility] pitch and vocal quality at sentence level 90% acc. Baseline:  Goal status: INITIAL  3.  Patient will complete home exercise program for voice with modified independence. Baseline:  Goal status: INITIAL    LONG TERM GOALS: Target date: 07/09/2022  Patient will implement vocal techniques (resonant voice, abdominal breathing, flow phonation) to reduce hyperfunction for [facility] pitch and vocal quality at conversation level 80% acc.  Baseline:  Goal status: INITIAL  2.  Patient will improve vocal quality and pitch to within normal range as measured by perceptual scales and acoustic/aerodynamic voice measures.  Baseline:  Goal status: INITIAL  3.  Patient will report improved voice quality of life as measured by Voice-Related Quality of Life scale. 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 She reports onset of voice problem following brochitis and flu in Jan 2023. Vocal quality was more breathy today than hoarse/gravelly, and she was noted to speak less frequently on residual capacity. Frequent/chronic cough is also noted, and continues to be exacerbated due to allergies/pollen. Continued skilled ST intervention focused on vocal hygiene, vocal retraining, and abdominal breathing to improve vocal quality for social and community activities is recommended.  OBJECTIVE  IMPAIRMENTS include voice disorder. These impairments are limiting patient from effectively communicating at home and in community. Factors affecting potential to achieve goals and functional outcome are  time post onset (>1 year) .Marland Kitchen Patient will benefit from skilled SLP services to address above impairments and improve overall function.  REHAB POTENTIAL: Good  PLAN: SLP FREQUENCY: 2x/week  SLP DURATION: 8 weeks  PLANNED INTERVENTIONS: Cueing hierachy, SLP instruction and feedback, Patient/family education, Re-evaluation, and laryngeal relaxation, vocal hygiene, vocal function exercises, abdominal breathing  [place] Dann B. Murvin Natal, [facility], Sports administrator [phone]  [facility]  [facility] Services Office [phone] Fax [phone]

## 2022-05-08 NOTE — Assessment & Plan Note (Signed)
Ongoing since illness a y ago  Saw ENT Working with speech path and making progress

## 2022-05-08 NOTE — Patient Instructions (Addendum)
Take the generic keflex for uti  If symptoms suddenly worsen or any problems occur let us know   We will reach out with culture result when it returns in 2-3 days   Keep drinking water    Update if not starting to improve in a week or if worsening

## 2022-05-08 NOTE — Telephone Encounter (Signed)
Per note below access nurse note pt already has appt with Dr Milinda Antis 05/08/22 at 12:30. Sending note to Dr Milinda Antis.

## 2022-05-10 ENCOUNTER — Ambulatory Visit: Payer: PPO | Admitting: Speech Pathology

## 2022-05-10 ENCOUNTER — Telehealth: Payer: Self-pay | Admitting: *Deleted

## 2022-05-10 DIAGNOSIS — R498 Other voice and resonance disorders: Secondary | ICD-10-CM

## 2022-05-10 DIAGNOSIS — R490 Dysphonia: Secondary | ICD-10-CM | POA: Diagnosis not present

## 2022-05-10 LAB — URINE CULTURE
MICRO NUMBER:: 14856080
SPECIMEN QUALITY:: ADEQUATE

## 2022-05-10 NOTE — Telephone Encounter (Signed)
Left VM requesting pt to call the office back 

## 2022-05-10 NOTE — Telephone Encounter (Signed)
-----   Message from Judy Pimple, MD sent at 05/10/2022  3:14 PM EDT ----- Keflex should treat this uti well  If symptoms are not improving please let us know

## 2022-05-11 DIAGNOSIS — J4530 Mild persistent asthma, uncomplicated: Secondary | ICD-10-CM | POA: Diagnosis not present

## 2022-05-11 DIAGNOSIS — H1045 Other chronic allergic conjunctivitis: Secondary | ICD-10-CM | POA: Diagnosis not present

## 2022-05-11 DIAGNOSIS — J300 Vasomotor rhinitis: Secondary | ICD-10-CM | POA: Diagnosis not present

## 2022-05-11 NOTE — Therapy (Signed)
OUTPATIENT SPEECH LANGUAGE PATHOLOGY VOICE TREATMENT   Patient Name: Amber Miles MRN: [id number] DOB:December 04, 1945, 77 y.o., female Today's Date: 05/11/2022  PCP: Roxy Manns, MD REFERRING PROVIDER: Bud Face, MD   End of Session - 05/11/22 0903     Visit Number 9    Number of Visits 17    Date for SLP Re-Evaluation 07/09/22    SLP Start Time 1500    SLP Stop Time  1600    SLP Time Calculation (min) 60 min    Activity Tolerance Patient tolerated treatment well             Past Medical History:  Diagnosis Date   Allergy    allergic rhinitis   Anxiety    Arthritis    OA knee replacement   Cataract    Degenerative disc disease, thoracic    Family history of breast cancer    Family history of genetic disease carrier    paternal cousin has NBN mutation   GERD (gastroesophageal reflux disease)    Hyperlipidemia    Hypertension    Hypothyroid    Reactive airways dysfunction syndrome    Skin lesions, generalized    squamous cell skin lesions and basal cell skin lesions   Past Surgical History:  Procedure Laterality Date   ABDOMINAL HYSTERECTOMY  1990s   hyst with oophrectomy for B9lesion/cyst and prolapse   BREAST BIOPSY Bilateral    benign   JOINT REPLACEMENT  05/2009   left total knee replacement   recocele     and cystocele repair   TONSILLECTOMY     tsa     Patient Active Problem List   Diagnosis Date Noted   UTI (urinary tract infection) 05/08/2022   Hoarseness of voice 05/08/2022   Upper respiratory infection, acute 10/03/2021   Oral candida 10/03/2021   Suspected COVID-19 virus infection 10/03/2021   Routine general medical examination at a health care facility 07/29/2019   Genetic testing 01/02/2018   Family history of breast cancer    Family history of genetic disease carrier    Meningioma 07/17/2017   Fall 01/04/2017   Vasomotor symptoms due to menopause 06/26/2016   Atrophic vaginitis 09/01/2015   Pedal edema 05/27/2014   Estrogen  deficiency 02/21/2013   Encounter for Medicare annual wellness exam 02/21/2013   Low back pain potentially associated with radiculopathy 10/11/2012   ALLERGIC RHINITIS 01/28/2010   Prediabetes 07/30/2009   ANXIETY 11/30/2008   Hyperlipidemia 04/13/2008   Hypothyroidism 01/07/2008   HYPERTENSION, BENIGN 01/07/2008   OSTEOARTHRITIS, GENERALIZED 01/07/2008    Onset date: January 2023  REFERRING DIAG: Muscle tension dysphonia  THERAPY DIAG:  Other voice and resonance disorders  Dysphonia  Rationale for Evaluation and Treatment Rehabilitation  SUBJECTIVE:   SUBJECTIVE STATEMENT:  Pt reports she read a book to her grandchildren  Pt accompanied by: self  PERTINENT HISTORY: Patient is a 77 y.o. 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 No   PATIENT GOALS: Sing in church again  OBJECTIVE:    TODAY'S TREATMENT:  Pt reports some improvement in vocal quality intermittently but notes triggers of cough/strain, including exposure to outdoors, smoke, or strong scents. Continues to have tightening/aphonia when attempting onset of  phonation, which relaxes for brief periods. Question paradoxical vocal fold movement/ VCD given triggers, chronic cough, and intermittent symptoms. Session focused on identifying triggers and generating strategies to avoid these/ use of resets such as swallowing, rescue breathing, laryngeal massage. Did not attempt laryngeal massage this date as pt reported pain with this (L side) previous session.   PATIENT EDUCATION: Education details: vocal hygiene and abdominal breathing, yawning, easy onsets, laryngeal  massage to the extent that it does not cause pain. Person educated: Patient Education method: Explanation Education comprehension: verbalized understanding   HOME EXERCISE PROGRAM: Vocal hygiene, abdominal breathing Voiceless phonation of phrases and sentences    GOALS: Goals reviewed with patient? Yes  SHORT TERM GOALS: Target date: 10 sessions  Patient will demonstrate laryngeal relaxation strategies with independence.  Baseline: Goal status: INITIAL  2.   Patient will implement vocal techniques (resonant voice, abdominal breathing, flow phonation) to reduce hyperfunction for [facility] pitch and vocal quality at sentence level 90% acc. Baseline:  Goal status: INITIAL  3.  Patient will complete home exercise program for voice with modified independence. Baseline:  Goal status: INITIAL    LONG TERM GOALS: Target date: 07/09/2022  Patient will implement vocal techniques (resonant voice, abdominal breathing, flow phonation) to reduce hyperfunction for [facility] pitch and vocal quality at conversation level 80% acc.  Baseline:  Goal status: INITIAL  2.  Patient will improve vocal quality and pitch to within normal range as measured by perceptual scales and acoustic/aerodynamic voice measures.  Baseline:  Goal status: INITIAL  3.  Patient will report improved voice quality of life as measured by Voice-Related Quality of Life scale. 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 She reports onset of voice problem following brochitis and flu in Jan 2023. Vocal quality was more breathy today than hoarse/gravelly, and she was noted to speak less frequently on residual capacity. Frequent/chronic cough is also noted, and continues to be exacerbated due to allergies/pollen. Additional environmental triggers are also noted, as well as intermittent shortness of breath,  audible inspiration at times; question paradoxical vocal fold movement/possible VCD. Continued skilled ST intervention focused on vocal hygiene, vocal retraining, and abdominal breathing to improve vocal quality for social and community activities is recommended.  OBJECTIVE IMPAIRMENTS include voice disorder. These impairments are limiting patient from effectively communicating at home and in community. Factors affecting potential to achieve goals and functional outcome are  time post onset (>1 year) .Marland Kitchen Patient will benefit from skilled SLP services to address above impairments and improve overall function.  REHAB POTENTIAL: Good  PLAN: SLP FREQUENCY: 2x/week  SLP DURATION: 8 weeks  PLANNED INTERVENTIONS: Cueing hierachy, SLP instruction and feedback, Patient/family education, Re-evaluation, and laryngeal relaxation, vocal hygiene, vocal function exercises, abdominal breathing  Rondel Baton, [place], Sports administrator [phone]   [facility]  [facility] Rehabilitation Services Office [phone] Fax [phone]

## 2022-05-11 NOTE — Telephone Encounter (Signed)
Patient returned call,and I relayed message to her regarding keflex. She said that so far it seem to be working for her.

## 2022-05-16 ENCOUNTER — Ambulatory Visit: Payer: PPO | Admitting: Speech Pathology

## 2022-05-16 DIAGNOSIS — R498 Other voice and resonance disorders: Secondary | ICD-10-CM

## 2022-05-16 DIAGNOSIS — R490 Dysphonia: Secondary | ICD-10-CM

## 2022-05-16 NOTE — Therapy (Signed)
OUTPATIENT SPEECH LANGUAGE PATHOLOGY VOICE TREATMENT AND PROGRESS NOTE  Speech Therapy Progress Note  Dates of Reporting Period: 04/10/2022 to 05/16/2022   Objective: Patient has been seen for 10 speech therapy sessions this reporting period targeting dysphonia. Patient is making slow, steady progress toward LTGs and met 2/3 STGs this reporting period. See skilled intervention, clinical impressions, and goals below for details.    Patient Name: Amber Miles MRN: [id number] DOB:03-13-1945, 77 y.o., female Today's Date: 05/16/2022  PCP: Roxy Manns, MD REFERRING PROVIDER: Bud Face, MD   End of Session - 05/16/22 1506     Visit Number 10    Number of Visits 17    Date for SLP Re-Evaluation 07/09/22    SLP Start Time 1500    SLP Stop Time  1600    SLP Time Calculation (min) 60 min    Activity Tolerance Patient tolerated treatment well             Past Medical History:  Diagnosis Date   Allergy    allergic rhinitis   Anxiety    Arthritis    OA knee replacement   Cataract    Degenerative disc disease, thoracic    Family history of breast cancer    Family history of genetic disease carrier    paternal cousin has NBN mutation   GERD (gastroesophageal reflux disease)    Hyperlipidemia    Hypertension    Hypothyroid    Reactive airways dysfunction syndrome (HCC)    Skin lesions, generalized    squamous cell skin lesions and basal cell skin lesions   Past Surgical History:  Procedure Laterality Date   ABDOMINAL HYSTERECTOMY  1990s   hyst with oophrectomy for B9lesion/cyst and prolapse   BREAST BIOPSY Bilateral    benign   JOINT REPLACEMENT  05/2009   left total knee replacement   recocele     and cystocele repair   TONSILLECTOMY     tsa     Patient Active Problem List   Diagnosis Date Noted   UTI (urinary tract infection) 05/08/2022   Hoarseness of voice 05/08/2022   Upper respiratory infection, acute 10/03/2021   Oral candida 10/03/2021    Suspected COVID-19 virus infection 10/03/2021   Routine general medical examination at a health care facility 07/29/2019   Genetic testing 01/02/2018   Family history of breast cancer    Family history of genetic disease carrier    Meningioma (HCC) 07/17/2017   Fall 01/04/2017   Vasomotor symptoms due to menopause 06/26/2016   Atrophic vaginitis 09/01/2015   Pedal edema 05/27/2014   Estrogen deficiency 02/21/2013   Encounter for Medicare annual wellness exam 02/21/2013   Low back pain potentially associated with radiculopathy 10/11/2012   ALLERGIC RHINITIS 01/28/2010   Prediabetes 07/30/2009   ANXIETY 11/30/2008   Hyperlipidemia 04/13/2008   Hypothyroidism 01/07/2008   HYPERTENSION, BENIGN 01/07/2008   OSTEOARTHRITIS, GENERALIZED 01/07/2008    Onset date: January 2023  REFERRING DIAG: Muscle tension dysphonia  THERAPY DIAG:  Other voice and resonance disorders  Dysphonia  Rationale for Evaluation and Treatment Rehabilitation  SUBJECTIVE:   SUBJECTIVE STATEMENT:  "Nobody could hear me."  Pt accompanied by: self  PERTINENT HISTORY: Patient is a 77 y.o. 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 No   PATIENT GOALS: Sing in church again  OBJECTIVE:    TODAY'S TREATMENT:  Pt reports avoiding speaking over noise when at large gatherings. Has spent a lot of time outside for a family event and feels this impacted her voice. SLP performed larygneal pulldown and massage with pt reporting no pain. Completed neck stretches and yawn stretch with mod I. Progressed to airflow tasks, most effective was voiced /v/, which pt able  to complete with flow phonation 90% accuracy. Able to open into vowels for short periods, 85% acc. Unable to carry this over to hum or /m/. Pt demonstrated good awareness of tight/strained quality and initiated reset with min cues (swallow, sip water, pursed lip breathing). Intermittent cough occurred throughout session.   PATIENT EDUCATION: Education details: voiced /v/ added to home routine Person educated: Patient Education method: Explanation Education comprehension: verbalized understanding   HOME EXERCISE PROGRAM: Vocal hygiene, abdominal breathing Voiceless phonation of phrases and sentences    GOALS: Goals reviewed with patient? Yes  SHORT TERM GOALS: Target date: 10 sessions  Patient will demonstrate laryngeal relaxation strategies with independence.  Baseline: Goal status: MET  2.   Patient will implement vocal techniques (resonant voice, abdominal breathing, flow phonation) to reduce hyperfunction for [facility] pitch and vocal quality at sentence level 90% acc. Baseline: voiced /v/ 90%, voiced /v/ +vowel 85% Goal status: NOT MET; will continue  3.  Patient will complete home exercise program for voice with modified independence. Baseline:  Goal status: MET    LONG TERM GOALS: Target date: 07/09/2022  Patient will implement vocal techniques (resonant voice, abdominal breathing, flow phonation) to reduce hyperfunction for [facility] pitch and vocal quality at conversation level 80% acc.  Baseline:  Goal status: IN PROGRESS  2.  Patient will improve vocal quality and pitch to within normal range as measured by perceptual scales and acoustic/aerodynamic voice measures.  Baseline:  Goal status: IN PROGRESS  3.  Patient will report improved voice quality of life as measured by Voice-Related Quality of Life scale. 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 She reports onset of voice problem following brochitis and flu in Jan 2023. Vocal quality was more breathy today than hoarse/gravelly, and she was noted to speak less frequently on residual capacity. Frequent/chronic cough is also noted, and continues to be exacerbated due to allergies/pollen. 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 Continued skilled ST intervention focused on vocal hygiene, vocal retraining, and abdominal breathing to improve vocal quality for social and community activities is recommended.  OBJECTIVE IMPAIRMENTS include voice disorder. These impairments are limiting patient from effectively communicating at home and in community. Factors affecting potential to achieve goals and functional outcome are  time post onset (>1 year) .Marland Kitchen Patient will benefit from skilled SLP services to address above impairments and improve overall function.  REHAB POTENTIAL: Good  PLAN: SLP FREQUENCY: 2x/week  SLP DURATION: 8 weeks  PLANNED INTERVENTIONS: Cueing hierachy, SLP instruction and feedback, Patient/family education, Re-evaluation, and laryngeal relaxation, vocal hygiene, vocal function exercises, abdominal breathing  Rondel Baton, [place], Sports administrator [phone]   [facility]  [facility] Rehabilitation Services Office [phone] Fax [phone]

## 2022-05-16 NOTE — Patient Instructions (Signed)
After your stretches and breathing, you can try   "Vvvvvvvvvv" - only hold it out if you can do so smoothly without your airflow cutting off. Repeat 10x (as long as it's working, stop if not)  Then, you can try (each x5) "vvvvvvvah" "Vvvvvvee" "Vvvvvway" "Vvvvvvii" "Vvvvvvvoo"  STOP IF YOUR BREATH OR SOUND IS CUTTING OUT. You can try taking another breath and starting again, but if you can't get a smooth quality, stop and try it again later.

## 2022-05-22 ENCOUNTER — Ambulatory Visit (INDEPENDENT_AMBULATORY_CARE_PROVIDER_SITE_OTHER): Payer: PPO | Admitting: Family Medicine

## 2022-05-22 ENCOUNTER — Encounter: Payer: Self-pay | Admitting: Family Medicine

## 2022-05-22 VITALS — BP 138/68 | HR 84 | Temp 97.7°F | Ht 67.0 in | Wt 241.2 lb

## 2022-05-22 DIAGNOSIS — I10 Essential (primary) hypertension: Secondary | ICD-10-CM | POA: Diagnosis not present

## 2022-05-22 DIAGNOSIS — N3001 Acute cystitis with hematuria: Secondary | ICD-10-CM

## 2022-05-22 LAB — POC URINALSYSI DIPSTICK (AUTOMATED)
Bilirubin, UA: NEGATIVE
Glucose, UA: NEGATIVE
Ketones, UA: NEGATIVE
Nitrite, UA: NEGATIVE
Protein, UA: POSITIVE — AB
Spec Grav, UA: 1.01 (ref 1.010–1.025)
Urobilinogen, UA: 0.2 E.U./dL
pH, UA: 6 (ref 5.0–8.0)

## 2022-05-22 MED ORDER — SULFAMETHOXAZOLE-TRIMETHOPRIM 800-160 MG PO TABS: 1.0000 | ORAL_TABLET | Freq: Two times a day (BID) | ORAL | 0 refills | Status: DC

## 2022-05-22 NOTE — Assessment & Plan Note (Signed)
bp in fair control at this time  BP Readings from Last 1 Encounters:  05/22/22 138/68    Most recent labs reviewed  Disc lifstyle change with low sodium diet and exercise  Plan to continue  Verapamil SR 240 mg daily  Lisinopril 5 mg daily  Triam hct 37.5-25 mg  2 daily- will cut this to one daily in light of urinary and renal changes  F/u 3-6 wk

## 2022-05-22 NOTE — Progress Notes (Signed)
Subjective:    Patient ID: Amber Miles, female    DOB: 09-14-1945, 77 y.o.   MRN: [id number]  HPI Pt presents for urinary symptoms   Wt Readings from Last 3 Encounters:  05/22/22 241 lb 3.2 oz (109.4 kg)  05/08/22 241 lb (109.3 kg)  10/05/21 231 lb 6 oz (105 kg)   37.78 kg/m    Was treated for uti with keflex on 4/22 Culture noted e coli- pan sensitive  Got totally better    Symptoms started Saturday  Frequency  Visible blood yesterday  Burns to urinate  No fever or nausea  No back pain   Results for orders placed or performed in visit on 05/22/22  POCT Urinalysis Dipstick (Automated)  Result Value Ref Range   Color, UA yellow    Clarity, UA slightly cloudy    Glucose, UA Negative Negative   Bilirubin, UA negative    Ketones, UA negative    Spec Grav, UA 1.010 1.010 - 1.025   Blood, UA 2+    pH, UA 6.0 5.0 - 8.0   Protein, UA Positive (A) Negative   Urobilinogen, UA 0.2 0.2 or 1.0 E.U./dL   Nitrite, UA negative    Leukocytes, UA Large (3+) (A) Negative         Lab Results  Component Value Date   CREATININE 1.17 10/03/2021   BUN 24 (H) 10/03/2021   NA 139 10/03/2021   K 4.2 10/03/2021   CL 105 10/03/2021   CO2 26 10/03/2021   GFR 45.2 in the fall  Takes triamerine hct  HTN bp is stable today  No cp or palpitations or headaches or edema  No side effects to medicines  BP Readings from Last 3 Encounters:  05/22/22 138/68  05/08/22 132/60  10/05/21 139/80    Verapamil XR 240 mg daily  Lisinopril 5 mg daily  Triam hct 37.5-25 mg 2 pills daily   Patient Active Problem List   Diagnosis Date Noted   UTI (urinary tract infection) 05/08/2022   Hoarseness of voice 05/08/2022   Upper respiratory infection, acute 10/03/2021   Oral candida 10/03/2021   Suspected COVID-19 virus infection 10/03/2021   Routine general medical examination at a health care facility 07/29/2019   Genetic testing 01/02/2018   Family history of breast cancer    Family  history of genetic disease carrier    Meningioma (HCC) 07/17/2017   Fall 01/04/2017   Vasomotor symptoms due to menopause 06/26/2016   Atrophic vaginitis 09/01/2015   Pedal edema 05/27/2014   Estrogen deficiency 02/21/2013   Encounter for Medicare annual wellness exam 02/21/2013   Low back pain potentially associated with radiculopathy 10/11/2012   ALLERGIC RHINITIS 01/28/2010   Prediabetes 07/30/2009   ANXIETY 11/30/2008   Hyperlipidemia 04/13/2008   Hypothyroidism 01/07/2008   HYPERTENSION, BENIGN 01/07/2008   OSTEOARTHRITIS, GENERALIZED 01/07/2008   Past Medical History:  Diagnosis Date   Allergy    allergic rhinitis   Anxiety    Arthritis    OA knee replacement   Cataract    Degenerative disc disease, thoracic    Family history of breast cancer    Family history of genetic disease carrier    paternal cousin has NBN mutation   GERD (gastroesophageal reflux disease)    Hyperlipidemia    Hypertension    Hypothyroid    Reactive airways dysfunction syndrome (HCC)    Skin lesions, generalized    squamous cell skin lesions and basal cell skin lesions   Past  Surgical History:  Procedure Laterality Date   ABDOMINAL HYSTERECTOMY  1990s   hyst with oophrectomy for B9lesion/cyst and prolapse   BREAST BIOPSY Bilateral    benign   JOINT REPLACEMENT  05/2009   left total knee replacement   recocele     and cystocele repair   TONSILLECTOMY     tsa     Social History   Tobacco Use   Smoking status: Never   Smokeless tobacco: Never  Vaping Use   Vaping Use: Never used  Substance Use Topics   Alcohol use: No    Alcohol/week: 0.0 standard drinks of alcohol   Drug use: No   Family History  Problem Relation Age of Onset   Hypertension Mother    Stroke Mother    Depression Mother    Alzheimer's disease Mother    Heart disease Father    Cerebral palsy Father    Hypertension Sister    Breast cancer Cousin 68       NBN mutation   Melanoma Maternal Uncle        dx  >50   Allergies  Allergen Reactions   Neosporin [Neomycin-Polymyxin-Gramicidin] Other (See Comments)    blisters   Tape Swelling    Itching and redness Pt said severe reaction to surgical tape.? latex   Current Outpatient Medications on File Prior to Visit  Medication Sig Dispense Refill   Acetaminophen-guaiFENesin (MUCINEX COLD & FLU PO) Take by mouth.     acyclovir (ZOVIRAX) 800 MG tablet TAKE 1 TABLET BY MOUTH 3 TIMES DAILY FOR 3 DAYS FLARES  2   albuterol (VENTOLIN HFA) 108 (90 Base) MCG/ACT inhaler INHALE 1 TO 2 PUFFS EVERY 4 TO 6 HOURS AS NEEDED FOR WHEEZE OR COUGH 8.5 each 1   amoxicillin (AMOXIL) 500 MG capsule Take 500 mg by mouth as directed.     ARNUITY ELLIPTA 200 MCG/ACT AEPB Inhale 1 puff into the lungs daily.     azelastine (ASTELIN) 0.1 % nasal spray Place 1 spray into both nostrils 2 (two) times daily.     Dextromethorphan-guaiFENesin (DELSYM COUGH/CHEST CONGEST DM) 5-100 MG/5ML LIQD Take by mouth.     Docusate Calcium (STOOL SOFTENER PO) Take 1 tablet by mouth daily as needed.     esomeprazole (NEXIUM) 40 MG capsule TAKE 1 CAPSULE BY MOUTH EVERY DAY BEFORE BREAKFAST 90 capsule 1   estradiol (VIVELLE-DOT) 0.0375 MG/24HR PLACE 1 PATCH ONTO THE SKIN 2 TIMES A WEEK. 24 patch 1   GLUCOSAMINE-CHONDROITIN-VIT D3 PO Take 1 capsule by mouth 2 (two) times daily.     lisinopril (ZESTRIL) 5 MG tablet TAKE 1 TABLET BY MOUTH EVERY DAY 90 tablet 1   montelukast (SINGULAIR) 10 MG tablet Take 10 mg by mouth daily.      Multiple Vitamin (MULTIVITAMIN) tablet Take 1 tablet by mouth daily.       naproxen sodium (ALEVE) 220 MG tablet Take 220 mg by mouth every 12 (twelve) hours.     potassium chloride SA (KLOR-CON M20) 20 MEQ tablet TAKE 1 TABLET BY MOUTH EVERY DAY 90 tablet 3   SYNTHROID 112 MCG tablet Take 112 mcg by mouth daily.  11   tiZANidine (ZANAFLEX) 4 MG tablet TAKE 1 TABLET BY MOUTH EVERY 6 HOURS AS NEEDED 360 tablet 0   traMADol (ULTRAM) 50 MG tablet TAKE 1-2 TABLETS BY MOUTH  EVERY 8 HOURS AS NEEDED FOR SEVERE(SHOULDER PAIN). CAUTION OF SEDATION 30 tablet 0   triamterene-hydrochlorothiazide (MAXZIDE-25) 37.5-25 MG tablet TAKE 2 TABLETS BY  MOUTH EVERY DAY (Patient taking differently: Take 1 tablet by mouth daily.) 180 tablet 1   verapamil (CALAN-SR) 240 MG CR tablet TAKE 1 TABLET (240 MG TOTAL) BY MOUTH 2 (TWO) TIMES DAILY. 180 tablet 2   No current facility-administered medications on file prior to visit.    Review of Systems  Constitutional:  Positive for fatigue. Negative for activity change, appetite change and fever.  HENT:  Negative for congestion and sore throat.   Eyes:  Negative for itching and visual disturbance.  Respiratory:  Negative for cough and shortness of breath.   Cardiovascular:  Negative for leg swelling.       Some ankle swelling at the end of the day  Gastrointestinal:  Negative for abdominal distention, abdominal pain, constipation, diarrhea and nausea.  Endocrine: Negative for cold intolerance and polydipsia.  Genitourinary:  Positive for dysuria, frequency and urgency. Negative for difficulty urinating, flank pain and hematuria.  Musculoskeletal:  Negative for myalgias.  Skin:  Negative for rash.  Allergic/Immunologic: Negative for immunocompromised state.  Neurological:  Negative for dizziness and weakness.  Hematological:  Negative for adenopathy.       Objective:   Physical Exam Constitutional:      General: She is not in acute distress.    Appearance: Normal appearance. She is well-developed. She is obese. She is not ill-appearing or diaphoretic.  HENT:     Head: Normocephalic and atraumatic.  Eyes:     Conjunctiva/sclera: Conjunctivae normal.     Pupils: Pupils are equal, round, and reactive to light.  Neck:     Thyroid: No thyromegaly.     Vascular: No carotid bruit or JVD.  Cardiovascular:     Rate and Rhythm: Normal rate and regular rhythm.     Heart sounds: Normal heart sounds.     No gallop.  Pulmonary:      Effort: Pulmonary effort is normal. No respiratory distress.     Breath sounds: Normal breath sounds. No wheezing or rales.  Abdominal:     General: There is no distension or abdominal bruit.     Palpations: Abdomen is soft. There is no mass.     Tenderness: There is no abdominal tenderness. There is no right CVA tenderness or left CVA tenderness.     Comments: No suprapubic tenderness or fullness     Musculoskeletal:     Cervical back: Normal range of motion and neck supple.     Right lower leg: Edema present.     Left lower leg: Edema present.     Comments: Trace ankle edema bilat   Lymphadenopathy:     Cervical: No cervical adenopathy.  Skin:    General: Skin is warm and dry.     Coloration: Skin is not pale.     Findings: No rash.  Neurological:     Mental Status: She is alert.     Coordination: Coordination normal.     Deep Tendon Reflexes: Reflexes are normal and symmetric. Reflexes normal.  Psychiatric:        Mood and Affect: Mood normal.           Assessment & Plan:   Problem List Items Addressed This Visit       Cardiovascular and Mediastinum   HYPERTENSION, BENIGN    bp in fair control at this time  BP Readings from Last 1 Encounters:  05/22/22 138/68   Most recent labs reviewed  Disc lifstyle change with low sodium diet and exercise  Plan to continue  Verapamil SR 240 mg daily  Lisinopril 5 mg daily  Triam hct 37.5-25 mg  2 daily- will cut this to one daily in light of urinary and renal changes  F/u 3-6 wk         Genitourinary   UTI (urinary tract infection) - Primary    Last uti did clinically resolve with keflex ( e coli pan sens)  Rev last labs  Will try bactrim for this uti  Will also cut her triam/hct to half   Pending cx  F/u 3-6 wk to re check ua and bp  Instructed to call if symptoms worsen in the meantime  Handout given       Relevant Medications   sulfamethoxazole-trimethoprim (BACTRIM DS) 800-160 MG tablet   Other Relevant  Orders   POCT Urinalysis Dipstick (Automated) (Completed)   Urine Culture

## 2022-05-22 NOTE — Assessment & Plan Note (Signed)
Last uti did clinically resolve with keflex ( e coli pan sens)  Rev last labs  Will try bactrim for this uti  Will also cut her triam/hct to half   Pending cx  F/u 3-6 wk to re check ua and bp  Instructed to call if symptoms worsen in the meantime  Handout given

## 2022-05-22 NOTE — Patient Instructions (Addendum)
Cut your triamterine hct to one pill daily instead of 2   Keep drinking lots of water    Take bactrim ds for you uti  Twice daily for 7 days  If any side effects let us know  Take it with food  Stay out of the sun with this medicine   If your urinary symptoms suddenly get worse let us know    We will call you with a culture result   Follow up here in 3-6 weeks for re check of urine and blood pressure

## 2022-05-23 ENCOUNTER — Ambulatory Visit: Payer: PPO | Attending: Otolaryngology | Admitting: Speech Pathology

## 2022-05-23 DIAGNOSIS — R490 Dysphonia: Secondary | ICD-10-CM | POA: Diagnosis not present

## 2022-05-23 DIAGNOSIS — R498 Other voice and resonance disorders: Secondary | ICD-10-CM

## 2022-05-23 NOTE — Therapy (Unsigned)
OUTPATIENT SPEECH LANGUAGE PATHOLOGY VOICE TREATMENT NOTE    Patient Name: Amber Miles MRN: [id number] DOB:April 21, 1945, 77 y.o., female Today's Date: 05/23/2022  PCP: Roxy Manns, MD REFERRING PROVIDER: Bud Face, MD   End of Session - 05/23/22 1504     Visit Number 11    Number of Visits 17    Date for SLP Re-Evaluation 07/09/22    SLP Start Time 1500    SLP Stop Time  1600    SLP Time Calculation (min) 60 min    Activity Tolerance Patient tolerated treatment well             Past Medical History:  Diagnosis Date   Allergy    allergic rhinitis   Anxiety    Arthritis    OA knee replacement   Cataract    Degenerative disc disease, thoracic    Family history of breast cancer    Family history of genetic disease carrier    paternal cousin has NBN mutation   GERD (gastroesophageal reflux disease)    Hyperlipidemia    Hypertension    Hypothyroid    Reactive airways dysfunction syndrome (HCC)    Skin lesions, generalized    squamous cell skin lesions and basal cell skin lesions   Past Surgical History:  Procedure Laterality Date   ABDOMINAL HYSTERECTOMY  1990s   hyst with oophrectomy for B9lesion/cyst and prolapse   BREAST BIOPSY Bilateral    benign   JOINT REPLACEMENT  05/2009   left total knee replacement   recocele     and cystocele repair   TONSILLECTOMY     tsa     Patient Active Problem List   Diagnosis Date Noted   UTI (urinary tract infection) 05/08/2022   Hoarseness of voice 05/08/2022   Upper respiratory infection, acute 10/03/2021   Oral candida 10/03/2021   Suspected COVID-19 virus infection 10/03/2021   Routine general medical examination at a health care facility 07/29/2019   Genetic testing 01/02/2018   Family history of breast cancer    Family history of genetic disease carrier    Meningioma (HCC) 07/17/2017   Fall 01/04/2017   Vasomotor symptoms due to menopause 06/26/2016   Atrophic vaginitis 09/01/2015   Pedal edema  05/27/2014   Estrogen deficiency 02/21/2013   Encounter for Medicare annual wellness exam 02/21/2013   Low back pain potentially associated with radiculopathy 10/11/2012   ALLERGIC RHINITIS 01/28/2010   Prediabetes 07/30/2009   ANXIETY 11/30/2008   Hyperlipidemia 04/13/2008   Hypothyroidism 01/07/2008   HYPERTENSION, BENIGN 01/07/2008   OSTEOARTHRITIS, GENERALIZED 01/07/2008    Onset date: January 2023  REFERRING DIAG: Muscle tension dysphonia  THERAPY DIAG:  Other voice and resonance disorders  Dysphonia  Rationale for Evaluation and Treatment Rehabilitation  SUBJECTIVE:   SUBJECTIVE STATEMENT:  "Sometimes it's good and sometimes it's bad."  Pt accompanied by: self  PERTINENT HISTORY: Patient is a 77 y.o. 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 No   PATIENT GOALS: Sing in church again  OBJECTIVE:    TODAY'S TREATMENT:  Pt continues to report frequent cough, voice cutting out intermittently. Stretches, relaxation, semi-occluded vocal tract exercises, flow phonation, and resonant voice techniques attempted without improvement in function  or quality today. Focused session on breath support. Discussed with pt plan to place her on hold until follow up with Dr. Andee Poles in 2 weeks. Pt in agreement, to return May 20.   PATIENT EDUCATION: Education details: Higher education careers adviser, breath support, communication strategies (quiet environment, face-to-face, minimize background noise, etc) Person educated: Patient Education method: Explanation Education comprehension: verbalized understanding   HOME EXERCISE PROGRAM: Vocal hygiene,  abdominal breathing Voiceless phonation of phrases and sentences    GOALS: Goals reviewed with patient? Yes  SHORT TERM GOALS: Target date: 10 sessions  Patient will demonstrate laryngeal relaxation strategies with independence.  Baseline: Goal status: MET  2.   Patient will implement vocal techniques (resonant voice, abdominal breathing, flow phonation) to reduce hyperfunction for [facility] pitch and vocal quality at sentence level 90% acc. Baseline: voiced /v/ 90%, voiced /v/ +vowel 85% Goal status: NOT MET; will continue  3.  Patient will complete home exercise program for voice with modified independence. Baseline:  Goal status: MET    LONG TERM GOALS: Target date: 07/09/2022  Patient will implement vocal techniques (resonant voice, abdominal breathing, flow phonation) to reduce hyperfunction for [facility] pitch and vocal quality at conversation level 80% acc.  Baseline:  Goal status: IN PROGRESS  2.  Patient will improve vocal quality and pitch to within normal range as measured by perceptual scales and acoustic/aerodynamic voice measures.  Baseline:  Goal status: IN PROGRESS  3.  Patient will report improved voice quality of life as measured by Voice-Related Quality of Life scale. 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 She reports onset of voice problem following brochitis and flu in Jan 2023. Vocal quality remains intermittently hoarse, strained, and occasionally breathy, and techniques have been inconsistent in improving quality. Frequent/chronic cough continues, also exacerbated due to allergies/pollen. 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 Pt has follow up with Dr. Andee Poles in 2 weeks; will hold sessions  pending further ENT suggestions. Continued skilled ST intervention focused on vocal hygiene, vocal retraining, and abdominal breathing to improve vocal quality for social and community activities is recommended.  OBJECTIVE IMPAIRMENTS include voice disorder. These impairments are limiting patient from effectively communicating at home and in community. Factors affecting potential to achieve goals and functional outcome are  time post onset (>1 year) .Marland Kitchen Patient will benefit from skilled SLP services to address above impairments and improve overall function.  REHAB POTENTIAL: Good  PLAN: SLP FREQUENCY: 2x/week  SLP DURATION: 8 weeks  PLANNED INTERVENTIONS: Cueing hierachy, SLP instruction and feedback, Patient/family education, Re-evaluation, and laryngeal relaxation, vocal hygiene, vocal function exercises, abdominal breathing  Rondel Baton, [place], Sports administrator [phone]   [facility]  [facility] Office [phone] Fax [phone]

## 2022-05-24 LAB — URINE CULTURE
MICRO NUMBER:: 14917612
SPECIMEN QUALITY:: ADEQUATE

## 2022-05-29 ENCOUNTER — Ambulatory Visit: Payer: PPO | Admitting: Speech Pathology

## 2022-05-31 ENCOUNTER — Encounter: Payer: PPO | Admitting: Speech Pathology

## 2022-06-05 ENCOUNTER — Ambulatory Visit: Payer: PPO | Admitting: Speech Pathology

## 2022-06-06 DIAGNOSIS — J301 Allergic rhinitis due to pollen: Secondary | ICD-10-CM | POA: Diagnosis not present

## 2022-06-06 DIAGNOSIS — K219 Gastro-esophageal reflux disease without esophagitis: Secondary | ICD-10-CM | POA: Diagnosis not present

## 2022-06-06 DIAGNOSIS — J019 Acute sinusitis, unspecified: Secondary | ICD-10-CM | POA: Diagnosis not present

## 2022-06-06 DIAGNOSIS — E041 Nontoxic single thyroid nodule: Secondary | ICD-10-CM | POA: Diagnosis not present

## 2022-06-06 DIAGNOSIS — R490 Dysphonia: Secondary | ICD-10-CM | POA: Diagnosis not present

## 2022-06-08 ENCOUNTER — Ambulatory Visit: Payer: PPO | Admitting: Speech Pathology

## 2022-06-16 ENCOUNTER — Ambulatory Visit (INDEPENDENT_AMBULATORY_CARE_PROVIDER_SITE_OTHER): Payer: PPO

## 2022-06-16 ENCOUNTER — Encounter: Payer: Self-pay | Admitting: Podiatry

## 2022-06-16 ENCOUNTER — Ambulatory Visit: Payer: PPO | Admitting: Podiatry

## 2022-06-16 VITALS — BP 169/79 | HR 53

## 2022-06-16 DIAGNOSIS — M79675 Pain in left toe(s): Secondary | ICD-10-CM | POA: Diagnosis not present

## 2022-06-16 DIAGNOSIS — B351 Tinea unguium: Secondary | ICD-10-CM | POA: Diagnosis not present

## 2022-06-16 DIAGNOSIS — M2042 Other hammer toe(s) (acquired), left foot: Secondary | ICD-10-CM

## 2022-06-16 DIAGNOSIS — M2041 Other hammer toe(s) (acquired), right foot: Secondary | ICD-10-CM

## 2022-06-16 DIAGNOSIS — M79674 Pain in right toe(s): Secondary | ICD-10-CM

## 2022-06-16 NOTE — Progress Notes (Signed)
   Chief Complaint  Patient presents with   Nail Problem    "Trim my nails."   Hammer Toe    "Figure out what he's going to do about my Hammer Toes."    SUBJECTIVE Patient presents to office today complaining of elongated, thickened nails that cause pain while ambulating in shoes.  Patient is unable to trim their own nails.  Patient is also concerned due to her gait and balance.  She says that she feels very unsteady in her feet.  She is concerned that possibly this is coming from her hammertoe deformities.  Patient is here for further evaluation and treatment.  Past Medical History:  Diagnosis Date   Allergy    allergic rhinitis   Anxiety    Arthritis    OA knee replacement   Cataract    Degenerative disc disease, thoracic    Family history of breast cancer    Family history of genetic disease carrier    paternal cousin has NBN mutation   GERD (gastroesophageal reflux disease)    Hyperlipidemia    Hypertension    Hypothyroid    Reactive airways dysfunction syndrome (HCC)    Skin lesions, generalized    squamous cell skin lesions and basal cell skin lesions    Allergies  Allergen Reactions   Neosporin [Neomycin-Polymyxin-Gramicidin] Other (See Comments)    blisters   Tape Swelling    Itching and redness Pt said severe reaction to surgical tape.? latex     OBJECTIVE General Patient is awake, alert, and oriented x 3 and in no acute distress. Derm Skin is dry and supple bilateral. Negative open lesions or macerations. Remaining integument unremarkable. Nails are tender, long, thickened and dystrophic with subungual debris, consistent with onychomycosis, 1-5 bilateral. No signs of infection noted. Vasc  DP and PT pedal pulses palpable bilaterally. Temperature gradient within normal limits.  Neuro Epicritic and protective threshold sensation grossly intact bilaterally.  Musculoskeletal Exam No symptomatic pedal deformities noted bilateral. Muscular strength within normal  limits.  Mild reducible hammertoes noted lesser digits bilateral  ASSESSMENT 1.  Pain due to onychomycosis of toenails both 2.  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  Unsteadiness/instability of gait  PLAN OF CARE 1. Patient evaluated today.  2. Instructed to maintain good pedal hygiene and foot care.  3. Mechanical debridement of nails 1-5 bilaterally performed using a nail nipper. Filed with dremel without incident.  4.  Recommend conservative treatments for the hammertoe deformities.  She says they are not painful or symptomatic.  Recommend good supportive tennis shoes and sneakers.  Assured the patient that her instability of gait is not coming from her hammertoes 5.  Prescription for physical therapy at [facility] to help with balance and gait training  6.  Return to clinic as needed   Felecia Shelling, DPM [facility]  Dr. Felecia Shelling, DPM    [address] [place], [place] [address]                Office [phone]  Fax [phone]

## 2022-06-19 ENCOUNTER — Encounter: Payer: Self-pay | Admitting: Family Medicine

## 2022-06-19 ENCOUNTER — Ambulatory Visit (INDEPENDENT_AMBULATORY_CARE_PROVIDER_SITE_OTHER): Payer: PPO | Admitting: Family Medicine

## 2022-06-19 VITALS — BP 135/75 | HR 70 | Temp 98.7°F | Ht 67.0 in | Wt 242.0 lb

## 2022-06-19 DIAGNOSIS — R944 Abnormal results of kidney function studies: Secondary | ICD-10-CM | POA: Diagnosis not present

## 2022-06-19 DIAGNOSIS — R600 Localized edema: Secondary | ICD-10-CM

## 2022-06-19 DIAGNOSIS — N3001 Acute cystitis with hematuria: Secondary | ICD-10-CM | POA: Diagnosis not present

## 2022-06-19 DIAGNOSIS — I10 Essential (primary) hypertension: Secondary | ICD-10-CM | POA: Diagnosis not present

## 2022-06-19 LAB — POC URINALSYSI DIPSTICK (AUTOMATED)
Bilirubin, UA: NEGATIVE
Blood, UA: NEGATIVE
Glucose, UA: NEGATIVE
Ketones, UA: NEGATIVE
Nitrite, UA: NEGATIVE
Protein, UA: NEGATIVE
Spec Grav, UA: 1.01 (ref 1.010–1.025)
Urobilinogen, UA: 0.2 E.U./dL
pH, UA: 6.5 (ref 5.0–8.0)

## 2022-06-19 MED ORDER — FUROSEMIDE 20 MG PO TABS
10.0000 mg | ORAL_TABLET | Freq: Every day | ORAL | 0 refills | Status: DC
Start: 2022-06-19 — End: 2022-07-12

## 2022-06-19 MED ORDER — TRIAMTERENE-HCTZ 37.5-25 MG PO TABS: 1.0000 | ORAL_TABLET | Freq: Every day | ORAL | 0 refills | Status: DC

## 2022-06-19 NOTE — Assessment & Plan Note (Signed)
Worse on 1/2 dose of triam-hct Will add lasix 10 mg daily in am and check bmet in about a week  Elevate legs when able  Supp stockings (pt has some) Less processed/salty foods  More water intake Wt loss may help   Verapamil may worsen this as well

## 2022-06-19 NOTE — Assessment & Plan Note (Signed)
bp in fair control at this time  BP Readings from Last 1 Encounters:  06/19/22 135/75   No changes needed Most recent labs reviewed  Disc lifstyle change with low sodium diet and exercise  Plan to continue lower dose of triam hct- 37.5-25 mg one pill daily  Adding 10 mg lasix for pedal edema  Verapamil SR 240 mg daily  Lisinopril 5 mg daily   Will check bmet in a week for renal fxn and K Continues klor con 20 meq daily

## 2022-06-19 NOTE — Patient Instructions (Addendum)
Continue 1 pill of triamterine-hct  Add lasix 1/2 pill each morning for swelling   Use your support stockings also   Drink water Avoid excessive sodium (salty processed foods)   Try to get most of your carbohydrates from produce (with the exception of white potatoes)  Eat less bread/pasta/rice/snack foods/cereals/sweets and other items from the middle of the grocery store (processed carbs)  We will check labs in about a week for kidney function and potassium   Urine is clear BP is ok    Take care of yourself !

## 2022-06-19 NOTE — Assessment & Plan Note (Signed)
Symptoms are resolved  Ua clear with small leuks Encouraged to keep up a good water intake

## 2022-06-19 NOTE — Assessment & Plan Note (Signed)
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

## 2022-06-19 NOTE — Progress Notes (Signed)
Subjective:    Patient ID: Amber Miles, female    DOB: 08-Sep-1945, 77 y.o.   MRN: [id number]  HPI Pt presents for HTN and also uti re check   Unsteady walking - will start PT for that  Sees Dr Logan Bores     Wt Readings from Last 3 Encounters:  06/19/22 242 lb (109.8 kg)  05/22/22 241 lb 3.2 oz (109.4 kg)  05/08/22 241 lb (109.3 kg)   37.90 kg/m   Vitals:   06/19/22 1501 06/19/22 1517  BP: (!) 144/66 135/75  Pulse: 70   Temp: 98.7 F (37.1 C)   SpO2: 96%       HTN bp is stable today  No cp or palpitations or headaches or edema  No side effects to medicines  BP Readings from Last 3 Encounters:  06/19/22 135/75  06/16/22 (!) 169/79  05/22/22 138/68     Verapamil SR 240 mg daily  Lisinopril 5 mg daily  Tram hct 37.5-25 mg  = was on 2 pills daily, we cut to one daily due to urinary and renal changes at last visit   Swelling is worse   Had 2 utis since April Treated with keflex, then bactrim Pan sensitive e coli both times  Encouraged more water intake   Urinary symptoms are better   Lab Results  Component Value Date   CREATININE 1.17 10/03/2021   BUN 24 (H) 10/03/2021   NA 139 10/03/2021   K 4.2 10/03/2021   CL 105 10/03/2021   CO2 26 10/03/2021   GFR 45.25 Takes Klor con 20 meq daily   Results for orders placed or performed in visit on 06/19/22  POCT Urinalysis Dipstick (Automated)  Result Value Ref Range   Color, UA yellow    Clarity, UA clear    Glucose, UA Negative Negative   Bilirubin, UA neg    Ketones, UA neg    Spec Grav, UA 1.010 1.010 - 1.025   Blood, UA neg    pH, UA 6.5 5.0 - 8.0   Protein, UA Negative Negative   Urobilinogen, UA 0.2 0.2 or 1.0 E.U./dL   Nitrite, UA neg    Leukocytes, UA Small (1+) (A) Negative    Patient Active Problem List   Diagnosis Date Noted   Decreased GFR 06/19/2022   UTI (urinary tract infection) 05/08/2022   Hoarseness of voice 05/08/2022   Upper respiratory infection, acute 10/03/2021   Oral  candida 10/03/2021   Suspected COVID-19 virus infection 10/03/2021   Routine general medical examination at a health care facility 07/29/2019   Genetic testing 01/02/2018   Family history of breast cancer    Family history of genetic disease carrier    Meningioma (HCC) 07/17/2017   Fall 01/04/2017   Vasomotor symptoms due to menopause 06/26/2016   Atrophic vaginitis 09/01/2015   Pedal edema 05/27/2014   Estrogen deficiency 02/21/2013   Encounter for Medicare annual wellness exam 02/21/2013   Low back pain potentially associated with radiculopathy 10/11/2012   ALLERGIC RHINITIS 01/28/2010   Prediabetes 07/30/2009   ANXIETY 11/30/2008   Hyperlipidemia 04/13/2008   Hypothyroidism 01/07/2008   HYPERTENSION, BENIGN 01/07/2008   OSTEOARTHRITIS, GENERALIZED 01/07/2008   Past Medical History:  Diagnosis Date   Allergy    allergic rhinitis   Anxiety    Arthritis    OA knee replacement   Cataract    Degenerative disc disease, thoracic    Family history of breast cancer    Family history of genetic disease  carrier    paternal cousin has NBN mutation   GERD (gastroesophageal reflux disease)    Hyperlipidemia    Hypertension    Hypothyroid    Reactive airways dysfunction syndrome (HCC)    Skin lesions, generalized    squamous cell skin lesions and basal cell skin lesions   Past Surgical History:  Procedure Laterality Date   ABDOMINAL HYSTERECTOMY  1990s   hyst with oophrectomy for B9lesion/cyst and prolapse   BREAST BIOPSY Bilateral    benign   JOINT REPLACEMENT  05/2009   left total knee replacement   recocele     and cystocele repair   TONSILLECTOMY     tsa     Social History   Tobacco Use   Smoking status: Never   Smokeless tobacco: Never  Vaping Use   Vaping Use: Never used  Substance Use Topics   Alcohol use: No    Alcohol/week: 0.0 standard drinks of alcohol   Drug use: No   Family History  Problem Relation Age of Onset   Hypertension Mother    Stroke  Mother    Depression Mother    Alzheimer's disease Mother    Heart disease Father    Cerebral palsy Father    Hypertension Sister    Breast cancer Cousin 10       NBN mutation   Melanoma Maternal Uncle        dx >50   Allergies  Allergen Reactions   Neosporin [Neomycin-Polymyxin-Gramicidin] Other (See Comments)    blisters   Tape Swelling    Itching and redness Pt said severe reaction to surgical tape.? latex   Current Outpatient Medications on File Prior to Visit  Medication Sig Dispense Refill   acyclovir (ZOVIRAX) 800 MG tablet TAKE 1 TABLET BY MOUTH 3 TIMES DAILY FOR 3 DAYS FLARES  2   albuterol (VENTOLIN HFA) 108 (90 Base) MCG/ACT inhaler INHALE 1 TO 2 PUFFS EVERY 4 TO 6 HOURS AS NEEDED FOR WHEEZE OR COUGH 8.5 each 1   ARNUITY ELLIPTA 200 MCG/ACT AEPB Inhale 1 puff into the lungs daily.     azelastine (ASTELIN) 0.1 % nasal spray Place 1 spray into both nostrils 2 (two) times daily.     cetirizine (ZYRTEC) 5 MG tablet Take 5 mg by mouth daily.     Dextromethorphan-guaiFENesin (DELSYM COUGH/CHEST CONGEST DM) 5-100 MG/5ML LIQD Take by mouth.     Docusate Calcium (STOOL SOFTENER PO) Take 1 tablet by mouth daily as needed.     esomeprazole (NEXIUM) 40 MG capsule TAKE 1 CAPSULE BY MOUTH EVERY DAY BEFORE BREAKFAST 90 capsule 1   estradiol (VIVELLE-DOT) 0.0375 MG/24HR PLACE 1 PATCH ONTO THE SKIN 2 TIMES A WEEK. 24 patch 1   GLUCOSAMINE-CHONDROITIN-VIT D3 PO Take 1 capsule by mouth 2 (two) times daily.     lisinopril (ZESTRIL) 5 MG tablet TAKE 1 TABLET BY MOUTH EVERY DAY 90 tablet 1   montelukast (SINGULAIR) 10 MG tablet Take 10 mg by mouth daily.      Multiple Vitamin (MULTIVITAMIN) tablet Take 1 tablet by mouth daily.       naproxen sodium (ALEVE) 220 MG tablet Take 220 mg by mouth every 12 (twelve) hours.     potassium chloride SA (KLOR-CON M20) 20 MEQ tablet TAKE 1 TABLET BY MOUTH EVERY DAY 90 tablet 3   SYNTHROID 112 MCG tablet Take 112 mcg by mouth daily.  11   tiZANidine  (ZANAFLEX) 4 MG tablet TAKE 1 TABLET BY MOUTH EVERY 6 HOURS  AS NEEDED 360 tablet 0   traMADol (ULTRAM) 50 MG tablet TAKE 1-2 TABLETS BY MOUTH EVERY 8 HOURS AS NEEDED FOR SEVERE(SHOULDER PAIN). CAUTION OF SEDATION 30 tablet 0   verapamil (CALAN-SR) 240 MG CR tablet TAKE 1 TABLET (240 MG TOTAL) BY MOUTH 2 (TWO) TIMES DAILY. 180 tablet 2   No current facility-administered medications on file prior to visit.      Review of Systems  Constitutional:  Negative for chills, fever and malaise/fatigue.  HENT:  Negative for congestion, ear pain, sinus pain and sore throat.   Eyes:  Negative for blurred vision, discharge and redness.  Respiratory:  Negative for cough, shortness of breath and stridor.   Cardiovascular:  Positive for leg swelling. Negative for chest pain and palpitations.  Gastrointestinal:  Negative for abdominal pain, diarrhea, nausea and vomiting.  Genitourinary:  Negative for dysuria, frequency and urgency.  Musculoskeletal:  Positive for joint pain. Negative for myalgias.  Skin:  Negative for rash.  Neurological:  Negative for dizziness and headaches.    Objective:   Physical Exam Constitutional:      General: She is not in acute distress.    Appearance: Normal appearance. She is well-developed. She is obese. She is not ill-appearing or diaphoretic.  HENT:     Head: Normocephalic and atraumatic.  Eyes:     General: No scleral icterus.    Conjunctiva/sclera: Conjunctivae normal.     Pupils: Pupils are equal, round, and reactive to light.  Neck:     Thyroid: No thyromegaly.     Vascular: No carotid bruit or JVD.  Cardiovascular:     Rate and Rhythm: Normal rate and regular rhythm.     Heart sounds: Normal heart sounds.     No gallop.  Pulmonary:     Effort: Pulmonary effort is normal. No respiratory distress.     Breath sounds: Normal breath sounds. No wheezing or rales.  Abdominal:     General: There is no distension or abdominal bruit.     Palpations: Abdomen is soft.   Musculoskeletal:     Cervical back: Normal range of motion and neck supple.     Right lower leg: No edema.     Left lower leg: No edema.     Comments: One plus pedal edema to mid shin   Lymphadenopathy:     Cervical: No cervical adenopathy.  Skin:    General: Skin is warm and dry.     Coloration: Skin is not pale.     Findings: No rash.  Neurological:     Mental Status: She is alert.     Coordination: Coordination normal.     Deep Tendon Reflexes: Reflexes are normal and symmetric. Reflexes normal.  Psychiatric:        Mood and Affect: Mood normal.           Assessment & Plan:   Problem List Items Addressed This Visit       Cardiovascular and Mediastinum   HYPERTENSION, BENIGN - Primary    bp in fair control at this time  BP Readings from Last 1 Encounters:  06/19/22 135/75  No changes needed Most recent labs reviewed  Disc lifstyle change with low sodium diet and exercise  Plan to continue lower dose of triam hct- 37.5-25 mg one pill daily  Adding 10 mg lasix for pedal edema  Verapamil SR 240 mg daily  Lisinopril 5 mg daily   Will check bmet in a week for renal fxn and K  Continues klor con 20 meq daily      Relevant Medications   furosemide (LASIX) 20 MG tablet   triamterene-hydrochlorothiazide (MAXZIDE-25) 37.5-25 MG tablet     Genitourinary   UTI (urinary tract infection)    Symptoms are resolved  Ua clear with small leuks Encouraged to keep up a good water intake      Relevant Orders   POCT Urinalysis Dipstick (Automated) (Completed)     Other   Pedal edema    Worse on 1/2 dose of triam-hct Will add lasix 10 mg daily in am and check bmet in about a week  Elevate legs when able  Supp stockings (pt has some) Less processed/salty foods  More water intake Wt loss may help   Verapamil may worsen this as well      Decreased GFR    Last GFR was down to 45.25 Likely multi factorial Cut her maxzide dose in 1/2 (will try 10 mg lasix for edema)    Encouraged to avoid nsaids  Re check bmet in about a week

## 2022-06-22 ENCOUNTER — Ambulatory Visit: Payer: PPO | Admitting: Speech Pathology

## 2022-06-26 ENCOUNTER — Telehealth: Payer: Self-pay | Admitting: Family Medicine

## 2022-06-26 DIAGNOSIS — I10 Essential (primary) hypertension: Secondary | ICD-10-CM

## 2022-06-26 NOTE — Telephone Encounter (Signed)
-----   Message from Alvina Chou sent at 06/19/2022  4:07 PM EDT ----- Regarding: Lab orders for Tuesday, 6.11.24 Lab orders for non fasting labs, thanks

## 2022-06-27 ENCOUNTER — Other Ambulatory Visit (INDEPENDENT_AMBULATORY_CARE_PROVIDER_SITE_OTHER): Payer: PPO

## 2022-06-27 DIAGNOSIS — I10 Essential (primary) hypertension: Secondary | ICD-10-CM

## 2022-06-28 LAB — BASIC METABOLIC PANEL
BUN: 16 mg/dL (ref 6–23)
CO2: 30 mEq/L (ref 19–32)
Calcium: 9.6 mg/dL (ref 8.4–10.5)
Chloride: 97 mEq/L (ref 96–112)
Creatinine, Ser: 0.94 mg/dL (ref 0.40–1.20)
GFR: 58.54 mL/min — ABNORMAL LOW (ref >60.00)
Glucose, Bld: 79 mg/dL (ref 70–99)
Potassium: 3.8 mEq/L (ref 3.5–5.1)
Sodium: 134 mEq/L — ABNORMAL LOW (ref 135–145)

## 2022-07-03 ENCOUNTER — Telehealth: Payer: Self-pay | Admitting: Family Medicine

## 2022-07-03 NOTE — Telephone Encounter (Signed)
Patient would like to know if she can have a rx sent in for a sinus infection? She stated that she has seen Dr Milinda Antis several times in the last 2 months for a uti,and she is aware of her sinus infection,but she didn't prescribe her anything.She has congestion,pressure,and headache.  [id number] [place], [place] - [address] Phone: [phone]  Fax: [phone]

## 2022-07-03 NOTE — Telephone Encounter (Signed)
How long have her symptoms been going ?  Did it start initially with a head cold or allergies or both? What color is nasal mucous? Any fever Where does her face hurt and is it tender to the touch?

## 2022-07-04 MED ORDER — AMOXICILLIN-POT CLAVULANATE 875-125 MG PO TABS: 1.0000 | ORAL_TABLET | Freq: Two times a day (BID) | ORAL | 0 refills | Status: DC

## 2022-07-04 NOTE — Telephone Encounter (Signed)
I sent in augmentin  Follow up if not improving or if worse

## 2022-07-04 NOTE — Telephone Encounter (Signed)
Called patent  How long have her symptoms been going ? Started Friday  Did it start initially with a head cold or allergies or both? Started as allergies  What color is nasal mucous? Dark yellow  Any fever not sure but she does feel like she has had fever and chills  Where does her face hurt and is it tender to the touch? She is having pain over and under right eye all mucous is in right side.

## 2022-07-04 NOTE — Addendum Note (Signed)
Addended by: Roxy Manns A on: 07/04/2022 01:07 PM   Modules accepted: Orders

## 2022-07-04 NOTE — Telephone Encounter (Signed)
Called patient reviewed all information and repeated back to me. Will call if any questions.  ? ?

## 2022-07-07 DIAGNOSIS — R2681 Unsteadiness on feet: Secondary | ICD-10-CM | POA: Diagnosis not present

## 2022-07-07 DIAGNOSIS — M6281 Muscle weakness (generalized): Secondary | ICD-10-CM | POA: Diagnosis not present

## 2022-07-11 DIAGNOSIS — M6281 Muscle weakness (generalized): Secondary | ICD-10-CM | POA: Diagnosis not present

## 2022-07-11 DIAGNOSIS — R2681 Unsteadiness on feet: Secondary | ICD-10-CM | POA: Diagnosis not present

## 2022-07-12 ENCOUNTER — Other Ambulatory Visit: Payer: Self-pay | Admitting: Family Medicine

## 2022-07-13 DIAGNOSIS — M6281 Muscle weakness (generalized): Secondary | ICD-10-CM | POA: Diagnosis not present

## 2022-07-13 DIAGNOSIS — R2681 Unsteadiness on feet: Secondary | ICD-10-CM | POA: Diagnosis not present

## 2022-07-17 DIAGNOSIS — M6281 Muscle weakness (generalized): Secondary | ICD-10-CM | POA: Diagnosis not present

## 2022-07-17 DIAGNOSIS — R2681 Unsteadiness on feet: Secondary | ICD-10-CM | POA: Diagnosis not present

## 2022-07-19 DIAGNOSIS — M6281 Muscle weakness (generalized): Secondary | ICD-10-CM | POA: Diagnosis not present

## 2022-07-19 DIAGNOSIS — R2681 Unsteadiness on feet: Secondary | ICD-10-CM | POA: Diagnosis not present

## 2022-07-24 DIAGNOSIS — M6281 Muscle weakness (generalized): Secondary | ICD-10-CM | POA: Diagnosis not present

## 2022-07-24 DIAGNOSIS — R2681 Unsteadiness on feet: Secondary | ICD-10-CM | POA: Diagnosis not present

## 2022-07-26 DIAGNOSIS — M6281 Muscle weakness (generalized): Secondary | ICD-10-CM | POA: Diagnosis not present

## 2022-07-26 DIAGNOSIS — R2681 Unsteadiness on feet: Secondary | ICD-10-CM | POA: Diagnosis not present

## 2022-08-01 DIAGNOSIS — M6281 Muscle weakness (generalized): Secondary | ICD-10-CM | POA: Diagnosis not present

## 2022-08-01 DIAGNOSIS — R2681 Unsteadiness on feet: Secondary | ICD-10-CM | POA: Diagnosis not present

## 2022-08-04 DIAGNOSIS — R2681 Unsteadiness on feet: Secondary | ICD-10-CM | POA: Diagnosis not present

## 2022-08-04 DIAGNOSIS — M6281 Muscle weakness (generalized): Secondary | ICD-10-CM | POA: Diagnosis not present

## 2022-08-08 ENCOUNTER — Ambulatory Visit (INDEPENDENT_AMBULATORY_CARE_PROVIDER_SITE_OTHER): Payer: PPO

## 2022-08-08 VITALS — Ht 67.0 in | Wt 235.0 lb

## 2022-08-08 DIAGNOSIS — Z1231 Encounter for screening mammogram for malignant neoplasm of breast: Secondary | ICD-10-CM | POA: Diagnosis not present

## 2022-08-08 DIAGNOSIS — Z0000 Encounter for general adult medical examination without abnormal findings: Secondary | ICD-10-CM

## 2022-08-08 NOTE — Patient Instructions (Signed)
Amber Miles , Thank you for taking time to come for your Medicare Wellness Visit. I appreciate your ongoing commitment to your health goals. Please review the following plan we discussed and let me know if I can assist you in the future.   These are the goals we discussed:  Goals       Patient Stated      Starting 07/15/2018, I will continue to take medications as prescribed.       Patient Stated      07/22/2019, I will maintain and continue medications as prescribed.       Patient Stated      08/03/2020, I will maintain and continue medications as prescribed.      Patient Stated      Stay active       Stay healthy (pt-stated)        This is a list of the screening recommended for you and due dates:  Health Maintenance  Topic Date Due   DTaP/Tdap/Td vaccine (2 - Tdap) 12/03/2021   COVID-19 Vaccine (7 - 2023-24 season) 03/16/2022   Medicare Annual Wellness Visit  08/05/2022   Flu Shot  08/17/2022   Mammogram  12/06/2022   Colon Cancer Screening  10/14/2025   Pneumonia Vaccine  Completed   DEXA scan (bone density measurement)  Completed   Hepatitis C Screening  Completed   Zoster (Shingles) Vaccine  Completed   HPV Vaccine  Aged Out   Managing Pain Without Opioids Opioids are strong medicines used to treat moderate to severe pain. For some people, especially those who have long-term (chronic) pain, opioids may not be the best choice for pain management due to: Side effects like nausea, constipation, and sleepiness. The risk of addiction (opioid use disorder). The longer you take opioids, the greater your risk of addiction. Pain that lasts for more than 3 months is called chronic pain. Managing chronic pain usually requires more than one approach and is often provided by a team of health care providers working together (multidisciplinary approach). Pain management may be done at a pain management center or pain clinic. How to manage pain without the use of opioids Use non-opioid  medicines Non-opioid medicines for pain may include: Over-the-counter or prescription non-steroidal anti-inflammatory drugs (NSAIDs). These may be the first medicines used for pain. They work well for muscle and bone pain, and they reduce swelling. Acetaminophen. This over-the-counter medicine may work well for milder pain but not swelling. Antidepressants. These may be used to treat chronic pain. A certain type of antidepressant (tricyclics) is often used. These medicines are given in lower doses for pain than when used for depression. Anticonvulsants. These are usually used to treat seizures but may also reduce nerve (neuropathic) pain. Muscle relaxants. These relieve pain caused by sudden muscle tightening (spasms). You may also use a pain medicine that is applied to the skin as a patch, cream, or gel (topical analgesic), such as a numbing medicine. These may cause fewer side effects than medicines taken by mouth. Do certain therapies as directed Some therapies can help with pain management. They include: Physical therapy. You will do exercises to gain strength and flexibility. A physical therapist may teach you exercises to move and stretch parts of your body that are weak, stiff, or painful. You can learn these exercises at physical therapy visits and practice them at home. Physical therapy may also involve: Massage. Heat wraps or applying heat or cold to affected areas. Electrical signals that interrupt pain signals (transcutaneous  electrical nerve stimulation, TENS). Weak lasers that reduce pain and swelling (low-level laser therapy). Signals from your body that help you learn to regulate pain (biofeedback). Occupational therapy. This helps you to learn ways to function at home and work with less pain. Recreational therapy. This involves trying new activities or hobbies, such as a physical activity or drawing. Mental health therapy, including: Cognitive behavioral therapy (CBT). This helps  you learn coping skills for dealing with pain. Acceptance and commitment therapy (ACT) to change the way you think and react to pain. Relaxation therapies, including muscle relaxation exercises and mindfulness-based stress reduction. Pain management counseling. This may be individual, family, or group counseling.  Receive medical treatments Medical treatments for pain management include: Nerve block injections. These may include a pain blocker and anti-inflammatory medicines. You may have injections: Near the spine to relieve chronic back or neck pain. Into joints to relieve back or joint pain. Into nerve areas that supply a painful area to relieve body pain. Into muscles (trigger point injections) to relieve some painful muscle conditions. A medical device placed near your spine to help block pain signals and relieve nerve pain or chronic back pain (spinal cord stimulation device). Acupuncture. Follow these instructions at home Medicines Take over-the-counter and prescription medicines only as told by your health care provider. If you are taking pain medicine, ask your health care providers about possible side effects to watch out for. Do not drive or use heavy machinery while taking prescription opioid pain medicine. Lifestyle  Do not use drugs or alcohol to reduce pain. If you drink alcohol, limit how much you have to: 0-1 drink a day for women who are not pregnant. 0-2 drinks a day for men. Know how much alcohol is in a drink. In the [place] one drink equals one 12 oz bottle of beer (355 mL), one 5 oz glass of wine (148 mL), or one 1 oz glass of hard liquor (44 mL). Do not use any products that contain nicotine or tobacco. These products include cigarettes, chewing tobacco, and vaping devices, such as e-cigarettes. If you need help quitting, ask your health care provider. Eat a healthy diet and maintain a healthy weight. Poor diet and excess weight may make pain worse. Eat foods that  are high in fiber. These include fresh fruits and vegetables, whole grains, and beans. Limit foods that are high in fat and processed sugars, such as fried and sweet foods. Exercise regularly. Exercise lowers stress and may help relieve pain. Ask your health care provider what activities and exercises are safe for you. If your health care provider approves, join an exercise class that combines movement and stress reduction. Examples include yoga and tai chi. Get enough sleep. Lack of sleep may make pain worse. Lower stress as much as possible. Practice stress reduction techniques as told by your therapist. General instructions Work with all your pain management providers to find the treatments that work best for you. You are an important member of your pain management team. There are many things you can do to reduce pain on your own. Consider joining an online or in-person support group for people who have chronic pain. Keep all follow-up visits. This is important. Where to find more information You can find more information about managing pain without opioids from: American Academy of Pain Medicine: painmed.org Institute for Chronic Pain: instituteforchronicpain.org American Chronic Pain Association: theacpa.org Contact a health care provider if: You have side effects from pain medicine. Your pain gets worse or  does not get better with treatments or home therapy. You are struggling with anxiety or depression. Summary Many types of pain can be managed without opioids. Chronic pain may respond better to pain management without opioids. Pain is best managed when you and a team of health care providers work together. Pain management without opioids may include non-opioid medicines, medical treatments, physical therapy, mental health therapy, and lifestyle changes. Tell your health care providers if your pain gets worse or is not being managed well enough. This information is not intended to  replace advice given to you by your health care provider. Make sure you discuss any questions you have with your health care provider. Document Revised: 04/14/2020 Document Reviewed: 04/14/2020 Elsevier Patient Education  2024 [employer]   Advanced directives: Please bring a copy of your health care power of attorney and living will to the office to be added to your chart at your convenience.   Conditions/risks identified: none  Next appointment: Follow up in one year for your annual wellness visit 08/09/23 @ 1:30 telephone visit   Preventive Care 65 Years and Older, Female Preventive care refers to lifestyle choices and visits with your health care provider that can promote health and wellness. What does preventive care include? A yearly physical exam. This is also called an annual well check. Dental exams once or twice a year. Routine eye exams. Ask your health care provider how often you should have your eyes checked. Personal lifestyle choices, including: Daily care of your teeth and gums. Regular physical activity. Eating a healthy diet. Avoiding tobacco and drug use. Limiting alcohol use. Practicing safe sex. Taking low-dose aspirin every day. Taking vitamin and mineral supplements as recommended by your health care provider. What happens during an annual well check? The services and screenings done by your health care provider during your annual well check will depend on your age, overall health, lifestyle risk factors, and family history of disease. Counseling  Your health care provider may ask you questions about your: Alcohol use. Tobacco use. Drug use. Emotional well-being. Home and relationship well-being. Sexual activity. Eating habits. History of falls. Memory and ability to understand (cognition). Work and work Astronomer. Reproductive health. Screening  You may have the following tests or measurements: Height, weight, and BMI. Blood pressure. Lipid  and cholesterol levels. These may be checked every 5 years, or more frequently if you are over 52 years old. Skin check. Lung cancer screening. You may have this screening every year starting at age 63 if you have a 30-pack-year history of smoking and currently smoke or have quit within the past 15 years. Fecal occult blood test (FOBT) of the stool. You may have this test every year starting at age 81. Flexible sigmoidoscopy or colonoscopy. You may have a sigmoidoscopy every 5 years or a colonoscopy every 10 years starting at age 49. Hepatitis C blood test. Hepatitis B blood test. Sexually transmitted disease (STD) testing. Diabetes screening. This is done by checking your blood sugar (glucose) after you have not eaten for a while (fasting). You may have this done every 1-3 years. Bone density scan. This is done to screen for osteoporosis. You may have this done starting at age 35. Mammogram. This may be done every 1-2 years. Talk to your health care provider about how often you should have regular mammograms. Talk with your health care provider about your test results, treatment options, and if necessary, the need for more tests. Vaccines  Your health care provider may recommend  certain vaccines, such as: Influenza vaccine. This is recommended every year. Tetanus, diphtheria, and acellular pertussis (Tdap, Td) vaccine. You may need a Td booster every 10 years. Zoster vaccine. You may need this after age 15. Pneumococcal 13-valent conjugate (PCV13) vaccine. One dose is recommended after age 50. Pneumococcal polysaccharide (PPSV23) vaccine. One dose is recommended after age 47. Talk to your health care provider about which screenings and vaccines you need and how often you need them. This information is not intended to replace advice given to you by your health care provider. Make sure you discuss any questions you have with your health care provider. Document Released: 01/29/2015 Document  Revised: 09/22/2015 Document Reviewed: 11/03/2014 Elsevier Interactive Patient Education  2017 [employer].  Fall Prevention in the Home Falls can cause injuries. They can happen to people of all ages. There are many things you can do to make your home safe and to help prevent falls. What can I do on the outside of my home? Regularly fix the edges of walkways and driveways and fix any cracks. Remove anything that might make you trip as you walk through a door, such as a raised step or threshold. Trim any bushes or trees on the path to your home. Use bright outdoor lighting. Clear any walking paths of anything that might make someone trip, such as rocks or tools. Regularly check to see if handrails are loose or broken. Make sure that both sides of any steps have handrails. Any raised decks and porches should have guardrails on the edges. Have any leaves, snow, or ice cleared regularly. Use sand or salt on walking paths during winter. Clean up any spills in your garage right away. This includes oil or grease spills. What can I do in the bathroom? Use night lights. Install grab bars by the toilet and in the tub and shower. Do not use towel bars as grab bars. Use non-skid mats or decals in the tub or shower. If you need to sit down in the shower, use a plastic, non-slip stool. Keep the floor dry. Clean up any water that spills on the floor as soon as it happens. Remove soap buildup in the tub or shower regularly. Attach bath mats securely with double-sided non-slip rug tape. Do not have throw rugs and other things on the floor that can make you trip. What can I do in the bedroom? Use night lights. Make sure that you have a light by your bed that is easy to reach. Do not use any sheets or blankets that are too big for your bed. They should not hang down onto the floor. Have a firm chair that has side arms. You can use this for support while you get dressed. Do not have throw rugs and other  things on the floor that can make you trip. What can I do in the kitchen? Clean up any spills right away. Avoid walking on wet floors. Keep items that you use a lot in easy-to-reach places. If you need to reach something above you, use a strong step stool that has a grab bar. Keep electrical cords out of the way. Do not use floor polish or wax that makes floors slippery. If you must use wax, use non-skid floor wax. Do not have throw rugs and other things on the floor that can make you trip. What can I do with my stairs? Do not leave any items on the stairs. Make sure that there are handrails on both sides of the  stairs and use them. Fix handrails that are broken or loose. Make sure that handrails are as long as the stairways. Check any carpeting to make sure that it is firmly attached to the stairs. Fix any carpet that is loose or worn. Avoid having throw rugs at the top or bottom of the stairs. If you do have throw rugs, attach them to the floor with carpet tape. Make sure that you have a light switch at the top of the stairs and the bottom of the stairs. If you do not have them, ask someone to add them for you. What else can I do to help prevent falls? Wear shoes that: Do not have high heels. Have rubber bottoms. Are comfortable and fit you well. Are closed at the toe. Do not wear sandals. If you use a stepladder: Make sure that it is fully opened. Do not climb a closed stepladder. Make sure that both sides of the stepladder are locked into place. Ask someone to hold it for you, if possible. Clearly mark and make sure that you can see: Any grab bars or handrails. First and last steps. Where the edge of each step is. Use tools that help you move around (mobility aids) if they are needed. These include: Canes. Walkers. Scooters. Crutches. Turn on the lights when you go into a dark area. Replace any light bulbs as soon as they burn out. Set up your furniture so you have a clear  path. Avoid moving your furniture around. If any of your floors are uneven, fix them. If there are any pets around you, be aware of where they are. Review your medicines with your doctor. Some medicines can make you feel dizzy. This can increase your chance of falling. Ask your doctor what other things that you can do to help prevent falls. This information is not intended to replace advice given to you by your health care provider. Make sure you discuss any questions you have with your health care provider. Document Released: 10/29/2008 Document Revised: 06/10/2015 Document Reviewed: 02/06/2014 Elsevier Interactive Patient Education  2017 [employer].

## 2022-08-08 NOTE — Progress Notes (Signed)
Subjective:   Amber Miles is a 77 y.o. female who presents for Medicare Annual (Subsequent) preventive examination.  Visit Complete: Virtual  I connected with  Melton Alar on 08/08/22 by a audio enabled telemedicine application and verified that I am speaking with the correct person using two identifiers.  Patient Location: Home  Provider Location: Office/Clinic  I discussed the limitations of evaluation and management by telemedicine. The patient expressed understanding and agreed to proceed.   Review of Systems      Cardiac Risk Factors include: advanced age (>25men, >58 women);dyslipidemia;hypertension  Patient was unable to self-report vital signs via telehealth due to a lack of equipment at home.      Objective:    Today's Vitals   08/08/22 1117  Weight: 235 lb (106.6 kg)  Height: 5\' 7"  (1.702 m)   Body mass index is 36.81 kg/m.     08/08/2022   11:26 AM 04/10/2022    2:05 PM 08/04/2021    2:14 PM 08/03/2020    1:20 PM 07/22/2020    2:08 PM 12/23/2019    4:39 PM 07/22/2019    2:45 PM  Advanced Directives  Does Patient Have a Medical Advance Directive? Yes Yes Yes Yes Yes Yes Yes  Type of Estate agent of [place];Living will Healthcare Power of [place];Living will Healthcare Power of [place];Living will Healthcare Power of [place];Living will Healthcare Power of [place];Living will Healthcare Power of [place];Living will Healthcare Power of [place];Living will  Does patient want to make changes to medical advance directive?  No - Patient declined No - Patient declined  No - Patient declined No - Patient declined   Copy of Healthcare Power of Attorney in Chart? No - copy requested No - copy requested No - copy requested No - copy requested   No - copy requested    Current Medications (verified) Outpatient Encounter Medications as of 08/08/2022  Medication Sig   acyclovir (ZOVIRAX) 800 MG tablet TAKE 1 TABLET BY MOUTH 3 TIMES DAILY FOR 3 DAYS  FLARES   albuterol (VENTOLIN HFA) 108 (90 Base) MCG/ACT inhaler INHALE 1 TO 2 PUFFS EVERY 4 TO 6 HOURS AS NEEDED FOR WHEEZE OR COUGH   ARNUITY ELLIPTA 200 MCG/ACT AEPB Inhale 1 puff into the lungs daily.   azelastine (ASTELIN) 0.1 % nasal spray Place 1 spray into both nostrils 2 (two) times daily.   cetirizine (ZYRTEC) 5 MG tablet Take 5 mg by mouth daily.   Docusate Calcium (STOOL SOFTENER PO) Take 1 tablet by mouth daily as needed.   esomeprazole (NEXIUM) 40 MG capsule TAKE 1 CAPSULE BY MOUTH EVERY DAY BEFORE BREAKFAST   estradiol (VIVELLE-DOT) 0.0375 MG/24HR PLACE 1 PATCH ONTO THE SKIN 2 TIMES A WEEK.   furosemide (LASIX) 20 MG tablet TAKE 1/2 TABLET BY MOUTH DAILY   GLUCOSAMINE-CHONDROITIN-VIT D3 PO Take 1 capsule by mouth 2 (two) times daily.   lisinopril (ZESTRIL) 5 MG tablet TAKE 1 TABLET BY MOUTH EVERY DAY   montelukast (SINGULAIR) 10 MG tablet Take 10 mg by mouth daily.    Multiple Vitamin (MULTIVITAMIN) tablet Take 1 tablet by mouth daily.     naproxen sodium (ALEVE) 220 MG tablet Take 220 mg by mouth every 12 (twelve) hours.   potassium chloride SA (KLOR-CON M20) 20 MEQ tablet TAKE 1 TABLET BY MOUTH EVERY DAY   SYNTHROID 112 MCG tablet Take 112 mcg by mouth daily.   tiZANidine (ZANAFLEX) 4 MG tablet TAKE 1 TABLET BY MOUTH EVERY 6 HOURS AS  NEEDED   traMADol (ULTRAM) 50 MG tablet TAKE 1-2 TABLETS BY MOUTH EVERY 8 HOURS AS NEEDED FOR SEVERE(SHOULDER PAIN). CAUTION OF SEDATION   triamterene-hydrochlorothiazide (MAXZIDE-25) 37.5-25 MG tablet Take 1 tablet by mouth daily.   verapamil (CALAN-SR) 240 MG CR tablet TAKE 1 TABLET (240 MG TOTAL) BY MOUTH 2 (TWO) TIMES DAILY.   amoxicillin-clavulanate (AUGMENTIN) 875-125 MG tablet Take 1 tablet by mouth 2 (two) times daily. (Patient not taking: Reported on 08/08/2022)   Dextromethorphan-guaiFENesin (DELSYM COUGH/CHEST CONGEST DM) 5-100 MG/5ML LIQD Take by mouth. (Patient not taking: Reported on 08/08/2022)   No facility-administered encounter  medications on file as of 08/08/2022.    Allergies (verified) Neosporin [neomycin-polymyxin-gramicidin] and Tape   History: Past Medical History:  Diagnosis Date   Allergy    allergic rhinitis   Anxiety    Arthritis    OA knee replacement   Cataract    Degenerative disc disease, thoracic    Family history of breast cancer    Family history of genetic disease carrier    paternal cousin has NBN mutation   GERD (gastroesophageal reflux disease)    Hyperlipidemia    Hypertension    Hypothyroid    Reactive airways dysfunction syndrome (HCC)    Skin lesions, generalized    squamous cell skin lesions and basal cell skin lesions   Past Surgical History:  Procedure Laterality Date   ABDOMINAL HYSTERECTOMY  1990s   hyst with oophrectomy for B9lesion/cyst and prolapse   BREAST BIOPSY Bilateral    benign   JOINT REPLACEMENT  05/2009   left total knee replacement   recocele     and cystocele repair   TONSILLECTOMY     tsa     Family History  Problem Relation Age of Onset   Hypertension Mother    Stroke Mother    Depression Mother    Alzheimer's disease Mother    Heart disease Father    Cerebral palsy Father    Hypertension Sister    Breast cancer Cousin 48       NBN mutation   Melanoma Maternal Uncle        dx >50   Social History   Socioeconomic History   Marital status: Widowed    Spouse name: Not on file   Number of children: Not on file   Years of education: Not on file   Highest education level: Not on file  Occupational History   Not on file  Tobacco Use   Smoking status: Never   Smokeless tobacco: Never  Vaping Use   Vaping status: Never Used  Substance and Sexual Activity   Alcohol use: No    Alcohol/week: 0.0 standard drinks of alcohol   Drug use: No   Sexual activity: Not Currently  Other Topics Concern   Not on file  Social History Narrative   Not on file   Social Determinants of Health   Financial Resource Strain: Low Risk  (08/08/2022)    Overall Financial Resource Strain (CARDIA)    Difficulty of Paying Living Expenses: Not hard at all  Food Insecurity: No Food Insecurity (08/08/2022)   Hunger Vital Sign    Worried About Running Out of Food in the Last Year: Never true    Ran Out of Food in the Last Year: Never true  Transportation Needs: No Transportation Needs (08/08/2022)   PRAPARE - Administrator, Civil Service (Medical): No    Lack of Transportation (Non-Medical): No  Physical Activity: Sufficiently Active (  08/08/2022)   Exercise Vital Sign    Days of Exercise per Week: 7 days    Minutes of Exercise per Session: 60 min  Stress: No Stress Concern Present (08/08/2022)   [employer] of Occupational Health - Occupational Stress Questionnaire    Feeling of Stress : Not at all  Social Connections: Moderately Integrated (08/08/2022)   Social Connection and Isolation Panel [NHANES]    Frequency of Communication with Friends and Family: More than three times a week    Frequency of Social Gatherings with Friends and Family: More than three times a week    Attends Religious Services: More than 4 times per year    Active Member of [employer] or Organizations: Yes    Attends Banker Meetings: More than 4 times per year    Marital Status: Widowed    Tobacco Counseling Counseling given: Not Answered   Clinical Intake:  Pre-visit preparation completed: Yes  Pain : No/denies pain     BMI - recorded: 36.81 Nutritional Status: BMI > 30  Obese Nutritional Risks: None Diabetes: No  How often do you need to have someone help you when you read instructions, pamphlets, or other written materials from your doctor or pharmacy?: 1 - Never  Interpreter Needed?: No  Information entered by :: C.Haidar Muse LPN   Activities of Daily Living    08/08/2022   11:27 AM  In your present state of health, do you have any difficulty performing the following activities:  Hearing? 1  Comment occasionally when on  the phone  Vision? 0  Difficulty concentrating or making decisions? 0  Walking or climbing stairs? 0  Dressing or bathing? 0  Doing errands, shopping? 0  Preparing Food and eating ? N  Using the Toilet? N  In the past six months, have you accidently leaked urine? Y  Comment occasionally if waits to long  Do you have problems with loss of bowel control? N  Managing your Medications? N  Managing your Finances? N  Housekeeping or managing your Housekeeping? N    Patient Care Team: [place], Audrie Gallus, MD as PCP - Veto Kemps, MD as Consulting Physician (Ophthalmology)  Indicate any recent Medical Services you may have received from other than [facility] providers in the past year (date may be approximate).     Assessment:   This is a routine wellness examination for [place].  Hearing/Vision screen Hearing Screening - Comments:: Occasionally when on the phone  Vision Screening - Comments:: Glasses - [facility] - UTD on eye exams  Dietary issues and exercise activities discussed:     Goals Addressed             This Visit's Progress    Patient Stated       Stay active        Depression Screen    08/08/2022   11:24 AM 06/19/2022    3:03 PM 05/22/2022    3:37 PM 08/04/2021    2:10 PM 08/03/2020    1:23 PM 07/22/2020    2:17 PM 07/22/2019    2:47 PM  PHQ 2/9 Scores  PHQ - 2 Score 0 0 0 0 0 0 0  PHQ- 9 Score     0  0    Fall Risk    08/08/2022   11:27 AM 06/19/2022    3:02 PM 05/22/2022    3:37 PM 08/04/2021    2:13 PM 08/03/2020    1:23 PM  Fall Risk   Falls  in the past year? 0 0 0 0 1  Number falls in past yr: 0 0 0 0 0  Injury with Fall? 0 0 0 0 0  Risk for fall due to : No Fall Risks No Fall Risks  No Fall Risks Medication side effect  Follow up Falls prevention discussed;Falls evaluation completed Falls evaluation completed Falls evaluation completed;Education provided;Falls prevention discussed  Falls evaluation completed;Falls prevention discussed     MEDICARE RISK AT HOME:  Medicare Risk at Home - 08/08/22 1128     Any stairs in or around the home? Yes   has ramp   If so, are there any without handrails? No    Home free of loose throw rugs in walkways, pet beds, electrical cords, etc? Yes    Adequate lighting in your home to reduce risk of falls? Yes    Life alert? No    Use of a cane, walker or w/c? No    Grab bars in the bathroom? Yes    Shower chair or bench in shower? Yes    Elevated toilet seat or a handicapped toilet? Yes             TIMED UP AND GO:  Was the test performed?  No    Cognitive Function:    08/03/2020    1:25 PM 07/22/2019    2:49 PM 07/15/2018   11:12 AM 07/02/2017    9:09 AM 06/23/2016    9:39 AM  MMSE - Mini Mental State Exam  Orientation to time 5 5 5 5 5   Orientation to Place 5 5 5 5 5   Registration 3 3 3 3 3   Attention/ Calculation 5 5 0 0 0  Recall 3 3 3 3 3   Language- name 2 objects   0 0 0  Language- repeat 1 1 1 1 1   Language- follow 3 step command   0 3 3  Language- read & follow direction   0 0 0  Write a sentence   0 0 0  Copy design   0 0 0  Total score   17 20 20         08/08/2022   11:29 AM 08/04/2021    2:15 PM  6CIT Screen  What Year? 0 points 0 points  What month? 0 points 0 points  What time? 0 points 0 points  Count back from 20 0 points 0 points  Months in reverse 0 points 0 points  Repeat phrase 0 points 0 points  Total Score 0 points 0 points    Immunizations Immunization History  Administered Date(s) Administered   COVID-19, mRNA, vaccine(Comirnaty)12 years and older 11/14/2021   Fluad Quad(high Dose 65+) 10/09/2019, 10/05/2021   Influenza Split 10/18/2010   Influenza Whole 11/30/2006, 10/28/2008, 10/27/2009   Influenza, High Dose Seasonal PF 11/11/2015, 10/05/2016, 10/18/2017, 11/11/2018, 10/29/2020   Influenza-Unspecified 10/07/2012, 08/30/2013, 10/01/2014   PFIZER Comirnaty(Gray Top)Covid-19 Tri-Sucrose Vaccine 06/09/2020   PFIZER(Purple  Top)SARS-COV-2 Vaccination 02/04/2019, 02/24/2019, 10/20/2019   Pfizer Covid-19 Vaccine Bivalent Booster 60yrs & up 11/02/2020   Pneumococcal Conjugate-13 05/27/2014   Pneumococcal Polysaccharide-23 12/04/2011, 10/07/2015, 10/05/2016, 10/04/2017, 05/15/2019, 05/06/2020, 05/12/2021   Respiratory Syncytial Virus Vaccine,Recomb Aduvanted(Arexvy) 11/28/2021   Td 12/04/2011   Zoster Recombinant(Shingrix) 08/06/2019, 10/09/2019   Zoster, Live 10/05/2010    TDAP status: Due, Education has been provided regarding the importance of this vaccine. Advised may receive this vaccine at local pharmacy or Health Dept. Aware to provide a copy of the vaccination record if obtained from  local pharmacy or Health Dept. Verbalized acceptance and understanding.  Flu Vaccine status: Up to date  Pneumococcal vaccine status: Up to date  Covid-19 vaccine status: Completed vaccines  Qualifies for Shingles Vaccine? Yes   Zostavax completed Yes   Shingrix Completed?: Yes  Screening Tests Health Maintenance  Topic Date Due   DTaP/Tdap/Td (2 - Tdap) 12/03/2021   COVID-19 Vaccine (7 - 2023-24 season) 03/16/2022   Medicare Annual Wellness (AWV)  08/05/2022   INFLUENZA VACCINE  08/17/2022   MAMMOGRAM  12/06/2022   Colonoscopy  10/14/2025   Pneumonia Vaccine 60+ Years old  Completed   DEXA SCAN  Completed   Hepatitis C Screening  Completed   Zoster Vaccines- Shingrix  Completed   HPV VACCINES  Aged Out    Health Maintenance  Health Maintenance Due  Topic Date Due   DTaP/Tdap/Td (2 - Tdap) 12/03/2021   COVID-19 Vaccine (7 - 2023-24 season) 03/16/2022   Medicare Annual Wellness (AWV)  08/05/2022    Colorectal cancer screening: Type of screening: Colonoscopy. Completed 10/14/20. Repeat every 5 years  Mammogram status: Ordered 08/08/22. Pt provided with contact info and advised to call to schedule appt.   Bone Density status: Completed 05/19/20. Results reflect: Bone density results: NORMAL. Repeat every  5 years.  Lung Cancer Screening: (Low Dose CT Chest recommended if Age 47-80 years, 20 pack-year currently smoking OR have quit w/in 15years.) does not qualify.   Lung Cancer Screening Referral: n/a  Additional Screening:  Hepatitis C Screening: does qualify; Completed 06/22/15  Vision Screening: Recommended annual ophthalmology exams for early detection of glaucoma and other disorders of the eye. Is the patient up to date with their annual eye exam?  Yes  Who is the provider or what is the name of the office in which the patient attends annual eye exams? [facility] If pt is not established with a provider, would they like to be referred to a provider to establish care? Yes .   Dental Screening: Recommended annual dental exams for proper oral hygiene    Community Resource Referral / Chronic Care Management: CRR required this visit?  No   CCM required this visit?  No     Plan:     I have personally reviewed and noted the following in the patient's chart:   Medical and social history Use of alcohol, tobacco or illicit drugs  Current medications and supplements including opioid prescriptions. Patient is currently taking opioid prescriptions. Information provided to patient regarding non-opioid alternatives. Patient advised to discuss non-opioid treatment plan with their provider. Functional ability and status Nutritional status Physical activity Advanced directives List of other physicians Hospitalizations, surgeries, and ER visits in previous 12 months Vitals Screenings to include cognitive, depression, and falls Referrals and appointments  In addition, I have reviewed and discussed with patient certain preventive protocols, quality metrics, and best practice recommendations. A written personalized care plan for preventive services as well as general preventive health recommendations were provided to patient.     Maryan Puls, LPN   1/61/0960    After Visit Summary: (Declined) Due to this being a telephonic visit, with patients personalized plan was offered to patient but patient Declined AVS at this time   Nurse Notes: none

## 2022-08-13 ENCOUNTER — Other Ambulatory Visit: Payer: Self-pay | Admitting: Family Medicine

## 2022-08-16 ENCOUNTER — Other Ambulatory Visit: Payer: Self-pay | Admitting: Family Medicine

## 2022-08-18 ENCOUNTER — Other Ambulatory Visit: Payer: Self-pay | Admitting: Family Medicine

## 2022-08-22 ENCOUNTER — Ambulatory Visit: Payer: PPO | Admitting: Podiatry

## 2022-08-22 DIAGNOSIS — M79675 Pain in left toe(s): Secondary | ICD-10-CM

## 2022-08-22 DIAGNOSIS — M2041 Other hammer toe(s) (acquired), right foot: Secondary | ICD-10-CM

## 2022-08-22 DIAGNOSIS — M79674 Pain in right toe(s): Secondary | ICD-10-CM | POA: Diagnosis not present

## 2022-08-22 DIAGNOSIS — M2042 Other hammer toe(s) (acquired), left foot: Secondary | ICD-10-CM

## 2022-08-22 DIAGNOSIS — B351 Tinea unguium: Secondary | ICD-10-CM | POA: Diagnosis not present

## 2022-08-22 NOTE — Progress Notes (Signed)
   No chief complaint on file.   SUBJECTIVE Patient presents to office today complaining of elongated, thickened nails that cause pain while ambulating in shoes.  Patient is unable to trim their own nails.  Patient also requesting additional silicone toe caps to alleviate pressure from her hammertoe deformity.  Patient is here for further evaluation and treatment.  Past Medical History:  Diagnosis Date   Allergy    allergic rhinitis   Anxiety    Arthritis    OA knee replacement   Cataract    Degenerative disc disease, thoracic    Family history of breast cancer    Family history of genetic disease carrier    paternal cousin has NBN mutation   GERD (gastroesophageal reflux disease)    Hyperlipidemia    Hypertension    Hypothyroid    Reactive airways dysfunction syndrome (HCC)    Skin lesions, generalized    squamous cell skin lesions and basal cell skin lesions    Allergies  Allergen Reactions   Neosporin [Neomycin-Polymyxin-Gramicidin] Other (See Comments)    blisters   Tape Swelling    Itching and redness Pt said severe reaction to surgical tape.? latex     OBJECTIVE General Patient is awake, alert, and oriented x 3 and in no acute distress. Derm Skin is dry and supple bilateral. Negative open lesions or macerations. Remaining integument unremarkable. Nails are tender, long, thickened and dystrophic with subungual debris, consistent with onychomycosis, 1-5 bilateral. No signs of infection noted. Vasc  DP and PT pedal pulses palpable bilaterally. Temperature gradient within normal limits.  Neuro Epicritic and protective threshold sensation grossly intact bilaterally.  Musculoskeletal Exam No symptomatic pedal deformities noted bilateral. Muscular strength within normal limits.  ASSESSMENT 1.  Pain due to onychomycosis of toenails both 2.  Hammertoes bilateral 3.  Capsulitis/DJD right ankle; currently asymptomatic  PLAN OF CARE -Patient evaluated today.  -Instructed  to maintain good pedal hygiene and foot care.  -Mechanical debridement of nails 1-5 bilaterally performed using a nail nipper. Filed with dremel without incident.  -Continue silicone toe caps for the hammertoes to alleviate pressure from the distal tips of the toes -Continue to refrain from going barefoot.  Recommend good supportive tennis shoes -Return to clinic 3 months routine footcare   Felecia Shelling, DPM [facility]  Dr. Felecia Shelling, DPM    [address] [place], [place] [address]                Office [phone]  Fax [phone]

## 2022-09-03 NOTE — Progress Notes (Unsigned)
    Mylei Brackeen T. Coyle Stordahl, MD, CAQ [facility] at [facility] [address] [place], [address]  Phone: [phone]  FAX: [phone]  Amber Miles - 77 y.o. female  MRN [id number]  Date of Birth: [birth year]  Date: 09/04/2022  PCP: Judy Pimple, MD  Referral: Judy Pimple, MD  No chief complaint on file.  Subjective:   Amber Miles is a 77 y.o. very pleasant female patient with There is no height or weight on file to calculate BMI. who presents with the following:  The patient presents with a possible UTI.    Review of Systems is noted in the HPI, as appropriate  Objective:   There were no vitals taken for this visit.  GEN: No acute distress; alert,appropriate. PULM: Breathing comfortably in no respiratory distress PSYCH: Normally interactive.   Laboratory and Imaging Data:  Assessment and Plan:   ***

## 2022-09-04 ENCOUNTER — Encounter: Payer: Self-pay | Admitting: Family Medicine

## 2022-09-04 ENCOUNTER — Ambulatory Visit (INDEPENDENT_AMBULATORY_CARE_PROVIDER_SITE_OTHER): Payer: PPO | Admitting: Family Medicine

## 2022-09-04 VITALS — BP 130/60 | HR 59 | Temp 98.1°F | Ht 67.0 in | Wt 237.4 lb

## 2022-09-04 DIAGNOSIS — R300 Dysuria: Secondary | ICD-10-CM

## 2022-09-04 DIAGNOSIS — N3090 Cystitis, unspecified without hematuria: Secondary | ICD-10-CM

## 2022-09-04 LAB — POC URINALSYSI DIPSTICK (AUTOMATED)
Bilirubin, UA: NEGATIVE
Blood, UA: NEGATIVE
Glucose, UA: NEGATIVE
Ketones, UA: NEGATIVE
Nitrite, UA: NEGATIVE
Protein, UA: NEGATIVE
Spec Grav, UA: 1.01 (ref 1.010–1.025)
Urobilinogen, UA: 0.2 E.U./dL
pH, UA: 6 (ref 5.0–8.0)

## 2022-09-04 MED ORDER — SULFAMETHOXAZOLE-TRIMETHOPRIM 800-160 MG PO TABS: 1.0000 | ORAL_TABLET | Freq: Two times a day (BID) | ORAL | 0 refills | Status: DC

## 2022-09-06 DIAGNOSIS — K219 Gastro-esophageal reflux disease without esophagitis: Secondary | ICD-10-CM | POA: Diagnosis not present

## 2022-09-06 DIAGNOSIS — J383 Other diseases of vocal cords: Secondary | ICD-10-CM | POA: Diagnosis not present

## 2022-09-06 DIAGNOSIS — R490 Dysphonia: Secondary | ICD-10-CM | POA: Diagnosis not present

## 2022-09-06 DIAGNOSIS — J301 Allergic rhinitis due to pollen: Secondary | ICD-10-CM | POA: Diagnosis not present

## 2022-09-07 LAB — URINE CULTURE
MICRO NUMBER:: 15349239
SPECIMEN QUALITY:: ADEQUATE

## 2022-09-19 ENCOUNTER — Other Ambulatory Visit: Payer: Self-pay | Admitting: Family Medicine

## 2022-09-21 NOTE — Telephone Encounter (Signed)
Name of Medication: Tramadol Name of Pharmacy: [name] Last Fill or Written Date and Quantity: 09/06/21 #30 tab/ 0 refill Last Office Visit and Type: acute with another provider 09/04/22 (f/u 06/19/22) Next Office Visit and Type: none scheduled   Zanaflex filled on 10/17/21 #360 tabs/ 0 refill

## 2022-09-22 ENCOUNTER — Other Ambulatory Visit: Payer: Self-pay | Admitting: Family Medicine

## 2022-09-22 NOTE — Telephone Encounter (Signed)
Last filled on 02/24/22 #24 patches with 1 refill, last f/u was on 06/19/22

## 2022-10-02 ENCOUNTER — Ambulatory Visit (INDEPENDENT_AMBULATORY_CARE_PROVIDER_SITE_OTHER)
Admission: RE | Admit: 2022-10-02 | Discharge: 2022-10-02 | Disposition: A | Discharge status: Home / Self Care | Payer: PPO | Source: Ambulatory Visit | Attending: Nurse Practitioner | Admitting: Nurse Practitioner

## 2022-10-02 ENCOUNTER — Ambulatory Visit (INDEPENDENT_AMBULATORY_CARE_PROVIDER_SITE_OTHER): Payer: PPO | Admitting: Nurse Practitioner

## 2022-10-02 ENCOUNTER — Encounter: Payer: Self-pay | Admitting: Nurse Practitioner

## 2022-10-02 VITALS — BP 108/60 | HR 71 | Temp 97.6°F | Ht 67.0 in | Wt 233.8 lb

## 2022-10-02 DIAGNOSIS — W19XXXA Unspecified fall, initial encounter: Secondary | ICD-10-CM

## 2022-10-02 DIAGNOSIS — M25461 Effusion, right knee: Secondary | ICD-10-CM

## 2022-10-02 DIAGNOSIS — S51819A Laceration without foreign body of unspecified forearm, initial encounter: Secondary | ICD-10-CM | POA: Diagnosis not present

## 2022-10-02 DIAGNOSIS — S51811D Laceration without foreign body of right forearm, subsequent encounter: Secondary | ICD-10-CM

## 2022-10-02 DIAGNOSIS — M7989 Other specified soft tissue disorders: Secondary | ICD-10-CM | POA: Diagnosis not present

## 2022-10-02 DIAGNOSIS — M25561 Pain in right knee: Secondary | ICD-10-CM

## 2022-10-02 DIAGNOSIS — Z23 Encounter for immunization: Secondary | ICD-10-CM

## 2022-10-02 DIAGNOSIS — M1711 Unilateral primary osteoarthritis, right knee: Secondary | ICD-10-CM | POA: Diagnosis not present

## 2022-10-02 DIAGNOSIS — Z043 Encounter for examination and observation following other accident: Secondary | ICD-10-CM | POA: Diagnosis not present

## 2022-10-02 MED ORDER — CEPHALEXIN 500 MG PO CAPS: 500.0000 mg | ORAL_CAPSULE | Freq: Three times a day (TID) | ORAL | 0 refills | Status: DC

## 2022-10-02 NOTE — Assessment & Plan Note (Signed)
Unwrapped cleaned with saline and gauze.  Redressed with petroleum gauze nonadherent pad Kerlix wrap.  Will like patient to change it daily will do Keflex 500 mg 3 times daily for 7 days update tetanus vaccine today

## 2022-10-02 NOTE — Progress Notes (Signed)
Acute Office Visit  Subjective:     Patient ID: Amber Miles, female    DOB: 30-Dec-1945, 77 y.o.   MRN: [id number]  Chief Complaint  Patient presents with   Elbow Injury    Pt complains of her concerns about right elbow getting infected from her fall on asphalt Friday. Pt stated skin has rippped off. No odor, itchiness.     HPI Patient is in today for fall/elbow injury with a history of htn, hypothyroidism, oa.   States [place] right side. States that she did not hit her head. States that she has been using water, Betadine, hyrdocortisone with gauze. States that the gauze got stuck and caused it to bleed. Cleaned with saline and bacitracin spray and rewrapped.   Review of Systems  Constitutional:  Negative for chills and fever.  Respiratory:  Negative for shortness of breath.   Cardiovascular:  Negative for chest pain.  Skin:  Positive for rash.  Neurological:  Negative for dizziness, tingling, loss of consciousness, weakness and headaches.         Objective:    BP 108/60   Pulse 71   Temp 97.6 F (36.4 C) (Temporal)   Ht 5\' 7"  (1.702 m)   Wt 233 lb 12.8 oz (106.1 kg)   SpO2 94%   BMI 36.62 kg/m    Physical Exam Vitals and nursing note reviewed.  Constitutional:      Appearance: Normal appearance.  Cardiovascular:     Rate and Rhythm: Normal rate and regular rhythm.     Pulses: Normal pulses.     Heart sounds: Normal heart sounds.  Pulmonary:     Effort: Pulmonary effort is normal.     Breath sounds: Normal breath sounds.  Musculoskeletal:        General: Tenderness and signs of injury present.     Right knee: Swelling and ecchymosis present. No deformity or crepitus. Normal range of motion. Tenderness present over the lateral joint line and patellar tendon.  Skin:    General: Skin is warm.     Findings: Bruising and lesion present.  Neurological:     Mental Status: She is alert.      No results  found for any visits on 10/02/22.  The wound is cleansed, debrided of foreign material as much as possible, and dressed. The patient is alerted to watch for any signs of infection (redness, pus, pain, increased swelling or fever) and call if such occurs. Home wound care instructions are provided. Tetanus vaccination status reviewed: Td vaccination indicated and given today.   Patient's wound was dressed using a petroleum dressing followed by nonadherent dressing and then wrapped with a Kerlix gauze and secured with tape.      Assessment & Plan:   Problem List Items Addressed This Visit       Musculoskeletal and Integument   Skin tear of forearm without complication, initial encounter    Unwrapped cleaned with saline and gauze.  Redressed with petroleum gauze nonadherent pad Kerlix wrap.  Will like patient to change it daily will do Keflex 500 mg 3 times daily for 7 days update tetanus vaccine today      Relevant Medications   cephALEXin (KEFLEX) 500 MG capsule   Other Relevant Orders   Tdap vaccine greater than or equal to 7yo IM (Completed)     Other   Fall - Primary    Mechanical fall.  Will  obtain forearm and knee x-rays.  Did clean and redress skin tear update patient's tetanus vaccine      Relevant Orders   DG Forearm Right (Completed)   Tdap vaccine greater than or equal to 7yo IM (Completed)   DG Knee Complete 4 Views Right (Completed)    Meds ordered this encounter  Medications   cephALEXin (KEFLEX) 500 MG capsule    Sig: Take 1 capsule (500 mg total) by mouth 3 (three) times daily.    Dispense:  21 capsule    Refill:  0    Order Specific Question:   Supervising Provider    Answer:   Roxy Manns A [[id number]]    Return in about 4 days (around 10/06/2022) for wound recheck .  Audria Nine, NP

## 2022-10-02 NOTE — Patient Instructions (Signed)
Nice to see you today I will be in touch with the xray results once I have them Follow up with Dr. Milinda Antis later this week Change the dressing daily

## 2022-10-02 NOTE — Assessment & Plan Note (Signed)
Mechanical fall.  Will obtain forearm and knee x-rays.  Did clean and redress skin tear update patient's tetanus vaccine

## 2022-10-05 ENCOUNTER — Encounter: Payer: Self-pay | Admitting: Family Medicine

## 2022-10-05 ENCOUNTER — Telehealth: Payer: Self-pay | Admitting: Family Medicine

## 2022-10-05 ENCOUNTER — Ambulatory Visit (INDEPENDENT_AMBULATORY_CARE_PROVIDER_SITE_OTHER): Payer: PPO | Admitting: Family Medicine

## 2022-10-05 VITALS — BP 132/68 | HR 63 | Temp 97.8°F | Ht 67.0 in | Wt 225.5 lb

## 2022-10-05 DIAGNOSIS — Z79899 Other long term (current) drug therapy: Secondary | ICD-10-CM | POA: Insufficient documentation

## 2022-10-05 DIAGNOSIS — R7303 Prediabetes: Secondary | ICD-10-CM

## 2022-10-05 DIAGNOSIS — W19XXXA Unspecified fall, initial encounter: Secondary | ICD-10-CM

## 2022-10-05 DIAGNOSIS — I10 Essential (primary) hypertension: Secondary | ICD-10-CM

## 2022-10-05 DIAGNOSIS — S51811D Laceration without foreign body of right forearm, subsequent encounter: Secondary | ICD-10-CM | POA: Diagnosis not present

## 2022-10-05 DIAGNOSIS — E039 Hypothyroidism, unspecified: Secondary | ICD-10-CM

## 2022-10-05 DIAGNOSIS — E785 Hyperlipidemia, unspecified: Secondary | ICD-10-CM

## 2022-10-05 NOTE — Progress Notes (Signed)
Subjective:    Patient ID: Amber Miles, female    DOB: [birth year], 77 y.o.   MRN: [id number]  HPI  Wt Readings from Last 3 Encounters:  10/05/22 225 lb 8 oz (102.3 kg)  10/02/22 233 lb 12.8 oz (106.1 kg)  09/04/22 237 lb 6 oz (107.7 kg)   35.32 kg/m  Vitals:   10/05/22 1127  BP: 132/68  Pulse: 63  Temp: 97.8 F (36.6 C)  SpO2: 95%   Pt presents for a wound re check   Saw Audria Nine on 9/16 for wound after a fall on cement Abrasion /skin tear right arm   Tripped when she was at the bank/caught her foot and feel on right arm  No other injuries  No fractures   Wrapped with petroleum gauze and kerlix Treatment with kefles 500 mg tid for 7 d   Updated Tdap   Walks with a cane  Balance is a little worse with age Needs handicapped parking form    Feels fine today Tolerates keflex well    Patient Active Problem List   Diagnosis Date Noted   Skin tear of forearm without complication 10/02/2022   Decreased GFR 06/19/2022   Hoarseness of voice 05/08/2022   Upper respiratory infection, acute 10/03/2021   Oral candida 10/03/2021   Suspected COVID-19 virus infection 10/03/2021   Routine general medical examination at a health care facility 07/29/2019   Genetic testing 01/02/2018   Family history of breast cancer    Family history of genetic disease carrier    Meningioma (HCC) 07/17/2017   Fall 01/04/2017   Vasomotor symptoms due to menopause 06/26/2016   Atrophic vaginitis 09/01/2015   Pedal edema 05/27/2014   Estrogen deficiency 02/21/2013   Encounter for Medicare annual wellness exam 02/21/2013   Low back pain potentially associated with radiculopathy 10/11/2012   ALLERGIC RHINITIS 01/28/2010   Prediabetes 07/30/2009   ANXIETY 11/30/2008   Hyperlipidemia 04/13/2008   Hypothyroidism 01/07/2008   HYPERTENSION, BENIGN 01/07/2008   OSTEOARTHRITIS, GENERALIZED 01/07/2008   Past Medical History:  Diagnosis Date   Allergy    allergic rhinitis   Anxiety     Arthritis    OA knee replacement   Cataract    Degenerative disc disease, thoracic    Family history of breast cancer    Family history of genetic disease carrier    paternal cousin has NBN mutation   GERD (gastroesophageal reflux disease)    Hyperlipidemia    Hypertension    Hypothyroid    Reactive airways dysfunction syndrome (HCC)    Skin lesions, generalized    squamous cell skin lesions and basal cell skin lesions   Past Surgical History:  Procedure Laterality Date   ABDOMINAL HYSTERECTOMY  1990s   hyst with oophrectomy for B9lesion/cyst and prolapse   BREAST BIOPSY Bilateral    benign   JOINT REPLACEMENT  05/2009   left total knee replacement   recocele     and cystocele repair   TONSILLECTOMY     tsa     Social History   Tobacco Use   Smoking status: Never   Smokeless tobacco: Never  Vaping Use   Vaping status: Never Used  Substance Use Topics   Alcohol use: No    Alcohol/week: 0.0 standard drinks of alcohol   Drug use: No   Family History  Problem Relation Age of Onset   Hypertension Mother    Stroke Mother    Depression Mother    Alzheimer's disease Mother  Heart disease Father    Cerebral palsy Father    Hypertension Sister    Breast cancer Cousin 82       NBN mutation   Melanoma Maternal Uncle        dx >50   Allergies  Allergen Reactions   Neosporin [Neomycin-Polymyxin-Gramicidin] Other (See Comments)    blisters   Tape Swelling    Itching and redness Pt said severe reaction to surgical tape.? latex   Current Outpatient Medications on File Prior to Visit  Medication Sig Dispense Refill   acyclovir (ZOVIRAX) 800 MG tablet TAKE 1 TABLET BY MOUTH 3 TIMES DAILY FOR 3 DAYS FLARES  2   albuterol (VENTOLIN HFA) 108 (90 Base) MCG/ACT inhaler INHALE 1 TO 2 PUFFS EVERY 4 TO 6 HOURS AS NEEDED FOR WHEEZE OR COUGH 8.5 each 1   ARNUITY ELLIPTA 200 MCG/ACT AEPB Inhale 1 puff into the lungs daily.     azelastine (ASTELIN) 0.1 % nasal spray Place 1  spray into both nostrils 2 (two) times daily.     cephALEXin (KEFLEX) 500 MG capsule Take 1 capsule (500 mg total) by mouth 3 (three) times daily. 21 capsule 0   cetirizine (ZYRTEC) 5 MG tablet Take 5 mg by mouth daily.     Docusate Calcium (STOOL SOFTENER PO) Take 1 tablet by mouth daily as needed.     esomeprazole (NEXIUM) 40 MG capsule TAKE 1 CAPSULE BY MOUTH EVERY DAY BEFORE BREAKFAST 90 capsule 1   estradiol (VIVELLE-DOT) 0.0375 MG/24HR PLACE 1 PATCH ONTO THE SKIN 2 TIMES A WEEK. 24 patch 1   furosemide (LASIX) 20 MG tablet TAKE 1/2 TABLET BY MOUTH DAILY 45 tablet 1   GLUCOSAMINE-CHONDROITIN-VIT D3 PO Take 1 capsule by mouth 2 (two) times daily.     lisinopril (ZESTRIL) 5 MG tablet TAKE 1 TABLET BY MOUTH EVERY DAY 90 tablet 1   montelukast (SINGULAIR) 10 MG tablet Take 10 mg by mouth daily.      Multiple Vitamin (MULTIVITAMIN) tablet Take 1 tablet by mouth daily.       naproxen sodium (ALEVE) 220 MG tablet Take 220 mg by mouth every 12 (twelve) hours as needed.     potassium chloride SA (KLOR-CON M20) 20 MEQ tablet TAKE 1 TABLET BY MOUTH EVERY DAY 90 tablet 3   SYNTHROID 112 MCG tablet Take 112 mcg by mouth daily.  11   tiZANidine (ZANAFLEX) 4 MG tablet TAKE 1 TABLET BY MOUTH EVERY 6 HOURS AS NEEDED 360 tablet 0   traMADol (ULTRAM) 50 MG tablet TAKE 1-2 TABLETS BY MOUTH EVERY 8 HOURS AS NEEDED FOR SEVERE(SHOULDER PAIN). CAUTION OF SEDATION 30 tablet 0   triamterene-hydrochlorothiazide (MAXZIDE-25) 37.5-25 MG tablet Take 1 tablet by mouth daily. 1 tablet 0   verapamil (CALAN-SR) 240 MG CR tablet TAKE 1 TABLET (240 MG TOTAL) BY MOUTH 2 (TWO) TIMES DAILY. 180 tablet 2   No current facility-administered medications on file prior to visit.    Review of Systems  Constitutional:  Negative for activity change, appetite change, fatigue, fever and unexpected weight change.  HENT:  Negative for congestion, ear pain, rhinorrhea, sinus pressure and sore throat.   Eyes:  Negative for pain, redness  and visual disturbance.  Respiratory:  Negative for cough, shortness of breath and wheezing.   Cardiovascular:  Negative for chest pain and palpitations.  Gastrointestinal:  Negative for abdominal pain, blood in stool, constipation and diarrhea.  Endocrine: Negative for polydipsia and polyuria.  Genitourinary:  Negative for dysuria, frequency and  urgency.  Musculoskeletal:  Negative for arthralgias, back pain and myalgias.  Skin:  Positive for wound. Negative for pallor and rash.       Wound is sore but tolerable  No bleeding   Allergic/Immunologic: Negative for environmental allergies.  Neurological:  Negative for dizziness, syncope and headaches.  Hematological:  Negative for adenopathy. Does not bruise/bleed easily.  Psychiatric/Behavioral:  Negative for decreased concentration and dysphoric mood. The patient is not nervous/anxious.        Objective:   Physical Exam Constitutional:      General: She is not in acute distress.    Appearance: Normal appearance. She is obese. She is not ill-appearing.  HENT:     Head: Normocephalic and atraumatic.  Cardiovascular:     Rate and Rhythm: Normal rate and regular rhythm.  Pulmonary:     Effort: Pulmonary effort is normal. No respiratory distress.  Musculoskeletal:        General: No swelling or deformity.  Skin:    Coloration: Skin is not pale.     Comments: Lateral right forearm with large area of skin tear  Healing  Still some wet areas and flaps of skin noted  No erythema or swelling Mildly tender   Re dressed with  Triple antibiotic oint  Vaseline gauze  Non stick pads Kerlex  Pt tolerated well     Neurological:     General: No focal deficit present.     Mental Status: She is alert.     Cranial Nerves: No cranial nerve deficit.  Psychiatric:        Mood and Affect: Mood normal.           Assessment & Plan:   Problem List Items Addressed This Visit       Musculoskeletal and Integument   Skin tear of  forearm without complication - Primary    This looks to be healing well with no signs and symptoms of infection Had to use saline to get dressing off today Encouraged to use aquaphor in addition to vaseline gauze to prevent sticking Re dressed today with triple antibiotic oint, vaseline guaze then non stick pad and kerlex Instructed to keep clean with soap and water (do not submerge)   Update if not starting to improve in a week or if worsening  Call back and Er precautions noted in detail today   Will finish keflex Follow up for annual exam in a week         Other   Fall    Discussed fall prevention today  Pt does not thinks any medicine caused her to fall / she tripped   Will review further at her appointment next wk

## 2022-10-05 NOTE — Patient Instructions (Addendum)
Keep dressing wound  Layer of aquaphor Then vaseline gauze  Then non stick pad / then fluffy gauze   Keep clean with soap and water  Looks good !   Once the wet areas are dry (scabs) then you don't have to dress   Watch for redness/ pain/ swelling  or fever   Finish the antibiotic

## 2022-10-05 NOTE — Assessment & Plan Note (Signed)
This looks to be healing well with no signs and symptoms of infection Had to use saline to get dressing off today Encouraged to use aquaphor in addition to vaseline gauze to prevent sticking Re dressed today with triple antibiotic oint, vaseline guaze then non stick pad and kerlex Instructed to keep clean with soap and water (do not submerge)   Update if not starting to improve in a week or if worsening  Call back and Er precautions noted in detail today   Will finish keflex Follow up for annual exam in a week

## 2022-10-05 NOTE — Telephone Encounter (Signed)
-----   Message from Amber Miles sent at 09/22/2022  2:57 PM EDT ----- Regarding: Labs Fri. 10/06/22 Hello,   Patient has a lab appointment on Fri. 10/06/22. Can we get lab orders please.   Thanks

## 2022-10-05 NOTE — Assessment & Plan Note (Signed)
Discussed fall prevention today  Pt does not thinks any medicine caused her to fall / she tripped   Will review further at her appointment next wk

## 2022-10-06 ENCOUNTER — Other Ambulatory Visit (INDEPENDENT_AMBULATORY_CARE_PROVIDER_SITE_OTHER): Payer: PPO

## 2022-10-06 DIAGNOSIS — R7303 Prediabetes: Secondary | ICD-10-CM

## 2022-10-06 DIAGNOSIS — E039 Hypothyroidism, unspecified: Secondary | ICD-10-CM | POA: Diagnosis not present

## 2022-10-06 DIAGNOSIS — I10 Essential (primary) hypertension: Secondary | ICD-10-CM | POA: Diagnosis not present

## 2022-10-06 DIAGNOSIS — Z79899 Other long term (current) drug therapy: Secondary | ICD-10-CM | POA: Diagnosis not present

## 2022-10-06 DIAGNOSIS — E785 Hyperlipidemia, unspecified: Secondary | ICD-10-CM | POA: Diagnosis not present

## 2022-10-06 LAB — COMPREHENSIVE METABOLIC PANEL
ALT: 16 U/L (ref 0–35)
AST: 18 U/L (ref 0–37)
Albumin: 3.9 g/dL (ref 3.5–5.2)
Alkaline Phosphatase: 76 U/L (ref 39–117)
BUN: 15 mg/dL (ref 6–23)
CO2: 26 mEq/L (ref 19–32)
Calcium: 10 mg/dL (ref 8.4–10.5)
Chloride: 96 mEq/L (ref 96–112)
Creatinine, Ser: 1.12 mg/dL (ref 0.40–1.20)
GFR: 47.35 mL/min — ABNORMAL LOW (ref >60.00)
Glucose, Bld: 103 mg/dL — ABNORMAL HIGH (ref 70–99)
Potassium: 3.8 mEq/L (ref 3.5–5.1)
Sodium: 132 mEq/L — ABNORMAL LOW (ref 135–145)
Total Bilirubin: 0.5 mg/dL (ref 0.2–1.2)
Total Protein: 7.2 g/dL (ref 6.0–8.3)

## 2022-10-06 LAB — LIPID PANEL
Cholesterol: 158 mg/dL (ref 0–200)
HDL: 49 mg/dL
LDL Cholesterol: 86 mg/dL (ref 0–99)
NonHDL: 109.45
Total CHOL/HDL Ratio: 3
Triglycerides: 119 mg/dL (ref 0.0–149.0)
VLDL: 23.8 mg/dL (ref 0.0–40.0)

## 2022-10-06 LAB — CBC WITH DIFFERENTIAL/PLATELET
Basophils Absolute: 0 10*3/uL (ref 0.0–0.1)
Basophils Relative: 0.5 % (ref 0.0–3.0)
Eosinophils Absolute: 0.2 10*3/uL (ref 0.0–0.7)
Eosinophils Relative: 2.2 % (ref 0.0–5.0)
HCT: 41.3 % (ref 36.0–46.0)
Hemoglobin: 13.5 g/dL (ref 12.0–15.0)
Lymphocytes Relative: 23.6 % (ref 12.0–46.0)
Lymphs Abs: 1.6 10*3/uL (ref 0.7–4.0)
MCHC: 32.7 g/dL (ref 30.0–36.0)
MCV: 86.8 fl (ref 78.0–100.0)
Monocytes Absolute: 0.8 10*3/uL (ref 0.1–1.0)
Monocytes Relative: 11.4 % (ref 3.0–12.0)
Neutro Abs: 4.2 10*3/uL (ref 1.4–7.7)
Neutrophils Relative %: 62.3 % (ref 43.0–77.0)
Platelets: 515 10*3/uL — ABNORMAL HIGH (ref 150.0–400.0)
RBC: 4.75 Mil/uL (ref 3.87–5.11)
RDW: 15 % (ref 11.5–15.5)
WBC: 6.8 10*3/uL (ref 4.0–10.5)

## 2022-10-06 LAB — VITAMIN B12: Vitamin B-12: 888 pg/mL (ref 211–911)

## 2022-10-06 LAB — VITAMIN D 25 HYDROXY (VIT D DEFICIENCY, FRACTURES): VITD: 74.49 ng/mL (ref 30.00–100.00)

## 2022-10-06 LAB — TSH: TSH: 7.64 u[IU]/mL — ABNORMAL HIGH (ref 0.35–5.50)

## 2022-10-06 LAB — HEMOGLOBIN A1C: Hgb A1c MFr Bld: 5.7 % (ref 4.6–6.5)

## 2022-10-12 ENCOUNTER — Other Ambulatory Visit: Payer: Self-pay | Admitting: Family Medicine

## 2022-10-13 ENCOUNTER — Ambulatory Visit (INDEPENDENT_AMBULATORY_CARE_PROVIDER_SITE_OTHER): Payer: PPO | Admitting: Family Medicine

## 2022-10-13 VITALS — BP 136/68 | HR 60 | Temp 97.9°F | Ht 67.0 in | Wt 225.0 lb

## 2022-10-13 DIAGNOSIS — R7303 Prediabetes: Secondary | ICD-10-CM | POA: Diagnosis not present

## 2022-10-13 DIAGNOSIS — E042 Nontoxic multinodular goiter: Secondary | ICD-10-CM | POA: Diagnosis not present

## 2022-10-13 DIAGNOSIS — R7989 Other specified abnormal findings of blood chemistry: Secondary | ICD-10-CM

## 2022-10-13 DIAGNOSIS — E039 Hypothyroidism, unspecified: Secondary | ICD-10-CM

## 2022-10-13 DIAGNOSIS — E785 Hyperlipidemia, unspecified: Secondary | ICD-10-CM | POA: Diagnosis not present

## 2022-10-13 DIAGNOSIS — Z79890 Hormone replacement therapy (postmenopausal): Secondary | ICD-10-CM | POA: Insufficient documentation

## 2022-10-13 DIAGNOSIS — R490 Dysphonia: Secondary | ICD-10-CM

## 2022-10-13 DIAGNOSIS — Z0000 Encounter for general adult medical examination without abnormal findings: Secondary | ICD-10-CM | POA: Diagnosis not present

## 2022-10-13 DIAGNOSIS — R600 Localized edema: Secondary | ICD-10-CM

## 2022-10-13 DIAGNOSIS — Z79899 Other long term (current) drug therapy: Secondary | ICD-10-CM

## 2022-10-13 DIAGNOSIS — R944 Abnormal results of kidney function studies: Secondary | ICD-10-CM | POA: Diagnosis not present

## 2022-10-13 DIAGNOSIS — I10 Essential (primary) hypertension: Secondary | ICD-10-CM | POA: Diagnosis not present

## 2022-10-13 DIAGNOSIS — D329 Benign neoplasm of meninges, unspecified: Secondary | ICD-10-CM

## 2022-10-13 DIAGNOSIS — S51811D Laceration without foreign body of right forearm, subsequent encounter: Secondary | ICD-10-CM

## 2022-10-13 NOTE — Assessment & Plan Note (Signed)
Pt continues this for pelvic floor issues Per pt was told to take this long term  Discussed risks (increase risk possibly in breast cancer and blood clots)-voiced understanding  Wants to continue  Vivelle dot 0.0375 mg

## 2022-10-13 NOTE — Progress Notes (Unsigned)
Subjective:    Patient ID: Amber Miles, female    DOB: Feb 08, 1945, 77 y.o.   MRN: [id number]  HPI  Here for health maintenance exam and to review chronic medical problems   Wt Readings from Last 3 Encounters:  10/13/22 225 lb (102.1 kg)  10/05/22 225 lb 8 oz (102.3 kg)  10/02/22 233 lb 12.8 oz (106.1 kg)   35.24 kg/m  Vitals:   10/13/22 1425  BP: 136/68  Pulse: 60  Temp: 97.9 F (36.6 C)  SpO2: 97%    Immunization History  Administered Date(s) Administered   Fluad Quad(high Dose 65+) 10/09/2019, 10/05/2021   Influenza Split 10/18/2010   Influenza Whole 11/30/2006, 10/28/2008, 10/27/2009   Influenza, High Dose Seasonal PF 11/11/2015, 10/05/2016, 10/18/2017, 11/11/2018, 10/29/2020   Influenza-Unspecified 10/07/2012, 08/30/2013, 10/01/2014   PFIZER Comirnaty(Gray Top)Covid-19 Tri-Sucrose Vaccine 06/09/2020   PFIZER(Purple Top)SARS-COV-2 Vaccination 02/04/2019, 02/24/2019, 10/20/2019   Pfizer Covid-19 Vaccine Bivalent Booster 80yrs & up 11/02/2020   Pfizer(Comirnaty)Fall Seasonal Vaccine 12 years and older 11/14/2021   Pneumococcal Conjugate-13 05/27/2014   Pneumococcal Polysaccharide-23 12/04/2011, 10/07/2015, 10/05/2016, 10/04/2017, 05/15/2019, 05/06/2020, 05/12/2021   Respiratory Syncytial Virus Vaccine,Recomb Aduvanted(Arexvy) 11/28/2021   Td 12/04/2011   Tdap 10/02/2022   Zoster Recombinant(Shingrix) 08/06/2019, 10/09/2019   Zoster, Live 10/05/2010    There are no preventive care reminders to display for this patient.  Flu shot - planning to get that in oct with the flu shot next month at pharmacy   Mammogram 12/05/21   has order in for next one  Self breast exam - no lumps   Gyn health HRT is long term for pelvic floor problems    Colon cancer screening -colonoscopy 09/2020 with 5 y recall if applicable /able   Bone health  Dexa  05/2020 - bmd in normal range  Falls-one /no injuries  Fractures- none / never  Supplements   Last vitamin D Lab Results   Component Value Date   VD25OH 74.49 10/06/2022  This is good   Exercise : doing regular PT exercise for her spine and legs  Keeping her strong Does it for up to an hour per day   Utd dermatology care    Mood    10/13/2022    2:31 PM 10/02/2022   10:48 AM 08/08/2022   11:24 AM 06/19/2022    3:03 PM 05/22/2022    3:37 PM  Depression screen PHQ 2/9  Decreased Interest 0 0 0 0 0  Down, Depressed, Hopeless 0 0 0 0 0  PHQ - 2 Score 0 0 0 0 0  Altered sleeping 0 0     Tired, decreased energy 0 0     Change in appetite 0 0     Feeling bad or failure about yourself  0 0     Trouble concentrating 0 0     Moving slowly or fidgety/restless 0 0     Suicidal thoughts 0 0     PHQ-9 Score 0 0     Difficult doing work/chores Not difficult at all Not difficult at all      Watching skin tear of forearm  Slowly getting better    HTN bp is stable today  No cp or palpitations or headaches or edema  No side effects to medicines  BP Readings from Last 3 Encounters:  10/13/22 136/68  10/05/22 132/68  10/02/22 108/60    Recent  lower dose of triam hct- 37.5-25 mg one pill daily (was 2) - this was dec due to  reduced GFR   10 mg lasix (for pedal edema ) Verapamil SR 240 mg daily  Lisinopril 5 mg daily   Lab Results  Component Value Date   NA 132 (L) 10/06/2022   K 3.8 10/06/2022   CO2 26 10/06/2022   GLUCOSE 103 (H) 10/06/2022   BUN 15 10/06/2022   CREATININE 1.12 10/06/2022   CALCIUM 10.0 10/06/2022   GFR 47.35 (L) 10/06/2022   GFRNONAA 55 (L) 08/26/2012  GFR is 47.35 -down from 3 mo ago (it had initially improved after decrease of the maxzide)  Did have cystitis in August treatment by Dr Amber Miles , was treatment with septra (was klebsiella)   Does not eat a lot of salty food    Platelets are elevated Lab Results  Component Value Date   WBC 6.8 10/06/2022   HGB 13.5 10/06/2022   HCT 41.3 10/06/2022   MCV 86.8 10/06/2022   PLT 515.0 (H) 10/06/2022   This may be  inflammatory- in response to fall and hematoma/ skin tear Amber Miles    Amber Miles for arthritis - right knee  Ankles  Sees ortho at [facility] in back  Tylenol does not help     Hypothyroidism and thyroid nodules (radiation in past)   Pt has no clinical changes No change in energy level/ hair or skin/ edema and no tremor Lab Results  Component Value Date   TSH 7.64 (H) 10/06/2022    Levothyroxine 112 mcg -takes it correctly  No missed doses  Was seeing endocrinology  No clinical changes No changes in neck   Gets thyroid US yearly   GERD with nexium 40 mg daily  Lab Results  Component Value Date   VITAMINB12 888 10/06/2022     Hyperlipidemia Lab Results  Component Value Date   CHOL 158 10/06/2022   CHOL 139 10/03/2021   CHOL 186 08/04/2020   Lab Results  Component Value Date   HDL 49.00 10/06/2022   HDL 43.50 10/03/2021   HDL 46.60 08/04/2020   Lab Results  Component Value Date   LDLCALC 86 10/06/2022   LDLCALC 80 10/03/2021   LDLCALC 83 07/22/2019   Lab Results  Component Value Date   TRIG 119.0 10/06/2022   TRIG 78.0 10/03/2021   TRIG 255.0 (H) 08/04/2020   Lab Results  Component Value Date   CHOLHDL 3 10/06/2022   CHOLHDL 3 10/03/2021   CHOLHDL 4 08/04/2020   Lab Results  Component Value Date   LDLDIRECT 114.0 08/04/2020   LDLDIRECT 105.0 06/15/2015   Diet controlled  Stable / well controlled    Prediabetes Lab Results  Component Value Date   HGBA1C 5.7 10/06/2022   This is down from 6.0   Is eating better  Eating less in general   Does like oreos - eating less of those     Patient Active Problem List   Diagnosis Date Noted   Hormone replacement therapy (HRT) 10/13/2022   Multiple thyroid nodules 10/13/2022   Current use of proton pump inhibitor 10/05/2022   Skin tear of forearm without complication 10/02/2022   Decreased GFR 06/19/2022   Hoarseness of voice 05/08/2022   Routine general medical examination at a  health care facility 07/29/2019   Genetic testing 01/02/2018   Family history of breast cancer    Family history of genetic disease carrier    Meningioma (HCC) 07/17/2017   Fall 01/04/2017   Atrophic vaginitis 09/01/2015   Pedal edema 05/27/2014   Estrogen deficiency 02/21/2013  Encounter for Medicare annual wellness exam 02/21/2013   Low back pain potentially associated with radiculopathy 10/11/2012   ALLERGIC RHINITIS 01/28/2010   Prediabetes 07/30/2009   ANXIETY 11/30/2008   Hyperlipidemia 04/13/2008   Hypothyroidism 01/07/2008   HYPERTENSION, BENIGN 01/07/2008   OSTEOARTHRITIS, GENERALIZED 01/07/2008   Past Medical History:  Diagnosis Date   Allergy    allergic rhinitis   Anxiety    Arthritis    OA knee replacement   Cataract    Degenerative disc disease, thoracic    Family history of breast cancer    Family history of genetic disease carrier    paternal cousin has NBN mutation   GERD (gastroesophageal reflux disease)    Hyperlipidemia    Hypertension    Hypothyroid    Reactive airways dysfunction syndrome (HCC)    Skin lesions, generalized    squamous cell skin lesions and basal cell skin lesions   Past Surgical History:  Procedure Laterality Date   ABDOMINAL HYSTERECTOMY  1990s   hyst with oophrectomy for B9lesion/cyst and prolapse   BREAST BIOPSY Bilateral    benign   JOINT REPLACEMENT  05/2009   left total knee replacement   recocele     and cystocele repair   TONSILLECTOMY     tsa     Social History   Tobacco Use   Smoking status: Never   Smokeless tobacco: Never  Vaping Use   Vaping status: Never Used  Substance Use Topics   Alcohol use: No    Alcohol/week: 0.0 standard drinks of alcohol   Drug use: No   Family History  Problem Relation Age of Onset   Hypertension Mother    Stroke Mother    Depression Mother    Alzheimer's disease Mother    Heart disease Father    Cerebral palsy Father    Hypertension Sister    Breast cancer Cousin  37       NBN mutation   Melanoma Maternal Uncle        dx >50   Allergies  Allergen Reactions   Neosporin [Neomycin-Polymyxin-Gramicidin] Other (See Comments)    blisters   Tape Swelling    Itching and redness Pt said severe reaction to surgical tape.? latex   Current Outpatient Medications on File Prior to Visit  Medication Sig Dispense Refill   acyclovir (ZOVIRAX) 800 MG tablet TAKE 1 TABLET BY MOUTH 3 TIMES DAILY FOR 3 DAYS FLARES  2   albuterol (VENTOLIN HFA) 108 (90 Base) MCG/ACT inhaler INHALE 1 TO 2 PUFFS EVERY 4 TO 6 HOURS AS NEEDED FOR WHEEZE OR COUGH 8.5 each 1   ARNUITY ELLIPTA 200 MCG/ACT AEPB Inhale 1 puff into the lungs daily.     azelastine (ASTELIN) 0.1 % nasal spray Place 1 spray into both nostrils 2 (two) times daily.     cetirizine (ZYRTEC) 5 MG tablet Take 5 mg by mouth daily.     Docusate Calcium (STOOL SOFTENER PO) Take 1 tablet by mouth daily as needed.     esomeprazole (NEXIUM) 40 MG capsule TAKE 1 CAPSULE BY MOUTH EVERY DAY BEFORE BREAKFAST 90 capsule 1   estradiol (VIVELLE-DOT) 0.0375 MG/24HR PLACE 1 PATCH ONTO THE SKIN 2 TIMES A WEEK. 24 patch 1   furosemide (LASIX) 20 MG tablet TAKE 1/2 TABLET BY MOUTH DAILY 45 tablet 1   GLUCOSAMINE-CHONDROITIN-VIT D3 PO Take 1 capsule by mouth 2 (two) times daily.     lisinopril (ZESTRIL) 5 MG tablet TAKE 1 TABLET BY MOUTH EVERY DAY  90 tablet 1   montelukast (SINGULAIR) 10 MG tablet Take 10 mg by mouth daily.      Multiple Vitamin (MULTIVITAMIN) tablet Take 1 tablet by mouth daily.       potassium chloride SA (KLOR-CON M20) 20 MEQ tablet TAKE 1 TABLET BY MOUTH EVERY DAY 90 tablet 3   SYNTHROID 112 MCG tablet Take 112 mcg by mouth daily.  11   tiZANidine (ZANAFLEX) 4 MG tablet TAKE 1 TABLET BY MOUTH EVERY 6 HOURS AS NEEDED 360 tablet 0   traMADol (ULTRAM) 50 MG tablet TAKE 1-2 TABLETS BY MOUTH EVERY 8 HOURS AS NEEDED FOR SEVERE(SHOULDER PAIN). CAUTION OF SEDATION 30 tablet 0   triamterene-hydrochlorothiazide (MAXZIDE-25)  37.5-25 MG tablet Take 1 tablet by mouth daily. 1 tablet 0   verapamil (CALAN-SR) 240 MG CR tablet TAKE 1 TABLET (240 MG TOTAL) BY MOUTH 2 (TWO) TIMES DAILY. 180 tablet 2   No current facility-administered medications on file prior to visit.    Review of Systems  Constitutional:  Negative for activity change, appetite change, fatigue, fever and unexpected weight change.  HENT:  Negative for congestion, ear pain, rhinorrhea, sinus pressure and sore throat.   Eyes:  Negative for pain, redness and visual disturbance.  Respiratory:  Negative for cough, shortness of breath and wheezing.   Cardiovascular:  Negative for chest pain and palpitations.  Gastrointestinal:  Negative for abdominal pain, blood in stool, constipation and diarrhea.  Endocrine: Negative for polydipsia and polyuria.  Genitourinary:  Negative for dysuria, frequency and urgency.  Musculoskeletal:  Positive for back pain. Negative for arthralgias and myalgias.  Skin:  Positive for wound. Negative for pallor and rash.  Allergic/Immunologic: Negative for environmental allergies.  Neurological:  Negative for dizziness, syncope and headaches.  Hematological:  Negative for adenopathy. Does not bruise/bleed easily.  Psychiatric/Behavioral:  Negative for decreased concentration and dysphoric mood. The patient is not nervous/anxious.        Objective:   Physical Exam Constitutional:      General: She is not in acute distress.    Appearance: Normal appearance. She is well-developed. She is obese. She is not ill-appearing or diaphoretic.  HENT:     Head: Normocephalic and atraumatic.     Right Ear: Tympanic membrane, ear canal and external ear normal.     Left Ear: Tympanic membrane, ear canal and external ear normal.     Nose: Nose normal. No congestion.     Mouth/Throat:     Mouth: Mucous membranes are moist.     Pharynx: Oropharynx is clear. No posterior oropharyngeal erythema.  Eyes:     General: No scleral icterus.     Extraocular Movements: Extraocular movements intact.     Conjunctiva/sclera: Conjunctivae normal.     Pupils: Pupils are equal, round, and reactive to light.  Neck:     Thyroid: No thyromegaly.     Vascular: No carotid bruit or JVD.  Cardiovascular:     Rate and Rhythm: Normal rate and regular rhythm.     Pulses: Normal pulses.     Heart sounds: Normal heart sounds.     No gallop.  Pulmonary:     Effort: Pulmonary effort is normal. No respiratory distress.     Breath sounds: Normal breath sounds. No wheezing.     Comments: Good air exch Chest:     Chest wall: No tenderness.  Abdominal:     General: Bowel sounds are normal. There is no distension or abdominal bruit.     Palpations:  Abdomen is soft. There is no mass.     Tenderness: There is no abdominal tenderness.     Hernia: No hernia is present.  Genitourinary:    Comments: Breast exam: No mass, nodules, thickening, tenderness, bulging, retraction, inflamation, nipple discharge or skin changes noted.  No axillary or clavicular LA.     Musculoskeletal:        General: No tenderness. Normal range of motion.     Cervical back: Normal range of motion and neck supple. No rigidity. No muscular tenderness.     Right lower leg: No edema.     Left lower leg: No edema.     Comments: No kyphosis   Lymphadenopathy:     Cervical: No cervical adenopathy.  Skin:    General: Skin is warm and dry.     Coloration: Skin is not pale.     Findings: No erythema or rash.     Comments: Solar lentigines diffusely   Wound on right arm has less open areas and no signs of infection  Part of the skin has started to heal in place  Mildly tender /overall improved   Neurological:     Mental Status: She is alert. Mental status is at baseline.     Cranial Nerves: No cranial nerve deficit.     Motor: No abnormal muscle tone.     Coordination: Coordination normal.     Gait: Gait normal.     Deep Tendon Reflexes: Reflexes are normal and symmetric.  Reflexes normal.  Psychiatric:        Mood and Affect: Mood normal.        Cognition and Memory: Cognition and memory normal.           Assessment & Plan:   Problem List Items Addressed This Visit       Cardiovascular and Mediastinum   HYPERTENSION, BENIGN    bp in fair control at this time  BP Readings from Last 1 Encounters:  10/13/22 136/68   No changes needed Triam ct 37.5-25 one a day (used to take 2)  Lasix 10 mg daily for pedal edema  Verapamil SR 240 mg daily  Lisinopril 5 mg daily  Most recent labs reviewed  Disc lifstyle change with low sodium diet and exercise   Watching sodium (132)          Endocrine   Hypothyroidism    Lab Results  Component Value Date   TSH 7.64 (H) 10/06/2022   Taking levothyroxine 112 mcg  Needs referral to new endocrinologist since Dr Carles Collet retired  Will do referral  No clinical changes  Does not want to change dose w/o seeing endo  Has thyroid nodules that were monitored by [place]       Multiple thyroid nodules    Pt needs ref to new endocrinologist Was monitoring by [place]         Nervous and Auditory   Meningioma (HCC)    No clinical symptoms         Musculoskeletal and Integument   Skin tear of forearm without complication     Other   Current use of proton pump inhibitor    Lab Results  Component Value Date   VITAMINB12 888 10/06/2022    Last vitamin D Lab Results  Component Value Date   VD25OH 74.49 10/06/2022         Decreased GFR    GFR is 47.35 Down from 3 mo ago but had uti with bactrim in  August  Will work on fluid intake but need to be cautious due to hyponatremia  Avoid nsaids Watch for signs and symptoms of uti       Hormone replacement therapy (HRT)    Pt continues this for pelvic floor issues Per pt was told to take this long term   Vivelle dot 0.0375 mg       Hyperlipidemia    Disc goals for lipids and reasons to control them Rev last labs with pt Rev low sat fat diet in  detail  Diet controlled / stable LDL of 86       Pedal edema    Lasix 10 mg and maxzide 37.5-25 mg are helping  Encouraged low sodium diet /less processed foods Supp hose if needed      Routine general medical examination at a health care facility - Primary    Reviewed health habits including diet and exercise and skin cancer prevention Reviewed appropriate screening tests for age  Also reviewed health mt list, fam hx and immunization status , as well as social and family history   See HPI Labs reviewed and ordered Plans to get flu shot in oct at pharmacy Mammogram utd 11/2021- has next one scheduled  Colonoscopy 09/2020 with 5 y recall if able  Dexa 05/2020  Discussed fall prevention, supplements and exercise for bone density  D levels is normal  Utd derm care and screening  PHQ 0

## 2022-10-13 NOTE — Patient Instructions (Addendum)
Your sodium level is a bit low from the diuretics , we will keep watching it  Your kidney numbers are not as good, this may be from the uti and medication in August  Your platelet count is higher than usual= may be due to your trauma and wound   Get your flu shot and covid shot at the pharmacy   I want to re check non fasting labs in a month to re check the above   I put the referral in for endocrinology  Please let us know if you don't hear in 1-2 weeks   Stop the aleve  It is too hard on the kidney  Continue follow up with orthopedics for each of your joint complaints to work towards control of each issue   Keep dressing the wound daily as it heals

## 2022-10-15 ENCOUNTER — Encounter: Payer: Self-pay | Admitting: Family Medicine

## 2022-10-15 DIAGNOSIS — R7989 Other specified abnormal findings of blood chemistry: Secondary | ICD-10-CM | POA: Insufficient documentation

## 2022-10-15 NOTE — Assessment & Plan Note (Signed)
Pt needs ref to new endocrinologist Was monitoring by [place]

## 2022-10-15 NOTE — Assessment & Plan Note (Signed)
No clinical symptoms

## 2022-10-15 NOTE — Assessment & Plan Note (Signed)
Lab Results  Component Value Date   VITAMINB12 888 10/06/2022    Last vitamin D Lab Results  Component Value Date   VD25OH 74.49 10/06/2022

## 2022-10-15 NOTE — Assessment & Plan Note (Signed)
Disc goals for lipids and reasons to control them Rev last labs with pt Rev low sat fat diet in detail  Diet controlled / stable LDL of 86

## 2022-10-15 NOTE — Assessment & Plan Note (Signed)
This is looking better  Less open areas Re dressed arm with antibiotic ointment/ vaseline gauze and fluffy wrap (also sleeve she brought from home to keep in place)  Discussed wound care-soap/water/ dressing changes   Antic guidance given

## 2022-10-15 NOTE — Assessment & Plan Note (Addendum)
GFR is 47.35 Down from 3 mo ago but had uti with bactrim in August  Will work on fluid intake but need to be cautious due to hyponatremia  Avoid nsaids Watch for signs and symptoms of uti

## 2022-10-15 NOTE — Assessment & Plan Note (Signed)
bp in fair control at this time  BP Readings from Last 1 Encounters:  10/13/22 136/68   No changes needed Triam ct 37.5-25 one a day (used to take 2)  Lasix 10 mg daily for pedal edema  Verapamil SR 240 mg daily  Lisinopril 5 mg daily  Most recent labs reviewed  Disc lifstyle change with low sodium diet and exercise   Watching sodium (132)

## 2022-10-15 NOTE — Assessment & Plan Note (Signed)
Lab Results  Component Value Date   TSH 7.64 (H) 10/06/2022   Taking levothyroxine 112 mcg  Needs referral to new endocrinologist since Dr Carles Collet retired  Will do referral  No clinical changes  Does not want to change dose w/o seeing endo  Has thyroid nodules that were monitored by [place]

## 2022-10-15 NOTE — Assessment & Plan Note (Signed)
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

## 2022-10-15 NOTE — Assessment & Plan Note (Signed)
Plt ct of 515  This is new May be reactive- in light of recent wound and also uti last month No new bruisint/bleeding or clots   Re check in a month

## 2022-10-15 NOTE — Assessment & Plan Note (Signed)
Lasix 10 mg and maxzide 37.5-25 mg are helping  Encouraged low sodium diet /less processed foods Supp hose if needed

## 2022-10-15 NOTE — Assessment & Plan Note (Signed)
Lab Results  Component Value Date   HGBA1C 5.7 10/06/2022   Improved disc imp of low glycemic diet and wt loss to prevent DM2

## 2022-10-15 NOTE — Assessment & Plan Note (Signed)
Reviewed health habits including diet and exercise and skin cancer prevention Reviewed appropriate screening tests for age  Also reviewed health mt list, fam hx and immunization status , as well as social and family history   See HPI Labs reviewed and ordered Plans to get flu shot in oct at pharmacy Mammogram utd 11/2021- has next one scheduled  Colonoscopy 09/2020 with 5 y recall if able  Dexa 05/2020  Discussed fall prevention, supplements and exercise for bone density  D levels is normal  Utd derm care and screening  PHQ 0

## 2022-10-22 ENCOUNTER — Ambulatory Visit
Admission: EM | Admit: 2022-10-22 | Discharge: 2022-10-22 | Disposition: A | Discharge status: Home / Self Care | Payer: PPO | Attending: Emergency Medicine | Admitting: Emergency Medicine

## 2022-10-22 DIAGNOSIS — R319 Hematuria, unspecified: Secondary | ICD-10-CM | POA: Insufficient documentation

## 2022-10-22 DIAGNOSIS — N390 Urinary tract infection, site not specified: Secondary | ICD-10-CM | POA: Insufficient documentation

## 2022-10-22 LAB — POCT URINALYSIS DIP (MANUAL ENTRY)
Glucose, UA: NEGATIVE mg/dL
Nitrite, UA: POSITIVE — AB
Protein Ur, POC: 100 mg/dL — AB
Spec Grav, UA: 1.015 (ref 1.010–1.025)
Urobilinogen, UA: 1 U/dL
pH, UA: 6 (ref 5.0–8.0)

## 2022-10-22 MED ORDER — CEPHALEXIN 500 MG PO CAPS: 500.0000 mg | ORAL_CAPSULE | Freq: Two times a day (BID) | ORAL | 0 refills | Status: DC

## 2022-10-22 NOTE — Discharge Instructions (Addendum)
Take the antibiotic as directed.  The urine culture is pending.  We will call you if it shows the need to change or discontinue your antibiotic.    Follow up with your primary care provider tomorrow.

## 2022-10-22 NOTE — ED Provider Notes (Signed)
Renaldo Fiddler    CSN: [id number] Arrival date & time: 10/22/22  1144      History   Chief Complaint Chief Complaint  Patient presents with   Hematuria    HPI Amber Miles is a 77 y.o. female.  Accompanied by her daughter, patient presents with dysuria, urinary frequency, and hematuria since last night.  She also has bladder pressure.  No fever, abdominal pain, flank pain, pelvic pain, or other symptoms.  No OTC medications today.  Patient was seen by her PCP on 09/04/2022; diagnosed with cystitis and dysuria; urine culture positive for Klebsiella pneumoniae; treated with Bactrim.  The history is provided by the patient, a relative and medical records.    Past Medical History:  Diagnosis Date   Allergy    allergic rhinitis   Anxiety    Arthritis    OA knee replacement   Cataract    Degenerative disc disease, thoracic    Family history of breast cancer    Family history of genetic disease carrier    paternal cousin has NBN mutation   GERD (gastroesophageal reflux disease)    Hyperlipidemia    Hypertension    Hypothyroid    Reactive airways dysfunction syndrome (HCC)    Skin lesions, generalized    squamous cell skin lesions and basal cell skin lesions    Patient Active Problem List   Diagnosis Date Noted   Elevated platelet count 10/15/2022   Hormone replacement therapy (HRT) 10/13/2022   Multiple thyroid nodules 10/13/2022   Current use of proton pump inhibitor 10/05/2022   Skin tear of forearm without complication 10/02/2022   Decreased GFR 06/19/2022   Hoarseness of voice 05/08/2022   Routine general medical examination at a health care facility 07/29/2019   Genetic testing 01/02/2018   Family history of breast cancer    Family history of genetic disease carrier    Meningioma (HCC) 07/17/2017   Fall 01/04/2017   Atrophic vaginitis 09/01/2015   Pedal edema 05/27/2014   Estrogen deficiency 02/21/2013   Encounter for Medicare annual wellness exam  02/21/2013   Low back pain potentially associated with radiculopathy 10/11/2012   Allergic rhinitis 01/28/2010   Prediabetes 07/30/2009   Anxiety state 11/30/2008   Hyperlipidemia 04/13/2008   Hypothyroidism 01/07/2008   HYPERTENSION, BENIGN 01/07/2008   OSTEOARTHRITIS, GENERALIZED 01/07/2008    Past Surgical History:  Procedure Laterality Date   ABDOMINAL HYSTERECTOMY  1990s   hyst with oophrectomy for B9lesion/cyst and prolapse   BREAST BIOPSY Bilateral    benign   JOINT REPLACEMENT  05/2009   left total knee replacement   recocele     and cystocele repair   TONSILLECTOMY     tsa      OB History   No obstetric history on file.      Home Medications    Prior to Admission medications   Medication Sig Start Date End Date Taking? Authorizing Provider  cephALEXin (KEFLEX) 500 MG capsule Take 1 capsule (500 mg total) by mouth 2 (two) times daily for 5 days. 10/22/22 10/27/22 Yes Mickie Bail, NP  acyclovir (ZOVIRAX) 800 MG tablet TAKE 1 TABLET BY MOUTH 3 TIMES DAILY FOR 3 DAYS FLARES 04/09/14   [provider]  albuterol (VENTOLIN HFA) 108 (90 Base) MCG/ACT inhaler INHALE 1 TO 2 PUFFS EVERY 4 TO 6 HOURS AS NEEDED FOR WHEEZE OR COUGH 02/15/21   Tower, Audrie Gallus, MD  ARNUITY ELLIPTA 200 MCG/ACT AEPB Inhale 1 puff into the lungs daily.  [provider]  azelastine (ASTELIN) 0.1 % nasal spray Place 1 spray into both nostrils 2 (two) times daily. 05/07/19   [provider]  cetirizine (ZYRTEC) 5 MG tablet Take 5 mg by mouth daily.    [provider]  Docusate Calcium (STOOL SOFTENER PO) Take 1 tablet by mouth daily as needed.    [provider]  esomeprazole (NEXIUM) 40 MG capsule TAKE 1 CAPSULE BY MOUTH EVERY DAY BEFORE BREAKFAST 08/16/22   Tower, Audrie Gallus, MD  estradiol (VIVELLE-DOT) 0.0375 MG/24HR PLACE 1 PATCH ONTO THE SKIN 2 TIMES A WEEK. 09/22/22   Tower, Audrie Gallus, MD  furosemide (LASIX) 20 MG tablet TAKE 1/2 TABLET BY MOUTH DAILY 07/12/22    Tower, Audrie Gallus, MD  GLUCOSAMINE-CHONDROITIN-VIT D3 PO Take 1 capsule by mouth 2 (two) times daily.    [provider]  lisinopril (ZESTRIL) 5 MG tablet TAKE 1 TABLET BY MOUTH EVERY DAY 08/16/22   Tower, Idamae Schuller A, MD  montelukast (SINGULAIR) 10 MG tablet Take 10 mg by mouth daily.  11/23/11   [provider]  Multiple Vitamin (MULTIVITAMIN) tablet Take 1 tablet by mouth daily.      [provider]  potassium chloride SA (KLOR-CON M20) 20 MEQ tablet TAKE 1 TABLET BY MOUTH EVERY DAY 08/14/22   Tower, Audrie Gallus, MD  SYNTHROID 112 MCG tablet Take 112 mcg by mouth daily. 05/03/17   [provider]  tiZANidine (ZANAFLEX) 4 MG tablet TAKE 1 TABLET BY MOUTH EVERY 6 HOURS AS NEEDED 09/21/22   Tower, Audrie Gallus, MD  traMADol (ULTRAM) 50 MG tablet TAKE 1-2 TABLETS BY MOUTH EVERY 8 HOURS AS NEEDED FOR SEVERE(SHOULDER PAIN). CAUTION OF SEDATION 09/21/22   Tower, Audrie Gallus, MD  triamterene-hydrochlorothiazide (MAXZIDE-25) 37.5-25 MG tablet Take 1 tablet by mouth daily. 06/19/22   Tower, Audrie Gallus, MD  verapamil (CALAN-SR) 240 MG CR tablet TAKE 1 TABLET (240 MG TOTAL) BY MOUTH 2 (TWO) TIMES DAILY. 08/16/22   Tower, Audrie Gallus, MD    Family History Family History  Problem Relation Age of Onset   Hypertension Mother    Stroke Mother    Depression Mother    Alzheimer's disease Mother    Heart disease Father    Cerebral palsy Father    Hypertension Sister    Breast cancer Cousin 88       NBN mutation   Melanoma Maternal Uncle        dx >50    Social History Social History   Tobacco Use   Smoking status: Never   Smokeless tobacco: Never  Vaping Use   Vaping status: Never Used  Substance Use Topics   Alcohol use: No    Alcohol/week: 0.0 standard drinks of alcohol   Drug use: No     Allergies   Neosporin [neomycin-polymyxin-gramicidin] and Tape   Review of Systems Review of Systems  Constitutional:  Negative for chills and fever.  Gastrointestinal:  Negative for abdominal  pain and vomiting.  Genitourinary:  Positive for dysuria, frequency and hematuria. Negative for flank pain and pelvic pain.     Physical Exam Triage Vital Signs ED Triage Vitals  Encounter Vitals Group     BP      Systolic BP Percentile      Diastolic BP Percentile      Pulse      Resp      Temp      Temp src      SpO2  Weight      Height      Head Circumference      Peak Flow      Pain Score      Pain Loc      Pain Education      Exclude from Growth Chart    No data found.  Updated Vital Signs BP 96/60   Pulse 65   Temp (!) 97.4 F (36.3 C)   Resp 18   Visual Acuity Right Eye Distance:   Left Eye Distance:   Bilateral Distance:    Right Eye Near:   Left Eye Near:    Bilateral Near:     Physical Exam Constitutional:      General: She is not in acute distress. HENT:     Mouth/Throat:     Mouth: Mucous membranes are moist.  Cardiovascular:     Rate and Rhythm: Normal rate and regular rhythm.  Pulmonary:     Effort: Pulmonary effort is normal. No respiratory distress.  Abdominal:     General: Bowel sounds are normal.     Palpations: Abdomen is soft.     Tenderness: There is no abdominal tenderness. There is no right CVA tenderness, left CVA tenderness, guarding or rebound.  Skin:    General: Skin is warm and dry.  Neurological:     Mental Status: She is alert.  Psychiatric:        Mood and Affect: Mood normal.        Behavior: Behavior normal.      UC Treatments / Results  Labs (all labs ordered are listed, but only abnormal results are displayed) Labs Reviewed  POCT URINALYSIS DIP (MANUAL ENTRY) - Abnormal; Notable for the following components:      Result Value   Color, UA straw (*)    Clarity, UA cloudy (*)    Bilirubin, UA small (*)    Ketones, POC UA trace (5) (*)    Blood, UA large (*)    Protein Ur, POC =100 (*)    Nitrite, UA Positive (*)    Leukocytes, UA Large (3+) (*)    All other components within normal limits  URINE  CULTURE    EKG   Radiology No results found.  Procedures Procedures (including critical care time)  Medications Ordered in UC Medications - No data to display  Initial Impression / Assessment and Plan / UC Course  I have reviewed the triage vital signs and the nursing notes.  Pertinent labs & imaging results that were available during my care of the patient were reviewed by me and considered in my medical decision making (see chart for details).    UTI with hematuria.  Treating with Keflex. Urine culture pending. Discussed with patient that we will call her if the urine culture shows the need to change or discontinue the antibiotic. Instructed her to follow-up with her PCP tomorrow. Patient agrees to plan of care.     Final Clinical Impressions(s) / UC Diagnoses   Final diagnoses:  Urinary tract infection with hematuria, site unspecified     Discharge Instructions      Take the antibiotic as directed.  The urine culture is pending.  We will call you if it shows the need to change or discontinue your antibiotic.    Follow up with your primary care provider tomorrow.         ED Prescriptions     Medication Sig Dispense Auth. Provider   cephALEXin (KEFLEX) 500 MG  capsule Take 1 capsule (500 mg total) by mouth 2 (two) times daily for 5 days. 10 capsule Mickie Bail, NP      PDMP not reviewed this encounter.   Mickie Bail, NP 10/22/22 1332

## 2022-10-22 NOTE — ED Triage Notes (Signed)
Patient to Urgent Care with complaints of hematuria/ urinary frequency.   Symptoms started last night. Started feeling run down on Thursday.

## 2022-10-24 ENCOUNTER — Other Ambulatory Visit: Payer: Self-pay | Admitting: Family Medicine

## 2022-10-24 DIAGNOSIS — N6489 Other specified disorders of breast: Secondary | ICD-10-CM

## 2022-10-24 DIAGNOSIS — R921 Mammographic calcification found on diagnostic imaging of breast: Secondary | ICD-10-CM

## 2022-10-25 LAB — URINE CULTURE: Culture: 20000 — AB

## 2022-10-26 DIAGNOSIS — H40053 Ocular hypertension, bilateral: Secondary | ICD-10-CM | POA: Diagnosis not present

## 2022-10-27 ENCOUNTER — Encounter: Payer: Self-pay | Admitting: Family Medicine

## 2022-10-27 ENCOUNTER — Ambulatory Visit: Payer: PPO | Admitting: Family Medicine

## 2022-10-27 VITALS — BP 141/65 | HR 70 | Temp 97.7°F | Ht 67.0 in | Wt 230.2 lb

## 2022-10-27 DIAGNOSIS — R300 Dysuria: Secondary | ICD-10-CM | POA: Insufficient documentation

## 2022-10-27 DIAGNOSIS — Z87440 Personal history of urinary (tract) infections: Secondary | ICD-10-CM | POA: Diagnosis not present

## 2022-10-27 DIAGNOSIS — S51811D Laceration without foreign body of right forearm, subsequent encounter: Secondary | ICD-10-CM

## 2022-10-27 DIAGNOSIS — I10 Essential (primary) hypertension: Secondary | ICD-10-CM

## 2022-10-27 LAB — POC URINALSYSI DIPSTICK (AUTOMATED)
Bilirubin, UA: NEGATIVE
Blood, UA: NEGATIVE
Glucose, UA: NEGATIVE
Ketones, UA: NEGATIVE
Nitrite, UA: NEGATIVE
Protein, UA: NEGATIVE
Spec Grav, UA: 1.01 (ref 1.010–1.025)
Urobilinogen, UA: 0.2 U/dL
pH, UA: 7 (ref 5.0–8.0)

## 2022-10-27 NOTE — Progress Notes (Signed)
Subjective:    Patient ID: Amber Miles, female    DOB: Jul 18, 1945, 77 y.o.   MRN: [id number]  HPI  Wt Readings from Last 3 Encounters:  10/27/22 230 lb 4 oz (104.4 kg)  10/13/22 225 lb (102.1 kg)  10/05/22 225 lb 8 oz (102.3 kg)   36.06 kg/m  Vitals:   10/27/22 0959 10/27/22 1025  BP: (!) 152/74 (!) 141/65  Pulse: 70   Temp: 97.7 F (36.5 C)   SpO2: 96%    Pt presents for UC follow up for uti   Also re check right arm/skin tear     Was seen at [facility] in [place] on 10/6 for dysuria / frequency and hematuria for one day No flank pain  Was prevention seen /treatment for klebsiella uti on 8/19, treated with bactrim   Culture was positive for both e coli and strep gallolyticus  Sens to all but erythromycin  Was treated with keflex   Still a little bit of burning to urinate  No more blood  Feeling tired   Little nausea -thinks from antibiotic   Drinking lots of water  Some cranberry juice  Coffee drink  - little bit at a time    Results for orders placed or performed in visit on 10/27/22  POCT Urinalysis Dipstick (Automated)  Result Value Ref Range   Color, UA Yellow    Clarity, UA Clear    Glucose, UA Negative Negative   Bilirubin, UA Negative    Ketones, UA Negative    Spec Grav, UA 1.010 1.010 - 1.025   Blood, UA Negative    pH, UA 7.0 5.0 - 8.0   Protein, UA Negative Negative   Urobilinogen, UA 0.2 0.2 or 1.0 E.U./dL   Nitrite, UA Negative    Leukocytes, UA Small (1+) (A) Negative       Lab Results  Component Value Date   NA 132 (L) 10/06/2022   K 3.8 10/06/2022   CO2 26 10/06/2022   GLUCOSE 103 (H) 10/06/2022   BUN 15 10/06/2022   CREATININE 1.12 10/06/2022   CALCIUM 10.0 10/06/2022   GFR 47.35 (L) 10/06/2022   GFRNONAA 55 (L) 08/26/2012    HTN bp is stable today  No cp or palpitations or headaches or edema  No side effects to medicines  BP Readings from Last 3 Encounters:  10/27/22 (!) 141/65  10/22/22 96/60  10/13/22 136/68     Triam ct 37.5-25 one a day (used to take 2)  Lasix 10 mg daily for pedal edema  Verapamil SR 240 mg daily  Lisinopril 5 mg daily   Patient Active Problem List   Diagnosis Date Noted   Dysuria 10/27/2022   History of UTI 10/27/2022   Elevated platelet count 10/15/2022   Hormone replacement therapy (HRT) 10/13/2022   Multiple thyroid nodules 10/13/2022   Current use of proton pump inhibitor 10/05/2022   Skin tear of forearm without complication 10/02/2022   Decreased GFR 06/19/2022   Hoarseness of voice 05/08/2022   Routine general medical examination at a health care facility 07/29/2019   Genetic testing 01/02/2018   Family history of breast cancer    Family history of genetic disease carrier    Meningioma (HCC) 07/17/2017   Fall 01/04/2017   Atrophic vaginitis 09/01/2015   Pedal edema 05/27/2014   Estrogen deficiency 02/21/2013   Encounter for Medicare annual wellness exam 02/21/2013   Low back pain potentially associated with radiculopathy 10/11/2012   Allergic rhinitis 01/28/2010  Prediabetes 07/30/2009   Anxiety state 11/30/2008   Hyperlipidemia 04/13/2008   Hypothyroidism 01/07/2008   HYPERTENSION, BENIGN 01/07/2008   OSTEOARTHRITIS, GENERALIZED 01/07/2008   Past Medical History:  Diagnosis Date   Allergy    allergic rhinitis   Anxiety    Arthritis    OA knee replacement   Cataract    Degenerative disc disease, thoracic    Family history of breast cancer    Family history of genetic disease carrier    paternal cousin has NBN mutation   GERD (gastroesophageal reflux disease)    Hyperlipidemia    Hypertension    Hypothyroid    Reactive airways dysfunction syndrome (HCC)    Skin lesions, generalized    squamous cell skin lesions and basal cell skin lesions   Past Surgical History:  Procedure Laterality Date   ABDOMINAL HYSTERECTOMY  1990s   hyst with oophrectomy for B9lesion/cyst and prolapse   BREAST BIOPSY Bilateral    benign   JOINT REPLACEMENT   05/2009   left total knee replacement   recocele     and cystocele repair   TONSILLECTOMY     tsa     Social History   Tobacco Use   Smoking status: Never   Smokeless tobacco: Never  Vaping Use   Vaping status: Never Used  Substance Use Topics   Alcohol use: No    Alcohol/week: 0.0 standard drinks of alcohol   Drug use: No   Family History  Problem Relation Age of Onset   Hypertension Mother    Stroke Mother    Depression Mother    Alzheimer's disease Mother    Heart disease Father    Cerebral palsy Father    Hypertension Sister    Breast cancer Cousin 72       NBN mutation   Melanoma Maternal Uncle        dx >50   Allergies  Allergen Reactions   Neosporin [Neomycin-Polymyxin-Gramicidin] Other (See Comments)    blisters   Tape Swelling    Itching and redness Pt said severe reaction to surgical tape.? latex   Current Outpatient Medications on File Prior to Visit  Medication Sig Dispense Refill   acyclovir (ZOVIRAX) 800 MG tablet TAKE 1 TABLET BY MOUTH 3 TIMES DAILY FOR 3 DAYS FLARES  2   albuterol (VENTOLIN HFA) 108 (90 Base) MCG/ACT inhaler INHALE 1 TO 2 PUFFS EVERY 4 TO 6 HOURS AS NEEDED FOR WHEEZE OR COUGH 8.5 each 1   ARNUITY ELLIPTA 200 MCG/ACT AEPB Inhale 1 puff into the lungs daily.     azelastine (ASTELIN) 0.1 % nasal spray Place 1 spray into both nostrils 2 (two) times daily.     cetirizine (ZYRTEC) 5 MG tablet Take 5 mg by mouth daily as needed.     Docusate Calcium (STOOL SOFTENER PO) Take 1 tablet by mouth daily as needed.     esomeprazole (NEXIUM) 40 MG capsule TAKE 1 CAPSULE BY MOUTH EVERY DAY BEFORE BREAKFAST 90 capsule 1   estradiol (VIVELLE-DOT) 0.0375 MG/24HR PLACE 1 PATCH ONTO THE SKIN 2 TIMES A WEEK. 24 patch 1   furosemide (LASIX) 20 MG tablet TAKE 1/2 TABLET BY MOUTH DAILY 45 tablet 1   GLUCOSAMINE-CHONDROITIN-VIT D3 PO Take 1 capsule by mouth 2 (two) times daily.     lisinopril (ZESTRIL) 5 MG tablet TAKE 1 TABLET BY MOUTH EVERY DAY 90 tablet 1    montelukast (SINGULAIR) 10 MG tablet Take 10 mg by mouth daily.  Multiple Vitamin (MULTIVITAMIN) tablet Take 1 tablet by mouth daily.       potassium chloride SA (KLOR-CON M20) 20 MEQ tablet TAKE 1 TABLET BY MOUTH EVERY DAY 90 tablet 3   SYNTHROID 112 MCG tablet Take 112 mcg by mouth daily.  11   tiZANidine (ZANAFLEX) 4 MG tablet TAKE 1 TABLET BY MOUTH EVERY 6 HOURS AS NEEDED 360 tablet 0   traMADol (ULTRAM) 50 MG tablet TAKE 1-2 TABLETS BY MOUTH EVERY 8 HOURS AS NEEDED FOR SEVERE(SHOULDER PAIN). CAUTION OF SEDATION 30 tablet 0   triamterene-hydrochlorothiazide (MAXZIDE-25) 37.5-25 MG tablet Take 1 tablet by mouth daily. 1 tablet 0   verapamil (CALAN-SR) 240 MG CR tablet TAKE 1 TABLET (240 MG TOTAL) BY MOUTH 2 (TWO) TIMES DAILY. 180 tablet 2   No current facility-administered medications on file prior to visit.    Review of Systems  Constitutional:  Negative for activity change, appetite change, diaphoresis, fatigue, fever and unexpected weight change.  HENT:  Negative for congestion, ear pain, rhinorrhea, sinus pressure and sore throat.   Eyes:  Negative for pain, redness and visual disturbance.  Respiratory:  Negative for cough, shortness of breath and wheezing.   Cardiovascular:  Negative for chest pain and palpitations.  Gastrointestinal:  Negative for abdominal pain, blood in stool, constipation and diarrhea.  Endocrine: Negative for polydipsia and polyuria.  Genitourinary:  Positive for dysuria and frequency. Negative for hematuria and urgency.  Musculoskeletal:  Negative for arthralgias, back pain and myalgias.  Skin:  Positive for wound. Negative for pallor and rash.  Allergic/Immunologic: Negative for environmental allergies.  Neurological:  Negative for dizziness, syncope and headaches.  Hematological:  Negative for adenopathy. Does not bruise/bleed easily.  Psychiatric/Behavioral:  Negative for decreased concentration and dysphoric mood. The patient is not nervous/anxious.         Objective:   Physical Exam Constitutional:      General: She is not in acute distress.    Appearance: Normal appearance. She is well-developed. She is obese. She is not ill-appearing or diaphoretic.  HENT:     Head: Normocephalic and atraumatic.  Eyes:     Conjunctiva/sclera: Conjunctivae normal.     Pupils: Pupils are equal, round, and reactive to light.  Neck:     Thyroid: No thyromegaly.     Vascular: No carotid bruit or JVD.  Cardiovascular:     Rate and Rhythm: Normal rate and regular rhythm.     Heart sounds: Normal heart sounds.     No gallop.  Pulmonary:     Effort: Pulmonary effort is normal. No respiratory distress.     Breath sounds: Normal breath sounds. No wheezing or rales.  Abdominal:     General: There is no distension or abdominal bruit.     Palpations: Abdomen is soft.     Tenderness: There is no abdominal tenderness. There is no right CVA tenderness, left CVA tenderness, guarding or rebound.  Musculoskeletal:     Cervical back: Normal range of motion and neck supple.     Right lower leg: No edema.     Left lower leg: No edema.  Lymphadenopathy:     Cervical: No cervical adenopathy.  Skin:    General: Skin is warm and dry.     Coloration: Skin is not pale.     Findings: No rash.     Comments: Improved wound on right forearm One small 1-2 mm area of open skin  No drainage No new swelling or erythema or warmth  Replaced non stick pad today  Neurological:     Mental Status: She is alert.     Coordination: Coordination normal.     Deep Tendon Reflexes: Reflexes are normal and symmetric. Reflexes normal.  Psychiatric:        Mood and Affect: Mood normal.           Assessment & Plan:   Problem List Items Addressed This Visit       Cardiovascular and Mediastinum   HYPERTENSION, BENIGN - Primary    Blood pressure was low at UC, back to just above baseline today   BP: (!) 141/65   Continues Triam ct 37.5-25 one a day (used to take 2)   Lasix 10 mg daily for pedal edema  Verapamil SR 240 mg daily  Lisinopril 5 mg daily         Musculoskeletal and Integument   Skin tear of forearm without complication    Continues to heal Only one small 2-3 mm area of abrasion left - rest of area is dry and scabbed Replaced the vaseline gauze with non stick pad and netting to hold in place  Instructed to keep clean with soap /water When completely dry can stop dressing  Watch for signs and symptoms of infection   Call back and Er precautions noted in detail today          Genitourinary   History of UTI    Reviewed notes/plan from uti in aug and sept along with urinalysis and culture   Recent treatment with keflex  Urinalysis improved Culture pending       Relevant Orders   POCT Urinalysis Dipstick (Automated) (Completed)   Urine Culture     Other   Dysuria    Very slight dysuria s/p uri treated with keflex Urinalysis with small leuk today Culture pending  Instructed to drink water only /avoid acids (cranberry,coffee,tea) Pend culture will treat longer only if needed        Relevant Orders   Urine Culture

## 2022-10-27 NOTE — Assessment & Plan Note (Signed)
Very slight dysuria s/p uri treated with keflex Urinalysis with small leuk today Culture pending  Instructed to drink water only /avoid acids (cranberry,coffee,tea) Pend culture will treat longer only if needed

## 2022-10-27 NOTE — Patient Instructions (Addendum)
Drink lots of water  Avoid other beverages for now - hold the cranberry juice and coffee and tea for 1-2 weeks  Avoid spicy foods also  Update if not continue to improve in a week or if worsening   We will culture urine and call with result   Skin tear looks better   You can dress with aquaphor and non stick pad by itself Once the last spot looks dry you can stop dressing it  Continue soap and water to clean

## 2022-10-27 NOTE — Assessment & Plan Note (Signed)
Blood pressure was low at UC, back to just above baseline today   BP: (!) 141/65   Continues Triam ct 37.5-25 one a day (used to take 2)  Lasix 10 mg daily for pedal edema  Verapamil SR 240 mg daily  Lisinopril 5 mg daily

## 2022-10-27 NOTE — Assessment & Plan Note (Signed)
Reviewed notes/plan from uti in aug and sept along with urinalysis and culture   Recent treatment with keflex  Urinalysis improved Culture pending

## 2022-10-27 NOTE — Assessment & Plan Note (Signed)
Continues to heal Only one small 2-3 mm area of abrasion left - rest of area is dry and scabbed Replaced the vaseline gauze with non stick pad and netting to hold in place  Instructed to keep clean with soap /water When completely dry can stop dressing  Watch for signs and symptoms of infection   Call back and Er precautions noted in detail today

## 2022-10-28 LAB — URINE CULTURE
MICRO NUMBER:: 15584717
Result:: NO GROWTH
SPECIMEN QUALITY:: ADEQUATE

## 2022-10-31 ENCOUNTER — Telehealth: Payer: Self-pay | Admitting: Family Medicine

## 2022-10-31 NOTE — Telephone Encounter (Signed)
Patient returned call regarding lab results.I shared with her the message from Dr Milinda Antis.She verbalized understanding and stated that she will call us if she needs [place].

## 2022-11-06 DIAGNOSIS — E890 Postprocedural hypothyroidism: Secondary | ICD-10-CM | POA: Diagnosis not present

## 2022-11-06 DIAGNOSIS — E041 Nontoxic single thyroid nodule: Secondary | ICD-10-CM | POA: Diagnosis not present

## 2022-11-13 ENCOUNTER — Other Ambulatory Visit (INDEPENDENT_AMBULATORY_CARE_PROVIDER_SITE_OTHER): Payer: PPO

## 2022-11-13 DIAGNOSIS — R944 Abnormal results of kidney function studies: Secondary | ICD-10-CM | POA: Diagnosis not present

## 2022-11-13 DIAGNOSIS — R7989 Other specified abnormal findings of blood chemistry: Secondary | ICD-10-CM | POA: Diagnosis not present

## 2022-11-14 ENCOUNTER — Encounter: Payer: Self-pay | Admitting: Podiatry

## 2022-11-14 ENCOUNTER — Ambulatory Visit: Payer: PPO | Admitting: Podiatry

## 2022-11-14 VITALS — Ht 67.0 in | Wt 230.0 lb

## 2022-11-14 DIAGNOSIS — M79675 Pain in left toe(s): Secondary | ICD-10-CM | POA: Diagnosis not present

## 2022-11-14 DIAGNOSIS — B351 Tinea unguium: Secondary | ICD-10-CM | POA: Diagnosis not present

## 2022-11-14 DIAGNOSIS — M79674 Pain in right toe(s): Secondary | ICD-10-CM

## 2022-11-14 LAB — CBC WITH DIFFERENTIAL/PLATELET
Basophils Absolute: 0.1 10*3/uL (ref 0.0–0.1)
Basophils Relative: 1.3 % (ref 0.0–3.0)
Eosinophils Absolute: 0.1 10*3/uL (ref 0.0–0.7)
Eosinophils Relative: 0.8 % (ref 0.0–5.0)
HCT: 41.5 % (ref 36.0–46.0)
Hemoglobin: 13.2 g/dL (ref 12.0–15.0)
Lymphocytes Relative: 15.6 % (ref 12.0–46.0)
Lymphs Abs: 1.2 10*3/uL (ref 0.7–4.0)
MCHC: 31.8 g/dL (ref 30.0–36.0)
MCV: 86.9 fL (ref 78.0–100.0)
Monocytes Absolute: 0.6 10*3/uL (ref 0.1–1.0)
Monocytes Relative: 7.9 % (ref 3.0–12.0)
Neutro Abs: 5.9 10*3/uL (ref 1.4–7.7)
Neutrophils Relative %: 74.4 % (ref 43.0–77.0)
Platelets: 452 10*3/uL — ABNORMAL HIGH (ref 150.0–400.0)
RBC: 4.78 Mil/uL (ref 3.87–5.11)
RDW: 15.5 % (ref 11.5–15.5)
WBC: 7.9 10*3/uL (ref 4.0–10.5)

## 2022-11-14 LAB — BASIC METABOLIC PANEL
BUN: 16 mg/dL (ref 6–23)
CO2: 28 meq/L (ref 19–32)
Calcium: 9.6 mg/dL (ref 8.4–10.5)
Chloride: 98 meq/L (ref 96–112)
Creatinine, Ser: 1.1 mg/dL (ref 0.40–1.20)
GFR: 48.35 mL/min — ABNORMAL LOW (ref >60.00)
Glucose, Bld: 110 mg/dL — ABNORMAL HIGH (ref 70–99)
Potassium: 3.7 meq/L (ref 3.5–5.1)
Sodium: 133 meq/L — ABNORMAL LOW (ref 135–145)

## 2022-11-14 NOTE — Progress Notes (Signed)
   Chief Complaint  Patient presents with   Nail Problem    Patient is here for nail trimming     SUBJECTIVE Patient presents to office today complaining of elongated, thickened nails that cause pain while ambulating in shoes.  Patient is unable to trim their own nails.  Patient also requesting additional silicone toe caps to alleviate pressure from her hammertoe deformity.  Patient is here for further evaluation and treatment.  Past Medical History:  Diagnosis Date   Allergy    allergic rhinitis   Anxiety    Arthritis    OA knee replacement   Cataract    Degenerative disc disease, thoracic    Family history of breast cancer    Family history of genetic disease carrier    paternal cousin has NBN mutation   GERD (gastroesophageal reflux disease)    Hyperlipidemia    Hypertension    Hypothyroid    Reactive airways dysfunction syndrome (HCC)    Skin lesions, generalized    squamous cell skin lesions and basal cell skin lesions    Allergies  Allergen Reactions   Neosporin [Neomycin-Polymyxin-Gramicidin] Other (See Comments)    blisters   Tape Swelling    Itching and redness Pt said severe reaction to surgical tape.? latex     OBJECTIVE General Patient is awake, alert, and oriented x 3 and in no acute distress. Derm Skin is dry and supple bilateral. Negative open lesions or macerations. Remaining integument unremarkable. Nails are tender, long, thickened and dystrophic with subungual debris, consistent with onychomycosis, 1-5 bilateral. No signs of infection noted. Vasc  DP and PT pedal pulses palpable bilaterally. Temperature gradient within normal limits.  Neuro Epicritic and protective threshold sensation grossly intact bilaterally.  Musculoskeletal Exam No symptomatic pedal deformities noted bilateral. Muscular strength within normal limits.  ASSESSMENT 1.  Pain due to onychomycosis of toenails both 2.  Hammertoes bilateral 3.  Capsulitis/DJD right ankle; currently  asymptomatic  PLAN OF CARE -Patient evaluated today.  -Instructed to maintain good pedal hygiene and foot care.  -Mechanical debridement of nails 1-5 bilaterally performed using a nail nipper. Filed with dremel without incident.  -Continue silicone toe caps for the hammertoes to alleviate pressure from the distal tips of the toes -Continue to refrain from going barefoot.  Recommend good supportive tennis shoes -Return to clinic 3 months routine footcare   Felecia Shelling, DPM [facility]  Dr. Felecia Shelling, DPM    [address] [place], [place] [address]                Office [phone]  Fax [phone]

## 2022-11-15 ENCOUNTER — Other Ambulatory Visit: Payer: Self-pay | Admitting: Family Medicine

## 2022-11-16 NOTE — Telephone Encounter (Signed)
Please refill both for 90 days with one refill if due Thanks

## 2022-11-16 NOTE — Telephone Encounter (Signed)
Called patient about her lab results and she was asking about her refills for lisinopril and nexium. Looks like they were refused and she would like them sent in. She stated that she is out of refills.

## 2022-11-17 MED ORDER — LISINOPRIL 5 MG PO TABS: 5.0000 mg | ORAL_TABLET | Freq: Every day | ORAL | 1 refills | Status: DC

## 2022-11-17 MED ORDER — ESOMEPRAZOLE MAGNESIUM 40 MG PO CPDR: DELAYED_RELEASE_CAPSULE | ORAL | 1 refills | Status: DC

## 2022-11-17 NOTE — Addendum Note (Signed)
Addended by: Wendie Simmer B on: 11/17/2022 03:10 PM   Modules accepted: Orders

## 2022-12-06 DIAGNOSIS — J383 Other diseases of vocal cords: Secondary | ICD-10-CM | POA: Diagnosis not present

## 2022-12-06 DIAGNOSIS — R490 Dysphonia: Secondary | ICD-10-CM | POA: Diagnosis not present

## 2022-12-07 ENCOUNTER — Ambulatory Visit
Admission: RE | Admit: 2022-12-07 | Discharge: 2022-12-07 | Disposition: A | Discharge status: Home / Self Care | Payer: PPO | Source: Ambulatory Visit | Attending: Family Medicine | Admitting: Family Medicine

## 2022-12-07 DIAGNOSIS — N6489 Other specified disorders of breast: Secondary | ICD-10-CM

## 2022-12-07 DIAGNOSIS — R921 Mammographic calcification found on diagnostic imaging of breast: Secondary | ICD-10-CM

## 2022-12-07 DIAGNOSIS — R928 Other abnormal and inconclusive findings on diagnostic imaging of breast: Secondary | ICD-10-CM | POA: Diagnosis not present

## 2022-12-18 ENCOUNTER — Other Ambulatory Visit: Payer: Self-pay | Admitting: Family Medicine

## 2022-12-19 NOTE — Telephone Encounter (Signed)
Name of Medication: Tramadol Name of Pharmacy: [name] Last Fill or Written Date and Quantity: 09/21/22 #30 tab/ 0 refill Last Office Visit and Type: f/u 10/27/22 Next Office Visit and Type: f/u 12/26/22   Lasix  filled on 07/12/22 #45 tabs/ 1 refill

## 2022-12-26 ENCOUNTER — Encounter: Payer: Self-pay | Admitting: Family Medicine

## 2022-12-26 ENCOUNTER — Ambulatory Visit (INDEPENDENT_AMBULATORY_CARE_PROVIDER_SITE_OTHER): Payer: PPO | Admitting: Family Medicine

## 2022-12-26 VITALS — BP 135/65 | HR 68 | Temp 98.4°F | Ht 67.0 in | Wt 227.0 lb

## 2022-12-26 DIAGNOSIS — I10 Essential (primary) hypertension: Secondary | ICD-10-CM | POA: Diagnosis not present

## 2022-12-26 DIAGNOSIS — R944 Abnormal results of kidney function studies: Secondary | ICD-10-CM

## 2022-12-26 DIAGNOSIS — E785 Hyperlipidemia, unspecified: Secondary | ICD-10-CM

## 2022-12-26 DIAGNOSIS — E039 Hypothyroidism, unspecified: Secondary | ICD-10-CM

## 2022-12-26 DIAGNOSIS — J3089 Other allergic rhinitis: Secondary | ICD-10-CM

## 2022-12-26 MED ORDER — LOSARTAN POTASSIUM 50 MG PO TABS: 50.0000 mg | ORAL_TABLET | Freq: Every day | ORAL | 1 refills | Status: DC

## 2022-12-26 NOTE — Progress Notes (Signed)
Subjective:    Patient ID: Amber Miles, female    DOB: June 16, 1945, 77 y.o.   MRN: [id number]  HPI  Wt Readings from Last 3 Encounters:  12/26/22 227 lb (103 kg)  11/14/22 230 lb (104.3 kg)  10/27/22 230 lb 4 oz (104.4 kg)   35.55 kg/m  Vitals:   12/26/22 1431 12/26/22 1454  BP: (!) 146/74 135/65  Pulse: 68   Temp: 98.4 F (36.9 C)   SpO2: 95%    Pt presents for follow up of HTN and chronic health problems   Shoulder is bothering her more lately  Refilled tramadol  Has been doing more with holidays - very busy   Feeling pretty good overall    HTN bp is stable today  No cp or palpitations or headaches or edema  No side effects to medicines  BP Readings from Last 3 Encounters:  12/26/22 135/65  10/27/22 (!) 141/65  10/22/22 96/60     Triam hct 37.5-25 mg once daily   (used to take 2)  Lasix 10 mg prn pedal edema  Verapamil xr 240 mg daily  Lisinopril 5 mg daily   Pulse Readings from Last 3 Encounters:  12/26/22 68  10/27/22 70  10/22/22 65   Cough lately  No fever  Feels ok     Lab Results  Component Value Date   NA 133 (L) 11/13/2022   K 3.7 11/13/2022   CO2 28 11/13/2022   GLUCOSE 110 (H) 11/13/2022   BUN 16 11/13/2022   CREATININE 1.10 11/13/2022   CALCIUM 9.6 11/13/2022   GFR 48.35 (L) 11/13/2022   GFRNONAA 55 (L) 08/26/2012  Is working on fluids  At minimum 8 glasses per day      Lab Results  Component Value Date   HGBA1C 5.7 10/06/2022   Lab Results  Component Value Date   CHOL 158 10/06/2022   HDL 49.00 10/06/2022   LDLCALC 86 10/06/2022   LDLDIRECT 114.0 08/04/2020   TRIG 119.0 10/06/2022   CHOLHDL 3 10/06/2022     Patient Active Problem List   Diagnosis Date Noted   History of UTI 10/27/2022   Elevated platelet count 10/15/2022   Hormone replacement therapy (HRT) 10/13/2022   Multiple thyroid nodules 10/13/2022   Current use of proton pump inhibitor 10/05/2022   Decreased GFR 06/19/2022   Hoarseness of voice  05/08/2022   Routine general medical examination at a health care facility 07/29/2019   Genetic testing 01/02/2018   Family history of breast cancer    Family history of genetic disease carrier    Meningioma (HCC) 07/17/2017   Fall 01/04/2017   Atrophic vaginitis 09/01/2015   Pedal edema 05/27/2014   Estrogen deficiency 02/21/2013   Encounter for Medicare annual wellness exam 02/21/2013   Low back pain potentially associated with radiculopathy 10/11/2012   Allergic rhinitis 01/28/2010   Prediabetes 07/30/2009   Anxiety state 11/30/2008   Hyperlipidemia 04/13/2008   Hypothyroidism 01/07/2008   HYPERTENSION, BENIGN 01/07/2008   OSTEOARTHRITIS, GENERALIZED 01/07/2008   Past Medical History:  Diagnosis Date   Allergy    allergic rhinitis   Anxiety    Arthritis    OA knee replacement   Cataract    Degenerative disc disease, thoracic    Family history of breast cancer    Family history of genetic disease carrier    paternal cousin has NBN mutation   GERD (gastroesophageal reflux disease)    Hyperlipidemia    Hypertension    Hypothyroid  Reactive airways dysfunction syndrome (HCC)    Skin lesions, generalized    squamous cell skin lesions and basal cell skin lesions   Past Surgical History:  Procedure Laterality Date   ABDOMINAL HYSTERECTOMY  1990s   hyst with oophrectomy for B9lesion/cyst and prolapse   BREAST BIOPSY Bilateral    benign   JOINT REPLACEMENT  05/2009   left total knee replacement   recocele     and cystocele repair   TONSILLECTOMY     tsa     Social History   Tobacco Use   Smoking status: Never   Smokeless tobacco: Never  Vaping Use   Vaping status: Never Used  Substance Use Topics   Alcohol use: No    Alcohol/week: 0.0 standard drinks of alcohol   Drug use: No   Family History  Problem Relation Age of Onset   Hypertension Mother    Stroke Mother    Depression Mother    Alzheimer's disease Mother    Heart disease Father    Cerebral  palsy Father    Hypertension Sister    Breast cancer Cousin 38       NBN mutation   Melanoma Maternal Uncle        dx >50   Allergies  Allergen Reactions   Neosporin [Neomycin-Polymyxin-Gramicidin] Other (See Comments)    blisters   Tape Swelling    Itching and redness Pt said severe reaction to surgical tape.? latex   Current Outpatient Medications on File Prior to Visit  Medication Sig Dispense Refill   acyclovir (ZOVIRAX) 800 MG tablet TAKE 1 TABLET BY MOUTH 3 TIMES DAILY FOR 3 DAYS FLARES  2   albuterol (VENTOLIN HFA) 108 (90 Base) MCG/ACT inhaler INHALE 1 TO 2 PUFFS EVERY 4 TO 6 HOURS AS NEEDED FOR WHEEZE OR COUGH 8.5 each 1   ARNUITY ELLIPTA 200 MCG/ACT AEPB Inhale 1 puff into the lungs daily.     azelastine (ASTELIN) 0.1 % nasal spray Place 1 spray into both nostrils 2 (two) times daily.     cetirizine (ZYRTEC) 5 MG tablet Take 5 mg by mouth daily as needed.     Docusate Calcium (STOOL SOFTENER PO) Take 1 tablet by mouth daily as needed.     esomeprazole (NEXIUM) 40 MG capsule TAKE 1 CAPSULE BY MOUTH EVERY DAY BEFORE BREAKFAST 90 capsule 1   estradiol (VIVELLE-DOT) 0.0375 MG/24HR PLACE 1 PATCH ONTO THE SKIN 2 TIMES A WEEK. 24 patch 1   furosemide (LASIX) 20 MG tablet TAKE 1/2 TABLET BY MOUTH DAILY 45 tablet 1   GLUCOSAMINE-CHONDROITIN-VIT D3 PO Take 1 capsule by mouth 2 (two) times daily.     levothyroxine (SYNTHROID) 112 MCG tablet Take 112 mcg by mouth daily before breakfast.     montelukast (SINGULAIR) 10 MG tablet Take 10 mg by mouth daily.      Multiple Vitamin (MULTIVITAMIN) tablet Take 1 tablet by mouth daily.       potassium chloride SA (KLOR-CON M20) 20 MEQ tablet TAKE 1 TABLET BY MOUTH EVERY DAY 90 tablet 3   tiZANidine (ZANAFLEX) 4 MG tablet TAKE 1 TABLET BY MOUTH EVERY 6 HOURS AS NEEDED 360 tablet 0   traMADol (ULTRAM) 50 MG tablet TAKE 1-2 TABLETS BY MOUTH EVERY 8 HOURS AS NEEDED FOR SEVERE(SHOULDER PAIN). CAUTION OF SEDATION 30 tablet 0    triamterene-hydrochlorothiazide (MAXZIDE-25) 37.5-25 MG tablet Take 1 tablet by mouth daily. 1 tablet 0   verapamil (CALAN-SR) 240 MG CR tablet TAKE 1 TABLET (240 MG  TOTAL) BY MOUTH 2 (TWO) TIMES DAILY. 180 tablet 2   No current facility-administered medications on file prior to visit.    Review of Systems  Constitutional:  Negative for activity change, appetite change, fatigue, fever and unexpected weight change.  HENT:  Negative for congestion, ear pain, rhinorrhea, sinus pressure and sore throat.        Hears pulse in right ear  ENT thinks it is bone /conductive related   Eyes:  Negative for pain, redness and visual disturbance.  Respiratory:  Negative for cough, shortness of breath and wheezing.   Cardiovascular:  Negative for chest pain and palpitations.  Gastrointestinal:  Negative for abdominal pain, blood in stool, constipation and diarrhea.  Endocrine: Negative for polydipsia and polyuria.  Genitourinary:  Negative for dysuria, frequency and urgency.  Musculoskeletal:  Positive for arthralgias. Negative for back pain and myalgias.  Skin:  Negative for pallor and rash.  Allergic/Immunologic: Negative for environmental allergies.  Neurological:  Negative for dizziness, syncope and headaches.  Hematological:  Negative for adenopathy. Does not bruise/bleed easily.  Psychiatric/Behavioral:  Negative for decreased concentration and dysphoric mood. The patient is not nervous/anxious.        Staying very busy with church and family holiday activities        Objective:   Physical Exam Constitutional:      General: She is not in acute distress.    Appearance: Normal appearance. She is well-developed. She is obese. She is not ill-appearing or diaphoretic.  HENT:     Head: Normocephalic and atraumatic.  Eyes:     Conjunctiva/sclera: Conjunctivae normal.     Pupils: Pupils are equal, round, and reactive to light.  Neck:     Thyroid: No thyromegaly.     Vascular: No carotid bruit or  JVD.  Cardiovascular:     Rate and Rhythm: Normal rate and regular rhythm.     Heart sounds: Normal heart sounds.     No gallop.  Pulmonary:     Effort: Pulmonary effort is normal. No respiratory distress.     Breath sounds: Normal breath sounds. No stridor. No wheezing, rhonchi or rales.     Comments: Good air exch  Some upper airway sounds cleared by cough Abdominal:     General: There is no distension or abdominal bruit.     Palpations: Abdomen is soft.  Musculoskeletal:     Cervical back: Normal range of motion and neck supple.     Right lower leg: No edema.     Left lower leg: No edema.  Lymphadenopathy:     Cervical: No cervical adenopathy.  Skin:    General: Skin is warm and dry.     Coloration: Skin is not pale.     Findings: No rash.  Neurological:     Mental Status: She is alert.     Coordination: Coordination normal.     Deep Tendon Reflexes: Reflexes are normal and symmetric. Reflexes normal.  Psychiatric:        Mood and Affect: Mood normal.           Assessment & Plan:   Problem List Items Addressed This Visit       Cardiovascular and Mediastinum   HYPERTENSION, BENIGN - Primary    bp in fair control at this time  BP Readings from Last 1 Encounters:  12/26/22 135/65   In light of chronic cough will try a switch from lisinopril 5 mg daily to losartan 50 mg daily and follow up  in 2 mo  Encouraged good hydration  Continue  Triam hct 37.5025 mg daily  Verapamil XR 240 mg daily  Lasix 10 mg (? Prn)    Most recent labs reviewed  Disc lifstyle change with low sodium diet and exercise         Relevant Medications   losartan (COZAAR) 50 MG tablet     Respiratory   Allergic rhinitis    Unsure if this is causing cough or ace  Will change ace to arb and follow up   Reassuring exam          Other   Decreased GFR    Today changed lisinopril to losartan 50 mg daily  Good hydration  Will check lab at 2 mo follow up for blood pressure    Continues triam hct at 37.5-25 mg daily (cut from 2 pills daily)

## 2022-12-26 NOTE — Assessment & Plan Note (Signed)
Unsure if this is causing cough or ace  Will change ace to arb and follow up   Reassuring exam

## 2022-12-26 NOTE — Assessment & Plan Note (Signed)
Today changed lisinopril to losartan 50 mg daily  Good hydration  Will check lab at 2 mo follow up for blood pressure   Continues triam hct at 37.5-25 mg daily (cut from 2 pills daily)

## 2022-12-26 NOTE — Assessment & Plan Note (Signed)
bp in fair control at this time  BP Readings from Last 1 Encounters:  12/26/22 135/65   In light of chronic cough will try a switch from lisinopril 5 mg daily to losartan 50 mg daily and follow up in 2 mo  Encouraged good hydration  Continue  Triam hct 37.5025 mg daily  Verapamil XR 240 mg daily  Lasix 10 mg (? Prn)    Most recent labs reviewed  Disc lifstyle change with low sodium diet and exercise

## 2022-12-26 NOTE — Patient Instructions (Addendum)
Stop your lisinopril  It may be adding to the chronic cough   Start losartan 50 mg   Stay well hydrated    If cough worsens or if you develop new symptoms let us know   Stay active Take care of yourself

## 2023-01-02 DIAGNOSIS — D225 Melanocytic nevi of trunk: Secondary | ICD-10-CM | POA: Diagnosis not present

## 2023-01-02 DIAGNOSIS — C44319 Basal cell carcinoma of skin of other parts of face: Secondary | ICD-10-CM | POA: Diagnosis not present

## 2023-01-02 DIAGNOSIS — L821 Other seborrheic keratosis: Secondary | ICD-10-CM | POA: Diagnosis not present

## 2023-01-02 DIAGNOSIS — C44612 Basal cell carcinoma of skin of right upper limb, including shoulder: Secondary | ICD-10-CM | POA: Diagnosis not present

## 2023-01-02 DIAGNOSIS — D485 Neoplasm of uncertain behavior of skin: Secondary | ICD-10-CM | POA: Diagnosis not present

## 2023-01-02 DIAGNOSIS — Z85828 Personal history of other malignant neoplasm of skin: Secondary | ICD-10-CM | POA: Diagnosis not present

## 2023-01-02 DIAGNOSIS — D692 Other nonthrombocytopenic purpura: Secondary | ICD-10-CM | POA: Diagnosis not present

## 2023-01-22 ENCOUNTER — Other Ambulatory Visit: Payer: Self-pay | Admitting: Family Medicine

## 2023-01-22 NOTE — Telephone Encounter (Signed)
 Name of Medication: Tramadol Name of Pharmacy: [name] Last Fill or Written Date and Quantity: 12/19/22 #30 tab/ 0 refill Last Office Visit and Type: f/u 12/26/22 Next Office Visit and Type: f/u 02/26/23

## 2023-01-23 ENCOUNTER — Ambulatory Visit: Payer: PPO | Admitting: Podiatry

## 2023-01-23 ENCOUNTER — Encounter: Payer: Self-pay | Admitting: Podiatry

## 2023-01-23 DIAGNOSIS — M79674 Pain in right toe(s): Secondary | ICD-10-CM

## 2023-01-23 DIAGNOSIS — B351 Tinea unguium: Secondary | ICD-10-CM

## 2023-01-23 DIAGNOSIS — M79675 Pain in left toe(s): Secondary | ICD-10-CM

## 2023-01-23 NOTE — Progress Notes (Signed)
   Chief Complaint  Patient presents with   Nail Problem    Trim my nails.    SUBJECTIVE Patient presents to office today complaining of elongated, thickened nails that cause pain while ambulating in shoes.  Patient is unable to trim their own nails. Patient is here for further evaluation and treatment.  Past Medical History:  Diagnosis Date   Allergy    allergic rhinitis   Anxiety    Arthritis    OA knee replacement   Cataract    Degenerative disc disease, thoracic    Family history of breast cancer    Family history of genetic disease carrier    paternal cousin has NBN mutation   GERD (gastroesophageal reflux disease)    Hyperlipidemia    Hypertension    Hypothyroid    Reactive airways dysfunction syndrome (HCC)    Skin lesions, generalized    squamous cell skin lesions and basal cell skin lesions    Allergies  Allergen Reactions   Neosporin [Neomycin -Polymyxin-Gramicidin] Other (See Comments)    blisters   Tape Swelling    Itching and redness Pt said severe reaction to surgical tape.? latex     OBJECTIVE General Patient is awake, alert, and oriented x 3 and in no acute distress. Derm Skin is dry and supple bilateral. Negative open lesions or macerations. Remaining integument unremarkable. Nails are tender, long, thickened and dystrophic with subungual debris, consistent with onychomycosis, 1-5 bilateral. No signs of infection noted. Vasc  DP and PT pedal pulses palpable bilaterally. Temperature gradient within normal limits.  Neuro Epicritic and protective threshold sensation grossly intact bilaterally.  Musculoskeletal Exam No symptomatic pedal deformities noted bilateral. Muscular strength within normal limits.  ASSESSMENT 1.  Pain due to onychomycosis of toenails both  PLAN OF CARE 1. Patient evaluated today.  2. Instructed to maintain good pedal hygiene and foot care.  3. Mechanical debridement of nails 1-5 bilaterally performed using a nail nipper. Filed  with dremel without incident.  4. Return to clinic in 3 mos.    Thresa EMERSON Sar, DPM [facility]  Dr. Thresa EMERSON Sar, DPM    [address] [place], [place] [address]                Office [phone]  Fax [phone]

## 2023-02-04 ENCOUNTER — Telehealth: Payer: Self-pay | Admitting: Family Medicine

## 2023-02-04 DIAGNOSIS — R944 Abnormal results of kidney function studies: Secondary | ICD-10-CM

## 2023-02-04 DIAGNOSIS — R7989 Other specified abnormal findings of blood chemistry: Secondary | ICD-10-CM

## 2023-02-04 DIAGNOSIS — I10 Essential (primary) hypertension: Secondary | ICD-10-CM

## 2023-02-04 NOTE — Telephone Encounter (Signed)
-----   Message from Lovena Neighbours sent at 01/23/2023  2:30 PM EST ----- Regarding: Labs for Tuesday 1.21.25 Please put lab orders in future. Thank you, Denny Peon

## 2023-02-06 ENCOUNTER — Other Ambulatory Visit (INDEPENDENT_AMBULATORY_CARE_PROVIDER_SITE_OTHER): Payer: PPO

## 2023-02-06 DIAGNOSIS — R944 Abnormal results of kidney function studies: Secondary | ICD-10-CM | POA: Diagnosis not present

## 2023-02-06 DIAGNOSIS — R7989 Other specified abnormal findings of blood chemistry: Secondary | ICD-10-CM

## 2023-02-06 DIAGNOSIS — E039 Hypothyroidism, unspecified: Secondary | ICD-10-CM | POA: Diagnosis not present

## 2023-02-06 DIAGNOSIS — I10 Essential (primary) hypertension: Secondary | ICD-10-CM | POA: Diagnosis not present

## 2023-02-06 NOTE — Addendum Note (Signed)
Addended by: Vincenza Hews on: 02/06/2023 04:30 PM   Modules accepted: Orders

## 2023-02-07 LAB — CBC WITH DIFFERENTIAL/PLATELET
Basophils Absolute: 0.2 10*3/uL — ABNORMAL HIGH (ref 0.0–0.1)
Basophils Relative: 1.6 % (ref 0.0–3.0)
Eosinophils Absolute: 0.3 10*3/uL (ref 0.0–0.7)
Eosinophils Relative: 3 % (ref 0.0–5.0)
HCT: 39.6 % (ref 36.0–46.0)
Hemoglobin: 13 g/dL (ref 12.0–15.0)
Lymphocytes Relative: 16.5 % (ref 12.0–46.0)
Lymphs Abs: 1.7 10*3/uL (ref 0.7–4.0)
MCHC: 32.9 g/dL (ref 30.0–36.0)
MCV: 84.4 fL (ref 78.0–100.0)
Monocytes Absolute: 0.8 10*3/uL (ref 0.1–1.0)
Monocytes Relative: 8 % (ref 3.0–12.0)
Neutro Abs: 7.1 10*3/uL (ref 1.4–7.7)
Neutrophils Relative %: 70.9 % (ref 43.0–77.0)
Platelets: 548 10*3/uL — ABNORMAL HIGH (ref 150.0–400.0)
RBC: 4.69 Mil/uL (ref 3.87–5.11)
RDW: 15.8 % — ABNORMAL HIGH (ref 11.5–15.5)
WBC: 10.1 10*3/uL (ref 4.0–10.5)

## 2023-02-07 LAB — BASIC METABOLIC PANEL
BUN: 16 mg/dL (ref 6–23)
CO2: 27 meq/L (ref 19–32)
Calcium: 9.9 mg/dL (ref 8.4–10.5)
Chloride: 101 meq/L (ref 96–112)
Creatinine, Ser: 0.98 mg/dL (ref 0.40–1.20)
GFR: 55.45 mL/min — ABNORMAL LOW (ref >60.00)
Glucose, Bld: 92 mg/dL (ref 70–99)
Potassium: 3.8 meq/L (ref 3.5–5.1)
Sodium: 137 meq/L (ref 135–145)

## 2023-02-07 LAB — TSH: TSH: 8.16 u[IU]/mL — ABNORMAL HIGH (ref 0.35–5.50)

## 2023-02-11 ENCOUNTER — Other Ambulatory Visit: Payer: Self-pay | Admitting: Family Medicine

## 2023-02-26 ENCOUNTER — Ambulatory Visit (INDEPENDENT_AMBULATORY_CARE_PROVIDER_SITE_OTHER): Payer: PPO | Admitting: Family Medicine

## 2023-02-26 ENCOUNTER — Encounter: Payer: Self-pay | Admitting: Family Medicine

## 2023-02-26 VITALS — BP 133/61 | HR 63 | Temp 98.0°F | Ht 67.0 in | Wt 214.5 lb

## 2023-02-26 DIAGNOSIS — R7989 Other specified abnormal findings of blood chemistry: Secondary | ICD-10-CM | POA: Diagnosis not present

## 2023-02-26 DIAGNOSIS — R944 Abnormal results of kidney function studies: Secondary | ICD-10-CM

## 2023-02-26 DIAGNOSIS — R600 Localized edema: Secondary | ICD-10-CM

## 2023-02-26 DIAGNOSIS — I10 Essential (primary) hypertension: Secondary | ICD-10-CM | POA: Diagnosis not present

## 2023-02-26 DIAGNOSIS — E611 Iron deficiency: Secondary | ICD-10-CM | POA: Diagnosis not present

## 2023-02-26 DIAGNOSIS — D329 Benign neoplasm of meninges, unspecified: Secondary | ICD-10-CM | POA: Diagnosis not present

## 2023-02-26 NOTE — Patient Instructions (Addendum)
 Blood pressure is improved  BP: 133/61   Continue current medicines   Lab today for kidney function and cbc/platelets   Take care of yourself  Watch sodium/ sugar and fat in your diet

## 2023-02-26 NOTE — Assessment & Plan Note (Signed)
 No clinical changes No exam changes

## 2023-02-26 NOTE — Assessment & Plan Note (Signed)
 Improved with lasix  5 mg daily and triam hct 1/2 pill daily  Lab today

## 2023-02-26 NOTE — Assessment & Plan Note (Signed)
 Last plt count 548  She was sick at the time with viral uri  Re check today  No new bruising or bleeding (does have a new bruise on right hand from cat -not unusual)

## 2023-02-26 NOTE — Assessment & Plan Note (Signed)
 Now taking 1/2 of triam hct 37.5-25 mg daily  Last GFR was improved  Losartan  added last visit 50 mg daily  Lab today Blood pressure is fair

## 2023-02-26 NOTE — Progress Notes (Signed)
 Subjective:    Patient ID: Amber Miles, female    DOB: [birth year], 78 y.o.   MRN: [id number]  HPI  Wt Readings from Last 3 Encounters:  02/26/23 214 lb 8 oz (97.3 kg)  12/26/22 227 lb (103 kg)  11/14/22 230 lb (104.3 kg)   33.60 kg/m  Vitals:   02/26/23 1501 02/26/23 1526  BP: (!) 148/68 133/61  Pulse: 63   Temp: 98 F (36.7 C)   SpO2: 96%     Pt presents for follow up of HTN , GFR, ankle edema and elevated plt coung    bp is stable today  No cp or palpitations or headaches or edema  No side effects to medicines  BP Readings from Last 3 Encounters:  02/26/23 133/61  12/26/22 135/65  10/27/22 (!) 141/65    Last time noted chronic cough  We changed from lisinopril  5 mg daily to losartan  50 mg daily  Cough is improved but not gone and not that often   Ankles less swollen   Working on weight loss Eating less  Happy about that     Continues Triam hct 37.5-25 mg 2 tabs daily -has cut to 1/2 pill daily due to renal changes  Verapamil  xr 240 mg daily  Lasix  10 mg 1/2 pill daily -has been taking daily   Lab Results  Component Value Date   NA 137 02/06/2023   K 3.8 02/06/2023   CO2 27 02/06/2023   GLUCOSE 92 02/06/2023   BUN 16 02/06/2023   CREATININE 0.98 02/06/2023   CALCIUM 9.9 02/06/2023   GFR 55.45 (L) 02/06/2023   GFRNONAA 55 (L) 08/26/2012   GFR improved from 48.3   Lab Results  Component Value Date   WBC 10.1 02/06/2023   HGB 13.0 02/06/2023   HCT 39.6 02/06/2023   MCV 84.4 02/06/2023   PLT 548.0 (H) 02/06/2023   Plt ct was higher than usual  At time of draw was fighting a virus  No new excessive bruising or bleeding or clots   Is off zyrtec  Decreased her mucinex dose   Working on fluids  Well over 60 oz of water    Patient Active Problem List   Diagnosis Date Noted   History of UTI 10/27/2022   Elevated platelet count 10/15/2022   Hormone replacement therapy (HRT) 10/13/2022   Multiple thyroid  nodules 10/13/2022   Current  use of proton pump inhibitor 10/05/2022   Decreased GFR 06/19/2022   Hoarseness of voice 05/08/2022   Routine general medical examination at a health care facility 07/29/2019   Genetic testing 01/02/2018   Family history of breast cancer    Family history of genetic disease carrier    Meningioma (HCC) 07/17/2017   Fall 01/04/2017   Atrophic vaginitis 09/01/2015   Pedal edema 05/27/2014   Estrogen deficiency 02/21/2013   Encounter for Medicare annual wellness exam 02/21/2013   Low back pain potentially associated with radiculopathy 10/11/2012   Allergic rhinitis 01/28/2010   Prediabetes 07/30/2009   Anxiety state 11/30/2008   Hyperlipidemia 04/13/2008   Hypothyroidism 01/07/2008   HYPERTENSION, BENIGN 01/07/2008   OSTEOARTHRITIS, GENERALIZED 01/07/2008   Past Medical History:  Diagnosis Date   Allergy    allergic rhinitis   Anxiety    Arthritis    OA knee replacement   Cataract    Degenerative disc disease, thoracic    Family history of breast cancer    Family history of genetic disease carrier    paternal cousin has  NBN mutation   GERD (gastroesophageal reflux disease)    Hyperlipidemia    Hypertension    Hypothyroid    Reactive airways dysfunction syndrome (HCC)    Skin lesions, generalized    squamous cell skin lesions and basal cell skin lesions   Past Surgical History:  Procedure Laterality Date   ABDOMINAL HYSTERECTOMY  1990s   hyst with oophrectomy for B9lesion/cyst and prolapse   BREAST BIOPSY Bilateral    benign   JOINT REPLACEMENT  05/2009   left total knee replacement   recocele     and cystocele repair   TONSILLECTOMY     tsa     Social History   Tobacco Use   Smoking status: Never   Smokeless tobacco: Never  Vaping Use   Vaping status: Never Used  Substance Use Topics   Alcohol use: No    Alcohol/week: 0.0 standard drinks of alcohol   Drug use: No   Family History  Problem Relation Age of Onset   Hypertension Mother    Stroke Mother     Depression Mother    Alzheimer's disease Mother    Heart disease Father    Cerebral palsy Father    Hypertension Sister    Breast cancer Cousin 40       NBN mutation   Melanoma Maternal Uncle        dx >50   Allergies  Allergen Reactions   Neosporin [Neomycin -Polymyxin-Gramicidin] Other (See Comments)    blisters   Tape Swelling    Itching and redness Pt said severe reaction to surgical tape.? latex   Current Outpatient Medications on File Prior to Visit  Medication Sig Dispense Refill   acyclovir (ZOVIRAX) 800 MG tablet TAKE 1 TABLET BY MOUTH 3 TIMES DAILY FOR 3 DAYS FLARES  2   albuterol  (VENTOLIN  HFA) 108 (90 Base) MCG/ACT inhaler INHALE 1 TO 2 PUFFS EVERY 4 TO 6 HOURS AS NEEDED FOR WHEEZE OR COUGH 8.5 each 1   ARNUITY ELLIPTA 200 MCG/ACT AEPB Inhale 1 puff into the lungs daily.     azelastine (ASTELIN) 0.1 % nasal spray Place 1 spray into both nostrils 2 (two) times daily.     cetirizine (ZYRTEC) 5 MG tablet Take 5 mg by mouth daily as needed.     Docusate Calcium (STOOL SOFTENER PO) Take 1 tablet by mouth daily as needed.     esomeprazole  (NEXIUM ) 40 MG capsule TAKE 1 CAPSULE BY MOUTH EVERY DAY BEFORE BREAKFAST 90 capsule 1   estradiol  (VIVELLE -DOT) 0.0375 MG/24HR PLACE 1 PATCH ONTO THE SKIN 2 TIMES A WEEK. 24 patch 1   furosemide  (LASIX ) 20 MG tablet TAKE 1/2 TABLET BY MOUTH DAILY 45 tablet 1   GLUCOSAMINE-CHONDROITIN-VIT D3 PO Take 1 capsule by mouth 2 (two) times daily.     levothyroxine (SYNTHROID) 112 MCG tablet Take 112 mcg by mouth daily before breakfast.     losartan  (COZAAR ) 50 MG tablet Take 1 tablet (50 mg total) by mouth daily. 90 tablet 1   montelukast (SINGULAIR) 10 MG tablet Take 10 mg by mouth daily.      Multiple Vitamin (MULTIVITAMIN) tablet Take 1 tablet by mouth daily.       potassium chloride  SA (KLOR-CON  M20) 20 MEQ tablet TAKE 1 TABLET BY MOUTH EVERY DAY 90 tablet 3   tiZANidine  (ZANAFLEX ) 4 MG tablet TAKE 1 TABLET BY MOUTH EVERY 6 HOURS AS NEEDED 360  tablet 0   traMADol  (ULTRAM ) 50 MG tablet TAKE 1-2 TABLETS BY MOUTH EVERY  8 HOURS AS NEEDED FOR SEVERE(SHOULDER PAIN). CAUTION OF SEDATION 30 tablet 0   triamterene -hydrochlorothiazide (MAXZIDE-25) 37.5-25 MG tablet TAKE 2 TABLETS BY MOUTH EVERY DAY (Patient taking differently: Take 0.5 tablets by mouth daily.) 180 tablet 1   verapamil  (CALAN -SR) 240 MG CR tablet TAKE 1 TABLET (240 MG TOTAL) BY MOUTH 2 (TWO) TIMES DAILY. 180 tablet 2   No current facility-administered medications on file prior to visit.    Review of Systems  Constitutional:  Negative for activity change, appetite change, fatigue, fever and unexpected weight change.  HENT:  Negative for congestion, ear pain, rhinorrhea, sinus pressure and sore throat.   Eyes:  Negative for pain, redness and visual disturbance.  Respiratory:  Negative for cough, shortness of breath and wheezing.   Cardiovascular:  Negative for chest pain and palpitations.  Gastrointestinal:  Negative for abdominal pain, blood in stool, constipation and diarrhea.  Endocrine: Negative for polydipsia and polyuria.  Genitourinary:  Negative for dysuria, frequency and urgency.  Musculoskeletal:  Positive for arthralgias. Negative for back pain and myalgias.  Skin:  Negative for pallor and rash.  Allergic/Immunologic: Negative for environmental allergies.  Neurological:  Negative for dizziness, syncope and headaches.  Hematological:  Negative for adenopathy. Does not bruise/bleed easily.  Psychiatric/Behavioral:  Negative for decreased concentration and dysphoric mood. The patient is not nervous/anxious.        Objective:   Physical Exam Constitutional:      General: She is not in acute distress.    Appearance: Normal appearance. She is well-developed. She is obese. She is not ill-appearing or diaphoretic.  HENT:     Head: Normocephalic and atraumatic.  Eyes:     Conjunctiva/sclera: Conjunctivae normal.     Pupils: Pupils are equal, round, and reactive to  light.  Neck:     Thyroid : No thyromegaly.     Vascular: No carotid bruit or JVD.  Cardiovascular:     Rate and Rhythm: Normal rate and regular rhythm.     Heart sounds: Normal heart sounds.     No gallop.  Pulmonary:     Effort: Pulmonary effort is normal. No respiratory distress.     Breath sounds: Normal breath sounds. No wheezing or rales.  Abdominal:     General: There is no distension or abdominal bruit.     Palpations: Abdomen is soft.  Musculoskeletal:     Cervical back: Normal range of motion and neck supple.     Right lower leg: No edema.     Left lower leg: No edema.  Lymphadenopathy:     Cervical: No cervical adenopathy.  Skin:    General: Skin is warm and dry.     Coloration: Skin is not pale.     Findings: No rash.  Neurological:     Mental Status: She is alert.     Coordination: Coordination normal.     Deep Tendon Reflexes: Reflexes are normal and symmetric. Reflexes normal.  Psychiatric:        Mood and Affect: Mood normal.           Assessment & Plan:   Problem List Items Addressed This Visit       Cardiovascular and Mediastinum   HYPERTENSION, BENIGN - Primary   bp in fair control at this time  BP Readings from Last 1 Encounters:  02/26/23 133/61   No changes needed Will continue losartan  50 mg daily (less cough than with lisinopril ) Triam hct 37.5-25 mg 1/2 pill daily (watching renal numbers_ Verapamil   xr 240 mg daily  Lasix  10 mg 1/2 pill daily (helps swelling) Most recent labs reviewed  Disc lifstyle change with low sodium diet and exercise  Commended weight loss so far  Lab today for bmet        Relevant Orders   Basic metabolic panel   CBC with Differential/Platelet     Nervous and Auditory   Meningioma (HCC)   No clinical changes No exam changes         Other   Pedal edema   Improved with lasix  5 mg daily and triam hct 1/2 pill daily  Lab today      Elevated platelet count   Last plt count 548  She was sick at the  time with viral uri  Re check today  No new bruising or bleeding (does have a new bruise on right hand from cat -not unusual)         Relevant Orders   CBC with Differential/Platelet   Decreased GFR   Now taking 1/2 of triam hct 37.5-25 mg daily  Last GFR was improved  Losartan  added last visit 50 mg daily  Lab today Blood pressure is fair       Relevant Orders   Basic metabolic panel

## 2023-02-26 NOTE — Assessment & Plan Note (Signed)
 bp in fair control at this time  BP Readings from Last 1 Encounters:  02/26/23 133/61   No changes needed Will continue losartan  50 mg daily (less cough than with lisinopril ) Triam hct 37.5-25 mg 1/2 pill daily (watching renal numbers_ Verapamil  xr 240 mg daily  Lasix  10 mg 1/2 pill daily (helps swelling) Most recent labs reviewed  Disc lifstyle change with low sodium diet and exercise  Commended weight loss so far  Lab today for bmet

## 2023-02-27 LAB — BASIC METABOLIC PANEL
BUN: 14 mg/dL (ref 6–23)
CO2: 29 meq/L (ref 19–32)
Calcium: 9.8 mg/dL (ref 8.4–10.5)
Chloride: 98 meq/L (ref 96–112)
Creatinine, Ser: 0.87 mg/dL (ref 0.40–1.20)
GFR: 63.94 mL/min (ref >60.00)
Glucose, Bld: 92 mg/dL (ref 70–99)
Potassium: 4 meq/L (ref 3.5–5.1)
Sodium: 135 meq/L (ref 135–145)

## 2023-02-27 LAB — CBC WITH DIFFERENTIAL/PLATELET
Basophils Absolute: 0.1 10*3/uL (ref 0.0–0.1)
Basophils Relative: 1.1 % (ref 0.0–3.0)
Eosinophils Absolute: 0.3 10*3/uL (ref 0.0–0.7)
Eosinophils Relative: 2.8 % (ref 0.0–5.0)
HCT: 40.1 % (ref 36.0–46.0)
Hemoglobin: 13.2 g/dL (ref 12.0–15.0)
Lymphocytes Relative: 14.7 % (ref 12.0–46.0)
Lymphs Abs: 1.5 10*3/uL (ref 0.7–4.0)
MCHC: 33 g/dL (ref 30.0–36.0)
MCV: 84.2 fL (ref 78.0–100.0)
Monocytes Absolute: 1 10*3/uL (ref 0.1–1.0)
Monocytes Relative: 9.5 % (ref 3.0–12.0)
Neutro Abs: 7.2 10*3/uL (ref 1.4–7.7)
Neutrophils Relative %: 71.9 % (ref 43.0–77.0)
Platelets: 492 10*3/uL — ABNORMAL HIGH (ref 150.0–400.0)
RBC: 4.77 Mil/uL (ref 3.87–5.11)
RDW: 15.3 % (ref 11.5–15.5)
WBC: 10 10*3/uL (ref 4.0–10.5)

## 2023-02-28 ENCOUNTER — Ambulatory Visit (INDEPENDENT_AMBULATORY_CARE_PROVIDER_SITE_OTHER): Payer: PPO

## 2023-02-28 DIAGNOSIS — E611 Iron deficiency: Secondary | ICD-10-CM | POA: Diagnosis not present

## 2023-02-28 LAB — FERRITIN: Ferritin: 29.6 ng/mL (ref 10.0–291.0)

## 2023-02-28 LAB — IRON: Iron: 26 ug/dL — ABNORMAL LOW (ref 42–145)

## 2023-02-28 NOTE — Addendum Note (Signed)
Addended by: Alvina Chou on: 02/28/2023 07:31 AM   Modules accepted: Orders

## 2023-03-01 NOTE — Telephone Encounter (Signed)
Copied from CRM [id number]. Topic: General - Call Back - No Documentation >> Mar 01, 2023  4:13 PM Florestine Avers wrote: Reason for CRM: Patient states she had a missed call from Dr towers nurse. Patient is requesting a call back. She states in is in regards to a GI referral.

## 2023-03-04 DIAGNOSIS — E611 Iron deficiency: Secondary | ICD-10-CM | POA: Insufficient documentation

## 2023-03-04 NOTE — Addendum Note (Signed)
Addended by: Roxy Manns A on: 03/04/2023 11:48 AM   Modules accepted: Orders

## 2023-03-05 ENCOUNTER — Ambulatory Visit: Payer: Self-pay | Admitting: Family Medicine

## 2023-03-05 NOTE — Telephone Encounter (Signed)
Chief Complaint: Cough Symptoms: Productive cough, runny nose, sputum yellow, green at times Frequency: Onset Saturday, comes and goes  Pertinent Negatives: Patient denies fever, SOB, chest pain  Disposition: [] ED /[] Urgent Care (no appt availability in office) / [] Appointment(In office/virtual)/ []  [place] [facility]/ [x] Home Care/ [] Refused Recommended Disposition /[] [place] [facility]/ []  Follow-up with PCP Additional Notes: Patient states she started coughing up yellow and green sputum on Saturday and today her symptoms are much better with a mild cough. Patient wanted to know if she needs an antibiotic since the sputum was yellow on Saturday. Care advice was given and patient was advised to callback if symptoms got worse. Patient verbalized understanding. Patient currently reporting a mild cough and runny nose.  Patient reports a history of a mild cough and Asthma.  Copied from CRM [id number]. Topic: Clinical - Red Word Triage >> Mar 05, 2023 10:32 AM Dimitri Ped wrote: [facility] that prompted transfer to Nurse Triage: patient was see4n last week and over weekend was coughing up green and yellow stuff. Patient is wanting to see if doctor can call her in something Reason for Disposition  Cough  Answer Assessment - Initial Assessment Questions 1. ONSET: "When did the cough begin?"      Saturday 2. SEVERITY: "How bad is the cough today?"      Mild  3. SPUTUM: "Describe the color of your sputum" (none, dry cough; clear, white, yellow, green)     Yellow and Green at times  4. HEMOPTYSIS: "Are you coughing up any blood?" If so ask: "How much?" (flecks, streaks, tablespoons, etc.)     No  5. DIFFICULTY BREATHING: "Are you having difficulty breathing?" If Yes, ask: "How bad is it?" (e.g., mild, moderate, severe)    - MILD: No SOB at rest, mild SOB with walking, speaks normally in sentences, can lie down, no retractions, pulse < 100.    - MODERATE: SOB at rest, SOB with minimal exertion and  prefers to sit, cannot lie down flat, speaks in phrases, mild retractions, audible wheezing, pulse 100-120.    - SEVERE: Very SOB at rest, speaks in single words, struggling to breathe, sitting hunched forward, retractions, pulse > 120      No  6. FEVER: "Do you have a fever?" If Yes, ask: "What is your temperature, how was it measured, and when did it start?"     No  7. CARDIAC HISTORY: "Do you have any history of heart disease?" (e.g., heart attack, congestive heart failure)      N/A 8. LUNG HISTORY: "Do you have any history of lung disease?"  (e.g., pulmonary embolus, asthma, emphysema)     Asthma  9. PE RISK FACTORS: "Do you have a history of blood clots?" (or: recent major surgery, recent prolonged travel, bedridden)     No  10. OTHER SYMPTOMS: "Do you have any other symptoms?" (e.g., runny nose, wheezing, chest pain)       Runny nose  12. TRAVEL: "Have you traveled out of the country in the last month?" (e.g., travel history, exposures)       No  Protocols used: Cough - Acute Productive-A-AH

## 2023-03-13 ENCOUNTER — Ambulatory Visit: Payer: PPO | Admitting: Family

## 2023-03-13 ENCOUNTER — Ambulatory Visit: Payer: Self-pay | Admitting: Family Medicine

## 2023-03-13 ENCOUNTER — Telehealth: Payer: Self-pay | Admitting: Family Medicine

## 2023-03-13 MED ORDER — ONDANSETRON 8 MG PO TBDP
8.0000 mg | ORAL_TABLET | Freq: Three times a day (TID) | ORAL | 0 refills | Status: DC | PRN
Start: 2023-03-13 — End: 2023-09-25

## 2023-03-13 NOTE — Telephone Encounter (Signed)
 Will route to [place] to review, not sure what prev message meant. Please see separate nurse triage note on pt

## 2023-03-13 NOTE — Telephone Encounter (Signed)
 Patient missed her 12:30pm appointment do to front office getting the CRM too late. Patient was called and advised to go to urgent care, at time of call no appointments available at office. Patient stated she would go to Urgent care.  See triage note

## 2023-03-13 NOTE — Telephone Encounter (Signed)
 Aware, will watch for correspondence Appreciated

## 2023-03-13 NOTE — Telephone Encounter (Signed)
 Pt notified of Dr. Royden Purl comments. She is going to pick up the zofran, given how late it is she declined to go to [facility] and would like to see a provider here, appt scheduled with Dr. Patsy Lager tomorrow. Will route to him as a Financial planner

## 2023-03-13 NOTE — Telephone Encounter (Signed)
 Sorry to hear about this and that her appointment was cancelled  I do want her to go to UC when able (if necessary will have to hold bucket in car for vomiting)  I will send in some zofran to help the vomiting in the meantime Please keep me updated

## 2023-03-13 NOTE — Addendum Note (Signed)
 Addended by: Roxy Manns A on: 03/13/2023 04:27 PM   Modules accepted: Orders

## 2023-03-13 NOTE — Telephone Encounter (Signed)
 Chief Complaint: Abdominal pain Symptoms: Abdominal pain 9/10, nausea, body aches, headache, fatigue, chills Frequency: constant since last night  Pertinent Negatives: Patient denies fever, chest pain, vomiting Disposition: [] ED /[] Urgent Care (no appt availability in office) / [x] Appointment(In office/virtual)/ []  [place] [facility]/ [] Home Care/ [] Refused Recommended Disposition /[] [place] [facility]/ []  Follow-up with PCP Additional Notes: Patient reports abdominal pain that started last night. Patient took tylenol and tramadol without much improvement. Patient states she felt like this before in the past when she had the flu. Care advice was given and appointment scheduled at [facility] station today.   Copied from CRM [id number]. Topic: Clinical - Red Word Triage >> Mar 13, 2023  8:40 AM Amber Miles wrote: Red Word that prompted transfer to Nurse Triage: Possible flu, sick on stomach (abdomen hurt, muscle hurt) hurt to move. Reason for Disposition  [1] MODERATE pain (e.g., interferes with normal activities) AND [2] pain comes and goes (cramps) AND [3] present > 24 hours  (Exception: Pain with Vomiting or Diarrhea - see that Guideline.)  Answer Assessment - Initial Assessment Questions 1. LOCATION: "Where does it hurt?"      All over  2. RADIATION: "Does the pain shoot anywhere else?" (e.g., chest, back)     Yes my whole body hurts  3. ONSET: "When did the pain begin?" (e.g., minutes, hours or days ago)      Last night  4. SUDDEN: "Gradual or sudden onset?"     Gradual  5. PATTERN "Does the pain come and go, or is it constant?"    - If it comes and goes: "How long does it last?" "Do you have pain now?"     (Note: Comes and goes means the pain is intermittent. It goes away completely between bouts.)    - If constant: "Is it getting better, staying the same, or getting worse?"      (Note: Constant means the pain never goes away completely; most serious pain is constant and gets  worse.)      Constant 6. SEVERITY: "How bad is the pain?"  (e.g., Scale 1-10; mild, moderate, or severe)    - MILD (1-3): Doesn't interfere with normal activities, abdomen soft and not tender to touch.     - MODERATE (4-7): Interferes with normal activities or awakens from sleep, abdomen tender to touch.     - SEVERE (8-10): Excruciating pain, doubled over, unable to do any normal activities.       9/10 7. RECURRENT SYMPTOM: "Have you ever had this type of stomach pain before?" If Yes, ask: "When was the last time?" and "What happened that time?"      Yes, I had the flu a along time  8. CAUSE: "What do you think is causing the stomach pain?"     This feels like the flu  9. RELIEVING/AGGRAVATING FACTORS: "What makes it better or worse?" (e.g., antacids, bending or twisting motion, bowel movement)     Nothing  10. OTHER SYMPTOMS: "Do you have any other symptoms?" (e.g., back pain, diarrhea, fever, urination pain, vomiting)       Nausea, headache, hot flash, back pain, abdominal cramps  Protocols used: Abdominal Pain - Female-A-AH

## 2023-03-13 NOTE — Telephone Encounter (Signed)
 Appt scheduled at [facility] FYI to PCP

## 2023-03-13 NOTE — Telephone Encounter (Signed)
 Copied from CRM [id number]. Topic: Clinical - Red Word Triage >> Mar 13, 2023  8:40 AM Amber Miles wrote: Red Word that prompted transfer to Nurse Triage: Possible flu, sick on stomach (abdomen hurt, muscle hurt) hurt to move.   Chief Complaint: Flu like symptoms  Symptoms: Nausea, vomiting, diarrhea headache, cough, body aches, hot flash, back pain, abdominal cramps Frequency: Frequent  Pertinent Negatives: Patient denies difficulty breathing  Disposition: [] ED /[] Urgent Care (no appt availability in office) / [] Appointment(In office/virtual)/ []  [place] [facility]/ [] Home Care/ [] Refused Recommended Disposition /[] [place] [facility]/ [x]  Follow-up with PCP Additional Notes: Patient reports flu like symptoms. She states that she had an appointment today but was not able to be seen due to a problem with scheduling. Patient states that she was going to go to urgent care but began to vomit and did not want to leave the house. She states that she will send her daughter to get her some Theraflu but would like a prescription sent to the pharmacy to help with her symptoms if possible. I advised the patient that I could send a request but could not guarantee that a prescription would be sent. She verbalized understanding of this. Patient would like a call with a response to her request if possible.      Reason for Disposition  Patient is HIGH RISK (e.g., age > 64 years, pregnant, HIV+, or chronic medical condition)  Answer Assessment - Initial Assessment Questions 1. WORST SYMPTOM: "What is your worst symptom?" (e.g., cough, runny nose, muscle aches, headache, sore throat, fever)      Body aches  2. ONSET: "When did your flu symptoms start?"      Last night  3. COUGH: "How bad is the cough?"       Mild cough  4. RESPIRATORY DISTRESS: "Describe your breathing."      No 5. FEVER: "Do you have a fever?" If Yes, ask: "What is your temperature, how was it measured, and when did it start?"      Fever last night  6. EXPOSURE: "Were you exposed to someone with influenza?"       Possibly at church  7. FLU VACCINE: "Did you get a flu shot this year?"     Yes 8. HIGH RISK DISEASE: "Do you have any chronic medical problems?" (e.g., heart or lung disease, asthma, weak immune system, or other HIGH RISK conditions)       Asthma  10. OTHER SYMPTOMS: "Do you have any other symptoms?"  (e.g., runny nose, muscle aches, headache, sore throat)       Nausea, headache, hot flash, back pain, abdominal cramps, diarrhea, vomiting  Protocols used: Influenza (Flu) - [facility]

## 2023-03-14 ENCOUNTER — Ambulatory Visit: Payer: PPO | Admitting: Family Medicine

## 2023-03-14 ENCOUNTER — Encounter: Payer: Self-pay | Admitting: Internal Medicine

## 2023-03-14 ENCOUNTER — Ambulatory Visit (INDEPENDENT_AMBULATORY_CARE_PROVIDER_SITE_OTHER): Payer: PPO | Admitting: Internal Medicine

## 2023-03-14 VITALS — BP 120/80 | HR 80 | Temp 98.3°F | Ht 67.0 in | Wt 214.0 lb

## 2023-03-14 DIAGNOSIS — K529 Noninfective gastroenteritis and colitis, unspecified: Secondary | ICD-10-CM | POA: Diagnosis not present

## 2023-03-14 DIAGNOSIS — R509 Fever, unspecified: Secondary | ICD-10-CM | POA: Diagnosis not present

## 2023-03-14 LAB — POCT INFLUENZA A/B
Influenza A, POC: NEGATIVE
Influenza B, POC: NEGATIVE

## 2023-03-14 NOTE — Progress Notes (Signed)
 Subjective:    Patient ID: Amber Miles, female    DOB: 03-13-1945, 78 y.o.   MRN: [id number]  HPI Here due to illness  Started 2.5 days ago Pain all over--especially abdomen and back Also with frontal headache Fever the first night--not as bad last night No sore throat or ear pain No congestion--just some cough she relates to asthma (better now) Vomiting yesterday---had to cancel visit from yesterday Took ondansetron---made her dizzy Nausea still this morning---able to take crackers and pedialyte No other dizziness   No other meds for this No known ill exposures  Current Outpatient Medications on File Prior to Visit  Medication Sig Dispense Refill   acyclovir (ZOVIRAX) 800 MG tablet TAKE 1 TABLET BY MOUTH 3 TIMES DAILY FOR 3 DAYS FLARES  2   albuterol (VENTOLIN HFA) 108 (90 Base) MCG/ACT inhaler INHALE 1 TO 2 PUFFS EVERY 4 TO 6 HOURS AS NEEDED FOR WHEEZE OR COUGH 8.5 each 1   ARNUITY ELLIPTA 200 MCG/ACT AEPB Inhale 1 puff into the lungs daily.     azelastine (ASTELIN) 0.1 % nasal spray Place 1 spray into both nostrils 2 (two) times daily.     cetirizine (ZYRTEC) 5 MG tablet Take 5 mg by mouth daily as needed.     Docusate Calcium (STOOL SOFTENER PO) Take 1 tablet by mouth daily as needed.     esomeprazole (NEXIUM) 40 MG capsule TAKE 1 CAPSULE BY MOUTH EVERY DAY BEFORE BREAKFAST 90 capsule 1   estradiol (VIVELLE-DOT) 0.0375 MG/24HR PLACE 1 PATCH ONTO THE SKIN 2 TIMES A WEEK. 24 patch 1   furosemide (LASIX) 20 MG tablet TAKE 1/2 TABLET BY MOUTH DAILY 45 tablet 1   GLUCOSAMINE-CHONDROITIN-VIT D3 PO Take 1 capsule by mouth 2 (two) times daily.     levothyroxine (SYNTHROID) 112 MCG tablet Take 112 mcg by mouth daily before breakfast.     losartan (COZAAR) 50 MG tablet Take 1 tablet (50 mg total) by mouth daily. 90 tablet 1   montelukast (SINGULAIR) 10 MG tablet Take 10 mg by mouth daily.      Multiple Vitamin (MULTIVITAMIN) tablet Take 1 tablet by mouth daily.       ondansetron  (ZOFRAN-ODT) 8 MG disintegrating tablet Take 1 tablet (8 mg total) by mouth every 8 (eight) hours as needed for nausea or vomiting. Caution of sedation 5 tablet 0   potassium chloride SA (KLOR-CON M20) 20 MEQ tablet TAKE 1 TABLET BY MOUTH EVERY DAY 90 tablet 3   tiZANidine (ZANAFLEX) 4 MG tablet TAKE 1 TABLET BY MOUTH EVERY 6 HOURS AS NEEDED 360 tablet 0   traMADol (ULTRAM) 50 MG tablet TAKE 1-2 TABLETS BY MOUTH EVERY 8 HOURS AS NEEDED FOR SEVERE(SHOULDER PAIN). CAUTION OF SEDATION 30 tablet 0   triamterene-hydrochlorothiazide (MAXZIDE-25) 37.5-25 MG tablet TAKE 2 TABLETS BY MOUTH EVERY DAY (Patient taking differently: Take 0.5 tablets by mouth daily.) 180 tablet 1   verapamil (CALAN-SR) 240 MG CR tablet TAKE 1 TABLET (240 MG TOTAL) BY MOUTH 2 (TWO) TIMES DAILY. 180 tablet 2   No current facility-administered medications on file prior to visit.    Allergies  Allergen Reactions   Neosporin [Neomycin-Polymyxin-Gramicidin] Other (See Comments)    blisters   Tape Swelling    Itching and redness Pt said severe reaction to surgical tape.? latex    Past Medical History:  Diagnosis Date   Allergy    allergic rhinitis   Anxiety    Arthritis    OA knee replacement  Cataract    Degenerative disc disease, thoracic    Family history of breast cancer    Family history of genetic disease carrier    paternal cousin has NBN mutation   GERD (gastroesophageal reflux disease)    Hyperlipidemia    Hypertension    Hypothyroid    Reactive airways dysfunction syndrome (HCC)    Skin lesions, generalized    squamous cell skin lesions and basal cell skin lesions    Past Surgical History:  Procedure Laterality Date   ABDOMINAL HYSTERECTOMY  1990s   hyst with oophrectomy for B9lesion/cyst and prolapse   BREAST BIOPSY Bilateral    benign   JOINT REPLACEMENT  05/2009   left total knee replacement   recocele     and cystocele repair   TONSILLECTOMY     tsa      Family History  Problem Relation  Age of Onset   Hypertension Mother    Stroke Mother    Depression Mother    Alzheimer's disease Mother    Heart disease Father    Cerebral palsy Father    Hypertension Sister    Breast cancer Cousin 21       NBN mutation   Melanoma Maternal Uncle        dx >50    Social History   Socioeconomic History   Marital status: Widowed    Spouse name: Not on file   Number of children: Not on file   Years of education: Not on file   Highest education level: Not on file  Occupational History   Not on file  Tobacco Use   Smoking status: Never   Smokeless tobacco: Never  Vaping Use   Vaping status: Never Used  Substance and Sexual Activity   Alcohol use: No    Alcohol/week: 0.0 standard drinks of alcohol   Drug use: No   Sexual activity: Not Currently  Other Topics Concern   Not on file  Social History Narrative   Not on file   Social Drivers of Health   Financial Resource Strain: Low Risk  (08/08/2022)   Overall Financial Resource Strain (CARDIA)    Difficulty of Paying Living Expenses: Not hard at all  Food Insecurity: Low Risk  (12/06/2022)   Received from [facility]   Hunger Vital Sign    Worried About Running Out of Food in the Last Year: Never true    Ran Out of Food in the Last Year: Never true  Transportation Needs: No Transportation Needs (12/06/2022)   Received from [facility]    In the past 12 months, has lack of reliable transportation kept you from medical appointments, meetings, work or from getting things needed for daily living? : No  Physical Activity: Sufficiently Active (08/08/2022)   Exercise Vital Sign    Days of Exercise per Week: 7 days    Minutes of Exercise per Session: 60 min  Stress: No Stress Concern Present (08/08/2022)   [employer] of Occupational Health - Occupational Stress Questionnaire    Feeling of Stress : Not at all  Social Connections: Moderately Integrated (08/08/2022)   Social Connection and Isolation  Panel [NHANES]    Frequency of Communication with Friends and Family: More than three times a week    Frequency of Social Gatherings with Friends and Family: More than three times a week    Attends Religious Services: More than 4 times per year    Active Member of [employer] or Organizations: Yes  Attends Banker Meetings: More than 4 times per year    Marital Status: Widowed  Intimate Partner Violence: Not At Risk (08/08/2022)   Humiliation, Afraid, Rape, and Kick questionnaire    Fear of Current or Ex-Partner: No    Emotionally Abused: No    Physically Abused: No    Sexually Abused: No   Review of Systems Loose stool yesterday --just one. None today No dysuria or urinary frequency/urgency    Objective:   Physical Exam Constitutional:      Comments: No distress  Cardiovascular:     Rate and Rhythm: Normal rate and regular rhythm.     Heart sounds: No murmur heard.    No gallop.  Pulmonary:     Effort: Pulmonary effort is normal.     Breath sounds: Normal breath sounds. No wheezing or rales.  Abdominal:     General: Bowel sounds are normal.     Palpations: Abdomen is soft.     Tenderness: There is no abdominal tenderness. There is no guarding.  Musculoskeletal:     Cervical back: Neck supple.  Lymphadenopathy:     Cervical: No cervical adenopathy.  Neurological:     Mental Status: She is alert.            Assessment & Plan:

## 2023-03-14 NOTE — Addendum Note (Signed)
 Addended by: Eual Fines on: 03/14/2023 11:46 AM   Modules accepted: Orders

## 2023-03-14 NOTE — Assessment & Plan Note (Signed)
 Probably self limited viral infection Flu negative Some better now Use the ondansetron only if sig nausea---since dizziness Slowly increase eating

## 2023-03-15 ENCOUNTER — Ambulatory Visit (INDEPENDENT_AMBULATORY_CARE_PROVIDER_SITE_OTHER): Payer: PPO | Admitting: Internal Medicine

## 2023-03-15 ENCOUNTER — Encounter: Payer: Self-pay | Admitting: Internal Medicine

## 2023-03-15 ENCOUNTER — Ambulatory Visit: Payer: Self-pay | Admitting: Family Medicine

## 2023-03-15 VITALS — BP 92/60 | HR 76 | Temp 98.5°F | Ht 67.0 in | Wt 214.0 lb

## 2023-03-15 DIAGNOSIS — L98499 Non-pressure chronic ulcer of skin of other sites with unspecified severity: Secondary | ICD-10-CM | POA: Insufficient documentation

## 2023-03-15 DIAGNOSIS — L98491 Non-pressure chronic ulcer of skin of other sites limited to breakdown of skin: Secondary | ICD-10-CM | POA: Diagnosis not present

## 2023-03-15 DIAGNOSIS — L98A399 Non-pressure chronic ulcer of unspecified hand with unspecified severity: Secondary | ICD-10-CM | POA: Insufficient documentation

## 2023-03-15 MED ORDER — SILVER SULFADIAZINE 1 % EX CREA: 1.0000 | TOPICAL_CREAM | Freq: Every day | CUTANEOUS | 0 refills | Status: DC

## 2023-03-15 NOTE — Telephone Encounter (Signed)
 Copied from CRM [id number]. Topic: Clinical - Red Word Triage >> Mar 15, 2023  8:14 AM Gurney Maxin H wrote: [facility] that prompted transfer to Nurse Triage: Larey Seat yesterday, skin off hand by thumb, head had a spot that was bleeding but not hurting.   Chief Complaint: Fall Symptoms: Left hand skin tear near anterior side of base of thumb and possibly hit her right side of her face or head due to her glasses being broken on the right side Frequency: last night 03/15/2023 around 11 pm Pertinent Negatives: Patient denies any pain, bleeding, weakness, dizziness, fever Disposition: [] ED /[] Urgent Care (no appt availability in office) / [x] Appointment(In office/virtual)/ []  [facility]/ [] Home Care/ [] Refused Recommended Disposition /[] [facility]/ []  Follow-up with PCP Additional Notes: Patient called and advised that last night she was getting out of bed and slipped on a cat toy.  She was unable to get off the floor on her own due to her shoes, socks, and feet all being too slick on the hardwood floor.  Patient's daughter came to help her and they ended up calling the Fire Department to come assist her off the floor. They wrapped her hand due to a skin tear with bleeding on the anterior side of her left hand at the base of her thumb.  She states that she must have hit the right side of her head/face due to her glasses being broken.  She denies any pain at this time and her daughter & fire department personnel stated that her face looked fine last night. She denies any fever, dizziness, weakness, bleeding, or pain at this time. She states that she fell back in September and her tetanus shot is up to date from that fall.  She came to her PCP office for evaluation after that fall as well. Appointment made for today 03/15/2023 at 10:45 am with Dr Alphonsus Sias at her PCP office. Patient is advised that if anything changes or worsens to go to the emergency room.  Patient verbalized understanding.  Reason  for Disposition  [1] Falling (two or more falls) AND [2] in past year  Answer Assessment - Initial Assessment Questions 1. MECHANISM: "How did the fall happen?"     Slipped on cat toy getting out of bed 2. DOMESTIC VIOLENCE AND ELDER ABUSE SCREENING: "Did you fall because someone pushed you or tried to hurt you?" If Yes, ask: "Are you safe now?"     N/A 3. ONSET: "When did the fall happen?" (e.g., minutes, hours, or days ago)     Last night at 11pm 4. LOCATION: "What part of the body hit the ground?" (e.g., back, buttocks, head, hips, knees, hands, head, stomach)     Left hand  5. INJURY: "Did you hurt (injure) yourself when you fell?" If Yes, ask: "What did you injure? Tell me more about this?" (e.g., body area; type of injury; pain severity)"     Left hand cut and right part of glasses was bent due to right side of head hitting ground  6. PAIN: "Is there any pain?" If Yes, ask: "How bad is the pain?" (e.g., Scale 1-10; or mild,  moderate, severe)   - NONE (0): No pain   - MILD (1-3): Doesn't interfere with normal activities    - MODERATE (4-7): Interferes with normal activities or awakens from sleep    - SEVERE (8-10): Excruciating pain, unable to do any normal activities      0 7. SIZE: For cuts, bruises, or  swelling, ask: "How large is it?" (e.g., inches or centimeters)      Palm side at the bottom of her left thumb 9. OTHER SYMPTOMS: "Do you have any other symptoms?" (e.g., dizziness, fever, weakness; new onset or worsening).      No 10. CAUSE: "What do you think caused the fall (or falling)?" (e.g., tripped, dizzy spell)       Slipped  Protocols used: Falls and [facility]

## 2023-03-15 NOTE — Progress Notes (Signed)
 Subjective:    Patient ID: Amber Miles, female    DOB: Jun 23, 1945, 78 y.o.   MRN: [id number]  HPI Here with daughter to check hand injury after fall last night  Intestinal problems are better Got up from recliner and stepped on cat toy---rolled over it and fell Hit head and broke glasses---but no headache or other neuro symptoms Hit left had on hardwood floor--ripped off skin Bad bleeding---other daughter finally got it stopped Rescue came--put on dressing Only hurts when moving thumb  Current Outpatient Medications on File Prior to Visit  Medication Sig Dispense Refill   acyclovir (ZOVIRAX) 800 MG tablet TAKE 1 TABLET BY MOUTH 3 TIMES DAILY FOR 3 DAYS FLARES  2   albuterol (VENTOLIN HFA) 108 (90 Base) MCG/ACT inhaler INHALE 1 TO 2 PUFFS EVERY 4 TO 6 HOURS AS NEEDED FOR WHEEZE OR COUGH 8.5 each 1   ARNUITY ELLIPTA 200 MCG/ACT AEPB Inhale 1 puff into the lungs daily.     azelastine (ASTELIN) 0.1 % nasal spray Place 1 spray into both nostrils 2 (two) times daily.     cetirizine (ZYRTEC) 5 MG tablet Take 5 mg by mouth daily as needed.     Docusate Calcium (STOOL SOFTENER PO) Take 1 tablet by mouth daily as needed.     esomeprazole (NEXIUM) 40 MG capsule TAKE 1 CAPSULE BY MOUTH EVERY DAY BEFORE BREAKFAST 90 capsule 1   estradiol (VIVELLE-DOT) 0.0375 MG/24HR PLACE 1 PATCH ONTO THE SKIN 2 TIMES A WEEK. 24 patch 1   furosemide (LASIX) 20 MG tablet TAKE 1/2 TABLET BY MOUTH DAILY 45 tablet 1   GLUCOSAMINE-CHONDROITIN-VIT D3 PO Take 1 capsule by mouth 2 (two) times daily.     levothyroxine (SYNTHROID) 112 MCG tablet Take 112 mcg by mouth daily before breakfast.     losartan (COZAAR) 50 MG tablet Take 1 tablet (50 mg total) by mouth daily. 90 tablet 1   montelukast (SINGULAIR) 10 MG tablet Take 10 mg by mouth daily.      Multiple Vitamin (MULTIVITAMIN) tablet Take 1 tablet by mouth daily.       ondansetron (ZOFRAN-ODT) 8 MG disintegrating tablet Take 1 tablet (8 mg total) by mouth every 8  (eight) hours as needed for nausea or vomiting. Caution of sedation 5 tablet 0   potassium chloride SA (KLOR-CON M20) 20 MEQ tablet TAKE 1 TABLET BY MOUTH EVERY DAY 90 tablet 3   tiZANidine (ZANAFLEX) 4 MG tablet TAKE 1 TABLET BY MOUTH EVERY 6 HOURS AS NEEDED 360 tablet 0   traMADol (ULTRAM) 50 MG tablet TAKE 1-2 TABLETS BY MOUTH EVERY 8 HOURS AS NEEDED FOR SEVERE(SHOULDER PAIN). CAUTION OF SEDATION 30 tablet 0   triamterene-hydrochlorothiazide (MAXZIDE-25) 37.5-25 MG tablet TAKE 2 TABLETS BY MOUTH EVERY DAY (Patient taking differently: Take 0.5 tablets by mouth daily.) 180 tablet 1   verapamil (CALAN-SR) 240 MG CR tablet TAKE 1 TABLET (240 MG TOTAL) BY MOUTH 2 (TWO) TIMES DAILY. 180 tablet 2   No current facility-administered medications on file prior to visit.    Allergies  Allergen Reactions   Neosporin [Neomycin-Polymyxin-Gramicidin] Other (See Comments)    blisters   Tape Swelling    Itching and redness Pt said severe reaction to surgical tape.? latex    Past Medical History:  Diagnosis Date   Allergy    allergic rhinitis   Anxiety    Arthritis    OA knee replacement   Cataract    Degenerative disc disease, thoracic    Family history  of breast cancer    Family history of genetic disease carrier    paternal cousin has NBN mutation   GERD (gastroesophageal reflux disease)    Hyperlipidemia    Hypertension    Hypothyroid    Reactive airways dysfunction syndrome (HCC)    Skin lesions, generalized    squamous cell skin lesions and basal cell skin lesions    Past Surgical History:  Procedure Laterality Date   ABDOMINAL HYSTERECTOMY  1990s   hyst with oophrectomy for B9lesion/cyst and prolapse   BREAST BIOPSY Bilateral    benign   JOINT REPLACEMENT  05/2009   left total knee replacement   recocele     and cystocele repair   TONSILLECTOMY     tsa      Family History  Problem Relation Age of Onset   Hypertension Mother    Stroke Mother    Depression Mother     Alzheimer's disease Mother    Heart disease Father    Cerebral palsy Father    Hypertension Sister    Breast cancer Cousin 4       NBN mutation   Melanoma Maternal Uncle        dx >50    Social History   Socioeconomic History   Marital status: Widowed    Spouse name: Not on file   Number of children: Not on file   Years of education: Not on file   Highest education level: Not on file  Occupational History   Not on file  Tobacco Use   Smoking status: Never   Smokeless tobacco: Never  Vaping Use   Vaping status: Never Used  Substance and Sexual Activity   Alcohol use: No    Alcohol/week: 0.0 standard drinks of alcohol   Drug use: No   Sexual activity: Not Currently  Other Topics Concern   Not on file  Social History Narrative   Not on file   Social Drivers of Health   Financial Resource Strain: Low Risk  (08/08/2022)   Overall Financial Resource Strain (CARDIA)    Difficulty of Paying Living Expenses: Not hard at all  Food Insecurity: Low Risk  (12/06/2022)   Received from [facility]   Hunger Vital Sign    Worried About Running Out of Food in the Last Year: Never true    Ran Out of Food in the Last Year: Never true  Transportation Needs: No Transportation Needs (12/06/2022)   Received from [facility]    In the past 12 months, has lack of reliable transportation kept you from medical appointments, meetings, work or from getting things needed for daily living? : No  Physical Activity: Sufficiently Active (08/08/2022)   Exercise Vital Sign    Days of Exercise per Week: 7 days    Minutes of Exercise per Session: 60 min  Stress: No Stress Concern Present (08/08/2022)   [employer] of Occupational Health - Occupational Stress Questionnaire    Feeling of Stress : Not at all  Social Connections: Moderately Integrated (08/08/2022)   Social Connection and Isolation Panel [NHANES]    Frequency of Communication with Friends and Family: More than  three times a week    Frequency of Social Gatherings with Friends and Family: More than three times a week    Attends Religious Services: More than 4 times per year    Active Member of [employer] or Organizations: Yes    Attends Banker Meetings: More than 4 times per  year    Marital Status: Widowed  Intimate Partner Violence: Not At Risk (08/08/2022)   Humiliation, Afraid, Rape, and Kick questionnaire    Fear of Current or Ex-Partner: No    Emotionally Abused: No    Physically Abused: No    Sexually Abused: No   Review of Systems    Objective:   Physical Exam Skin:    Comments: ~3 x 3 cm skin tear over left thenar emminence Devitalized skin attached No infection Not bleeding now            Assessment & Plan:

## 2023-03-15 NOTE — Assessment & Plan Note (Signed)
 With avulsed skin Discussed alternatives--verbal consent Devitalized skin removed with sterile scissors Redressed with silvadene, dry dressing and coban Discussed home care (daily change till scab is in place)

## 2023-03-15 NOTE — Telephone Encounter (Signed)
 Okay--I will assess her at today's visit

## 2023-03-21 NOTE — Progress Notes (Unsigned)
 Chief Complaint: Iron deficiency anemia Primary GI Doctor:***  HPI:  Patient is a  78  year old female patient with past medical history of anxiety, GERD, hypertension, hyperlipidemia,*****who was referred to me by Judy Pimple, MD on 03/04/2023 for a complaint of iron deficiency anemia.    Interval History  Patient admits/denies GERD Patient admits/denies dysphagia Patient admits/denies nausea, vomiting, or weight loss  Patient admits/denies altered bowel habits Patient admits/denies abdominal pain Patient admits/denies rectal bleeding   Denies/Admits alcohol Denies/Admits smoking Denies/Admits NSAID use. Denies/Admits they are on blood thinners.  Patients last colonoscopy Patients last EGD  Patient's family history includes  Wt Readings from Last 3 Encounters:  03/15/23 214 lb (97.1 kg)  03/14/23 214 lb (97.1 kg)  02/26/23 214 lb 8 oz (97.3 kg)      Past Medical History:  Diagnosis Date   Allergy    allergic rhinitis   Anxiety    Arthritis    OA knee replacement   Cataract    Degenerative disc disease, thoracic    Family history of breast cancer    Family history of genetic disease carrier    paternal cousin has NBN mutation   GERD (gastroesophageal reflux disease)    Hyperlipidemia    Hypertension    Hypothyroid    Reactive airways dysfunction syndrome (HCC)    Skin lesions, generalized    squamous cell skin lesions and basal cell skin lesions    Past Surgical History:  Procedure Laterality Date   ABDOMINAL HYSTERECTOMY  1990s   hyst with oophrectomy for B9lesion/cyst and prolapse   BREAST BIOPSY Bilateral    benign   JOINT REPLACEMENT  05/2009   left total knee replacement   recocele     and cystocele repair   TONSILLECTOMY     tsa      Current Outpatient Medications  Medication Sig Dispense Refill   acyclovir (ZOVIRAX) 800 MG tablet TAKE 1 TABLET BY MOUTH 3 TIMES DAILY FOR 3 DAYS FLARES  2   albuterol (VENTOLIN HFA) 108 (90 Base)  MCG/ACT inhaler INHALE 1 TO 2 PUFFS EVERY 4 TO 6 HOURS AS NEEDED FOR WHEEZE OR COUGH 8.5 each 1   ARNUITY ELLIPTA 200 MCG/ACT AEPB Inhale 1 puff into the lungs daily.     azelastine (ASTELIN) 0.1 % nasal spray Place 1 spray into both nostrils 2 (two) times daily.     cetirizine (ZYRTEC) 5 MG tablet Take 5 mg by mouth daily as needed.     Docusate Calcium (STOOL SOFTENER PO) Take 1 tablet by mouth daily as needed.     esomeprazole (NEXIUM) 40 MG capsule TAKE 1 CAPSULE BY MOUTH EVERY DAY BEFORE BREAKFAST 90 capsule 1   estradiol (VIVELLE-DOT) 0.0375 MG/24HR PLACE 1 PATCH ONTO THE SKIN 2 TIMES A WEEK. 24 patch 1   furosemide (LASIX) 20 MG tablet TAKE 1/2 TABLET BY MOUTH DAILY 45 tablet 1   GLUCOSAMINE-CHONDROITIN-VIT D3 PO Take 1 capsule by mouth 2 (two) times daily.     levothyroxine (SYNTHROID) 112 MCG tablet Take 112 mcg by mouth daily before breakfast.     losartan (COZAAR) 50 MG tablet Take 1 tablet (50 mg total) by mouth daily. 90 tablet 1   montelukast (SINGULAIR) 10 MG tablet Take 10 mg by mouth daily.      Multiple Vitamin (MULTIVITAMIN) tablet Take 1 tablet by mouth daily.       ondansetron (ZOFRAN-ODT) 8 MG disintegrating tablet Take 1 tablet (8 mg total) by mouth every  8 (eight) hours as needed for nausea or vomiting. Caution of sedation 5 tablet 0   potassium chloride SA (KLOR-CON M20) 20 MEQ tablet TAKE 1 TABLET BY MOUTH EVERY DAY 90 tablet 3   silver sulfADIAZINE (SILVADENE) 1 % cream Apply 1 Application topically daily. 50 g 0   tiZANidine (ZANAFLEX) 4 MG tablet TAKE 1 TABLET BY MOUTH EVERY 6 HOURS AS NEEDED 360 tablet 0   traMADol (ULTRAM) 50 MG tablet TAKE 1-2 TABLETS BY MOUTH EVERY 8 HOURS AS NEEDED FOR SEVERE(SHOULDER PAIN). CAUTION OF SEDATION 30 tablet 0   triamterene-hydrochlorothiazide (MAXZIDE-25) 37.5-25 MG tablet TAKE 2 TABLETS BY MOUTH EVERY DAY (Patient taking differently: Take 0.5 tablets by mouth daily.) 180 tablet 1   verapamil (CALAN-SR) 240 MG CR tablet TAKE 1 TABLET  (240 MG TOTAL) BY MOUTH 2 (TWO) TIMES DAILY. 180 tablet 2   No current facility-administered medications for this visit.    Allergies as of 03/22/2023 - Review Complete 03/15/2023  Allergen Reaction Noted   Neosporin [neomycin-polymyxin-gramicidin] Other (See Comments) 06/28/2010   Tape Swelling 10/18/2010    Family History  Problem Relation Age of Onset   Hypertension Mother    Stroke Mother    Depression Mother    Alzheimer's disease Mother    Heart disease Father    Cerebral palsy Father    Hypertension Sister    Breast cancer Cousin 60       NBN mutation   Melanoma Maternal Uncle        dx >50    Review of Systems:    Constitutional: No weight loss, fever, chills, weakness or fatigue HEENT: Eyes: No change in vision               Ears, Nose, Throat:  No change in hearing or congestion Skin: No rash or itching Cardiovascular: No chest pain, chest pressure or palpitations   Respiratory: No SOB or cough Gastrointestinal: See HPI and otherwise negative Genitourinary: No dysuria or change in urinary frequency Neurological: No headache, dizziness or syncope Musculoskeletal: No new muscle or joint pain Hematologic: No bleeding or bruising Psychiatric: No history of depression or anxiety    Physical Exam:  Vital signs: There were no vitals taken for this visit.  Constitutional:   Pleasant Caucasian female*** appears to be in NAD, Well developed, Well nourished, alert and cooperative Head:  Normocephalic and atraumatic. Eyes:   PEERL, EOMI. No icterus. Conjunctiva pink. Ears:  Normal auditory acuity. Neck:  Supple Throat: Oral cavity and pharynx without inflammation, swelling or lesion.  Respiratory: Respirations even and unlabored. Lungs clear to auscultation bilaterally.   No wheezes, crackles, or rhonchi.  Cardiovascular: Normal S1, S2. Regular rate and rhythm. No peripheral edema, cyanosis or pallor.  Gastrointestinal:  Soft, nondistended, nontender. No rebound or  guarding. Normal bowel sounds. No appreciable masses or hepatomegaly. Rectal:  Not performed.  Anoscopy: Msk:  Symmetrical without gross deformities. Without edema, no deformity or joint abnormality.  Neurologic:  Alert and  oriented x4;  grossly normal neurologically.  Skin:   Dry and intact without significant lesions or rashes. Psychiatric: Oriented to person, place and time. Demonstrates good judgement and reason without abnormal affect or behaviors.  RELEVANT LABS AND IMAGING: CBC    Latest Ref Rng & Units 02/26/2023    3:34 PM 02/06/2023    1:58 PM 11/13/2022    3:11 PM  CBC  WBC 4.0 - 10.5 K/uL 10.0  10.1  7.9   Hemoglobin 12.0 - 15.0 g/dL 13.2  13.0  13.2   Hematocrit 36.0 - 46.0 % 40.1  39.6  41.5   Platelets 150.0 - 400.0 K/uL 492.0  548.0  452.0      CMP     Latest Ref Rng & Units 02/26/2023    3:34 PM 02/06/2023    1:58 PM 11/13/2022    3:11 PM  CMP  Glucose 70 - 99 mg/dL 92  92  161   BUN 6 - 23 mg/dL 14  16  16    Creatinine 0.40 - 1.20 mg/dL 0.96  0.45  4.09   Sodium 135 - 145 mEq/L 135  137  133   Potassium 3.5 - 5.1 mEq/L 4.0  3.8  3.7   Chloride 96 - 112 mEq/L 98  101  98   CO2 19 - 32 mEq/L 29  27  28    Calcium 8.4 - 10.5 mg/dL 9.8  9.9  9.6      Lab Results  Component Value Date   TSH 8.16 (H) 02/06/2023   Lab Results  Component Value Date   IRON 26 (L) 02/28/2023   FERRITIN 29.6 02/28/2023     10/14/2020 colonoscopy with Dr. Kinnie Scales, recall 2027 Findings impression No dysplasia Diverticulosis of colon and ascending colon, descending colon and sigmoid colon. Repeat colonoscopy in 5 years 08/03/2015 colonoscopy with Dr. Kinnie Scales Findings impressions Colon polyps, 2 in transverse colon and descending colon Diverticulosis of colon and ascending colon, and sigmoid colon, descending colon and hepatic flexure Assessment: 1. ***  Plan: 1. ***   Thank you for the courtesy of this consult. Please call me with any questions or concerns.   Stephana Morell, FNP-C [facility] 03/21/2023, 4:52 PM  Cc: Tower, Audrie Gallus, MD

## 2023-03-22 ENCOUNTER — Encounter: Payer: Self-pay | Admitting: Gastroenterology

## 2023-03-22 ENCOUNTER — Ambulatory Visit: Payer: PPO | Admitting: Gastroenterology

## 2023-03-22 VITALS — BP 130/68 | HR 73 | Ht 67.0 in | Wt 212.0 lb

## 2023-03-22 DIAGNOSIS — K5904 Chronic idiopathic constipation: Secondary | ICD-10-CM | POA: Diagnosis not present

## 2023-03-22 DIAGNOSIS — D509 Iron deficiency anemia, unspecified: Secondary | ICD-10-CM

## 2023-03-22 DIAGNOSIS — K219 Gastro-esophageal reflux disease without esophagitis: Secondary | ICD-10-CM

## 2023-03-22 DIAGNOSIS — R5383 Other fatigue: Secondary | ICD-10-CM

## 2023-03-22 MED ORDER — SUFLAVE 178.7 G PO SOLR: 1.0000 | Freq: Once | ORAL | 0 refills | Status: AC

## 2023-03-22 NOTE — Patient Instructions (Addendum)
 - Recommend high fiber diet -Continue stool softeners as needed  -Add OTC Miralax po daily prn for constipation -No straining during bowel movement -Continue esomeprazole 40mg  po daily  Please purchase the following medications over the counter and take as directed:  116g Miralax (No Red or Purple)  32oz Gatorade   The day before your procedure (04/30/23) mix a 116g bottle of miralax with a 32oz gatorade. Drink 8oz of this solution every 15 mins until solution is gone.   You have been scheduled for an endoscopy and colonoscopy. Please follow the written instructions given to you at your visit today.  If you use inhalers (even only as needed), please bring them with you on the day of your procedure.  DO NOT TAKE 7 DAYS PRIOR TO TEST- Trulicity (dulaglutide) Ozempic, Wegovy (semaglutide) Mounjaro (tirzepatide) Bydureon Bcise (exanatide extended release)  DO NOT TAKE 1 DAY PRIOR TO YOUR TEST Rybelsus (semaglutide) Adlyxin (lixisenatide) Victoza (liraglutide) Byetta (exanatide) ___________________________________________________________________________  Bonita Quin will receive your bowel preparation through [facility], which ensures the lowest copay and home delivery, with outreach via text or call from an [phone] number. Please respond promptly to avoid rescheduling of your procedure. If you are interested in alternative options or have any questions regarding your prep, please contact them at [phone] ____________________________________________________________________________  Your Provider Has Sent Your Bowel Prep Regimen To [facility]   [facility] will contact you to verify your information and collect your copay, if applicable. Enjoy the comfort of your home while your prescription is mailed to you, FREE of any shipping charges.   [facility] accepts all major insurance benefits and applies discounts & coupons.  Have additional questions?   Chat: [email or web address] Call: [phone] Email: [email or web address] NCPDP: [id number]  How will [facility] contact you?  With a Welcome phone call,  a Welcome text and a checkout link in text form.  Texts you receive from [phone] Are NOT Spam.  *To set up delivery, you must complete the checkout process via link or speak to one of the patient care representatives. If [facility] is unable to reach you, your prescription may be delayed.  To avoid long hold times on the phone, you may also utilize the secure chat feature on the [facility] website to request that they call you back for transaction completion or to expedite your concerns.  Due to recent changes in healthcare laws, you may see the results of your imaging and laboratory studies on MyChart before your provider has had a chance to review them.  We understand that in some cases there may be results that are confusing or concerning to you. Not all laboratory results come back in the same time frame and the provider may be waiting for multiple results in order to interpret others.  Please give [place] 48 hours in order for your provider to thoroughly review all the results before contacting the office for clarification of your results.   _______________________________________________________  If your blood pressure at your visit was 140/90 or greater, please contact your primary care physician to follow up on this.  _______________________________________________________  If you are age 75 or older, your body mass index should be between 23-30. Your Body mass index is 33.2 kg/m. If this is out of the aforementioned range listed, please consider follow up with your Primary Care Provider.  If you are age 54 or younger, your body mass index should be between 19-25. Your Body mass index is 33.2 kg/m. If this is out of the aformentioned range listed, please  consider follow up with your Primary Care Provider.    ________________________________________________________  The [facility] providers would like to encourage you to use [facility] to communicate with providers for non-urgent requests or questions.  Due to long hold times on the telephone, sending your provider a message by [facility] may be a faster and more efficient way to get a response.  Please allow 48 business hours for a response.  Please remember that this is for non-urgent requests.  _______________________________________________________  Thank you for trusting me with your gastrointestinal care!   Margarite Gouge May, NP

## 2023-03-23 NOTE — Progress Notes (Signed)
 Noted.

## 2023-04-03 NOTE — Progress Notes (Unsigned)
   Amber Miles T. Izaiyah Kleinman, MD, CAQ Sports Medicine [facility] at [facility] [address] [place], [address]  Phone: [phone]  FAX: [phone]  Amber Miles - 78 y.o. female  MRN [id number]  Date of Birth: 05/07/1945  Date: 04/04/2023  PCP: Judy Pimple, MD  Referral: Judy Pimple, MD  No chief complaint on file.  Subjective:   Amber Miles is a 78 y.o. very pleasant female patient with There is no height or weight on file to calculate BMI. who presents with the following:  Amber Miles is a very nice patient who I have known off and on over the years.  She presents today with some ongoing lung congestion.  She does use daily Arnuity, suggesting lung disease?    Review of Systems is noted in the HPI, as appropriate  Objective:   There were no vitals taken for this visit.  GEN: No acute distress; alert,appropriate. PULM: Breathing comfortably in no respiratory distress PSYCH: Normally interactive.   Laboratory and Imaging Data:  Assessment and Plan:   ***

## 2023-04-04 ENCOUNTER — Encounter: Payer: Self-pay | Admitting: Family Medicine

## 2023-04-04 ENCOUNTER — Ambulatory Visit (INDEPENDENT_AMBULATORY_CARE_PROVIDER_SITE_OTHER): Admitting: Family Medicine

## 2023-04-04 VITALS — BP 122/60 | HR 56 | Temp 98.0°F | Ht 67.0 in | Wt 210.5 lb

## 2023-04-04 DIAGNOSIS — J4521 Mild intermittent asthma with (acute) exacerbation: Secondary | ICD-10-CM

## 2023-04-04 DIAGNOSIS — M546 Pain in thoracic spine: Secondary | ICD-10-CM | POA: Diagnosis not present

## 2023-04-04 DIAGNOSIS — J189 Pneumonia, unspecified organism: Secondary | ICD-10-CM | POA: Diagnosis not present

## 2023-04-04 DIAGNOSIS — G8929 Other chronic pain: Secondary | ICD-10-CM | POA: Diagnosis not present

## 2023-04-04 MED ORDER — PREDNISONE 20 MG PO TABS: ORAL_TABLET | ORAL | 0 refills | Status: DC

## 2023-04-04 MED ORDER — METHOCARBAMOL 500 MG PO TABS: 500.0000 mg | ORAL_TABLET | Freq: Three times a day (TID) | ORAL | 3 refills | Status: DC | PRN

## 2023-04-04 MED ORDER — DOXYCYCLINE HYCLATE 100 MG PO TABS: 100.0000 mg | ORAL_TABLET | Freq: Two times a day (BID) | ORAL | 0 refills | Status: DC

## 2023-04-09 ENCOUNTER — Telehealth: Payer: Self-pay | Admitting: Gastroenterology

## 2023-04-09 ENCOUNTER — Ambulatory Visit: Payer: Self-pay | Admitting: Family Medicine

## 2023-04-09 NOTE — Telephone Encounter (Signed)
 Will see her then

## 2023-04-09 NOTE — Telephone Encounter (Signed)
 PT is calling in about her prep medication. She does not want to do the Suflave prep and would like to only use Miralax and Dulcolax. Please advise.

## 2023-04-09 NOTE — Telephone Encounter (Signed)
 Chief Complaint: Muscle spasms Symptoms:  Knot under left shoulder blade Frequency: "Ongoing issue since teenager"  Disposition: [x] Appointment(In office)  Additional Notes: Pt states she has chronic muscle spasms. Pt states her daughter noticed a knot under pt's left shoulder blade. Pt wants PCP to take a look at this. Pt scheduled for an appt tmrw with PCP. This RN educated pt on home care, new-worsening symptoms, when to call back/seek emergent care. Pt verbalized understanding and agrees to plan.    Knot on back under left shoulder blade  Reason for Disposition  Muscle aches are a chronic symptom (recurrent or ongoing AND present > 4 weeks)  Answer Assessment - Initial Assessment Questions Chief Complaint: Muscle spasms Symptoms:  Knot under left shoulder blade Frequency: "Ongoing issue since teenager"  Protocols used: Muscle Aches and Body Pain-A-AH

## 2023-04-10 ENCOUNTER — Ambulatory Visit (INDEPENDENT_AMBULATORY_CARE_PROVIDER_SITE_OTHER): Admitting: Family Medicine

## 2023-04-10 ENCOUNTER — Encounter: Payer: Self-pay | Admitting: Family Medicine

## 2023-04-10 VITALS — BP 136/82 | HR 60 | Temp 98.0°F | Ht 67.0 in | Wt 209.5 lb

## 2023-04-10 DIAGNOSIS — M62838 Other muscle spasm: Secondary | ICD-10-CM | POA: Diagnosis not present

## 2023-04-10 DIAGNOSIS — S61402S Unspecified open wound of left hand, sequela: Secondary | ICD-10-CM | POA: Diagnosis not present

## 2023-04-10 DIAGNOSIS — J189 Pneumonia, unspecified organism: Secondary | ICD-10-CM | POA: Diagnosis not present

## 2023-04-10 DIAGNOSIS — R7989 Other specified abnormal findings of blood chemistry: Secondary | ICD-10-CM | POA: Diagnosis not present

## 2023-04-10 DIAGNOSIS — S61402A Unspecified open wound of left hand, initial encounter: Secondary | ICD-10-CM | POA: Insufficient documentation

## 2023-04-10 DIAGNOSIS — E611 Iron deficiency: Secondary | ICD-10-CM

## 2023-04-10 NOTE — Assessment & Plan Note (Signed)
 Undergoing GI work up for [employer] Value Date   IRON 26 (L) 02/28/2023   FERRITIN 29.6 02/28/2023    Also elevated plt ct  Has stopped taking nsaid

## 2023-04-10 NOTE — Patient Instructions (Signed)
 I'm glad the pneumonia is improving  If symptoms do not continue to improve let us know   Finish your medicines  We can re check your cbc about 2-3 weeks after finishing the doxycycline and the prednisone You can call to schedule non fasting labs   The wound on your hand looks great  Keep dressing it with aquaphor and non stick pad   Continue massaging the back  Continue the PT exercises at home and massage and heat  Don't go to sleep on heating pad  The methocarbamol is a better choice for muscle relaxer  Let us know when you need a refill

## 2023-04-10 NOTE — Assessment & Plan Note (Signed)
 Currently taking antibiotic and steroid Will wait 2-3 wk before re checking this   Plt ct up due to virus and then iron def in past  Last was 492 down from 548 prior

## 2023-04-10 NOTE — Progress Notes (Signed)
 Subjective:    Patient ID: Amber Miles, female    DOB: 03-01-1945, 78 y.o.   MRN: [id number]  HPI  Wt Readings from Last 3 Encounters:  04/10/23 209 lb 8 oz (95 kg)  04/04/23 210 lb 8 oz (95.5 kg)  03/22/23 212 lb (96.2 kg)   32.81 kg/m  Vitals:   04/10/23 1452  BP: 136/82  Pulse: 60  Temp: 98 F (36.7 C)  SpO2: 96%   Pt presents for c/o  Muscle spasms  Knot on her back  Concerns about cbc in blood work    Had visit with Dr Patsy Lager on 3/19 for ongoing lung congestion  Also noted spasms of upper back (in setting of back history)   Noted crackles and wheezing more in right lung  Also rhonchi  Dx with CAP of RLL and asthma exacerbation  Treated with doxycycline and prednisone   Cough is now setting down  Wheezing is improving  Albuterol helps    Thought prednisone may also help back  Given a trial of robaxin to see if helps more than tizandidine   Now thinks that the robaxin is working better   Has a muscle knot under shoulder blade on right - has to massage it and then it releases  Painful until it releases  Does PT exercises at home (an hour per day)  Has done PT numerous times outpatient   Back pain  Over 60 years ESI with Dr Maurice Small in the past    Elevated platelet count in past  Was up to 548 right after viral illness  Also iron def  On re check  Lab Results  Component Value Date   WBC 10.0 02/26/2023   HGB 13.2 02/26/2023   HCT 40.1 02/26/2023   MCV 84.2 02/26/2023   PLT 492.0 (H) 02/26/2023    History of low iron in past Lab Results  Component Value Date   IRON 26 (L) 02/28/2023   FERRITIN 29.6 02/28/2023   Seeing GI Scheduled EGD with Dr Marina Goodell as well as colonoscopy with extra prep   Took aleve / off of that now  No abdominal pain     Patient Active Problem List   Diagnosis Date Noted   Open wound of left hand 04/10/2023   CAP (community acquired pneumonia) 04/10/2023   Muscle spasm 04/10/2023   Skin ulcer of hand (HCC)  03/15/2023   Gastroenteritis 03/14/2023   Low iron 03/04/2023   History of UTI 10/27/2022   Elevated platelet count 10/15/2022   Hormone replacement therapy (HRT) 10/13/2022   Multiple thyroid nodules 10/13/2022   Current use of proton pump inhibitor 10/05/2022   Decreased GFR 06/19/2022   Routine general medical examination at a health care facility 07/29/2019   Genetic testing 01/02/2018   Family history of breast cancer    Family history of genetic disease carrier    Meningioma (HCC) 07/17/2017   Fall 01/04/2017   Atrophic vaginitis 09/01/2015   Pedal edema 05/27/2014   Estrogen deficiency 02/21/2013   Encounter for Medicare annual wellness exam 02/21/2013   Low back pain potentially associated with radiculopathy 10/11/2012   Allergic rhinitis 01/28/2010   Prediabetes 07/30/2009   Anxiety state 11/30/2008   Hyperlipidemia 04/13/2008   Hypothyroidism 01/07/2008   HYPERTENSION, BENIGN 01/07/2008   OSTEOARTHRITIS, GENERALIZED 01/07/2008   Past Medical History:  Diagnosis Date   Allergy    allergic rhinitis   Anxiety    Arthritis    OA knee replacement   Cataract  Degenerative disc disease, thoracic    Family history of breast cancer    Family history of genetic disease carrier    paternal cousin has NBN mutation   GERD (gastroesophageal reflux disease)    Hyperlipidemia    Hypertension    Hypothyroid    Reactive airways dysfunction syndrome (HCC)    Skin lesions, generalized    squamous cell skin lesions and basal cell skin lesions   Past Surgical History:  Procedure Laterality Date   ABDOMINAL HYSTERECTOMY  1990s   hyst with oophrectomy for B9lesion/cyst and prolapse   BREAST BIOPSY Bilateral    benign   JOINT REPLACEMENT  05/2009   left total knee replacement   recocele     and cystocele repair   TONSILLECTOMY     tsa     Social History   Tobacco Use   Smoking status: Never   Smokeless tobacco: Never  Vaping Use   Vaping status: Never Used   Substance Use Topics   Alcohol use: No    Alcohol/week: 0.0 standard drinks of alcohol   Drug use: No   Family History  Problem Relation Age of Onset   Hypertension Mother    Stroke Mother    Depression Mother    Alzheimer's disease Mother    Heart disease Father    Cerebral palsy Father    Hypertension Sister    Breast cancer Cousin 43       NBN mutation   Melanoma Maternal Uncle        dx >50   Allergies  Allergen Reactions   Neosporin [Neomycin-Polymyxin-Gramicidin] Other (See Comments)    blisters   Tape Swelling    Itching and redness Pt said severe reaction to surgical tape.? latex   Current Outpatient Medications on File Prior to Visit  Medication Sig Dispense Refill   acyclovir (ZOVIRAX) 400 MG tablet Take 400 mg by mouth 3 (three) times daily. X 3 days for flares     albuterol (VENTOLIN HFA) 108 (90 Base) MCG/ACT inhaler INHALE 1 TO 2 PUFFS EVERY 4 TO 6 HOURS AS NEEDED FOR WHEEZE OR COUGH 8.5 each 1   ARNUITY ELLIPTA 200 MCG/ACT AEPB Inhale 1 puff into the lungs daily.     azelastine (ASTELIN) 0.1 % nasal spray Place 1 spray into both nostrils 2 (two) times daily.     cetirizine (ZYRTEC) 5 MG tablet Take 5 mg by mouth daily as needed.     Docusate Calcium (STOOL SOFTENER PO) Take 1 tablet by mouth daily as needed.     doxycycline (VIBRA-TABS) 100 MG tablet Take 1 tablet (100 mg total) by mouth 2 (two) times daily. 20 tablet 0   esomeprazole (NEXIUM) 40 MG capsule TAKE 1 CAPSULE BY MOUTH EVERY DAY BEFORE BREAKFAST 90 capsule 1   estradiol (VIVELLE-DOT) 0.0375 MG/24HR PLACE 1 PATCH ONTO THE SKIN 2 TIMES A WEEK. 24 patch 1   furosemide (LASIX) 20 MG tablet TAKE 1/2 TABLET BY MOUTH DAILY 45 tablet 1   GLUCOSAMINE-CHONDROITIN-VIT D3 PO Take 1 capsule by mouth 2 (two) times daily.     levothyroxine (SYNTHROID) 112 MCG tablet Take 112 mcg by mouth daily before breakfast.     losartan (COZAAR) 50 MG tablet Take 1 tablet (50 mg total) by mouth daily. 90 tablet 1    methocarbamol (ROBAXIN) 500 MG tablet Take 1 tablet (500 mg total) by mouth 3 (three) times daily as needed for muscle spasms. 90 tablet 3   montelukast (SINGULAIR) 10 MG tablet  Take 10 mg by mouth daily.      Multiple Vitamin (MULTIVITAMIN) tablet Take 1 tablet by mouth daily.       ondansetron (ZOFRAN-ODT) 8 MG disintegrating tablet Take 1 tablet (8 mg total) by mouth every 8 (eight) hours as needed for nausea or vomiting. Caution of sedation 5 tablet 0   potassium chloride SA (KLOR-CON M20) 20 MEQ tablet TAKE 1 TABLET BY MOUTH EVERY DAY 90 tablet 3   predniSONE (DELTASONE) 20 MG tablet 2 tabs po daily for 5 days, then 1 tab po daily for 5 days 15 tablet 0   silver sulfADIAZINE (SILVADENE) 1 % cream Apply 1 Application topically daily. 50 g 0   traMADol (ULTRAM) 50 MG tablet TAKE 1-2 TABLETS BY MOUTH EVERY 8 HOURS AS NEEDED FOR SEVERE(SHOULDER PAIN). CAUTION OF SEDATION 30 tablet 0   triamterene-hydrochlorothiazide (MAXZIDE-25) 37.5-25 MG tablet TAKE 2 TABLETS BY MOUTH EVERY DAY (Patient taking differently: Take 0.5 tablets by mouth daily.) 180 tablet 1   verapamil (CALAN-SR) 240 MG CR tablet TAKE 1 TABLET (240 MG TOTAL) BY MOUTH 2 (TWO) TIMES DAILY. 180 tablet 2   No current facility-administered medications on file prior to visit.    Review of Systems  Constitutional:  Positive for fatigue. Negative for activity change, appetite change, fever and unexpected weight change.  HENT:  Negative for congestion, ear pain, rhinorrhea, sinus pressure and sore throat.   Eyes:  Negative for pain, redness and visual disturbance.  Respiratory:  Positive for cough. Negative for shortness of breath and wheezing.   Cardiovascular:  Negative for chest pain and palpitations.  Gastrointestinal:  Negative for abdominal pain, blood in stool, constipation and diarrhea.  Endocrine: Negative for polydipsia and polyuria.  Genitourinary:  Negative for dysuria, frequency and urgency.  Musculoskeletal:  Positive for  back pain. Negative for arthralgias and myalgias.  Skin:  Positive for wound. Negative for pallor and rash.  Allergic/Immunologic: Negative for environmental allergies.  Neurological:  Negative for dizziness, syncope and headaches.  Hematological:  Negative for adenopathy. Does not bruise/bleed easily.  Psychiatric/Behavioral:  Negative for decreased concentration and dysphoric mood. The patient is not nervous/anxious.        Objective:   Physical Exam Constitutional:      General: She is not in acute distress.    Appearance: Normal appearance. She is well-developed. She is obese. She is not ill-appearing or diaphoretic.  HENT:     Head: Normocephalic and atraumatic.  Eyes:     Conjunctiva/sclera: Conjunctivae normal.     Pupils: Pupils are equal, round, and reactive to light.  Neck:     Thyroid: No thyromegaly.     Vascular: No carotid bruit or JVD.  Cardiovascular:     Rate and Rhythm: Normal rate and regular rhythm.     Heart sounds: Normal heart sounds.     No gallop.  Pulmonary:     Effort: Pulmonary effort is normal. No respiratory distress.     Breath sounds: No stridor. Wheezing, rhonchi and rales present.     Comments: Few rhonchi scattered Few rales right base Wheeze with forced exp  Abdominal:     General: There is no distension or abdominal bruit.     Palpations: Abdomen is soft.  Musculoskeletal:     Cervical back: Normal range of motion and neck supple.     Right lower leg: No edema.     Left lower leg: No edema.     Comments: Tender area medial to right scapula  No knot felt today  Limited rom of TS due to discomfort  No spinous process tenderness   Lymphadenopathy:     Cervical: No cervical adenopathy.  Skin:    General: Skin is warm and dry.     Coloration: Skin is not pale.     Findings: No rash.     Comments: Oval shaped skin tear left hand-thenar area  Appears healthy and not infected Re dressed with telfa, aquapor (patient brought) and coban   Neurological:     Mental Status: She is alert.     Sensory: No sensory deficit.     Coordination: Coordination normal.     Deep Tendon Reflexes: Reflexes are normal and symmetric. Reflexes normal.  Psychiatric:        Mood and Affect: Mood normal.           Assessment & Plan:   Problem List Items Addressed This Visit       Respiratory   CAP (community acquired pneumonia) - Primary   Clinically improving on doxycycline and prednisone  Some rales still heard on right  Pulse ox 965 on RA Reviewed Dr Cyndie Chime note and plan from 3/19   Update if not starting to improve in a week or if worsening  Call back and Er precautions noted in detail today          Other   Open wound of left hand   Oval shaped skin tear on left hand (thenar area) is improving/ healing with healthy appearing tissue From a prior fall  No signs of infx  Will continue to dress with tefla pad/aquaphor and coban  Do not submerge until healed       Muscle spasm   Medial to both scapulae  Worse on left today  Responds to massage / heat  Trying methocarbamol and helpful -will conitnue prn with caution of sedation       Low iron   Undergoing GI work up for [employer] Value Date   IRON 26 (L) 02/28/2023   FERRITIN 29.6 02/28/2023    Also elevated plt ct  Has stopped taking nsaid       Relevant Orders   CBC with Differential/Platelet   Iron   Elevated platelet count   Currently taking antibiotic and steroid Will wait 2-3 wk before re checking this   Plt ct up due to virus and then iron def in past  Last was 492 down from 548 prior       Relevant Orders   CBC with Differential/Platelet   Iron

## 2023-04-10 NOTE — Assessment & Plan Note (Signed)
 Medial to both scapulae  Worse on left today  Responds to massage / heat  Trying methocarbamol and helpful -will conitnue prn with caution of sedation

## 2023-04-10 NOTE — Assessment & Plan Note (Signed)
 Clinically improving on doxycycline and prednisone  Some rales still heard on right  Pulse ox 965 on RA Reviewed Dr Cyndie Chime note and plan from 3/19   Update if not starting to improve in a week or if worsening  Call back and Er precautions noted in detail today

## 2023-04-10 NOTE — Assessment & Plan Note (Signed)
 Oval shaped skin tear on left hand (thenar area) is improving/ healing with healthy appearing tissue From a prior fall  No signs of infx  Will continue to dress with tefla pad/aquaphor and coban  Do not submerge until healed

## 2023-04-19 ENCOUNTER — Encounter: Payer: Self-pay | Admitting: Internal Medicine

## 2023-04-20 ENCOUNTER — Other Ambulatory Visit: Payer: Self-pay | Admitting: Family Medicine

## 2023-04-20 NOTE — Telephone Encounter (Signed)
 Looks like last visit with Dr. Patsy Lager Robaxin was given and zanaflex was d/c, please advise

## 2023-04-20 NOTE — Telephone Encounter (Signed)
 Pt said she isn't taking the zanaflex she is using the Robaxin and she advised the pharmacies automatic sxs she didn't want it refill but they sent the request anyway. Rx declined and pt aware

## 2023-04-20 NOTE — Telephone Encounter (Signed)
 Please ask her about this  Which is she taking or which works better?  Thaks

## 2023-04-24 ENCOUNTER — Ambulatory Visit: Payer: PPO | Admitting: Podiatry

## 2023-04-24 ENCOUNTER — Telehealth: Payer: Self-pay | Admitting: Gastroenterology

## 2023-04-24 NOTE — Telephone Encounter (Signed)
 Patient called and stated that she never received her instruction that where suppose to be sent to her through mail. Patient stated that her granddaughter Concepcion Living can come and pick them up since she works at [employer]. Patient is schedule for a double procedure on April 15 th. Patient is requesting a call back. Please advise.

## 2023-04-25 ENCOUNTER — Other Ambulatory Visit: Payer: Self-pay | Admitting: Family Medicine

## 2023-04-26 DIAGNOSIS — H2513 Age-related nuclear cataract, bilateral: Secondary | ICD-10-CM | POA: Diagnosis not present

## 2023-04-26 DIAGNOSIS — H40053 Ocular hypertension, bilateral: Secondary | ICD-10-CM | POA: Diagnosis not present

## 2023-04-26 NOTE — Telephone Encounter (Signed)
 Name of Medication: Tramadol Name of Pharmacy: [name] Last Fill or Written Date and Quantity: 01/22/23 #30 tab/ 0 refill Last Office Visit and Type: acute 04/10/23 Next Office Visit and Type: CPE 10/23/23

## 2023-05-01 ENCOUNTER — Encounter: Payer: Self-pay | Admitting: Internal Medicine

## 2023-05-01 ENCOUNTER — Ambulatory Visit: Admitting: Internal Medicine

## 2023-05-01 VITALS — BP 108/68 | HR 69 | Temp 97.3°F | Resp 11 | Ht 67.0 in | Wt 212.0 lb

## 2023-05-01 DIAGNOSIS — K222 Esophageal obstruction: Secondary | ICD-10-CM

## 2023-05-01 DIAGNOSIS — F419 Anxiety disorder, unspecified: Secondary | ICD-10-CM | POA: Diagnosis not present

## 2023-05-01 DIAGNOSIS — K5904 Chronic idiopathic constipation: Secondary | ICD-10-CM | POA: Diagnosis not present

## 2023-05-01 DIAGNOSIS — D509 Iron deficiency anemia, unspecified: Secondary | ICD-10-CM | POA: Diagnosis not present

## 2023-05-01 DIAGNOSIS — K317 Polyp of stomach and duodenum: Secondary | ICD-10-CM

## 2023-05-01 DIAGNOSIS — K219 Gastro-esophageal reflux disease without esophagitis: Secondary | ICD-10-CM

## 2023-05-01 DIAGNOSIS — K5730 Diverticulosis of large intestine without perforation or abscess without bleeding: Secondary | ICD-10-CM | POA: Diagnosis not present

## 2023-05-01 DIAGNOSIS — K449 Diaphragmatic hernia without obstruction or gangrene: Secondary | ICD-10-CM | POA: Diagnosis not present

## 2023-05-01 DIAGNOSIS — K259 Gastric ulcer, unspecified as acute or chronic, without hemorrhage or perforation: Secondary | ICD-10-CM

## 2023-05-01 DIAGNOSIS — I10 Essential (primary) hypertension: Secondary | ICD-10-CM | POA: Diagnosis not present

## 2023-05-01 DIAGNOSIS — E039 Hypothyroidism, unspecified: Secondary | ICD-10-CM | POA: Diagnosis not present

## 2023-05-01 MED ORDER — SODIUM CHLORIDE 0.9 % IV SOLN: 500.0000 mL | Freq: Once | INTRAVENOUS | Status: DC

## 2023-05-01 NOTE — Progress Notes (Signed)
 Expand All Collapse All    Chief Complaint: Iron deficiency anemia Primary GI Doctor: Dr. Marina Goodell (per patient request)   HPI:  Patient is a 78 year old female patient with past medical history of anxiety, GERD, hypertension, and hyperlipidemia, who was referred to me by Judy Pimple, MD on 03/04/2023 for a complaint of iron deficiency anemia.     Interval History     Patient presents for evaluation for iron deficiency anemia, accompanied by daughter. She had complained of increased fatigue and dizziness to her PCP and lab work revealed low iron. No known history of anemia. She is currently not on any oral supplementation. No overt bleeding. She does state she donates blood twice yearly, last time was in November- December. Consumes meat. Patient has history of GERD and taking Nexium 40 mg po daily. She reports this controls her symptoms. Occasional issues with dysphagia if the meat or bread is dry.  Patient denies nausea, vomiting, or weight loss. Patient placed on diuretics and has lost some fluid weight in lower extremities. Patient does have constipation and takes stool softeners once daily. She has one bowel movement daily , some straining at times. No rectal bleeding.  No alcohol use. Nonsmoker. Denies NSAID use.No blood thinners. Never had EGD.Patient's family history includes daughter with gastric ulcers requiring extensive surgery. Surgical history- hysterectomy, rectocele repair.      Wt Readings from Last 3 Encounters:  03/22/23 212 lb (96.2 kg)  03/15/23 214 lb (97.1 kg)  03/14/23 214 lb (97.1 kg)        Past Medical History:  Diagnosis Date   Allergy      allergic rhinitis   Anxiety     Arthritis      OA knee replacement   Cataract     Degenerative disc disease, thoracic     Family history of breast cancer     Family history of genetic disease carrier      paternal cousin has NBN mutation   GERD (gastroesophageal reflux disease)     Hyperlipidemia     Hypertension      Hypothyroid     Reactive airways dysfunction syndrome (HCC)     Skin lesions, generalized      squamous cell skin lesions and basal cell skin lesions               Past Surgical History:  Procedure Laterality Date   ABDOMINAL HYSTERECTOMY   1990s    hyst with oophrectomy for B9lesion/cyst and prolapse   BREAST BIOPSY Bilateral      benign   JOINT REPLACEMENT   05/2009    left total knee replacement   recocele        and cystocele repair   TONSILLECTOMY       tsa                    Current Outpatient Medications  Medication Sig Dispense Refill   acyclovir (ZOVIRAX) 800 MG tablet TAKE 1 TABLET BY MOUTH 3 TIMES DAILY FOR 3 DAYS FLARES   2   albuterol (VENTOLIN HFA) 108 (90 Base) MCG/ACT inhaler INHALE 1 TO 2 PUFFS EVERY 4 TO 6 HOURS AS NEEDED FOR WHEEZE OR COUGH 8.5 each 1   ARNUITY ELLIPTA 200 MCG/ACT AEPB Inhale 1 puff into the lungs daily.       azelastine (ASTELIN) 0.1 % nasal spray Place 1 spray into both nostrils 2 (two) times daily.       cetirizine (  ZYRTEC) 5 MG tablet Take 5 mg by mouth daily as needed.       Docusate Calcium (STOOL SOFTENER PO) Take 1 tablet by mouth daily as needed.       esomeprazole (NEXIUM) 40 MG capsule TAKE 1 CAPSULE BY MOUTH EVERY DAY BEFORE BREAKFAST 90 capsule 1   estradiol (VIVELLE-DOT) 0.0375 MG/24HR PLACE 1 PATCH ONTO THE SKIN 2 TIMES A WEEK. 24 patch 1   furosemide (LASIX) 20 MG tablet TAKE 1/2 TABLET BY MOUTH DAILY 45 tablet 1   GLUCOSAMINE-CHONDROITIN-VIT D3 PO Take 1 capsule by mouth 2 (two) times daily.       levothyroxine (SYNTHROID) 112 MCG tablet Take 112 mcg by mouth daily before breakfast.       losartan (COZAAR) 50 MG tablet Take 1 tablet (50 mg total) by mouth daily. 90 tablet 1   montelukast (SINGULAIR) 10 MG tablet Take 10 mg by mouth daily.        Multiple Vitamin (MULTIVITAMIN) tablet Take 1 tablet by mouth daily.         ondansetron (ZOFRAN-ODT) 8 MG disintegrating tablet Take 1 tablet (8 mg total) by mouth every 8 (eight)  hours as needed for nausea or vomiting. Caution of sedation 5 tablet 0   potassium chloride SA (KLOR-CON M20) 20 MEQ tablet TAKE 1 TABLET BY MOUTH EVERY DAY 90 tablet 3   silver sulfADIAZINE (SILVADENE) 1 % cream Apply 1 Application topically daily. 50 g 0   tiZANidine (ZANAFLEX) 4 MG tablet TAKE 1 TABLET BY MOUTH EVERY 6 HOURS AS NEEDED 360 tablet 0   traMADol (ULTRAM) 50 MG tablet TAKE 1-2 TABLETS BY MOUTH EVERY 8 HOURS AS NEEDED FOR SEVERE(SHOULDER PAIN). CAUTION OF SEDATION 30 tablet 0   triamterene-hydrochlorothiazide (MAXZIDE-25) 37.5-25 MG tablet TAKE 2 TABLETS BY MOUTH EVERY DAY (Patient taking differently: Take 0.5 tablets by mouth daily.) 180 tablet 1   verapamil (CALAN-SR) 240 MG CR tablet TAKE 1 TABLET (240 MG TOTAL) BY MOUTH 2 (TWO) TIMES DAILY. 180 tablet 2      No current facility-administered medications for this visit.             Allergies as of 03/22/2023 - Review Complete 03/22/2023  Allergen Reaction Noted   Neosporin [neomycin-polymyxin-gramicidin] Other (See Comments) 06/28/2010   Tape Swelling 10/18/2010           Family History  Problem Relation Age of Onset   Hypertension Mother     Stroke Mother     Depression Mother     Alzheimer's disease Mother     Heart disease Father     Cerebral palsy Father     Hypertension Sister     Breast cancer Cousin 47        NBN mutation   Melanoma Maternal Uncle          dx >50        Review of Systems:    Constitutional: No weight loss, fever, chills, weakness or fatigue HEENT: Eyes: No change in vision               Ears, Nose, Throat:  No change in hearing or congestion Skin: No rash or itching Cardiovascular: No chest pain, chest pressure or palpitations   Respiratory: No SOB or cough Gastrointestinal: See HPI and otherwise negative Genitourinary: No dysuria or change in urinary frequency Neurological: No headache, dizziness or syncope Musculoskeletal: No new muscle or joint pain Hematologic: No bleeding or  bruising Psychiatric: No history of depression or anxiety  Physical Exam:  Vital signs: BP 130/68   Pulse 73   Ht 5\' 7"  (1.702 m)   Wt 212 lb (96.2 kg)   BMI 33.20 kg/m    Constitutional:   Pleasant Caucasian female appears to be in NAD, Well developed, Well nourished, alert and cooperative Throat: Oral cavity and pharynx without inflammation, swelling or lesion.  Respiratory: Respirations even and unlabored. Lungs clear to auscultation bilaterally.   No wheezes, crackles, or rhonchi.  Cardiovascular: Normal S1, S2. Regular rate and rhythm. No peripheral edema, cyanosis or pallor.  Gastrointestinal:  Soft, nondistended, nontender. No rebound or guarding. Normal bowel sounds. No appreciable masses or hepatomegaly. Rectal:  Not performed.  Msk:  Symmetrical without gross deformities. Without edema, no deformity or joint abnormality.  Neurologic:  Alert and  oriented x4;  grossly normal neurologically.  Skin:   Dry and intact without significant lesions or rashes. Psychiatric: Oriented to person, place and time. Demonstrates good judgement and reason without abnormal affect or behaviors.   RELEVANT LABS AND IMAGING: CBC     Latest Ref Rng & Units 02/26/2023    3:34 PM 02/06/2023    1:58 PM 11/13/2022    3:11 PM  CBC  WBC 4.0 - 10.5 K/uL 10.0  10.1  7.9   Hemoglobin 12.0 - 15.0 g/dL 04.5  40.9  81.1   Hematocrit 36.0 - 46.0 % 40.1  39.6  41.5   Platelets 150.0 - 400.0 K/uL 492.0  548.0  452.0     CMP         Latest Ref Rng & Units 02/26/2023    3:34 PM 02/06/2023    1:58 PM 11/13/2022    3:11 PM  CMP  Glucose 70 - 99 mg/dL 92  92  914   BUN 6 - 23 mg/dL 14  16  16    Creatinine 0.40 - 1.20 mg/dL 7.82  9.56  2.13   Sodium 135 - 145 mEq/L 135  137  133   Potassium 3.5 - 5.1 mEq/L 4.0  3.8  3.7   Chloride 96 - 112 mEq/L 98  101  98   CO2 19 - 32 mEq/L 29  27  28    Calcium 8.4 - 10.5 mg/dL 9.8  9.9  9.6     Recent Labs       Lab Results  Component Value Date    TSH 8.16  (H) 02/06/2023      Recent Labs       Lab Results  Component Value Date    IRON 26 (L) 02/28/2023    FERRITIN 29.6 02/28/2023      10/14/2020 colonoscopy with Dr. Kinnie Scales, recall 2027 Findings impression: No dysplasia Diverticulosis of colon and ascending colon, descending colon and sigmoid colon. Repeat colonoscopy in 5 years 08/03/2015 colonoscopy with Dr. Kinnie Scales Findings impressions: Colon polyps, 2 in transverse colon and descending colon Diverticulosis of colon and ascending colon, and sigmoid colon, descending colon and hepatic flexure Assessment:     Encounter Diagnoses  Name Primary?   Iron deficiency anemia, unspecified iron deficiency anemia type Yes   Chronic idiopathic constipation     Gastroesophageal reflux disease, unspecified whether esophagitis present     Other fatigue     78 year old female patient who presents for evaluation of iron deficiency anemia discovered on recent lab work. No overt bleeding. Patient not currently on iron supplementation. Defer iron supplementation to Dr. Marina Goodell. She does have fatigue and dizziness. Will proceed with upper GI endoscopy and  colonoscopy with extra prep with Dr. Elvin Hammer in [facility]. Patients GERD controlled with current regimen, thus no changes needed. For the chronic constipation we discussed high fiber diet and adding OTC Miralax as needed.    Plan: - Recommend high fiber diet -Continue stool softeners as needed  -Add OTC Miralax po daily prn for constipation -No NSAID's.  -Continue esomeprazole 40 mg po daily -Schedule EGD with Dr. Elvin Hammer in [facility] The risks and benefits of EGD with possible biopsies and esophageal dilation were discussed with the patient who agrees to proceed. - Schedule a colonoscopy with extra prep. The risks and benefits of colonoscopy with possible polypectomy / biopsies were discussed and the patient agrees to proceed.    Thank you for the courtesy of this consult. Please call me with any questions or  concerns.    Deanna May, FNP-C  Gastroenterology 03/22/2023, 2:14 PM   Cc: Tower, [employer]ley [employer]eds, MD

## 2023-05-01 NOTE — Progress Notes (Signed)
 Sedate, gd SR, tolerated procedure well, VSS, report to RN

## 2023-05-01 NOTE — Patient Instructions (Addendum)
 Resume previous diet Continue present medications Recommend daily multivitamin with iron Return to your primary care doctor Handout given for GERD and hiatal hernia and diverticulosis YOU HAD AN ENDOSCOPIC PROCEDURE TODAY AT THE [facility]:   Refer to the procedure report that was given to you for any specific questions about what was found during the examination.  If the procedure report does not answer your questions, please call your gastroenterologist to clarify.  If you requested that your care partner not be given the details of your procedure findings, then the procedure report has been included in a sealed envelope for you to review at your convenience later.  YOU SHOULD EXPECT: Some feelings of bloating in the abdomen. Passage of more gas than usual.  Walking can help get rid of the air that was put into your GI tract during the procedure and reduce the bloating. If you had a lower endoscopy (such as a colonoscopy or flexible sigmoidoscopy) you may notice spotting of blood in your stool or on the toilet paper. If you underwent a bowel prep for your procedure, you may not have a normal bowel movement for a few days.  Please Note:  You might notice some irritation and congestion in your nose or some drainage.  This is from the oxygen used during your procedure.  There is no need for concern and it should clear up in a day or so.  SYMPTOMS TO REPORT IMMEDIATELY: Following lower endoscopy (colonoscopy or flexible sigmoidoscopy):  Excessive amounts of blood in the stool  Significant tenderness or worsening of abdominal pains  Swelling of the abdomen that is new, acute  Fever of 100F or higher Following upper endoscopy (EGD)  Vomiting of blood or coffee ground material  New chest pain or pain under the shoulder blades  Painful or persistently difficult swallowing  New shortness of breath  Black, tarry-looking stools  For urgent or emergent issues, a gastroenterologist can be  reached at any hour by calling [phone]. Do not use MyChart messaging for urgent concerns.    DIET:  We do recommend a small meal at first, but then you may proceed to your regular diet.  Drink plenty of fluids but you should avoid alcoholic beverages for 24 hours.  ACTIVITY:  You should plan to take it easy for the rest of today and you should NOT DRIVE or use heavy machinery until tomorrow (because of the sedation medicines used during the test).    FOLLOW UP: Our staff will call the number listed on your records the next business day following your procedure.  We will call around 7:15- 8:00 am to check on you and address any questions or concerns that you may have regarding the information given to you following your procedure. If we do not reach you, we will leave a message.     SIGNATURES/CONFIDENTIALITY: You and/or your care partner have signed paperwork which will be entered into your electronic medical record.  These signatures attest to the fact that that the information above on your After Visit Summary has been reviewed and is understood.  Full responsibility of the confidentiality of this discharge information lies with you and/or your care-partner.

## 2023-05-01 NOTE — Op Note (Signed)
 [facility] Patient Name: Amber Miles Procedure Date: 05/01/2023 3:32 PM MRN: [id number] Endoscopist: Wilhemina Bonito. Marina Goodell , MD, [id number] Age: 78 Referring MD:  Date of Birth: [birth year] Gender: Female Account #: [id number] Procedure:                Colonoscopy Indications:              Iron deficiency anemia. History of colon polyps.                            Most recent exams with Dr. Kinnie Scales 2017, 2022 Medicines:                Monitored Anesthesia Care Procedure:                Pre-Anesthesia Assessment:                           - Prior to the procedure, a History and Physical                            was performed, and patient medications and                            allergies were reviewed. The patient's tolerance of                            previous anesthesia was also reviewed. The risks                            and benefits of the procedure and the sedation                            options and risks were discussed with the patient.                            All questions were answered, and informed consent                            was obtained. Prior Anticoagulants: The patient has                            taken no anticoagulant or antiplatelet agents. ASA                            Grade Assessment: II - A patient with mild systemic                            disease. After reviewing the risks and benefits,                            the patient was deemed in satisfactory condition to                            undergo the procedure.  After obtaining informed consent, the colonoscope                            was passed under direct vision. Throughout the                            procedure, the patient's blood pressure, pulse, and                            oxygen saturations were monitored continuously. The                            Olympus Scope SN: [id number] was introduced through                            the anus and  advanced to the the cecum, identified                            by appendiceal orifice and ileocecal valve. The                            ileocecal valve, appendiceal orifice, and rectum                            were photographed. The quality of the bowel                            preparation was excellent. The colonoscopy was                            performed without difficulty. The patient tolerated                            the procedure well. The bowel preparation used was                            SUPREP via split dose instruction. Scope In: 3:44:27 PM Scope Out: 3:54:09 PM Scope Withdrawal Time: 0 hours 6 minutes 44 seconds  Total Procedure Duration: 0 hours 9 minutes 42 seconds  Findings:                 Many diverticula were found in the entire colon.                           The exam was otherwise without abnormality on                            direct and retroflexion views. Complications:            No immediate complications. Estimated blood loss:                            None. Estimated Blood Loss:     Estimated blood loss: none. Impression:               -  Diverticulosis in the entire examined colon.                           - The examination was otherwise normal on direct                            and retroflexion views.                           - No specimens collected. Recommendation:           - Repeat colonoscopy is not recommended for                            surveillance.                           - Patient has a contact number available for                            emergencies. The signs and symptoms of potential                            delayed complications were discussed with the                            patient. Return to normal activities tomorrow.                            Written discharge instructions were provided to the                            patient.                           - Resume previous diet.                            - Continue present medications. Murel Arlington. Elvin Hammer, MD 05/01/2023 3:58:34 PM This report has been signed electronically.

## 2023-05-01 NOTE — Progress Notes (Signed)
 Vitals-DT  Pt's states no medical or surgical changes since previsit or office visit.

## 2023-05-01 NOTE — Op Note (Signed)
 [facility] Patient Name: Amber Miles Procedure Date: 05/01/2023 3:29 PM MRN: [id number] Endoscopist: Wilhemina Bonito. Marina Goodell , MD, [id number] Age: 78 Referring MD:  Date of Birth: 02/19/1945 Gender: Female Account #: [id number] Procedure:                Upper GI endoscopy Indications:              Iron deficiency anemia, Esophageal reflux Medicines:                Monitored Anesthesia Care Procedure:                Pre-Anesthesia Assessment:                           - Prior to the procedure, a History and Physical                            was performed, and patient medications and                            allergies were reviewed. The patient's tolerance of                            previous anesthesia was also reviewed. The risks                            and benefits of the procedure and the sedation                            options and risks were discussed with the patient.                            All questions were answered, and informed consent                            was obtained. Prior Anticoagulants: The patient has                            taken no anticoagulant or antiplatelet agents. ASA                            Grade Assessment: II - A patient with mild systemic                            disease. After reviewing the risks and benefits,                            the patient was deemed in satisfactory condition to                            undergo the procedure.                           After obtaining informed consent, the endoscope was  passed under direct vision. Throughout the                            procedure, the patient's blood pressure, pulse, and                            oxygen saturations were monitored continuously. The                            GIF [id number] was introduced through the                            mouth, and advanced to the second part of duodenum.                            The upper GI  endoscopy was accomplished without                            difficulty. The patient tolerated the procedure                            well. Scope In: Scope Out: Findings:                 The esophagus revealed a large caliber ringlike                            stricture at the gastroesophageal junction. No                            active inflammation. No Barrett's.                           The stomach revealed a moderate hiatal hernia with                            several Cameron erosions. Multiple benign fundic                            gland polyps. Otherwise normal stomach.                           The examined duodenum was normal.                           The cardia and gastric fundus were normal on                            retroflexion. Complications:            No immediate complications. Estimated Blood Loss:     Estimated blood loss: none. Impression:               1. GERD with esophageal stricture                           2. Hiatal hernia with Sheria Lang  erosions. This can                            cause iron deficiency                           3. Incidental gastric polyps                           4. Otherwise normal EGD. Recommendation:           - Patient has a contact number available for                            emergencies. The signs and symptoms of potential                            delayed complications were discussed with the                            patient. Return to normal activities tomorrow.                            Written discharge instructions were provided to the                            patient.                           - Resume previous diet.                           - Continue present medications.                           - Recommend daily multivitamin with iron                           - Return to care of your primary provider Murel Arlington. Elvin Hammer, MD 05/01/2023 4:06:06 PM This report has been signed electronically.

## 2023-05-01 NOTE — Progress Notes (Signed)
 Pt with long hx of asthma, has been coughing, used Albuterol inhaler in waiting area, lungs CTA

## 2023-05-02 ENCOUNTER — Telehealth: Payer: Self-pay | Admitting: *Deleted

## 2023-05-02 NOTE — Telephone Encounter (Signed)
  Follow up Call-     05/01/2023    2:48 PM 05/01/2023    2:36 PM  Call back number  Post procedure Call Back phone  # [phone]   Permission to leave phone message  Yes     Patient questions:  Do you have a fever, pain , or abdominal swelling? No. Pain Score  0 *  Have you tolerated food without any problems? Yes.    Have you been able to return to your normal activities? Yes.    Do you have any questions about your discharge instructions: Diet   No. Medications  No. Follow up visit  No.  Do you have questions or concerns about your Care? No.  Actions: * If pain score is 4 or above: No action needed, pain <4.

## 2023-05-03 DIAGNOSIS — E890 Postprocedural hypothyroidism: Secondary | ICD-10-CM | POA: Diagnosis not present

## 2023-05-09 DIAGNOSIS — E041 Nontoxic single thyroid nodule: Secondary | ICD-10-CM | POA: Diagnosis not present

## 2023-05-09 DIAGNOSIS — E890 Postprocedural hypothyroidism: Secondary | ICD-10-CM | POA: Diagnosis not present

## 2023-05-11 ENCOUNTER — Other Ambulatory Visit: Payer: Self-pay | Admitting: Family Medicine

## 2023-05-12 ENCOUNTER — Other Ambulatory Visit: Payer: Self-pay | Admitting: Family Medicine

## 2023-05-15 DIAGNOSIS — J300 Vasomotor rhinitis: Secondary | ICD-10-CM | POA: Diagnosis not present

## 2023-05-15 DIAGNOSIS — J4530 Mild persistent asthma, uncomplicated: Secondary | ICD-10-CM | POA: Diagnosis not present

## 2023-05-15 DIAGNOSIS — H1045 Other chronic allergic conjunctivitis: Secondary | ICD-10-CM | POA: Diagnosis not present

## 2023-06-17 ENCOUNTER — Other Ambulatory Visit: Payer: Self-pay | Admitting: Family Medicine

## 2023-06-25 ENCOUNTER — Other Ambulatory Visit: Payer: Self-pay | Admitting: Family Medicine

## 2023-07-24 ENCOUNTER — Other Ambulatory Visit: Payer: Self-pay | Admitting: Family Medicine

## 2023-07-24 NOTE — Telephone Encounter (Signed)
 Last office visit 04/10/2023 for CAP.  Last refilled 04/04/2023 for #90 with 3 refills by Dr. Watt.  Next Appt: CPE 10/23/2023.

## 2023-07-31 ENCOUNTER — Other Ambulatory Visit: Payer: Self-pay | Admitting: Family Medicine

## 2023-08-01 NOTE — Telephone Encounter (Signed)
 Name of Medication: Tramadol  Name of Pharmacy: [name] Last Fill or Written Date and Quantity: 04/26/23 #30 tab/ 0 refill Last Office Visit and Type: acute 04/10/23 Next Office Visit and Type: CPE 10/23/23

## 2023-08-07 ENCOUNTER — Telehealth: Payer: Self-pay | Admitting: *Deleted

## 2023-08-07 NOTE — Telephone Encounter (Signed)
 Pt called and said there was a problem with her Tramadol  Rx.   Called [facility] and they said with the new regulations they have to have a diagnosis code with controlled meds now. Pharmacy was provided dx code and said they will get med filled.  Called pt and no answer so left VM and advised pt of what happened and to call pharmacy if she has any other questions

## 2023-08-09 ENCOUNTER — Ambulatory Visit: Payer: PPO

## 2023-08-09 VITALS — Ht 67.0 in | Wt 212.0 lb

## 2023-08-09 DIAGNOSIS — Z0000 Encounter for general adult medical examination without abnormal findings: Secondary | ICD-10-CM | POA: Diagnosis not present

## 2023-08-09 NOTE — Progress Notes (Signed)
 Please attest and cosign this visit due to patients primary care provider not being in the office at the time the visit was completed.    Subjective:   Amber Miles is a 78 y.o. who presents for a Medicare Wellness preventive visit.  As a reminder, Annual Wellness Visits don't include a physical exam, and some assessments may be limited, especially if this visit is performed virtually. We may recommend an in-person follow-up visit with your provider if needed.  Visit Complete: Virtual I connected with  Amber Miles on 08/09/23 by a audio enabled telemedicine application and verified that I am speaking with the correct person using two identifiers.  Patient Location: Home  Provider Location: Home Office  I discussed the limitations of evaluation and management by telemedicine. The patient expressed understanding and agreed to proceed.  Vital Signs: Because this visit was a virtual/telehealth visit, some criteria may be missing or patient reported. Any vitals not documented were not able to be obtained and vitals that have been documented are patient reported.  VideoDeclined- This patient declined Librarian, academic. Therefore the visit was completed with audio only.  Persons Participating in Visit: Patient.  AWV Questionnaire: No: Patient Medicare AWV questionnaire was not completed prior to this visit.  Cardiac Risk Factors include: advanced age (>47men, >37 women);dyslipidemia;hypertension;obesity (BMI >30kg/m2)     Objective:    Today's Vitals   08/09/23 1344 08/09/23 1345  Weight: 212 lb (96.2 kg)   Height: 5' 7 (1.702 m)   PainSc:  3    Body mass index is 33.2 kg/m.     08/09/2023    1:58 PM 10/22/2022   12:58 PM 08/08/2022   11:26 AM 04/10/2022    2:05 PM 08/04/2021    2:14 PM 08/03/2020    1:20 PM 07/22/2020    2:08 PM  Advanced Directives  Does Patient Have a Medical Advance Directive? Yes No Yes Yes Yes Yes Yes  Type of Sports coach of [place];Living will  Healthcare Power of [place];Living will Healthcare Power of [place];Living will Healthcare Power of [place];Living will Healthcare Power of [place];Living will Healthcare Power of [place];Living will  Does patient want to make changes to medical advance directive?    No - Patient declined No - Patient declined  No - Patient declined  Copy of Healthcare Power of Attorney in Chart? No - copy requested  No - copy requested No - copy requested No - copy requested No - copy requested     Current Medications (verified) Outpatient Encounter Medications as of 08/09/2023  Medication Sig   acyclovir (ZOVIRAX) 400 MG tablet Take 400 mg by mouth 3 (three) times daily. X 3 days for flares   albuterol  (VENTOLIN  HFA) 108 (90 Base) MCG/ACT inhaler INHALE 1 TO 2 PUFFS EVERY 4 TO 6 HOURS AS NEEDED FOR WHEEZE OR COUGH   ARNUITY ELLIPTA 200 MCG/ACT AEPB Inhale 1 puff into the lungs daily.   azelastine (ASTELIN) 0.1 % nasal spray Place 1 spray into both nostrils 2 (two) times daily.   cetirizine (ZYRTEC) 5 MG tablet Take 5 mg by mouth daily as needed.   Docusate Calcium (STOOL SOFTENER PO) Take 1 tablet by mouth daily as needed.   esomeprazole  (NEXIUM ) 40 MG capsule TAKE 1 CAPSULE BY MOUTH EVERY DAY BEFORE BREAKFAST   estradiol  (VIVELLE -DOT) 0.0375 MG/24HR PLACE 1 PATCH ONTO THE SKIN 2 TIMES A WEEK.   furosemide  (LASIX ) 20 MG tablet TAKE 1/2 TABLET BY MOUTH DAILY  GLUCOSAMINE-CHONDROITIN-VIT D3 PO Take 1 capsule by mouth 2 (two) times daily.   levothyroxine (SYNTHROID) 112 MCG tablet Take 112 mcg by mouth daily before breakfast.   losartan  (COZAAR ) 50 MG tablet TAKE 1 TABLET BY MOUTH EVERY DAY   methocarbamol  (ROBAXIN ) 500 MG tablet TAKE 1 TABLET BY MOUTH 3 TIMES DAILY AS NEEDED FOR MUSCLE SPASMS.   montelukast (SINGULAIR) 10 MG tablet Take 10 mg by mouth daily.    potassium chloride  SA (KLOR-CON  M) 20 MEQ tablet TAKE 1 TABLET BY MOUTH EVERY DAY   silver   sulfADIAZINE  (SILVADENE ) 1 % cream Apply 1 Application topically daily.   traMADol  (ULTRAM ) 50 MG tablet TAKE 1-2 TABLETS BY MOUTH EVERY 8 HOURS AS NEEDED FOR SEVERE(SHOULDER PAIN). CAUTION OF SEDATION   triamterene -hydrochlorothiazide (MAXZIDE-25) 37.5-25 MG tablet TAKE 2 TABLETS BY MOUTH EVERY DAY (Patient taking differently: Take 0.5 tablets by mouth daily.)   verapamil  (CALAN -SR) 240 MG CR tablet TAKE 1 TABLET (240 MG TOTAL) BY MOUTH 2 (TWO) TIMES DAILY.   Multiple Vitamin (MULTIVITAMIN) tablet Take 1 tablet by mouth daily.     ondansetron  (ZOFRAN -ODT) 8 MG disintegrating tablet Take 1 tablet (8 mg total) by mouth every 8 (eight) hours as needed for nausea or vomiting. Caution of sedation (Patient not taking: Reported on 08/09/2023)   predniSONE  (DELTASONE ) 20 MG tablet 2 tabs po daily for 5 days, then 1 tab po daily for 5 days (Patient not taking: Reported on 08/09/2023)   No facility-administered encounter medications on file as of 08/09/2023.    Allergies (verified) Tape and Neosporin [neomycin -polymyxin-gramicidin]   History: Past Medical History:  Diagnosis Date   Allergy    allergic rhinitis   Anxiety    Arthritis    OA knee replacement   Cataract    Degenerative disc disease, thoracic    Family history of breast cancer    Family history of genetic disease carrier    paternal cousin has NBN mutation   GERD (gastroesophageal reflux disease)    Hyperlipidemia    Hypertension    Hypothyroid    Reactive airways dysfunction syndrome (HCC)    Skin lesions, generalized    squamous cell skin lesions and basal cell skin lesions   Past Surgical History:  Procedure Laterality Date   ABDOMINAL HYSTERECTOMY  1990s   hyst with oophrectomy for B9lesion/cyst and prolapse   BREAST BIOPSY Bilateral    benign   JOINT REPLACEMENT  05/2009   left total knee replacement   recocele     and cystocele repair   TONSILLECTOMY     tsa     Family History  Problem Relation Age of Onset    Hypertension Mother    Stroke Mother    Depression Mother    Alzheimer's disease Mother    Heart disease Father    Cerebral palsy Father    Hypertension Sister    Breast cancer Cousin 71       NBN mutation   Melanoma Maternal Uncle        dx >50   Social History   Socioeconomic History   Marital status: Widowed    Spouse name: Not on file   Number of children: Not on file   Years of education: Not on file   Highest education level: Not on file  Occupational History   Not on file  Tobacco Use   Smoking status: Never   Smokeless tobacco: Never  Vaping Use   Vaping status: Never Used  Substance and Sexual Activity  Alcohol use: No    Alcohol/week: 0.0 standard drinks of alcohol   Drug use: No   Sexual activity: Not Currently  Other Topics Concern   Not on file  Social History Narrative   Not on file   Social Drivers of Health   Financial Resource Strain: Low Risk  (08/09/2023)   Overall Financial Resource Strain (CARDIA)    Difficulty of Paying Living Expenses: Not hard at all  Food Insecurity: No Food Insecurity (08/09/2023)   Hunger Vital Sign    Worried About Running Out of Food in the Last Year: Never true    Ran Out of Food in the Last Year: Never true  Transportation Needs: No Transportation Needs (08/09/2023)   PRAPARE - Administrator, Civil Service (Medical): No    Lack of Transportation (Non-Medical): No  Physical Activity: Sufficiently Active (08/09/2023)   Exercise Vital Sign    Days of Exercise per Week: 7 days    Minutes of Exercise per Session: 60 min  Stress: No Stress Concern Present (08/09/2023)   [employer] of Occupational Health - Occupational Stress Questionnaire    Feeling of Stress: Not at all  Social Connections: Moderately Integrated (08/09/2023)   Social Connection and Isolation Panel    Frequency of Communication with Friends and Family: More than three times a week    Frequency of Social Gatherings with Friends and  Family: More than three times a week    Attends Religious Services: More than 4 times per year    Active Member of [employer] or Organizations: Yes    Attends Banker Meetings: More than 4 times per year    Marital Status: Widowed    Tobacco Counseling Counseling given: Not Answered  Clinical Intake:  Pre-visit preparation completed: Yes  Pain : 0-10 Pain Score: 3  Pain Location: Shoulder Pain Descriptors / Indicators: Aching Pain Onset: More than a month ago Pain Frequency: Intermittent Pain Relieving Factors: heat, medications Effect of Pain on Daily Activities: limits activities:have to rest  Pain Relieving Factors: heat, medications  BMI - recorded: 33.2 Nutritional Status: BMI > 30  Obese Nutritional Risks: None Diabetes: No  Lab Results  Component Value Date   HGBA1C 5.7 10/06/2022   HGBA1C 6.0 10/03/2021   HGBA1C 6.0 08/04/2020     How often do you need to have someone help you when you read instructions, pamphlets, or other written materials from your doctor or pharmacy?: 1 - Never  Interpreter Needed?: No  Comments: lives alone Information entered by :: B.Valissa Lyvers,LPN   Activities of Daily Living     08/09/2023    2:00 PM  In your present state of health, do you have any difficulty performing the following activities:  Hearing? 0  Vision? 0  Difficulty concentrating or making decisions? 0  Walking or climbing stairs? 0  Dressing or bathing? 0  Doing errands, shopping? 0  Preparing Food and eating ? N  Using the Toilet? N  In the past six months, have you accidently leaked urine? N  Do you have problems with loss of bowel control? N  Managing your Medications? N  Managing your Finances? N  Housekeeping or managing your Housekeeping? N    Patient Care Team: Tower, Laine LABOR, MD as PCP - Diedre Geraldene Loving, MD as Consulting Physician (Ophthalmology)  I have updated your Care Teams any recent Medical Services you may have received  from other providers in the past year.     Assessment:  This is a routine wellness examination for [place].  Hearing/Vision screen Hearing Screening - Comments:: Pt says her hearing is good Vision Screening - Comments:: Pt says her vision is good w/glasses Dr Geraldene   Goals Addressed               This Visit's Progress     COMPLETED: Patient Stated   On track     Starting 07/15/2018, I will continue to take medications as prescribed.       COMPLETED: Patient Stated        07/22/2019, I will maintain and continue medications as prescribed.       Patient Stated   On track     08/09/23- I will maintain and continue medications as prescribed.      Patient Stated   On track     08/09/23-Stay active       Stay healthy (pt-stated)   On track     08/09/23       Depression Screen     08/09/2023    1:54 PM 10/27/2022   10:38 AM 10/13/2022    2:31 PM 10/02/2022   10:48 AM 08/08/2022   11:24 AM 06/19/2022    3:03 PM 05/22/2022    3:37 PM  PHQ 2/9 Scores  PHQ - 2 Score 0 0 0 0 0 0 0  PHQ- 9 Score  0 0 0       Fall Risk     08/09/2023    1:50 PM 10/27/2022   10:38 AM 10/13/2022    2:32 PM 10/02/2022   10:49 AM 08/08/2022   11:27 AM  Fall Risk   Falls in the past year? 1 1 1 1  0  Number falls in past yr: 1 0 0 0 0  Injury with Fall? 1 1 1 1  0  Risk for fall due to : Impaired mobility History of fall(s) No Fall Risks Other (Comment) No Fall Risks  Follow up Education provided;Falls prevention discussed Falls evaluation completed Falls evaluation completed Falls evaluation completed Falls prevention discussed;Falls evaluation completed    MEDICARE RISK AT HOME:  Medicare Risk at Home Any stairs in or around the home?: Yes (ramp) If so, are there any without handrails?: Yes Home free of loose throw rugs in walkways, pet beds, electrical cords, etc?: Yes Adequate lighting in your home to reduce risk of falls?: Yes Life alert?: No Use of a cane, walker or w/c?: Yes Grab bars  in the bathroom?: Yes Shower chair or bench in shower?: Yes Elevated toilet seat or a handicapped toilet?: Yes  TIMED UP AND GO:  Was the test performed?  No  Cognitive Function: 6CIT completed    08/03/2020    1:25 PM 07/22/2019    2:49 PM 07/15/2018   11:12 AM 07/02/2017    9:09 AM 06/23/2016    9:39 AM  MMSE - Mini Mental State Exam  Orientation to time 5 5 5 5 5    Orientation to Place 5 5 5 5 5    Registration 3 3 3 3 3    Attention/ Calculation 5 5 0 0 0   Recall 3 3 3 3 3    Language- name 2 objects   0 0 0   Language- repeat 1 1 1 1 1   Language- follow 3 step command   0 3 3   Language- read & follow direction   0 0 0   Write a sentence   0 0 0   Copy design  0 0 0   Total score   17 20 20       Data saved with a previous flowsheet row definition        08/09/2023    2:02 PM 08/08/2022   11:29 AM 08/04/2021    2:15 PM  6CIT Screen  What Year? 0 points 0 points 0 points  What month? 0 points 0 points 0 points  What time? 0 points 0 points 0 points  Count back from 20 0 points 0 points 0 points  Months in reverse 0 points 0 points 0 points  Repeat phrase 0 points 0 points 0 points  Total Score 0 points 0 points 0 points    Immunizations Immunization History  Administered Date(s) Administered   Fluad Quad(high Dose 65+) 10/09/2019, 10/05/2021   Influenza Split 10/18/2010   Influenza Whole 11/30/2006, 10/28/2008, 10/27/2009   Influenza, High Dose Seasonal PF 11/11/2015, 10/05/2016, 10/18/2017, 11/11/2018, 10/29/2020, 10/19/2022   Influenza-Unspecified 10/07/2012, 08/30/2013, 10/01/2014   PFIZER Comirnaty(Gray Top)Covid-19 Tri-Sucrose Vaccine 06/09/2020   PFIZER(Purple Top)SARS-COV-2 Vaccination 02/04/2019, 02/24/2019, 10/20/2019   PNEUMOCOCCAL CONJUGATE-20 11/13/2022   Pfizer Covid-19 Vaccine Bivalent Booster 55yrs & up 11/02/2020   Pfizer(Comirnaty)Fall Seasonal Vaccine 12 years and older 11/14/2021, 10/19/2022   Pneumococcal Conjugate-13 05/27/2014   Pneumococcal  Polysaccharide-23 12/04/2011, 10/07/2015, 10/05/2016, 10/04/2017, 05/15/2019, 05/06/2020, 05/12/2021   Respiratory Syncytial Virus Vaccine,Recomb Aduvanted(Arexvy) 11/28/2021   Td 12/04/2011   Tdap 10/02/2022   Zoster Recombinant(Shingrix) 08/06/2019, 10/09/2019   Zoster, Live 10/05/2010    Screening Tests Health Maintenance  Topic Date Due   COVID-19 Vaccine (8 - Pfizer risk 2024-25 season) 03/13/2024 (Originally 04/19/2023)   INFLUENZA VACCINE  08/17/2023   MAMMOGRAM  12/07/2023   Medicare Annual Wellness (AWV)  08/08/2024   DTaP/Tdap/Td (3 - Td or Tdap) 10/01/2032   Pneumococcal Vaccine: 50+ Years  Completed   DEXA SCAN  Completed   Hepatitis C Screening  Completed   Zoster Vaccines- Shingrix  Completed   Hepatitis B Vaccines  Aged Out   HPV VACCINES  Aged Out   Meningococcal B Vaccine  Aged Out   Colonoscopy  Discontinued    Health Maintenance  There are no preventive care reminders to display for this patient.  Health Maintenance Items Addressed:   Additional Screening:  Vision Screening: Recommended annual ophthalmology exams for early detection of glaucoma and other disorders of the eye. Would you like a referral to an eye doctor? No    Dental Screening: Recommended annual dental exams for proper oral hygiene  Community Resource Referral / Chronic Care Management: CRR required this visit?  No   CCM required this visit?  No   Plan:    I have personally reviewed and noted the following in the patient's chart:   Medical and social history Use of alcohol, tobacco or illicit drugs  Current medications and supplements including opioid prescriptions. Patient is currently taking opioid prescriptions. Information provided to patient regarding non-opioid alternatives. Patient advised to discuss non-opioid treatment plan with their provider. Functional ability and status Nutritional status Physical activity Advanced directives List of other  physicians Hospitalizations, surgeries, and ER visits in previous 12 months Vitals Screenings to include cognitive, depression, and falls Referrals and appointments  In addition, I have reviewed and discussed with patient certain preventive protocols, quality metrics, and best practice recommendations. A written personalized care plan for preventive services as well as general preventive health recommendations were provided to patient.   Erminio LITTIE Saris, LPN   2/75/7974   After Visit Summary: (  Declined) Due to this being a telephonic visit, with patients personalized plan was offered to patient but patient Declined AVS at this time   Notes: Nothing significant to report at this time.

## 2023-08-09 NOTE — Patient Instructions (Signed)
 Amber Miles , Thank you for taking time out of your busy schedule to complete your Annual Wellness Visit with me. I enjoyed our conversation and look forward to speaking with you again next year. I, as well as your care team,  appreciate your ongoing commitment to your health goals. Please review the following plan we discussed and let me know if I can assist you in the future. Your Game plan/ To Do List     Follow up Visits: Next Medicare AWV with our clinical staff: 08/11/24 @1 :40pm televisit   Have you seen your provider in the last 6 months (3 months if uncontrolled diabetes)? Yes Next Office Visit with your provider: 10/23/23 physical  Clinician Recommendations:  Aim for 30 minutes of exercise or brisk walking, 6-8 glasses of water, and 5 servings of fruits and vegetables each day.       This is a list of the screening recommended for you and due dates:  Health Maintenance  Topic Date Due   COVID-19 Vaccine (8 - Pfizer risk 2024-25 season) 03/13/2024*   Flu Shot  08/17/2023   Mammogram  12/07/2023   Medicare Annual Wellness Visit  08/08/2024   DTaP/Tdap/Td vaccine (3 - Td or Tdap) 10/01/2032   Pneumococcal Vaccine for age over 70  Completed   DEXA scan (bone density measurement)  Completed   Hepatitis C Screening  Completed   Zoster (Shingles) Vaccine  Completed   Hepatitis B Vaccine  Aged Out   HPV Vaccine  Aged Out   Meningitis B Vaccine  Aged Out   Colon Cancer Screening  Discontinued  *Topic was postponed. The date shown is not the original due date.    Advanced directives: (Copy Requested) Please bring a copy of your health care power of attorney and living will to the office to be added to your chart at your convenience. You can mail to [facility] [address] [place], [place] [address] or email to [email or web address] Advance Care Planning is important because it:  [x]  Makes sure you receive the medical care that is consistent with your values,  goals, and preferences  [x]  It provides guidance to your family and loved ones and reduces their decisional burden about whether or not they are making the right decisions based on your wishes.  Follow the link provided in your after visit summary or read over the paperwork we have mailed to you to help you started getting your Advance Directives in place. If you need assistance in completing these, please reach out to us  so that we can help you!

## 2023-09-19 IMAGING — US US BREAST*L* LIMITED INC AXILLA
1 series · 13 of 13 positions shown · non-contrast
Comparison: Previous exam(s).

CLINICAL DATA: Patient recalled from screening for left breast
asymmetry.

EXAM:
DIGITAL DIAGNOSTIC UNILATERAL LEFT MAMMOGRAM WITH TOMOSYNTHESIS AND
CAD; ULTRASOUND LEFT BREAST LIMITED
TECHNIQUE: Left digital diagnostic mammography and breast tomosynthesis was
performed. The images were evaluated with computer-aided detection.;
Targeted ultrasound examination of the left breast was performed.

[Series 1: us breast*left* limited inc axilla · 0.06mm/px · 13 of 13 slices shown]
[im 1/13]
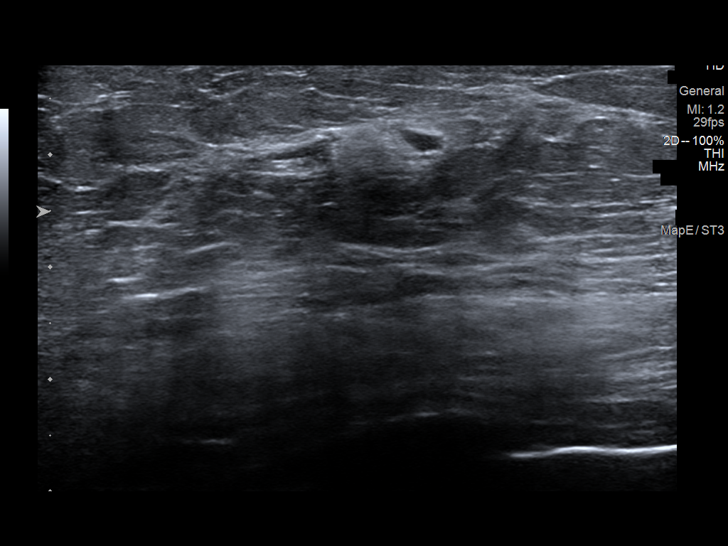
[im 2/13]
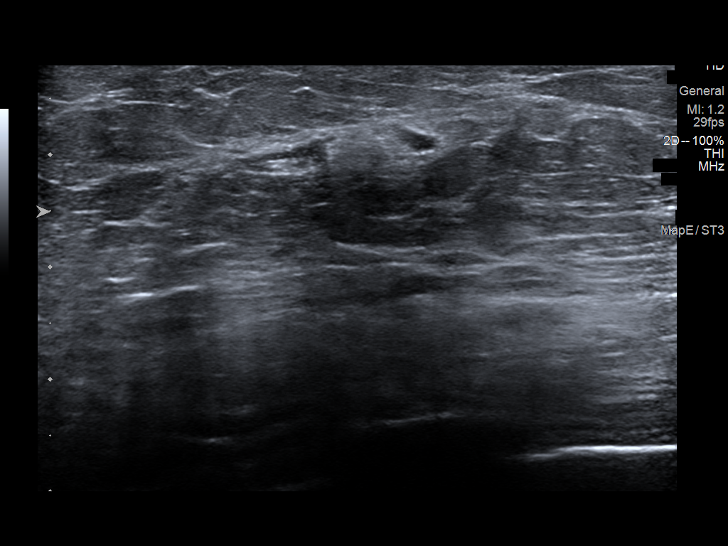
[im 3/13]
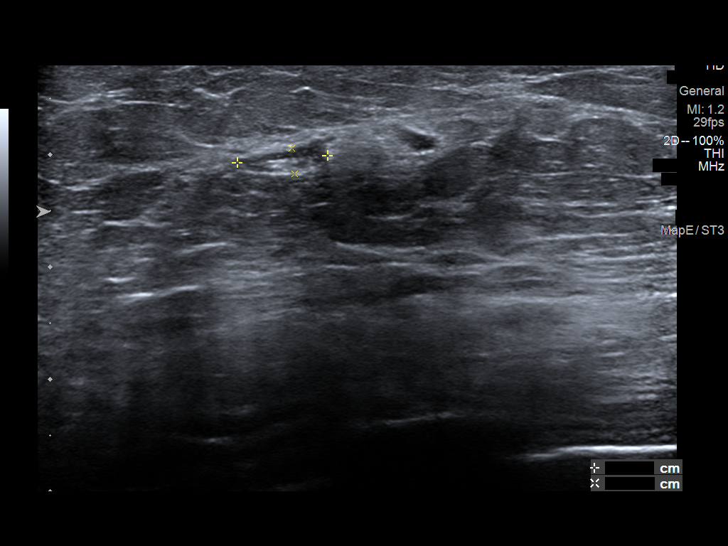
[im 4/13]
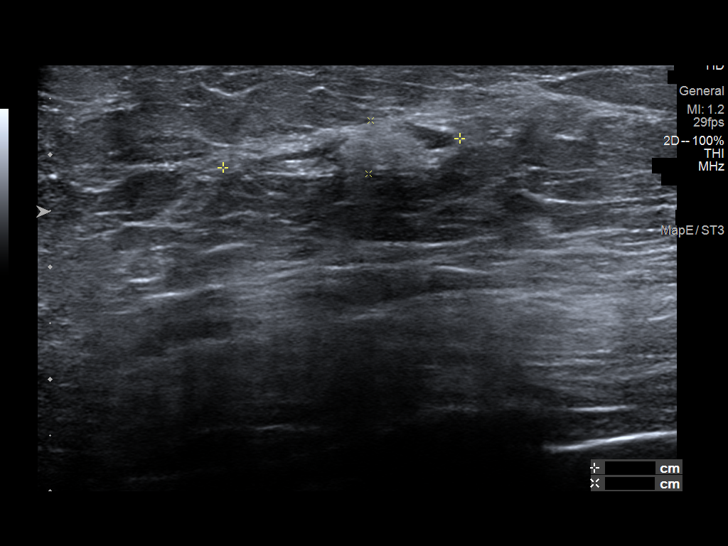
[im 5/13]
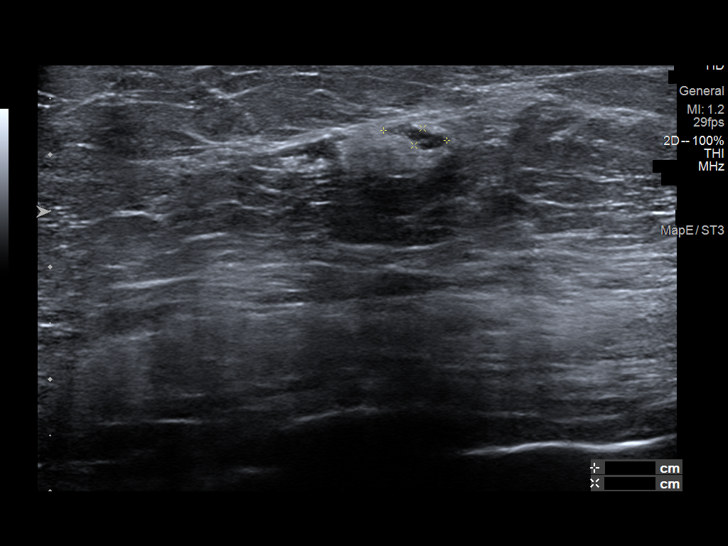
[im 6/13]
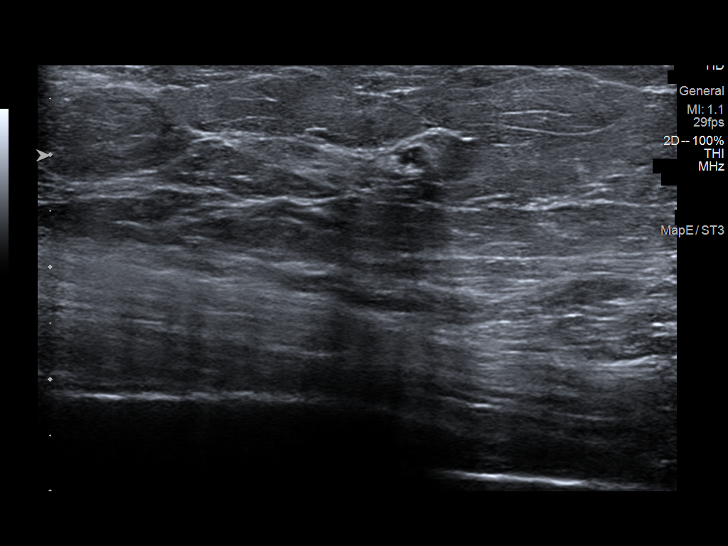
[im 7/13]
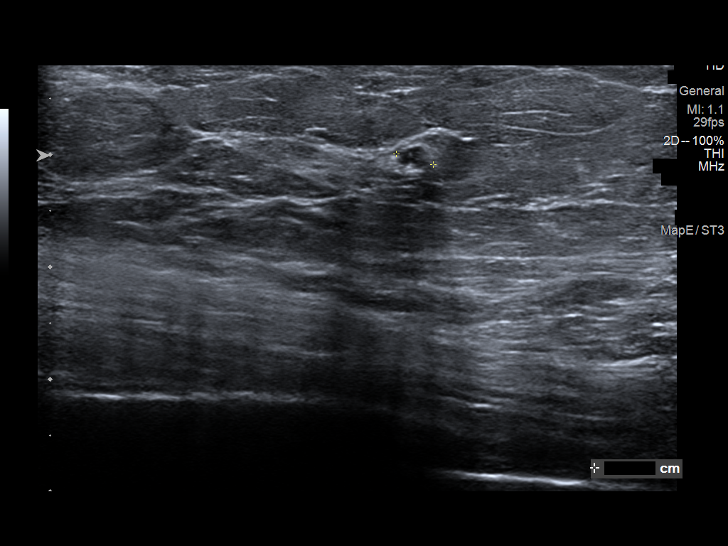
[im 8/13]
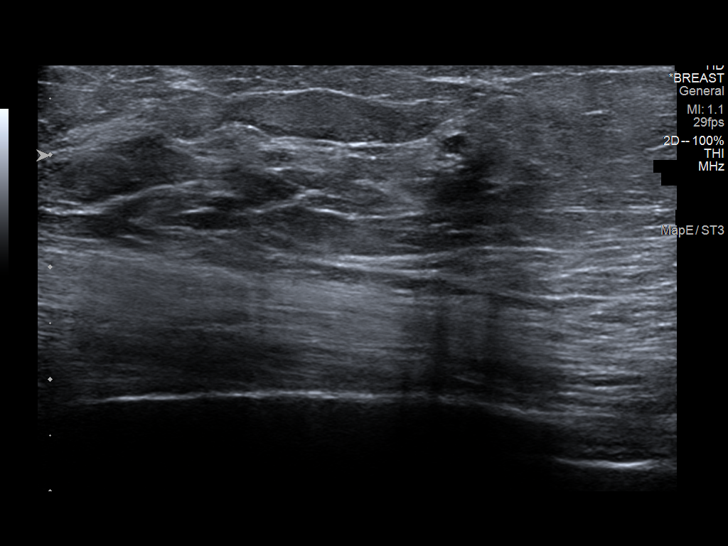
[im 9/13]
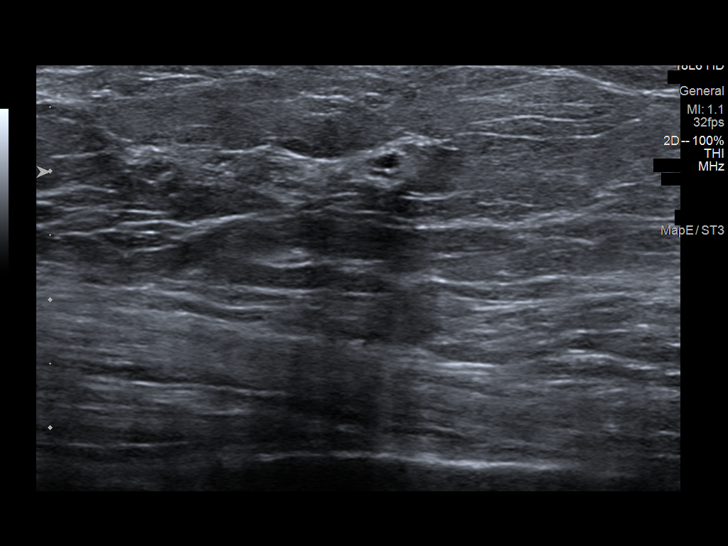
[im 10/13]
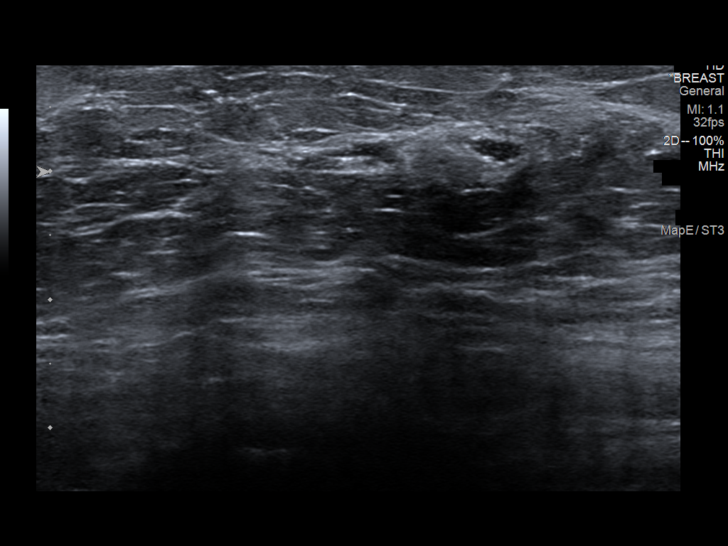
[im 11/13]
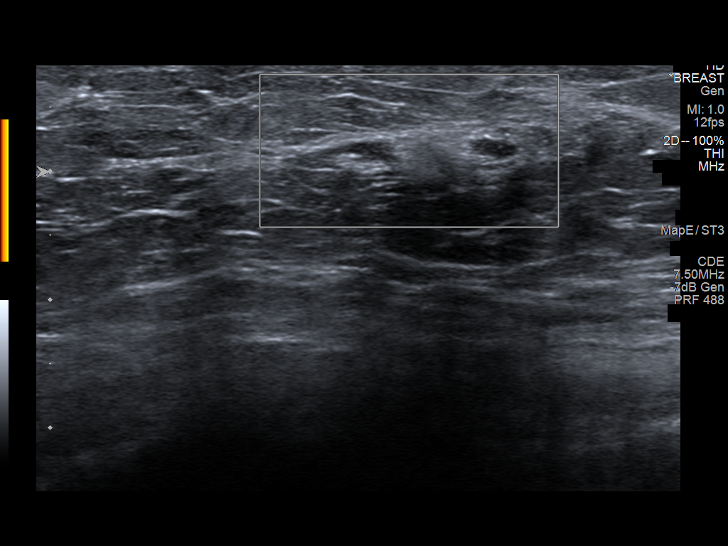
[im 12/13]
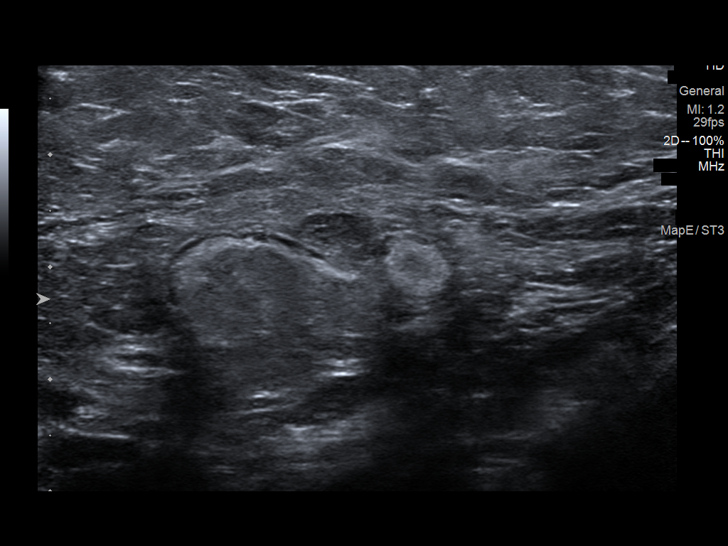
[im 13/13]
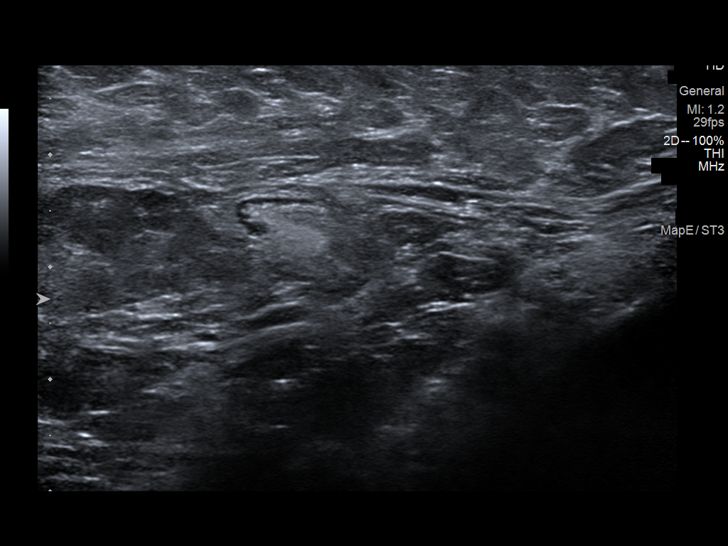

[13 of 13 positions shown; findings below may reference images not displayed]

ACR Breast Density Category c: The breast tissue is heterogeneously
dense, which may obscure small masses.
FINDINGS: There is a persistent low-density circumscribed mass within the
upper-outer left breast with associated calcifications. There is
suggestion of layering of these calcifications on the true lateral
view.

On physical exam, no discrete mass is palpated within the
upper-outer left breast.

Targeted ultrasound is performed, showing an 8 x 2 mm probable
cluster of cysts left breast 2 o'clock position 8 cm from nipple.
There is an adjacent 6 x 2 x 3 mm probable cluster of cysts left
breast 2 o'clock position 8 cm from the nipple. No left axillary
adenopathy.
IMPRESSION: Probably benign left breast mass favored to represent a cluster of
cysts with associated calcifications, likely milk of calcium.

RECOMMENDATION:
Left breast diagnostic mammogram and ultrasound in 6 months to
reassess probably benign left breast mass and calcifications.

I have discussed the findings and recommendations with the patient.
If applicable, a reminder letter will be sent to the patient
regarding the next appointment.

BI-RADS CATEGORY  3: Probably benign.

## 2023-09-19 IMAGING — MG MM DIGITAL DIAGNOSTIC UNILAT*L* W/ TOMO W/ CAD
6 series · 6 of 14 positions shown · non-contrast
Comparison: Previous exam(s).

CLINICAL DATA: Patient recalled from screening for left breast
asymmetry.

EXAM:
DIGITAL DIAGNOSTIC UNILATERAL LEFT MAMMOGRAM WITH TOMOSYNTHESIS AND
CAD; ULTRASOUND LEFT BREAST LIMITED
TECHNIQUE: Left digital diagnostic mammography and breast tomosynthesis was
performed. The images were evaluated with computer-aided detection.;
Targeted ultrasound examination of the left breast was performed.

[L CC]
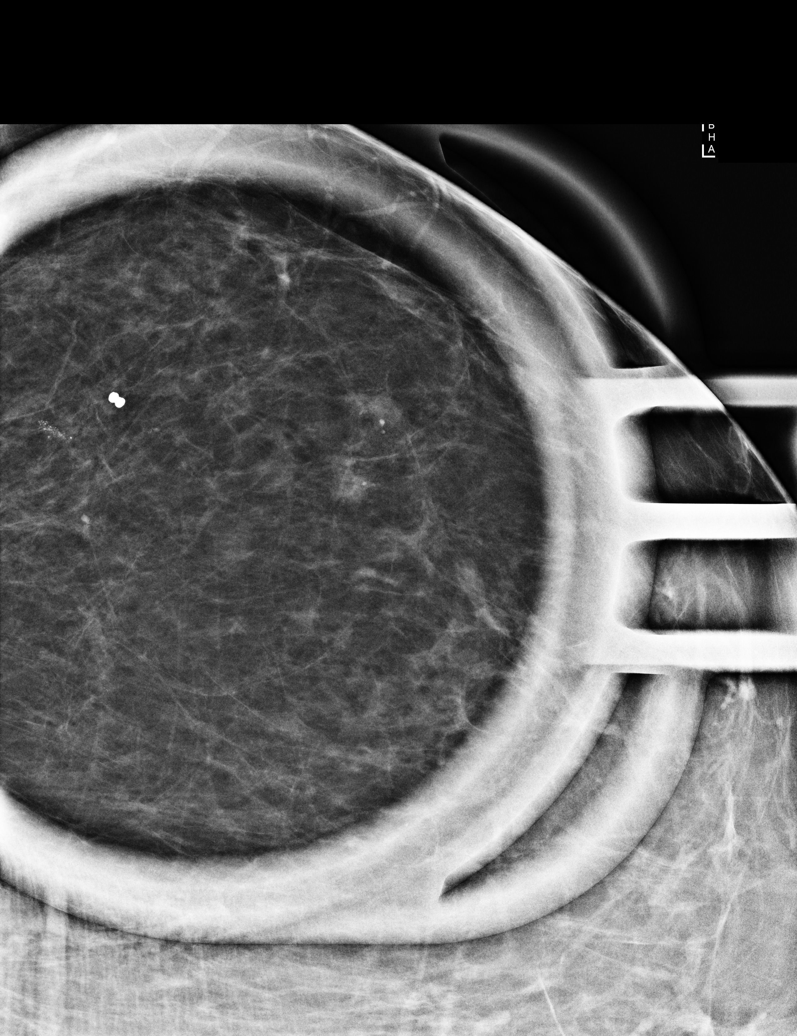

[L ML]
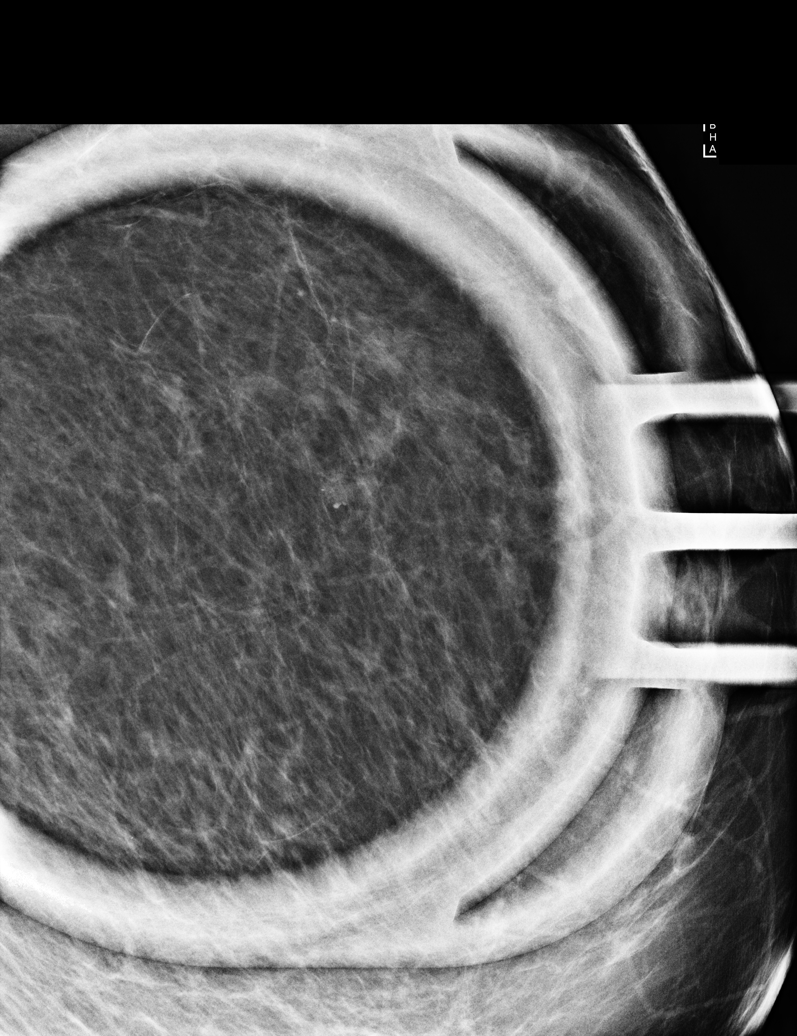

[L ML synth-2D]
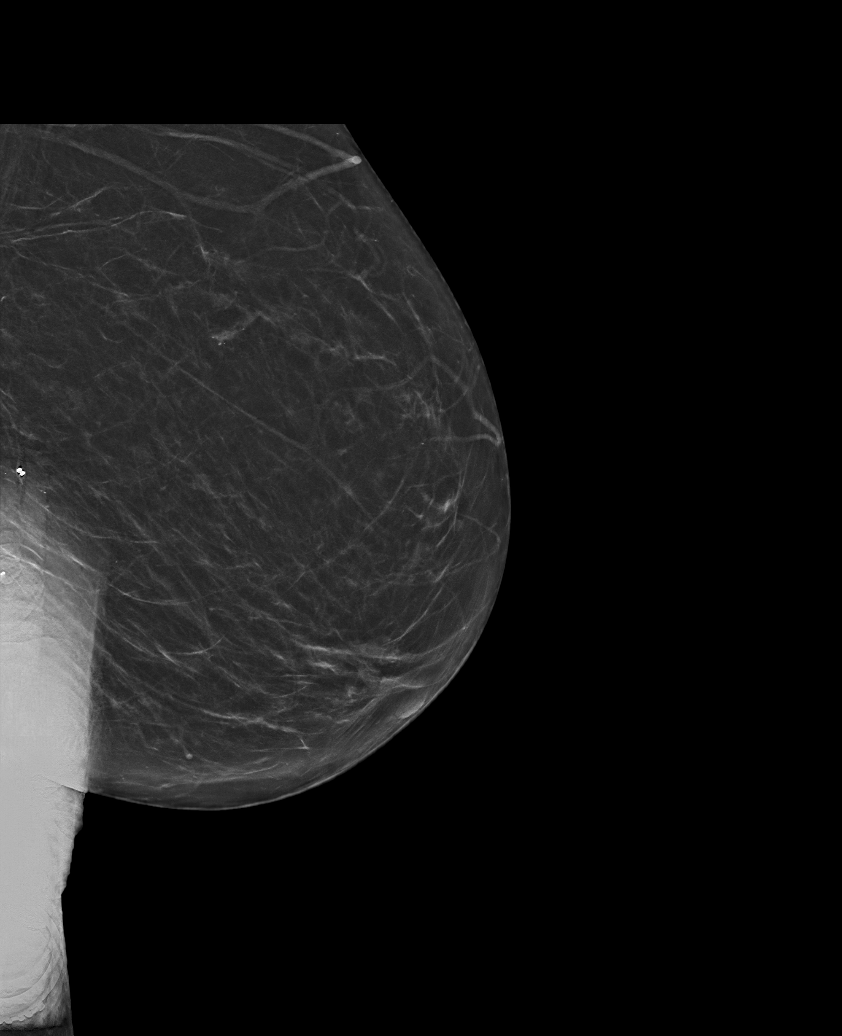

[L MLO synth-2D]
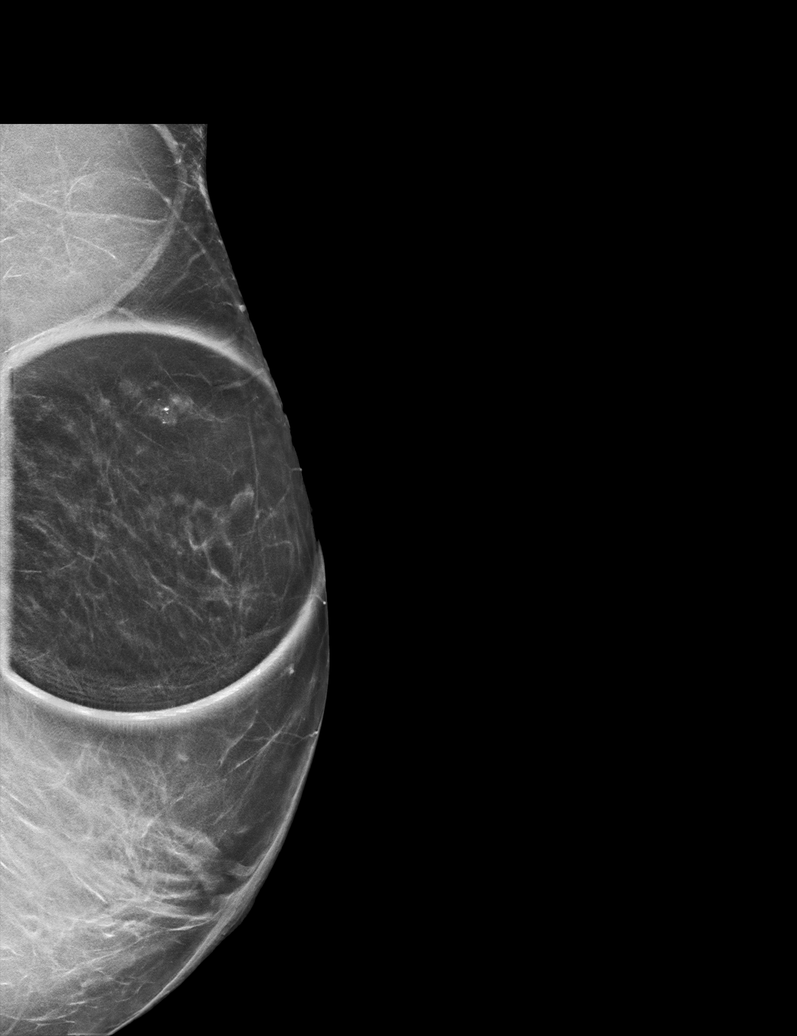

[L ML tomo · tomo slice 32/63.0]
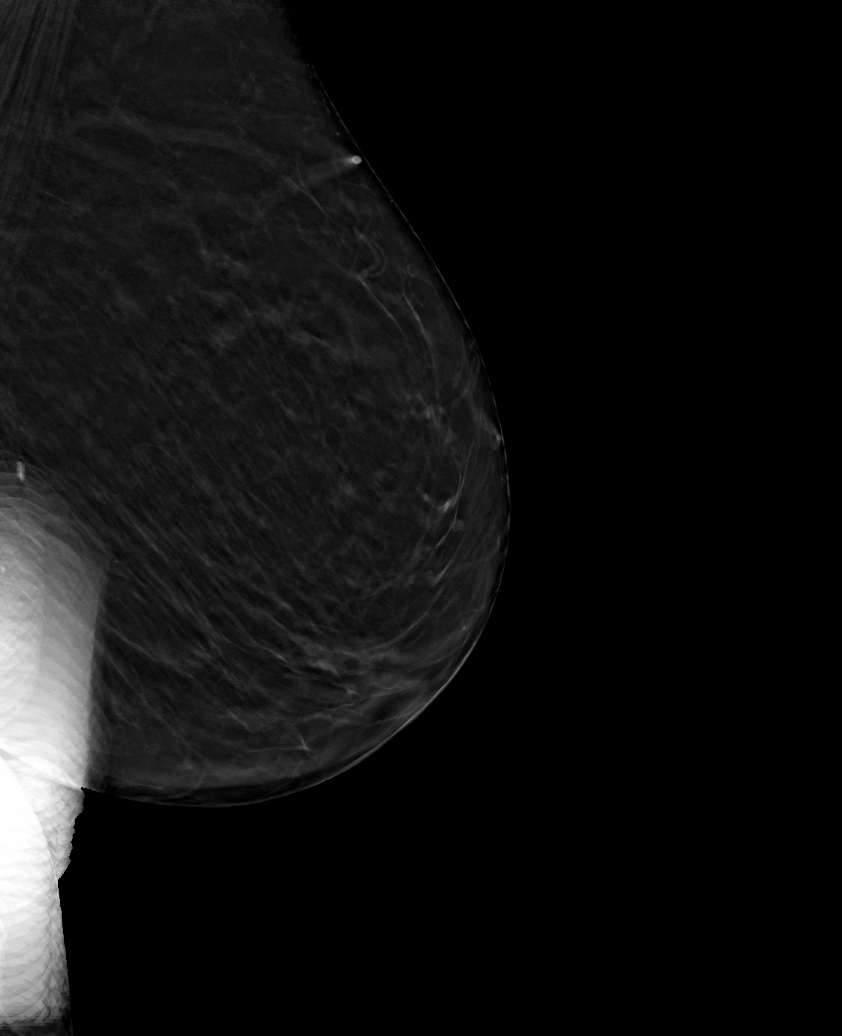

[L MLO tomo · tomo slice 29/56.0]
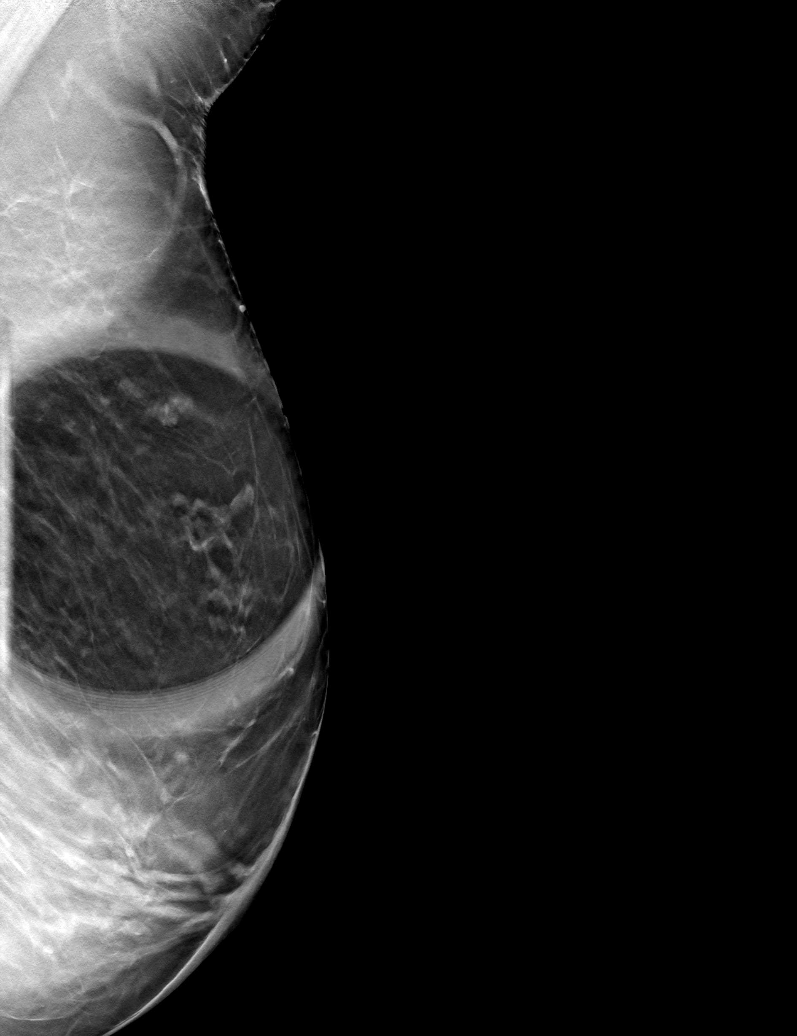

[6 of 14 positions shown; findings below may reference images not displayed]

ACR Breast Density Category c: The breast tissue is heterogeneously
dense, which may obscure small masses.
FINDINGS: There is a persistent low-density circumscribed mass within the
upper-outer left breast with associated calcifications. There is
suggestion of layering of these calcifications on the true lateral
view.

On physical exam, no discrete mass is palpated within the
upper-outer left breast.

Targeted ultrasound is performed, showing an 8 x 2 mm probable
cluster of cysts left breast 2 o'clock position 8 cm from nipple.
There is an adjacent 6 x 2 x 3 mm probable cluster of cysts left
breast 2 o'clock position 8 cm from the nipple. No left axillary
adenopathy.
IMPRESSION: Probably benign left breast mass favored to represent a cluster of
cysts with associated calcifications, likely milk of calcium.

RECOMMENDATION:
Left breast diagnostic mammogram and ultrasound in 6 months to
reassess probably benign left breast mass and calcifications.

I have discussed the findings and recommendations with the patient.
If applicable, a reminder letter will be sent to the patient
regarding the next appointment.

BI-RADS CATEGORY  3: Probably benign.

## 2023-09-25 ENCOUNTER — Ambulatory Visit (INDEPENDENT_AMBULATORY_CARE_PROVIDER_SITE_OTHER): Admitting: Family Medicine

## 2023-09-25 ENCOUNTER — Encounter: Payer: Self-pay | Admitting: Family Medicine

## 2023-09-25 VITALS — BP 133/65 | HR 62 | Temp 97.6°F | Ht 67.0 in | Wt 204.1 lb

## 2023-09-25 DIAGNOSIS — M7061 Trochanteric bursitis, right hip: Secondary | ICD-10-CM | POA: Insufficient documentation

## 2023-09-25 NOTE — Assessment & Plan Note (Addendum)
 Recurrent  Saw Dr Watt 2 y ago and injection helped Will schedule appointment with him  Reassuring exam, some tenderness of greater trochanter  Encouraged not to use oral nsaid (in past GFR dec)  Trial of voltaren gel up to four times daily  Also encouraged use of ice/ cold pack for 10 minutes Avoid activities that hurt   Call back and Er precautions noted in detail today

## 2023-09-25 NOTE — Progress Notes (Signed)
 Subjective:    Patient ID: Amber Miles, female    DOB: 24-Jun-1945, 78 y.o.   MRN: [id number]  HPI  Wt Readings from Last 3 Encounters:  09/25/23 204 lb 2 oz (92.6 kg)  08/09/23 212 lb (96.2 kg)  05/01/23 212 lb (96.2 kg)   31.97 kg/m  Vitals:   09/25/23 0929 09/25/23 0951  BP: (!) 150/74 133/65  Pulse: 62   Temp: 97.6 F (36.4 C)   SpO2: 96%    Lost some more weight  Eats less/less appetite    Pt presents for  Right hip pain   Hip started hurting again 2 month ago  No trauma or new activity  Outside of hip /lateral  Feels a knot and massages it  Heat helps /heating pad    Also shoulders hurt /baseline   Taking tylenol  Some aleve  Last tramadol  was labor day  Methocarbamol  is on med list -takes regularly       As eval for hip pain by Dr Watt 2 y ago Noted: Known multilevel degenerative disc disease, and this is certainly playing a role. I think some inherent weakness is also likely playing a role. With abnormal movement mechanics, it is quite common to have some various overuse bursitis, and it seems like she has some very significant trochanteric bursitis right now. I think is reasonable to try to get this to calm down with a direct intrabursal injection.   Injection did help at that time      Last hip film was 08/2015  Associated Dx: Right hip pain    Study Result CLINICAL DATA: Right hip pain, no history of trauma  EXAM: DG HIP (WITH OR WITHOUT PELVIS) 2-3V RIGHT  COMPARISON: Limited views of the right hip from a lumbar spine series of October 11, 2012  FINDINGS: The bony pelvis is subjectively adequately mineralized. There is no lytic or blastic lesion. AP and lateral views of the right hip reveal preservation of the joint space. The articular surfaces of the femoral head and acetabulum remain smoothly rounded. The femoral neck, intertrochanteric, and subtrochanteric regions are normal.  IMPRESSION: There is no acute or  significant chronic bony abnormality of the right hip.   Lab Results  Component Value Date   NA 135 02/26/2023   K 4.0 02/26/2023   CO2 29 02/26/2023   GLUCOSE 92 02/26/2023   BUN 14 02/26/2023   CREATININE 0.87 02/26/2023   CALCIUM 9.8 02/26/2023   GFR 63.94 02/26/2023   GFRNONAA 55 (L) 08/26/2012      Patient Active Problem List   Diagnosis Date Noted   Trochanteric bursitis of right hip 09/25/2023   Open wound of left hand 04/10/2023   CAP (community acquired pneumonia) 04/10/2023   Muscle spasm 04/10/2023   Skin ulcer of hand (HCC) 03/15/2023   Gastroenteritis 03/14/2023   Low iron 03/04/2023   History of UTI 10/27/2022   Elevated platelet count 10/15/2022   Hormone replacement therapy (HRT) 10/13/2022   Multiple thyroid  nodules 10/13/2022   Current use of proton pump inhibitor 10/05/2022   Decreased GFR 06/19/2022   Routine general medical examination at a health care facility 07/29/2019   Genetic testing 01/02/2018   Family history of breast cancer    Family history of genetic disease carrier    Meningioma (HCC) 07/17/2017   Fall 01/04/2017   Atrophic vaginitis 09/01/2015   Pedal edema 05/27/2014   Estrogen deficiency 02/21/2013   Encounter for Medicare annual wellness exam 02/21/2013  Low back pain potentially associated with radiculopathy 10/11/2012   Allergic rhinitis 01/28/2010   Prediabetes 07/30/2009   Anxiety state 11/30/2008   Hyperlipidemia 04/13/2008   Hypothyroidism 01/07/2008   HYPERTENSION, BENIGN 01/07/2008   OSTEOARTHRITIS, GENERALIZED 01/07/2008   Past Medical History:  Diagnosis Date   Allergy    allergic rhinitis   Anxiety    Arthritis    OA knee replacement   Cataract    Degenerative disc disease, thoracic    Family history of breast cancer    Family history of genetic disease carrier    paternal cousin has NBN mutation   GERD (gastroesophageal reflux disease)    Hyperlipidemia    Hypertension    Hypothyroid    Reactive  airways dysfunction syndrome (HCC)    Skin lesions, generalized    squamous cell skin lesions and basal cell skin lesions   Past Surgical History:  Procedure Laterality Date   ABDOMINAL HYSTERECTOMY  1990s   hyst with oophrectomy for B9lesion/cyst and prolapse   BREAST BIOPSY Bilateral    benign   JOINT REPLACEMENT  05/2009   left total knee replacement   recocele     and cystocele repair   TONSILLECTOMY     tsa     Social History   Tobacco Use   Smoking status: Never   Smokeless tobacco: Never  Vaping Use   Vaping status: Never Used  Substance Use Topics   Alcohol use: No    Alcohol/week: 0.0 standard drinks of alcohol   Drug use: No   Family History  Problem Relation Age of Onset   Hypertension Mother    Stroke Mother    Depression Mother    Alzheimer's disease Mother    Heart disease Father    Cerebral palsy Father    Hypertension Sister    Breast cancer Cousin 49       NBN mutation   Melanoma Maternal Uncle        dx >50   Allergies  Allergen Reactions   Tape Swelling    Itching and redness Pt said severe reaction to surgical tape.? latex   Neosporin [Neomycin -Polymyxin-Gramicidin] Other (See Comments)    blisters   Current Outpatient Medications on File Prior to Visit  Medication Sig Dispense Refill   acyclovir (ZOVIRAX) 400 MG tablet Take 400 mg by mouth 3 (three) times daily. X 3 days for flares     albuterol  (VENTOLIN  HFA) 108 (90 Base) MCG/ACT inhaler INHALE 1 TO 2 PUFFS EVERY 4 TO 6 HOURS AS NEEDED FOR WHEEZE OR COUGH 8.5 each 1   azelastine (ASTELIN) 0.1 % nasal spray Place 1 spray into both nostrils 2 (two) times daily.     cetirizine (ZYRTEC) 5 MG tablet Take 5 mg by mouth daily as needed.     Docusate Calcium (STOOL SOFTENER PO) Take 1 tablet by mouth daily as needed.     esomeprazole  (NEXIUM ) 40 MG capsule TAKE 1 CAPSULE BY MOUTH EVERY DAY BEFORE BREAKFAST 90 capsule 1   estradiol  (VIVELLE -DOT) 0.0375 MG/24HR PLACE 1 PATCH ONTO THE SKIN 2 TIMES  A WEEK. 24 patch 1   furosemide  (LASIX ) 20 MG tablet TAKE 1/2 TABLET BY MOUTH DAILY 45 tablet 1   GLUCOSAMINE-CHONDROITIN-VIT D3 PO Take 1 capsule by mouth 2 (two) times daily.     levothyroxine (SYNTHROID) 112 MCG tablet Take 112 mcg by mouth daily before breakfast.     losartan  (COZAAR ) 50 MG tablet TAKE 1 TABLET BY MOUTH EVERY DAY 90 tablet  1   methocarbamol  (ROBAXIN ) 500 MG tablet TAKE 1 TABLET BY MOUTH 3 TIMES DAILY AS NEEDED FOR MUSCLE SPASMS. 270 tablet 0   montelukast (SINGULAIR) 10 MG tablet Take 10 mg by mouth daily.      Multiple Vitamin (MULTIVITAMIN) tablet Take 1 tablet by mouth daily.       potassium chloride  SA (KLOR-CON  M) 20 MEQ tablet TAKE 1 TABLET BY MOUTH EVERY DAY 90 tablet 0   QVAR REDIHALER 80 MCG/ACT inhaler Inhale 2 puffs into the lungs 2 (two) times daily.     traMADol  (ULTRAM ) 50 MG tablet TAKE 1-2 TABLETS BY MOUTH EVERY 8 HOURS AS NEEDED FOR SEVERE(SHOULDER PAIN). CAUTION OF SEDATION 30 tablet 0   triamterene -hydrochlorothiazide (MAXZIDE-25) 37.5-25 MG tablet TAKE 2 TABLETS BY MOUTH EVERY DAY (Patient taking differently: Take 0.5 tablets by mouth daily.) 180 tablet 1   verapamil  (CALAN -SR) 240 MG CR tablet TAKE 1 TABLET (240 MG TOTAL) BY MOUTH 2 (TWO) TIMES DAILY. 180 tablet 1   No current facility-administered medications on file prior to visit.    Review of Systems  Constitutional:  Negative for activity change, appetite change, fatigue, fever and unexpected weight change.  HENT:  Negative for congestion, ear pain, rhinorrhea, sinus pressure and sore throat.   Eyes:  Negative for pain, redness and visual disturbance.  Respiratory:  Negative for cough, shortness of breath and wheezing.   Cardiovascular:  Negative for chest pain and palpitations.  Gastrointestinal:  Negative for abdominal pain, blood in stool, constipation and diarrhea.  Endocrine: Negative for polydipsia and polyuria.  Genitourinary:  Negative for dysuria, frequency and urgency.   Musculoskeletal:  Positive for arthralgias. Negative for back pain and myalgias.  Skin:  Negative for pallor and rash.  Allergic/Immunologic: Negative for environmental allergies.  Neurological:  Negative for dizziness, syncope, weakness, numbness and headaches.  Hematological:  Negative for adenopathy. Does not bruise/bleed easily.  Psychiatric/Behavioral:  Negative for decreased concentration and dysphoric mood. The patient is not nervous/anxious.        Objective:   Physical Exam Constitutional:      General: She is not in acute distress.    Appearance: She is obese. She is not ill-appearing or diaphoretic.  Eyes:     Conjunctiva/sclera: Conjunctivae normal.     Pupils: Pupils are equal, round, and reactive to light.  Cardiovascular:     Rate and Rhythm: Normal rate and regular rhythm.  Pulmonary:     Effort: Pulmonary effort is normal. No respiratory distress.     Breath sounds: No wheezing.  Musculoskeletal:     Cervical back: Neck supple.     Right hip: Tenderness and bony tenderness present. No deformity, lacerations or crepitus. Normal range of motion. Normal strength.     Comments: Focal tenderness over right greater trochanter No swelling Full rom of hip   No LS tenderness Mildly tender over right SI area    Lymphadenopathy:     Cervical: No cervical adenopathy.  Neurological:     Mental Status: She is alert.     Sensory: No sensory deficit.     Motor: No weakness.     Comments: Neg SLR for lower leg pain   Psychiatric:        Mood and Affect: Mood normal.           Assessment & Plan:   Problem List Items Addressed This Visit       Musculoskeletal and Integument   Trochanteric bursitis of right hip - Primary  Recurrent  Saw Dr Watt 2 y ago and injection helped Will schedule appointment with him  Reassuring exam, some tenderness of greater trochanter  Encouraged not to use oral nsaid (in past GFR dec)  Trial of voltaren gel up to four times  daily  Also encouraged use of ice/ cold pack for 10 minutes Avoid activities that hurt   Call back and Er precautions noted in detail today

## 2023-09-25 NOTE — Patient Instructions (Addendum)
 Try voltaren gel over the counter -up to four times daily on affected area  Is safer than the aleve   Try a cold pack in area of pain  It will help with inflammation   Stop up front to get an appointment with Dr Watt for hip pain and shoulder pain   For severe pain - tramadol  is ok / but use caution   Let us  know if anything changes before that appointment

## 2023-09-30 NOTE — Progress Notes (Signed)
 Ilina Xu T. Virna Livengood, MD, CAQ Sports Medicine [facility] [address] [place], [address]  Phone: [phone]  FAX: [phone]  Amber Miles - 78 y.o. female  MRN [id number]  Date of Birth: 1945/05/15  Date: 10/03/2023  PCP: Randeen Laine LABOR, MD  Referral: Randeen Laine LABOR, MD  Chief Complaint  Patient presents with   Hip Pain    Right   Subjective:   Amber Miles is a 78 y.o. very pleasant female patient with Body mass index is 32.28 kg/m. who presents with the following:  Discussed the use of AI scribe software for clinical note transcription with the patient, who gave verbal consent to proceed.  She presents with ongoing right sided hip pain and ongoing shoulder pain.  She saw Dr. Randeen on September 25, 2023, and was felt to have trochanteric bursitis of the right hip  She also has had intermittent shoulder pain as well as probable glenohumeral arthritis.  R GTB injection History of Present Illness Amber Miles is a 78 year old female with right side trochanteric bursitis who presents with worsening right hip pain.  She has been experiencing worsening right hip pain, which has been keeping her awake at night. The pain is located in the same area as her previous bursitis and has gradually worsened over the past month. It is primarily on the right side, with some discomfort in the back, but not on the spine.  She maintains an active lifestyle, engaging in over an hour of exercise daily, including exercises for her arms, back, neck, legs, and hips, which she performs at home. T  For pain management, she has been taking Tylenol and Aleve. She prefers to avoid tramadol  due to concerns about addiction. She does not have difficulty getting out of a chair or bed unless the pain is severe.  It has been a couple of years since her last hip injection, which she found helpful in the past.    Review of Systems is noted in the HPI, as  appropriate  Objective:   BP 130/60   Pulse (!) 58   Temp (!) 97.5 F (36.4 C) (Temporal)   Ht 5' 7 (1.702 m)   Wt 206 lb 2 oz (93.5 kg)   SpO2 95%   BMI 32.28 kg/m   GEN: No acute distress; alert,appropriate. PULM: Breathing comfortably in no respiratory distress PSYCH: Normally interactive.    HIP EXAM: SIDE: R ROM: Abduction, Flexion, Internal and External range of motion: Moderate restriction of motion only Pain with terminal IROM and EROM: Minimal GTB: Markedly tender on the right SLR: NEG Knees: No effusion FABER: NT REVERSE FABER: NT, neg Piriformis: NT at direct palpation Str: flexion: 4 /5 abduction: 4 /5 adduction: 5/5 Strength testing non-tender    Physical Exam   Laboratory and Imaging Data:  Assessment and Plan:     ICD-10-CM   1. Trochanteric bursitis of right hip  M70.61     2. Chronic right hip pain  M25.551    G89.29      Assessment & Plan Acute on chronic right trochanteric bursitis with exacerbation Condition worsened over the past month with significant nocturnal discomfort. Pain localized to right side, around SI joint and hip prominence. Prefers Tylenol and Aleve for pain management. - Perform intrabursal injection for right trochanteric bursitis to alleviate acute symptoms and improve sleep quality.  Maintain healthy workouts and strengthening as she is already doing.  She does  also have some pain at the right sided SI joint, as well. I do think occasional tramadol  would be reasonable as well  Aspiration/Injection Procedure Note Amber Miles 30-May-1945 Date of procedure: 10/03/2023  Procedure: Large Joint Aspiration / Injection of Hip, Trochanteric Bursa, R Indications: Pain  Procedure Details Verbal consent obtained. Risks, benefits, and alternatives reviewed. Greater trochanter sterilely prepped with Chloraprep. Ethyl Chloride used for anesthesia. 9 cc of Lidocaine  1% injected with 1 mL of Kenalog  40 mg into trochanteric bursa  at area of maximal tenderness at greater trochanter. Needle taken to bone to troch bursa, flows easily. Bursa massaged. No bleeding and no complications. Decreased pain after injection. Needle: 22 gauge spinal needle Medication: 1 mL of Kenalog  40 mg   Medication Management during today's office visit: No orders of the defined types were placed in this encounter.  There are no discontinued medications.  Orders placed today for conditions managed today: No orders of the defined types were placed in this encounter.   Disposition: No follow-ups on file.  [employer] speech-to-text software was used for transcription in this dictation.  Possible transcriptional errors can occur using Animal nutritionist.   Signed,  Jacques DASEN. Raliyah Montella, MD   Outpatient Encounter Medications as of 10/03/2023  Medication Sig   acyclovir (ZOVIRAX) 400 MG tablet Take 400 mg by mouth 3 (three) times daily. X 3 days for flares   albuterol  (VENTOLIN  HFA) 108 (90 Base) MCG/ACT inhaler INHALE 1 TO 2 PUFFS EVERY 4 TO 6 HOURS AS NEEDED FOR WHEEZE OR COUGH   azelastine (ASTELIN) 0.1 % nasal spray Place 1 spray into both nostrils 2 (two) times daily.   cetirizine (ZYRTEC) 5 MG tablet Take 5 mg by mouth daily as needed.   Docusate Calcium (STOOL SOFTENER PO) Take 1 tablet by mouth daily as needed.   esomeprazole  (NEXIUM ) 40 MG capsule TAKE 1 CAPSULE BY MOUTH EVERY DAY BEFORE BREAKFAST   estradiol  (VIVELLE -DOT) 0.0375 MG/24HR PLACE 1 PATCH ONTO THE SKIN 2 TIMES A WEEK.   furosemide  (LASIX ) 20 MG tablet TAKE 1/2 TABLET BY MOUTH DAILY   GLUCOSAMINE-CHONDROITIN-VIT D3 PO Take 1 capsule by mouth 2 (two) times daily.   levothyroxine (SYNTHROID) 112 MCG tablet Take 112 mcg by mouth daily before breakfast.   losartan  (COZAAR ) 50 MG tablet TAKE 1 TABLET BY MOUTH EVERY DAY   methocarbamol  (ROBAXIN ) 500 MG tablet TAKE 1 TABLET BY MOUTH 3 TIMES DAILY AS NEEDED FOR MUSCLE SPASMS.   montelukast (SINGULAIR) 10 MG tablet Take 10  mg by mouth daily.    Multiple Vitamin (MULTIVITAMIN) tablet Take 1 tablet by mouth daily.     potassium chloride  SA (KLOR-CON  M) 20 MEQ tablet TAKE 1 TABLET BY MOUTH EVERY DAY   QVAR REDIHALER 80 MCG/ACT inhaler Inhale 2 puffs into the lungs 2 (two) times daily.   traMADol  (ULTRAM ) 50 MG tablet TAKE 1-2 TABLETS BY MOUTH EVERY 8 HOURS AS NEEDED FOR SEVERE(SHOULDER PAIN). CAUTION OF SEDATION   triamterene -hydrochlorothiazide (MAXZIDE-25) 37.5-25 MG tablet TAKE 2 TABLETS BY MOUTH EVERY DAY (Patient taking differently: Take 0.5 tablets by mouth daily.)   verapamil  (CALAN -SR) 240 MG CR tablet TAKE 1 TABLET (240 MG TOTAL) BY MOUTH 2 (TWO) TIMES DAILY.   No facility-administered encounter medications on file as of 10/03/2023.

## 2023-10-03 ENCOUNTER — Encounter: Payer: Self-pay | Admitting: Family Medicine

## 2023-10-03 ENCOUNTER — Ambulatory Visit (INDEPENDENT_AMBULATORY_CARE_PROVIDER_SITE_OTHER): Admitting: Family Medicine

## 2023-10-03 VITALS — BP 130/60 | HR 58 | Temp 97.5°F | Ht 67.0 in | Wt 206.1 lb

## 2023-10-03 DIAGNOSIS — M461 Sacroiliitis, not elsewhere classified: Secondary | ICD-10-CM

## 2023-10-03 DIAGNOSIS — M7061 Trochanteric bursitis, right hip: Secondary | ICD-10-CM

## 2023-10-03 DIAGNOSIS — G8929 Other chronic pain: Secondary | ICD-10-CM | POA: Diagnosis not present

## 2023-10-03 DIAGNOSIS — M25551 Pain in right hip: Secondary | ICD-10-CM | POA: Diagnosis not present

## 2023-10-03 MED ORDER — TRIAMCINOLONE ACETONIDE 40 MG/ML IJ SUSP
40.0000 mg | Freq: Once | INTRAMUSCULAR | Status: AC
  Administered 2023-10-03: 40 mg via INTRA_ARTICULAR

## 2023-10-03 NOTE — Addendum Note (Signed)
 Addended by: WENDELL ARLAND RAMAN on: 10/03/2023 03:41 PM   Modules accepted: Orders

## 2023-10-09 ENCOUNTER — Ambulatory Visit: Admitting: Family Medicine

## 2023-10-18 ENCOUNTER — Other Ambulatory Visit: Payer: Self-pay | Admitting: Family Medicine

## 2023-10-18 NOTE — Telephone Encounter (Signed)
 Last filled on 07/24/23 #270 tab/ 0 refill  CPE scheduled 10/23/23

## 2023-10-23 ENCOUNTER — Encounter: Payer: Self-pay | Admitting: Family Medicine

## 2023-10-23 ENCOUNTER — Ambulatory Visit: Payer: PPO | Admitting: Family Medicine

## 2023-10-23 VITALS — BP 146/76 | HR 56 | Temp 97.8°F | Ht 67.0 in | Wt 205.1 lb

## 2023-10-23 DIAGNOSIS — M62838 Other muscle spasm: Secondary | ICD-10-CM

## 2023-10-23 DIAGNOSIS — Z0000 Encounter for general adult medical examination without abnormal findings: Secondary | ICD-10-CM

## 2023-10-23 DIAGNOSIS — R7303 Prediabetes: Secondary | ICD-10-CM

## 2023-10-23 DIAGNOSIS — Z79890 Hormone replacement therapy (postmenopausal): Secondary | ICD-10-CM

## 2023-10-23 DIAGNOSIS — I10 Essential (primary) hypertension: Secondary | ICD-10-CM | POA: Diagnosis not present

## 2023-10-23 DIAGNOSIS — Z79899 Other long term (current) drug therapy: Secondary | ICD-10-CM

## 2023-10-23 DIAGNOSIS — E785 Hyperlipidemia, unspecified: Secondary | ICD-10-CM | POA: Diagnosis not present

## 2023-10-23 DIAGNOSIS — Z1231 Encounter for screening mammogram for malignant neoplasm of breast: Secondary | ICD-10-CM | POA: Insufficient documentation

## 2023-10-23 DIAGNOSIS — E611 Iron deficiency: Secondary | ICD-10-CM

## 2023-10-23 DIAGNOSIS — R944 Abnormal results of kidney function studies: Secondary | ICD-10-CM | POA: Diagnosis not present

## 2023-10-23 DIAGNOSIS — E039 Hypothyroidism, unspecified: Secondary | ICD-10-CM

## 2023-10-23 DIAGNOSIS — Z23 Encounter for immunization: Secondary | ICD-10-CM

## 2023-10-23 MED ORDER — METHOCARBAMOL 500 MG PO TABS: ORAL_TABLET | ORAL | 1 refills | Status: AC

## 2023-10-23 NOTE — Assessment & Plan Note (Signed)
 B12 added to labs

## 2023-10-23 NOTE — Patient Instructions (Addendum)
 Go up on losartan  to 100 mg once daily that is 2 pills of the 50 mg at one time) for blood pressure  Morning is fine Follow up in about a month   Take care of yourself   You have an order for:  [x]   3D Mammogram  []   Bone Density     Please call for appointment:   []   [facility]  [address] [place] [address]  [phone]  []   [facility])   [address]  [place], [place] [address]  [phone]  [x]   [facility]      [address] [place], [place]        [phone]         []   [facility]  [address] [place], [place]  [phone]  []  [facility]   [address]   [place], [place] [address]  [phone]  []  [facility]  [address] [place], [place] [address] [phone]    Make sure to wear two piece clothing  No lotions powders or deodorants the day of the appointment Make sure to bring picture ID and insurance card.  Bring list of medications you are currently taking including any supplements.   Schedule your screening mammogram through MyChart!   Select [facility] imaging sites can now be scheduled through MyChart.  Log into your MyChart account.  Go to 'Visit' (or 'Appointments' if  on mobile App) --> Schedule an  Appointment  Under 'Select a Reason for Visit' choose the Mammogram  Screening option.  Complete the pre-visit questions  and select the time and place that  best fits your schedule

## 2023-10-23 NOTE — Assessment & Plan Note (Signed)
 Disc goals for lipids and reasons to control them Rev last labs with pt Rev low sat fat diet in detail  Labs today Diet controlled

## 2023-10-23 NOTE — Assessment & Plan Note (Signed)
Iron level today.

## 2023-10-23 NOTE — Progress Notes (Signed)
 Subjective:    Patient ID: Amber Miles, female    DOB: 1945-02-21, 78 y.o.   MRN: [id number]  HPI  Here for health maintenance exam and to review chronic medical problems   Wt Readings from Last 3 Encounters:  10/23/23 205 lb 2 oz (93 kg)  10/03/23 206 lb 2 oz (93.5 kg)  09/25/23 204 lb 2 oz (92.6 kg)   32.13 kg/m  Vitals:   10/23/23 1433  BP: (!) 146/76  Pulse: (!) 56  Temp: 97.8 F (36.6 C)  SpO2: 96%    Immunization History  Administered Date(s) Administered   Fluad Quad(high Dose 65+) 10/09/2019, 10/05/2021   INFLUENZA, HIGH DOSE SEASONAL PF 11/11/2015, 10/05/2016, 10/18/2017, 11/11/2018, 10/29/2020, 10/19/2022, 10/23/2023   Influenza Split 10/18/2010   Influenza Whole 11/30/2006, 10/28/2008, 10/27/2009   Influenza-Unspecified 10/07/2012, 08/30/2013, 10/01/2014   PFIZER Comirnaty(Gray Top)Covid-19 Tri-Sucrose Vaccine 06/09/2020   PFIZER(Purple Top)SARS-COV-2 Vaccination 02/04/2019, 02/24/2019, 10/20/2019   PNEUMOCOCCAL CONJUGATE-20 11/13/2022   Pfizer Covid-19 Vaccine Bivalent Booster 67yrs & up 11/02/2020   Pfizer(Comirnaty)Fall Seasonal Vaccine 12 years and older 11/14/2021, 10/19/2022   Pneumococcal Conjugate-13 05/27/2014   Pneumococcal Polysaccharide-23 12/04/2011, 10/07/2015, 10/05/2016, 10/04/2017, 05/15/2019, 05/06/2020, 05/12/2021, 05/15/2023   Respiratory Syncytial Virus Vaccine,Recomb Aduvanted(Arexvy) 11/28/2021   Td 12/04/2011   Tdap 10/02/2022   Zoster Recombinant(Shingrix) 08/06/2019, 10/09/2019   Zoster, Live 10/05/2010    Health Maintenance Due  Topic Date Due   Mammogram  12/07/2023   Flu shot -today   Mammogram 11/2022  Self breast exam-no changes   Gyn health Hysterectomy  On estradiol  transsdermal low dose for pelvic floor issues  No problems    Colon cancer screening  Colonoscopy 04/2023 - clear   Bone health  Dexa  05/2020 normal bmd  Falls- none  Fractures -none  Supplements -vitamin D   Last vitamin D  Lab Results   Component Value Date   VD25OH 74.49 10/06/2022    Exercise  Not walking a lot - uses cane  Doing lots of PT exercises for her upper and lower body   Has chronic back pain and muscle problems   Derm care  Has visit in December   Has spot on left leg 2-3 weeks  Was itchy , now better    Mood    10/23/2023    2:40 PM 08/09/2023    1:54 PM 10/27/2022   10:38 AM 10/13/2022    2:31 PM 10/02/2022   10:48 AM  Depression screen PHQ 2/9  Decreased Interest 0 0 0 0 0  Down, Depressed, Hopeless 0 0 0 0 0  PHQ - 2 Score 0 0 0 0 0  Altered sleeping 0  0 0 0  Tired, decreased energy 0  0 0 0  Change in appetite 0  0 0 0  Feeling bad or failure about yourself  0  0 0 0  Trouble concentrating 0  0 0 0  Moving slowly or fidgety/restless 0  0 0 0  Suicidal thoughts 0  0 0 0  PHQ-9 Score 0  0 0 0  Difficult doing work/chores Not difficult at all  Not difficult at all Not difficult at all Not difficult at all    HTN bp is stable today  No cp or palpitations or headaches or edema  No side effects to medicines  BP Readings from Last 3 Encounters:  10/23/23 (!) 146/76  10/03/23 130/60  09/25/23 133/65    Triam hct 37.5-25 mg 1/2 pill daily (watching renal numbers)  Verapamil  xr 240  mg daily  Lasix  10 mg 1/2 pill daily (helps swelling)  Lab Results  Component Value Date   NA 135 02/26/2023   K 4.0 02/26/2023   CO2 29 02/26/2023   GLUCOSE 92 02/26/2023   BUN 14 02/26/2023   CREATININE 0.87 02/26/2023   CALCIUM 9.8 02/26/2023   GFR 63.94 02/26/2023   GFRNONAA 55 (L) 08/26/2012    Hypothyroidism  Pt has no clinical changes No change in energy level/ hair or skin/ edema and no tremor Lab Results  Component Value Date   TSH 8.16 (H) 02/06/2023    Levothyroxine 112 mcg daily  Sees endocrinology History of thyroid  nodules   Takes nexium  Lab Results  Component Value Date   VITAMINB12 888 10/06/2022    Prediabetes Lab Results  Component Value Date   HGBA1C 5.7  10/06/2022   HGBA1C 6.0 10/03/2021   HGBA1C 6.0 08/04/2020   Hyperlipidemia Lab Results  Component Value Date   CHOL 158 10/06/2022   HDL 49.00 10/06/2022   LDLCALC 86 10/06/2022   LDLDIRECT 114.0 08/04/2020   TRIG 119.0 10/06/2022   CHOLHDL 3 10/06/2022     Patient Active Problem List   Diagnosis Date Noted   Encounter for screening mammogram for breast cancer 10/23/2023   Trochanteric bursitis of right hip 09/25/2023   Open wound of left hand 04/10/2023   CAP (community acquired pneumonia) 04/10/2023   Muscle spasm 04/10/2023   Skin ulcer of hand 03/15/2023   Gastroenteritis 03/14/2023   Low iron 03/04/2023   History of UTI 10/27/2022   Elevated platelet count 10/15/2022   Hormone replacement therapy (HRT) 10/13/2022   Multiple thyroid  nodules 10/13/2022   Current use of proton pump inhibitor 10/05/2022   Decreased GFR 06/19/2022   Routine general medical examination at a health care facility 07/29/2019   Genetic testing 01/02/2018   Family history of breast cancer    Family history of genetic disease carrier    Meningioma (HCC) 07/17/2017   Fall 01/04/2017   Atrophic vaginitis 09/01/2015   Pedal edema 05/27/2014   Estrogen deficiency 02/21/2013   Encounter for Medicare annual wellness exam 02/21/2013   Low back pain potentially associated with radiculopathy 10/11/2012   Allergic rhinitis 01/28/2010   Prediabetes 07/30/2009   Anxiety state 11/30/2008   Hyperlipidemia 04/13/2008   Hypothyroidism 01/07/2008   HYPERTENSION, BENIGN 01/07/2008   OSTEOARTHRITIS, GENERALIZED 01/07/2008   Past Medical History:  Diagnosis Date   Allergy    allergic rhinitis   Anxiety    Arthritis    OA knee replacement   Cataract    Degenerative disc disease, thoracic    Family history of breast cancer    Family history of genetic disease carrier    paternal cousin has NBN mutation   GERD (gastroesophageal reflux disease)    Hyperlipidemia    Hypertension    Hypothyroid     Reactive airways dysfunction syndrome (HCC)    Skin lesions, generalized    squamous cell skin lesions and basal cell skin lesions   Past Surgical History:  Procedure Laterality Date   ABDOMINAL HYSTERECTOMY  1990s   hyst with oophrectomy for B9lesion/cyst and prolapse   BREAST BIOPSY Bilateral    benign   JOINT REPLACEMENT  05/2009   left total knee replacement   recocele     and cystocele repair   TONSILLECTOMY     tsa     Social History   Tobacco Use   Smoking status: Never   Smokeless tobacco: Never  Vaping Use   Vaping status: Never Used  Substance Use Topics   Alcohol use: No    Alcohol/week: 0.0 standard drinks of alcohol   Drug use: No   Family History  Problem Relation Age of Onset   Hypertension Mother    Stroke Mother    Depression Mother    Alzheimer's disease Mother    Heart disease Father    Cerebral palsy Father    Hypertension Sister    Breast cancer Cousin 72       NBN mutation   Melanoma Maternal Uncle        dx >50   Allergies  Allergen Reactions   Tape Swelling    Itching and redness Pt said severe reaction to surgical tape.? latex   Neosporin [Neomycin -Polymyxin-Gramicidin] Other (See Comments)    blisters   Current Outpatient Medications on File Prior to Visit  Medication Sig Dispense Refill   acyclovir (ZOVIRAX) 400 MG tablet Take 400 mg by mouth 3 (three) times daily. X 3 days for flares     albuterol  (VENTOLIN  HFA) 108 (90 Base) MCG/ACT inhaler INHALE 1 TO 2 PUFFS EVERY 4 TO 6 HOURS AS NEEDED FOR WHEEZE OR COUGH 8.5 each 1   azelastine (ASTELIN) 0.1 % nasal spray Place 1 spray into both nostrils 2 (two) times daily.     cetirizine (ZYRTEC) 5 MG tablet Take 5 mg by mouth daily as needed.     Docusate Calcium (STOOL SOFTENER PO) Take 1 tablet by mouth daily as needed.     esomeprazole  (NEXIUM ) 40 MG capsule TAKE 1 CAPSULE BY MOUTH EVERY DAY BEFORE BREAKFAST 90 capsule 1   estradiol  (VIVELLE -DOT) 0.0375 MG/24HR PLACE 1 PATCH ONTO THE  SKIN 2 TIMES A WEEK. 24 patch 1   furosemide  (LASIX ) 20 MG tablet TAKE 1/2 TABLET BY MOUTH DAILY 45 tablet 1   GLUCOSAMINE-CHONDROITIN-VIT D3 PO Take 1 capsule by mouth 2 (two) times daily.     levothyroxine (SYNTHROID) 112 MCG tablet Take 112 mcg by mouth daily before breakfast.     losartan  (COZAAR ) 50 MG tablet TAKE 1 TABLET BY MOUTH EVERY DAY 90 tablet 1   montelukast (SINGULAIR) 10 MG tablet Take 10 mg by mouth daily.      Multiple Vitamin (MULTIVITAMIN) tablet Take 1 tablet by mouth daily.       potassium chloride  SA (KLOR-CON  M) 20 MEQ tablet TAKE 1 TABLET BY MOUTH EVERY DAY 90 tablet 0   QVAR REDIHALER 80 MCG/ACT inhaler Inhale 2 puffs into the lungs 2 (two) times daily.     traMADol  (ULTRAM ) 50 MG tablet TAKE 1-2 TABLETS BY MOUTH EVERY 8 HOURS AS NEEDED FOR SEVERE(SHOULDER PAIN). CAUTION OF SEDATION 30 tablet 0   triamterene -hydrochlorothiazide (MAXZIDE-25) 37.5-25 MG tablet TAKE 2 TABLETS BY MOUTH EVERY DAY (Patient taking differently: Take 0.5 tablets by mouth daily.) 180 tablet 1   verapamil  (CALAN -SR) 240 MG CR tablet TAKE 1 TABLET (240 MG TOTAL) BY MOUTH 2 (TWO) TIMES DAILY. 180 tablet 1   No current facility-administered medications on file prior to visit.    Review of Systems  Constitutional:  Negative for activity change, appetite change, fatigue, fever and unexpected weight change.  HENT:  Negative for congestion, ear pain, rhinorrhea, sinus pressure and sore throat.   Eyes:  Negative for pain, redness and visual disturbance.  Respiratory:  Negative for cough, shortness of breath and wheezing.   Cardiovascular:  Negative for chest pain and palpitations.  Gastrointestinal:  Negative for abdominal pain, blood  in stool, constipation and diarrhea.  Endocrine: Negative for polydipsia and polyuria.  Genitourinary:  Negative for dysuria, frequency and urgency.  Musculoskeletal:  Positive for arthralgias, back pain and myalgias.  Skin:  Negative for pallor and rash.   Allergic/Immunologic: Negative for environmental allergies.  Neurological:  Negative for dizziness, syncope and headaches.  Hematological:  Negative for adenopathy. Does not bruise/bleed easily.  Psychiatric/Behavioral:  Negative for decreased concentration and dysphoric mood. The patient is not nervous/anxious.        Objective:   Physical Exam Constitutional:      General: She is not in acute distress.    Appearance: Normal appearance. She is well-developed. She is obese. She is not ill-appearing or diaphoretic.  HENT:     Head: Normocephalic and atraumatic.     Right Ear: Tympanic membrane, ear canal and external ear normal.     Left Ear: Tympanic membrane, ear canal and external ear normal.     Nose: Nose normal. No congestion.     Mouth/Throat:     Mouth: Mucous membranes are moist.     Pharynx: Oropharynx is clear. No posterior oropharyngeal erythema.  Eyes:     General: No scleral icterus.    Extraocular Movements: Extraocular movements intact.     Conjunctiva/sclera: Conjunctivae normal.     Pupils: Pupils are equal, round, and reactive to light.  Neck:     Thyroid : No thyromegaly.     Vascular: No carotid bruit or JVD.  Cardiovascular:     Rate and Rhythm: Normal rate and regular rhythm.     Pulses: Normal pulses.     Heart sounds: Normal heart sounds.     No gallop.  Pulmonary:     Effort: Pulmonary effort is normal. No respiratory distress.     Breath sounds: Normal breath sounds. No wheezing.     Comments: Good air exch Chest:     Chest wall: No tenderness.  Abdominal:     General: Bowel sounds are normal. There is no distension or abdominal bruit.     Palpations: Abdomen is soft. There is no mass.     Tenderness: There is no abdominal tenderness.     Hernia: No hernia is present.  Genitourinary:    Comments: Breast exam: No mass, nodules, thickening, tenderness, bulging, retraction, inflamation, nipple discharge or skin changes noted.  No axillary or  clavicular LA.     Musculoskeletal:        General: No tenderness. Normal range of motion.     Cervical back: Normal range of motion and neck supple. No rigidity. No muscular tenderness.     Right lower leg: No edema.     Left lower leg: No edema.     Comments: No kyphosis   Lymphadenopathy:     Cervical: No cervical adenopathy.  Skin:    General: Skin is warm and dry.     Coloration: Skin is not pale.     Findings: No erythema or rash.     Comments: Scab on right lower leg- pink in color (old insect bite?)  Solar lentigines diffusely Scattered sks   Neurological:     Mental Status: She is alert. Mental status is at baseline.     Cranial Nerves: No cranial nerve deficit.     Motor: No abnormal muscle tone.     Coordination: Coordination normal.     Gait: Gait normal.     Deep Tendon Reflexes: Reflexes are normal and symmetric. Reflexes normal.  Psychiatric:  Mood and Affect: Mood normal.        Cognition and Memory: Cognition and memory normal.           Assessment & Plan:   Problem List Items Addressed This Visit       Cardiovascular and Mediastinum   HYPERTENSION, BENIGN   Blood pressure is not at goal  BP Readings from Last 1 Encounters:  10/23/23 (!) 146/76   Will increase losartan  from 50 to 100 -watching gfr  Follow up 1 mo  Continue Triam hct 37.5-25 half pill daily  Verapamil  xr 240 mg daily  Lasix  5 mg prn edema    Most recent labs reviewed  Disc lifstyle change with low sodium diet and exercise        Relevant Orders   TSH   Lipid panel   Comprehensive metabolic panel with GFR   CBC with Differential/Platelet     Endocrine   Hypothyroidism   TSH today Continues 112 mcg levothyroxine  Under endocrinology care       Relevant Orders   TSH     Other   Routine general medical examination at a health care facility - Primary   Reviewed health habits including diet and exercise and skin cancer prevention Reviewed appropriate  screening tests for age  Also reviewed health mt list, fam hx and immunization status , as well as social and family history   See HPI Labs reviewed and ordered Health Maintenance  Topic Date Due   Breast Cancer Screening  12/07/2023   COVID-19 Vaccine (8 - Pfizer risk 2024-25 season) 10/10/2025*   Medicare Annual Wellness Visit  08/08/2024   DTaP/Tdap/Td vaccine (3 - Td or Tdap) 10/01/2032   Pneumococcal Vaccine for age over 59  Completed   Flu Shot  Completed   DEXA scan (bone density measurement)  Completed   Hepatitis C Screening  Completed   Zoster (Shingles) Vaccine  Completed   Meningitis B Vaccine  Aged Out   Colon Cancer Screening  Discontinued  *Topic was postponed. The date shown is not the original due date.    Flu shot given Lab today  Mammo ordered  Discussed fall prevention, supplements and exercise for bone density   Utd derm care  PHQ 0        Prediabetes   A1c today  disc imp of low glycemic diet and wt loss to prevent DM2        Relevant Orders   Hemoglobin A1c   Muscle spasm   Ongoing Methocarbamol  helps and tolerated well       Low iron   Iron level today      Hyperlipidemia   Disc goals for lipids and reasons to control them Rev last labs with pt Rev low sat fat diet in detail  Labs today  Diet controlled       Relevant Orders   Lipid panel   Comprehensive metabolic panel with GFR   Hormone replacement therapy (HRT)   Continues low dose transdermal estrogen for pelvic floor health Reviewed pros/cons/risks Pt wants to continue this       Encounter for screening mammogram for breast cancer   Mammogram ordered Pt will schedule this       Relevant Orders   MM 3D SCREENING MAMMOGRAM BILATERAL BREAST   Decreased GFR   GFR today        Relevant Orders   Comprehensive metabolic panel with GFR   Current use of proton pump inhibitor   B12  added to labs       Relevant Orders   Vitamin B12   Other Visit Diagnoses        Iron deficiency       Relevant Orders   CBC with Differential/Platelet   Iron     Need for influenza vaccination       Relevant Orders   Flu vaccine HIGH DOSE PF(Fluzone  Trivalent) (Completed)

## 2023-10-23 NOTE — Assessment & Plan Note (Signed)
 A1c today  disc imp of low glycemic diet and wt loss to prevent DM2

## 2023-10-23 NOTE — Assessment & Plan Note (Signed)
 Blood pressure is not at goal  BP Readings from Last 1 Encounters:  10/23/23 (!) 146/76   Will increase losartan  from 50 to 100 -watching gfr  Follow up 1 mo  Continue Triam hct 37.5-25 half pill daily  Verapamil  xr 240 mg daily  Lasix  5 mg prn edema    Most recent labs reviewed  Disc lifstyle change with low sodium diet and exercise

## 2023-10-23 NOTE — Assessment & Plan Note (Signed)
 GFR today

## 2023-10-23 NOTE — Assessment & Plan Note (Signed)
 Reviewed health habits including diet and exercise and skin cancer prevention Reviewed appropriate screening tests for age  Also reviewed health mt list, fam hx and immunization status , as well as social and family history   See HPI Labs reviewed and ordered Health Maintenance  Topic Date Due   Breast Cancer Screening  12/07/2023   COVID-19 Vaccine (8 - Pfizer risk 2024-25 season) 10/10/2025*   Medicare Annual Wellness Visit  08/08/2024   DTaP/Tdap/Td vaccine (3 - Td or Tdap) 10/01/2032   Pneumococcal Vaccine for age over 47  Completed   Flu Shot  Completed   DEXA scan (bone density measurement)  Completed   Hepatitis C Screening  Completed   Zoster (Shingles) Vaccine  Completed   Meningitis B Vaccine  Aged Out   Colon Cancer Screening  Discontinued  *Topic was postponed. The date shown is not the original due date.    Flu shot given Lab today  Mammo ordered  Discussed fall prevention, supplements and exercise for bone density   Utd derm care  PHQ 0

## 2023-10-23 NOTE — Assessment & Plan Note (Signed)
 Ongoing Methocarbamol  helps and tolerated well

## 2023-10-23 NOTE — Assessment & Plan Note (Signed)
 Continues low dose transdermal estrogen for pelvic floor health Reviewed pros/cons/risks Pt wants to continue this

## 2023-10-23 NOTE — Assessment & Plan Note (Signed)
 Mammogram ordered Pt will schedule this

## 2023-10-23 NOTE — Assessment & Plan Note (Signed)
 TSH today Continues 112 mcg levothyroxine  Under endocrinology care

## 2023-10-24 ENCOUNTER — Ambulatory Visit: Payer: Self-pay | Admitting: Family Medicine

## 2023-10-24 LAB — COMPREHENSIVE METABOLIC PANEL WITH GFR
ALT: 15 U/L (ref 0–35)
AST: 13 U/L (ref 0–37)
Albumin: 4.1 g/dL (ref 3.5–5.2)
Alkaline Phosphatase: 61 U/L (ref 39–117)
BUN: 27 mg/dL — ABNORMAL HIGH (ref 6–23)
CO2: 28 meq/L (ref 19–32)
Calcium: 9.8 mg/dL (ref 8.4–10.5)
Chloride: 101 meq/L (ref 96–112)
Creatinine, Ser: 0.97 mg/dL (ref 0.40–1.20)
GFR: 55.85 mL/min — ABNORMAL LOW (ref >60.00)
Glucose, Bld: 91 mg/dL (ref 70–99)
Potassium: 4 meq/L (ref 3.5–5.1)
Sodium: 136 meq/L (ref 135–145)
Total Bilirubin: 0.5 mg/dL (ref 0.2–1.2)
Total Protein: 7.1 g/dL (ref 6.0–8.3)

## 2023-10-24 LAB — CBC WITH DIFFERENTIAL/PLATELET
Basophils Absolute: 0.1 K/uL (ref 0.0–0.1)
Basophils Relative: 1.4 % (ref 0.0–3.0)
Eosinophils Absolute: 0.1 K/uL (ref 0.0–0.7)
Eosinophils Relative: 1.5 % (ref 0.0–5.0)
HCT: 44.4 % (ref 36.0–46.0)
Hemoglobin: 14.5 g/dL (ref 12.0–15.0)
Lymphocytes Relative: 22.9 % (ref 12.0–46.0)
Lymphs Abs: 1.4 K/uL (ref 0.7–4.0)
MCHC: 32.5 g/dL (ref 30.0–36.0)
MCV: 90.2 fl (ref 78.0–100.0)
Monocytes Absolute: 0.5 K/uL (ref 0.1–1.0)
Monocytes Relative: 7.5 % (ref 3.0–12.0)
Neutro Abs: 4.2 K/uL (ref 1.4–7.7)
Neutrophils Relative %: 66.7 % (ref 43.0–77.0)
Platelets: 321 K/uL (ref 150.0–400.0)
RBC: 4.92 Mil/uL (ref 3.87–5.11)
RDW: 15.5 % (ref 11.5–15.5)
WBC: 6.3 K/uL (ref 4.0–10.5)

## 2023-10-24 LAB — LIPID PANEL
Cholesterol: 172 mg/dL (ref 0–200)
HDL: 57.3 mg/dL (ref >39.00)
LDL Cholesterol: 98 mg/dL (ref 0–99)
NonHDL: 114.92
Total CHOL/HDL Ratio: 3
Triglycerides: 87 mg/dL (ref 0.0–149.0)
VLDL: 17.4 mg/dL (ref 0.0–40.0)

## 2023-10-24 LAB — IRON: Iron: 113 ug/dL (ref 42–145)

## 2023-10-24 LAB — HEMOGLOBIN A1C: Hgb A1c MFr Bld: 5.8 % (ref 4.6–6.5)

## 2023-10-24 LAB — TSH: TSH: 1.16 u[IU]/mL (ref 0.35–5.50)

## 2023-10-24 LAB — VITAMIN B12: Vitamin B-12: 622 pg/mL (ref 211–911)

## 2023-10-24 NOTE — Telephone Encounter (Unsigned)
 Copied from CRM #[id number]. Topic: Clinical - Medication Question >> Oct 24, 2023 10:34 AM Rea BROCKS wrote: Reason for CRM: Patient called in and stated that Dr. Randeen is supposed to call in a prescription to replace losartan  and she would like for Dr. Randeen to place the refill in. Patient stated pharmacy has the prescription for the robaxin  but not the other one to replace the losartan  for her blood pressure.   [id number] [place], [place] - [address] [place] [address] Phone: [phone] Fax: [phone] Hours: Not open 24 hours   [phone] (H)

## 2023-11-01 DIAGNOSIS — H40053 Ocular hypertension, bilateral: Secondary | ICD-10-CM | POA: Diagnosis not present

## 2023-11-01 DIAGNOSIS — H2513 Age-related nuclear cataract, bilateral: Secondary | ICD-10-CM | POA: Diagnosis not present

## 2023-11-02 DIAGNOSIS — E890 Postprocedural hypothyroidism: Secondary | ICD-10-CM | POA: Diagnosis not present

## 2023-11-03 ENCOUNTER — Other Ambulatory Visit: Payer: Self-pay | Admitting: Family Medicine

## 2023-11-04 ENCOUNTER — Other Ambulatory Visit: Payer: Self-pay | Admitting: Family Medicine

## 2023-11-05 NOTE — Telephone Encounter (Signed)
 Last filled on 09/22/22 #24 patches with 1 refill  F/u scheduled on 11/27/23

## 2023-11-07 ENCOUNTER — Telehealth: Payer: Self-pay | Admitting: *Deleted

## 2023-11-07 ENCOUNTER — Other Ambulatory Visit: Payer: Self-pay | Admitting: Family Medicine

## 2023-11-07 MED ORDER — LOSARTAN POTASSIUM 100 MG PO TABS: 100.0000 mg | ORAL_TABLET | Freq: Every day | ORAL | 1 refills | Status: AC

## 2023-11-07 NOTE — Telephone Encounter (Signed)
 Copied from CRM #[id number]. Topic: Clinical - Medication Question >> Nov 07, 2023  2:37 PM Franky GRADE wrote: Reason for CRM: Patient is calling to advise Dr.Tower that she is out of the losartan  (COZAAR ) 50 MG tablet [[id number]], she was seen on 10/23/2023 and Dr.Tower informed patient there would be a change to the dosage but dose not remember the amount.

## 2023-11-07 NOTE — Telephone Encounter (Signed)
 I sent it  We are doing up from 50 to 100 mg daily for losartan 

## 2023-11-08 DIAGNOSIS — E890 Postprocedural hypothyroidism: Secondary | ICD-10-CM | POA: Diagnosis not present

## 2023-11-17 ENCOUNTER — Other Ambulatory Visit: Payer: Self-pay | Admitting: Family Medicine

## 2023-11-20 ENCOUNTER — Other Ambulatory Visit: Payer: Self-pay | Admitting: Family Medicine

## 2023-11-27 ENCOUNTER — Encounter: Payer: Self-pay | Admitting: Family Medicine

## 2023-11-27 ENCOUNTER — Ambulatory Visit: Admitting: Family Medicine

## 2023-11-27 VITALS — BP 131/62 | HR 55 | Temp 97.7°F | Ht 67.0 in | Wt 210.2 lb

## 2023-11-27 DIAGNOSIS — R7303 Prediabetes: Secondary | ICD-10-CM

## 2023-11-27 DIAGNOSIS — I10 Essential (primary) hypertension: Secondary | ICD-10-CM

## 2023-11-27 MED ORDER — TRIAMTERENE-HCTZ 37.5-25 MG PO TABS: 0.5000 | ORAL_TABLET | Freq: Every day | ORAL | 3 refills | Status: AC

## 2023-11-27 NOTE — Assessment & Plan Note (Signed)
 bp in fair control at this time  BP Readings from Last 1 Encounters:  11/27/23 131/62   No changes needed Most recent labs reviewed  Disc lifstyle change with low sodium diet and exercise  Improved with increase of losartan  to 100 mg  Continue Maxzide 37.5-25 mg 1/2 pill daily  Lasix  prn (edema is controlled)   Follow up 6 mo

## 2023-11-27 NOTE — Assessment & Plan Note (Signed)
 Prediabetes  Lab Results  Component Value Date   HGBA1C 5.8 10/23/2023   HGBA1C 5.7 10/06/2022   HGBA1C 6.0 10/03/2021    disc imp of low glycemic diet and wt loss to prevent DM2  Plans to cut back on candy/sweets

## 2023-11-27 NOTE — Progress Notes (Signed)
 Subjective:    Patient ID: Amber Miles, female    DOB: 12-07-1945, 78 y.o.   MRN: [id number]  HPI  Wt Readings from Last 3 Encounters:  11/27/23 210 lb 4 oz (95.4 kg)  10/23/23 205 lb 2 oz (93 kg)  10/03/23 206 lb 2 oz (93.5 kg)   32.93 kg/m  Vitals:   11/27/23 1428 11/27/23 1450  BP: 136/72 131/62  Pulse: (!) 55   Temp: 97.7 F (36.5 C)   SpO2: 97%     Pt presents for follow up of  HTN   Painted her bedroom- a little cough/ voice is hoarse  Otherwise feels ok   HTN bp is improved today  No cp or palpitations or headaches or edema  No side effects to medicines  BP Readings from Last 3 Encounters:  11/27/23 131/62  10/23/23 (!) 146/76  10/03/23 130/60     Lab Results  Component Value Date   NA 136 10/23/2023   K 4.0 10/23/2023   CO2 28 10/23/2023   GLUCOSE 91 10/23/2023   BUN 27 (H) 10/23/2023   CREATININE 0.97 10/23/2023   CALCIUM 9.8 10/23/2023   GFR 55.85 (L) 10/23/2023   GFRNONAA 55 (L) 08/26/2012    Last visit increased losartan  from 50 to 100 mg daily  Taking Triamterine-hct 37.5-25 mg half pill daily  Verapamil  xr 240 mg daily  Lasix  5 mg prn edema     Prediabetes Lab Results  Component Value Date   HGBA1C 5.8 10/23/2023   HGBA1C 5.7 10/06/2022   HGBA1C 6.0 10/03/2021   Ate too much halloween candy    Patient Active Problem List   Diagnosis Date Noted   Encounter for screening mammogram for breast cancer 10/23/2023   Trochanteric bursitis of right hip 09/25/2023   Open wound of left hand 04/10/2023   CAP (community acquired pneumonia) 04/10/2023   Muscle spasm 04/10/2023   Skin ulcer of hand 03/15/2023   Gastroenteritis 03/14/2023   Low iron 03/04/2023   History of UTI 10/27/2022   Elevated platelet count 10/15/2022   Hormone replacement therapy (HRT) 10/13/2022   Multiple thyroid  nodules 10/13/2022   Current use of proton pump inhibitor 10/05/2022   Decreased GFR 06/19/2022   Routine general medical examination at a  health care facility 07/29/2019   Genetic testing 01/02/2018   Family history of breast cancer    Family history of genetic disease carrier    Meningioma (HCC) 07/17/2017   Fall 01/04/2017   Atrophic vaginitis 09/01/2015   Pedal edema 05/27/2014   Estrogen deficiency 02/21/2013   Encounter for Medicare annual wellness exam 02/21/2013   Low back pain potentially associated with radiculopathy 10/11/2012   Allergic rhinitis 01/28/2010   Prediabetes 07/30/2009   Anxiety state 11/30/2008   Hyperlipidemia 04/13/2008   Hypothyroidism 01/07/2008   HYPERTENSION, BENIGN 01/07/2008   OSTEOARTHRITIS, GENERALIZED 01/07/2008   Past Medical History:  Diagnosis Date   Allergy    allergic rhinitis   Anxiety    Arthritis    OA knee replacement   Cataract    Degenerative disc disease, thoracic    Family history of breast cancer    Family history of genetic disease carrier    paternal cousin has NBN mutation   GERD (gastroesophageal reflux disease)    Hyperlipidemia    Hypertension    Hypothyroid    Reactive airways dysfunction syndrome (HCC)    Skin lesions, generalized    squamous cell skin lesions and basal cell skin lesions  Past Surgical History:  Procedure Laterality Date   ABDOMINAL HYSTERECTOMY  1990s   hyst with oophrectomy for B9lesion/cyst and prolapse   BREAST BIOPSY Bilateral    benign   JOINT REPLACEMENT  05/2009   left total knee replacement   recocele     and cystocele repair   TONSILLECTOMY     tsa     Social History   Tobacco Use   Smoking status: Never   Smokeless tobacco: Never  Vaping Use   Vaping status: Never Used  Substance Use Topics   Alcohol use: No    Alcohol/week: 0.0 standard drinks of alcohol   Drug use: No   Family History  Problem Relation Age of Onset   Hypertension Mother    Stroke Mother    Depression Mother    Alzheimer's disease Mother    Heart disease Father    Cerebral palsy Father    Hypertension Sister    Breast cancer  Cousin 59       NBN mutation   Melanoma Maternal Uncle        dx >50   Allergies  Allergen Reactions   Tape Swelling    Itching and redness Pt said severe reaction to surgical tape.? latex   Neosporin [Neomycin -Polymyxin-Gramicidin] Other (See Comments)    blisters   Current Outpatient Medications on File Prior to Visit  Medication Sig Dispense Refill   acyclovir (ZOVIRAX) 400 MG tablet Take 400 mg by mouth 3 (three) times daily. X 3 days for flares     albuterol  (VENTOLIN  HFA) 108 (90 Base) MCG/ACT inhaler INHALE 1 TO 2 PUFFS EVERY 4 TO 6 HOURS AS NEEDED FOR WHEEZE OR COUGH 8.5 each 1   azelastine (ASTELIN) 0.1 % nasal spray Place 1 spray into both nostrils 2 (two) times daily.     cetirizine (ZYRTEC) 5 MG tablet Take 5 mg by mouth daily as needed.     Docusate Calcium (STOOL SOFTENER PO) Take 1 tablet by mouth daily as needed.     esomeprazole  (NEXIUM ) 40 MG capsule TAKE 1 CAPSULE BY MOUTH EVERY DAY BEFORE BREAKFAST 90 capsule 1   estradiol  (VIVELLE -DOT) 0.0375 MG/24HR PLACE 1 PATCH ONTO THE SKIN 2 TIMES A WEEK. 24 patch 3   furosemide  (LASIX ) 20 MG tablet TAKE 1/2 TABLET BY MOUTH DAILY 45 tablet 1   GLUCOSAMINE-CHONDROITIN-VIT D3 PO Take 1 capsule by mouth 2 (two) times daily.     levothyroxine (SYNTHROID) 112 MCG tablet Take 112 mcg by mouth daily before breakfast.     losartan  (COZAAR ) 100 MG tablet Take 1 tablet (100 mg total) by mouth daily. 90 tablet 1   methocarbamol  (ROBAXIN ) 500 MG tablet TAKE 1 TABLET BY MOUTH THREE TIMES A DAY AS NEEDED FOR MUSCLE SPASM 270 tablet 1   montelukast (SINGULAIR) 10 MG tablet Take 10 mg by mouth daily.      Multiple Vitamin (MULTIVITAMIN) tablet Take 1 tablet by mouth daily.       potassium chloride  SA (KLOR-CON  M) 20 MEQ tablet TAKE 1 TABLET BY MOUTH EVERY DAY 90 tablet 0   QVAR REDIHALER 80 MCG/ACT inhaler Inhale 2 puffs into the lungs 2 (two) times daily.     traMADol  (ULTRAM ) 50 MG tablet 1-2 TABS BY MOUTH EVERY 8 HOURS AS NEEDED FOR  SEVERE(SHOULDER PAIN). CAUTION OF SEDATION (M25.519) 30 tablet 0   verapamil  (CALAN -SR) 240 MG CR tablet TAKE 1 TABLET (240 MG TOTAL) BY MOUTH 2 (TWO) TIMES DAILY. 180 tablet 1   No  current facility-administered medications on file prior to visit.    Review of Systems     Objective:   Physical Exam Constitutional:      General: She is not in acute distress.    Appearance: Normal appearance. She is well-developed. She is obese. She is not ill-appearing or diaphoretic.  HENT:     Head: Normocephalic and atraumatic.  Eyes:     Conjunctiva/sclera: Conjunctivae normal.     Pupils: Pupils are equal, round, and reactive to light.  Neck:     Thyroid : No thyromegaly.     Vascular: No carotid bruit or JVD.  Cardiovascular:     Rate and Rhythm: Normal rate and regular rhythm.     Heart sounds: Normal heart sounds.     No gallop.  Pulmonary:     Effort: Pulmonary effort is normal. No respiratory distress.     Breath sounds: Normal breath sounds. No stridor. No wheezing, rhonchi or rales.     Comments: Upper airway sounds cleared by cough Abdominal:     General: There is no distension or abdominal bruit.     Palpations: Abdomen is soft.  Musculoskeletal:     Cervical back: Normal range of motion and neck supple.     Right lower leg: No edema.     Left lower leg: No edema.  Lymphadenopathy:     Cervical: No cervical adenopathy.  Skin:    General: Skin is warm and dry.     Coloration: Skin is not pale.     Findings: No rash.  Neurological:     Mental Status: She is alert.     Coordination: Coordination normal.     Deep Tendon Reflexes: Reflexes are normal and symmetric. Reflexes normal.  Psychiatric:        Mood and Affect: Mood normal.           Assessment & Plan:   Problem List Items Addressed This Visit       Cardiovascular and Mediastinum   HYPERTENSION, BENIGN - Primary   bp in fair control at this time  BP Readings from Last 1 Encounters:  11/27/23 131/62    No changes needed Most recent labs reviewed  Disc lifstyle change with low sodium diet and exercise  Improved with increase of losartan  to 100 mg  Continue Maxzide 37.5-25 mg 1/2 pill daily  Lasix  prn (edema is controlled)   Follow up 6 mo       Relevant Medications   triamterene -hydrochlorothiazide (MAXZIDE-25) 37.5-25 MG tablet     Other   Prediabetes   Prediabetes  Lab Results  Component Value Date   HGBA1C 5.8 10/23/2023   HGBA1C 5.7 10/06/2022   HGBA1C 6.0 10/03/2021    disc imp of low glycemic diet and wt loss to prevent DM2  Plans to cut back on candy/sweets

## 2023-11-27 NOTE — Patient Instructions (Addendum)
 Continue the losartan  100 mg once daily  Triamterene   hct 1/2 pill daily  Blood pressure is improved   Take care of yourself   Keep up a good fluid intake    Avoid added sugars in your diet when you can  Try to get most of your carbohydrates from produce (with the exception of white potatoes) and whole grains Eat less bread/pasta/rice/snack foods/cereals/sweets and other items from the middle of the grocery store (processed carbs)  Limit sweets as much as you can

## 2023-12-01 ENCOUNTER — Other Ambulatory Visit: Payer: Self-pay | Admitting: Family Medicine

## 2023-12-10 ENCOUNTER — Ambulatory Visit
Admission: RE | Admit: 2023-12-10 | Discharge: 2023-12-10 | Disposition: A | Discharge status: Home / Self Care | Source: Ambulatory Visit | Attending: Family Medicine | Admitting: Family Medicine

## 2023-12-10 DIAGNOSIS — Z1231 Encounter for screening mammogram for malignant neoplasm of breast: Secondary | ICD-10-CM | POA: Diagnosis not present

## 2023-12-17 ENCOUNTER — Telehealth: Payer: Self-pay | Admitting: Family Medicine

## 2023-12-17 NOTE — Telephone Encounter (Unsigned)
 Copied from CRM [id number]. Topic: Clinical - Medication Refill >> Dec 17, 2023 10:07 AM Ahlexyia S wrote: Medication: traMADol  (ULTRAM ) 50 MG tablet  Has the patient contacted their pharmacy? Yes, pt was advised to contact clinic. (Agent: If no, request that the patient contact the pharmacy for the refill. If patient does not wish to contact the pharmacy document the reason why and proceed with request.) (Agent: If yes, when and what did the pharmacy advise?)  This is the patient's preferred pharmacy:  [id number] [place] - [address] [place] [address] Phone: [phone] Fax: [phone]  Is this the correct pharmacy for this prescription? Yes If no, delete pharmacy and type the correct one.   Has the prescription been filled recently? No  Is the patient out of the medication? Yes  Has the patient been seen for an appointment in the last year OR does the patient have an upcoming appointment? Yes  Can we respond through MyChart? No, prefers phone calls.   Agent: Please be advised that Rx refills may take up to 3 business days. We ask that you follow-up with your pharmacy.

## 2023-12-18 MED ORDER — TRAMADOL HCL 50 MG PO TABS: ORAL_TABLET | ORAL | 0 refills | Status: DC

## 2023-12-18 NOTE — Telephone Encounter (Signed)
 Name of Medication: Tramadol  Name of Pharmacy: [name] Last Fill or Written Date and Quantity: 11/20/23 #30 tab/ 0 refills  Last Office Visit and Type: 11/27/23 f/u Next Office Visit and Type: 05/26/24 f/u Last Controlled Substance Agreement Date:  Last UDS:

## 2024-01-22 ENCOUNTER — Other Ambulatory Visit: Payer: Self-pay | Admitting: Family Medicine

## 2024-01-23 ENCOUNTER — Ambulatory Visit
Admission: RE | Admit: 2024-01-23 | Discharge: 2024-01-23 | Disposition: A | Discharge status: Home / Self Care | Source: Ambulatory Visit | Attending: Family Medicine | Admitting: Family Medicine

## 2024-01-23 ENCOUNTER — Ambulatory Visit: Payer: Self-pay

## 2024-01-23 ENCOUNTER — Ambulatory Visit: Admitting: Family Medicine

## 2024-01-23 ENCOUNTER — Encounter: Payer: Self-pay | Admitting: Family Medicine

## 2024-01-23 VITALS — BP 120/60 | HR 72 | Temp 97.5°F | Ht 67.0 in | Wt 213.1 lb

## 2024-01-23 DIAGNOSIS — G8929 Other chronic pain: Secondary | ICD-10-CM

## 2024-01-23 DIAGNOSIS — M7061 Trochanteric bursitis, right hip: Secondary | ICD-10-CM

## 2024-01-23 DIAGNOSIS — M545 Low back pain, unspecified: Secondary | ICD-10-CM | POA: Diagnosis not present

## 2024-01-23 DIAGNOSIS — M25551 Pain in right hip: Secondary | ICD-10-CM

## 2024-01-23 MED ORDER — PREDNISONE 20 MG PO TABS: ORAL_TABLET | ORAL | 0 refills | Status: AC

## 2024-01-23 MED ORDER — TRIAMCINOLONE ACETONIDE 40 MG/ML IJ SUSP
40.0000 mg | Freq: Once | INTRAMUSCULAR | Status: AC
  Administered 2024-01-23: 40 mg via INTRA_ARTICULAR

## 2024-01-23 NOTE — Telephone Encounter (Signed)
 Aware, will watch for correspondence Thanks to Dr Watt for seeing her

## 2024-01-23 NOTE — Telephone Encounter (Signed)
 FYI Only or Action Required?: FYI only for provider: appointment scheduled on 01/23/24.  Patient was last seen in primary care on 11/27/2023 by Randeen Laine LABOR, MD.  Called Nurse Triage reporting Hip Pain.  Symptoms began 2 days ago.  Interventions attempted: OTC medications: Tylenol, Voltaren gel and Prescription medications: Tramadol .  Symptoms are: gradually worsening.  Triage Disposition: See PCP When Office is Open (Within 3 Days)  Patient/caregiver understands and will follow disposition?: Yes             Copied from CRM #[id number]. Topic: Clinical - Red Word Triage >> Jan 23, 2024  9:00 AM Rea ORN wrote: Red Word that prompted transfer to Nurse Triage: Right hip pain. Pt believes it is a bursitis flair again. The pain has kept her up the past 2 nights. Reason for Disposition  [1] MODERATE pain (e.g., interferes with normal activities, limping) AND [2] present > 3 days  Answer Assessment - Initial Assessment Questions 1. LOCATION and RADIATION: Where is the pain located? Does the pain spread (shoot) anywhere else?     Right hip and shoots down to right knee.  2. QUALITY: What does the pain feel like?  (e.g., sharp, dull, aching, burning)     Aching mostly, burns some.  3. SEVERITY: How bad is the pain? What does it keep you from doing?   (Scale 1-10; or mild, moderate, severe)     4/10 at present. 10/10 last night.  4. ONSET: When did the pain start? Does it come and go, or is it there all the time?     Monday. Constant, worse at night.  5. WORK OR EXERCISE: Has there been any recent work or exercise that involved this part of the body?      No.  6. CAUSE: What do you think is causing the hip pain?      Bursitis.  7. AGGRAVATING FACTORS: What makes the hip pain worse? (e.g., walking, climbing stairs, running)     No.  8. OTHER SYMPTOMS: Do you have any other symptoms? (e.g., back pain, pain shooting down leg,  fever, rash)     No recent  injury, fever, redness or rash, urinary retention, bowel or bladder incontinence.  Protocols used: Hip Pain-A-AH

## 2024-01-23 NOTE — Progress Notes (Signed)
 "    [place]field T. Destinee Taber, MD, CAQ Sports Medicine [facility] [address] [place], [address]  Phone: [phone]  FAX: [phone]  Amber Miles - 79 y.o. female  MRN [id number]  Date of Birth: 05-24-1945  Date: 01/23/2024  PCP: Randeen Laine LABOR, MD  Referral: Randeen Laine LABOR, MD  Chief Complaint  Patient presents with   Hip Pain    Right   Subjective:   Amber Miles is a 79 y.o. very pleasant female patient with Body mass index is 33.38 kg/m. who presents with the following:  Discussed the use of AI scribe software for clinical note transcription with the patient, who gave verbal consent to proceed.  History of Present Illness Amber Miles is a 79 year old female who presents with right hip pain radiating to the knee.  She experiences persistent pain in the right hip.  There have been no new injuries reported in the past few months. The pain is located on the side and higher up on the hip, with sore spots identified in these areas. - Significant lateral hip pain, and she does have a history of trochanteric bursitis on the right  The hip pain causes difficulty walking and limping, making it hard to perform activities such as raising a seat. It interferes with her ability to do exercises, as noted the previous night.  She has a history of receiving spinal injections, with the last one administered over a year ago.  This was done by Dr. Ibazebo, and he also has given her prior corticosteroid IM injection along with Toradol .  Her exercise routine includes laying back and lifting her legs, doing bicycle movements while lying on her back, and pressing her back against the bed. She performs these exercises every day except Sunday. Additionally, she engages in shoulder exercises, such as raising her arms and hanging on the doorframe, and foot exercises like rotating and kicking her feet.  No pain in the front of the hip but there is posterior  upper pelvic pain.    Review of Systems is noted in the HPI, as appropriate  Objective:   BP 120/60   Pulse 72   Temp (!) 97.5 F (36.4 C) (Temporal)   Ht 5' 7 (1.702 m)   Wt 213 lb 2 oz (96.7 kg)   SpO2 96%   BMI 33.38 kg/m   GEN: No acute distress; alert,appropriate. PULM: Breathing comfortably in no respiratory distress PSYCH: Normally interactive.   Modest tenderness from L3-S1 bilaterally and there is also some tenderness in posterior pelvis Mild tenderness in the region of the piriformis Sensation is grossly intact   HIP EXAM: SIDE: R ROM: Abduction, Flexion, Internal and External range of motion: Moderate restriction of motion in all directions Pain with terminal IROM and EROM: Minimal GTB: Notably tender to palpation along SLR: NEG Knees: No effusion FABER: NT REVERSE FABER: Tender Piriformis: NT at direct palpation Str: flexion: 4/5 abduction: 4/5 adduction: 4/5 Strength testing non-tender    Laboratory and Imaging Data:  Assessment and Plan:     ICD-10-CM   1. Trochanteric bursitis of right hip  M70.61 triamcinolone  acetonide (KENALOG -40) injection 40 mg    2. Chronic right hip pain  M25.551 DG HIP UNILAT WITH PELVIS 2-3 VIEWS RIGHT   G89.29     3. Low back pain of over 3 months duration  M54.50      Assessment & Plan Trochanteric bursitis of right hip Chronic right  hip pain, worsened by movement. Differential includes bursitis and back-related pain.  - I do not think that this is a hip degenerative arthritic issue - Ordered hip x-ray to evaluate hip joint. - Administered hip injection for pain relief.  I will give pleural pulses and oral steroids for her back.  Could consider ESI if symptoms persist by Dr. Iazebo again.  Aspiration/Injection Procedure Note Amber Miles 1945/04/26 Date of procedure: 01/23/2024  Procedure: Large Joint Aspiration / Injection of Hip, Trochanteric Bursa, R Indications: Pain  Procedure Details Verbal  consent obtained. Risks, benefits, and alternatives reviewed. Greater trochanter sterilely prepped with Chloraprep. Ethyl Chloride used for anesthesia. 9 cc of Lidocaine  1% injected with 1 mL of Kenalog  40 mg into trochanteric bursa at area of maximal tenderness at greater trochanter. Needle taken to bone to troch bursa, flows easily. Bursa massaged. No bleeding and no complications. Decreased pain after injection. Needle: 22 gauge spinal needle Medication: 1 mL of Kenalog  40 mg   Medication Management during today's office visit: Meds ordered this encounter  Medications   predniSONE  (DELTASONE ) 20 MG tablet    Sig: 2 tabs po for 4 days, then 1 tab po for 4 days    Dispense:  12 tablet    Refill:  0   triamcinolone  acetonide (KENALOG -40) injection 40 mg   Medications Discontinued During This Encounter  Medication Reason   levothyroxine (SYNTHROID) 112 MCG tablet Dose change    Orders placed today for conditions managed today: Orders Placed This Encounter  Procedures   DG HIP UNILAT WITH PELVIS 2-3 VIEWS RIGHT    Disposition: No follow-ups on file.  [employer] speech-to-text software was used for transcription in this dictation.  Possible transcriptional errors can occur using Animal nutritionist.   Signed,  Jacques DASEN. Tadeusz Stahl, MD   Outpatient Encounter Medications as of 01/23/2024  Medication Sig   acyclovir (ZOVIRAX) 400 MG tablet Take 400 mg by mouth 3 (three) times daily. X 3 days for flares   albuterol  (VENTOLIN  HFA) 108 (90 Base) MCG/ACT inhaler INHALE 1 TO 2 PUFFS EVERY 4 TO 6 HOURS AS NEEDED FOR WHEEZE OR COUGH   azelastine (ASTELIN) 0.1 % nasal spray Place 1 spray into both nostrils 2 (two) times daily.   cetirizine (ZYRTEC) 5 MG tablet Take 5 mg by mouth daily as needed.   Docusate Calcium (STOOL SOFTENER PO) Take 1 tablet by mouth daily as needed.   esomeprazole  (NEXIUM ) 40 MG capsule TAKE 1 CAPSULE BY MOUTH EVERY DAY BEFORE BREAKFAST   estradiol  (VIVELLE -DOT)  0.0375 MG/24HR PLACE 1 PATCH ONTO THE SKIN 2 TIMES A WEEK.   furosemide  (LASIX ) 20 MG tablet TAKE 1/2 TABLET BY MOUTH DAILY   GLUCOSAMINE-CHONDROITIN-VIT D3 PO Take 1 capsule by mouth 2 (two) times daily.   levothyroxine (SYNTHROID) 125 MCG tablet Take 125 mcg by mouth daily before breakfast.   losartan  (COZAAR ) 100 MG tablet Take 1 tablet (100 mg total) by mouth daily.   methocarbamol  (ROBAXIN ) 500 MG tablet TAKE 1 TABLET BY MOUTH THREE TIMES A DAY AS NEEDED FOR MUSCLE SPASM   montelukast (SINGULAIR) 10 MG tablet Take 10 mg by mouth daily.    Multiple Vitamin (MULTIVITAMIN) tablet Take 1 tablet by mouth daily.     potassium chloride  SA (KLOR-CON  M) 20 MEQ tablet TAKE 1 TABLET BY MOUTH EVERY DAY   predniSONE  (DELTASONE ) 20 MG tablet 2 tabs po for 4 days, then 1 tab po for 4 days   QVAR REDIHALER 80 MCG/ACT inhaler  Inhale 2 puffs into the lungs 2 (two) times daily.   triamterene -hydrochlorothiazide (MAXZIDE-25) 37.5-25 MG tablet Take 0.5 tablets by mouth daily.   verapamil  (CALAN -SR) 240 MG CR tablet TAKE 1 TABLET (240 MG TOTAL) BY MOUTH 2 (TWO) TIMES DAILY.   [DISCONTINUED] traMADol  (ULTRAM ) 50 MG tablet 1-2 TABS BY MOUTH EVERY 8 HOURS AS NEEDED FOR SEVERE(SHOULDER PAIN). CAUTION OF SEDATION (M25.519)   [DISCONTINUED] levothyroxine (SYNTHROID) 112 MCG tablet Take 112 mcg by mouth daily before breakfast.   [EXPIRED] triamcinolone  acetonide (KENALOG -40) injection 40 mg    No facility-administered encounter medications on file as of 01/23/2024.   "

## 2024-01-23 NOTE — Telephone Encounter (Signed)
 Name of Medication: Tramadol  Name of Pharmacy: [name] Last Fill or Written Date and Quantity: 12/18/23 #30 tab/ 0 refills  Last Office Visit and Type: 11/27/23 f/u (today with Dr. Watt) Next Office Visit and Type: 05/26/24 f/u

## 2024-01-24 ENCOUNTER — Encounter: Payer: Self-pay | Admitting: Family Medicine

## 2024-01-31 ENCOUNTER — Ambulatory Visit: Payer: Self-pay | Admitting: Family Medicine

## 2024-02-02 ENCOUNTER — Other Ambulatory Visit: Payer: Self-pay | Admitting: Family Medicine

## 2024-05-26 ENCOUNTER — Ambulatory Visit: Admitting: Family Medicine

## 2024-08-11 ENCOUNTER — Ambulatory Visit
